# Patient Record
Sex: Female | Born: 1935 | Race: White | Hispanic: No | Smoking: Former smoker
Health system: Southern US, Community
[De-identification: ages and names within clinical notes are randomized; demographics above are authoritative.]

## PROBLEM LIST (undated history)

## (undated) DIAGNOSIS — I129 Hypertensive chronic kidney disease with stage 1 through stage 4 chronic kidney disease, or unspecified chronic kidney disease: Secondary | ICD-10-CM

## (undated) DIAGNOSIS — I10 Essential (primary) hypertension: Secondary | ICD-10-CM

## (undated) DIAGNOSIS — D649 Anemia, unspecified: Secondary | ICD-10-CM

## (undated) DIAGNOSIS — E041 Nontoxic single thyroid nodule: Secondary | ICD-10-CM

## (undated) DIAGNOSIS — J449 Chronic obstructive pulmonary disease, unspecified: Secondary | ICD-10-CM

## (undated) DIAGNOSIS — I679 Cerebrovascular disease, unspecified: Secondary | ICD-10-CM

## (undated) DIAGNOSIS — J9610 Chronic respiratory failure, unspecified whether with hypoxia or hypercapnia: Secondary | ICD-10-CM

## (undated) DIAGNOSIS — I2510 Atherosclerotic heart disease of native coronary artery without angina pectoris: Secondary | ICD-10-CM

## (undated) DIAGNOSIS — I779 Disorder of arteries and arterioles, unspecified: Secondary | ICD-10-CM

## (undated) DIAGNOSIS — Z86010 Personal history of colonic polyps: Secondary | ICD-10-CM

## (undated) DIAGNOSIS — E785 Hyperlipidemia, unspecified: Secondary | ICD-10-CM

## (undated) DIAGNOSIS — K449 Diaphragmatic hernia without obstruction or gangrene: Secondary | ICD-10-CM

## (undated) DIAGNOSIS — I255 Ischemic cardiomyopathy: Secondary | ICD-10-CM

## (undated) DIAGNOSIS — I480 Paroxysmal atrial fibrillation: Secondary | ICD-10-CM

## (undated) DIAGNOSIS — I340 Nonrheumatic mitral (valve) insufficiency: Secondary | ICD-10-CM

## (undated) DIAGNOSIS — I5022 Chronic systolic (congestive) heart failure: Secondary | ICD-10-CM

## (undated) DIAGNOSIS — I719 Aortic aneurysm of unspecified site, without rupture: Secondary | ICD-10-CM

## (undated) DIAGNOSIS — H269 Unspecified cataract: Secondary | ICD-10-CM

## (undated) DIAGNOSIS — I110 Hypertensive heart disease with heart failure: Secondary | ICD-10-CM

## (undated) DIAGNOSIS — Z720 Tobacco use: Secondary | ICD-10-CM

## (undated) DIAGNOSIS — J479 Bronchiectasis, uncomplicated: Secondary | ICD-10-CM

## (undated) DIAGNOSIS — I517 Cardiomegaly: Secondary | ICD-10-CM

## (undated) DIAGNOSIS — N184 Chronic kidney disease, stage 4 (severe): Secondary | ICD-10-CM

## (undated) DIAGNOSIS — I700 Atherosclerosis of aorta: Secondary | ICD-10-CM

## (undated) HISTORY — DX: Hypertensive heart disease with heart failure: I11.0

## (undated) HISTORY — DX: Aortic aneurysm of unspecified site, without rupture: I71.9

## (undated) HISTORY — DX: Nonrheumatic mitral (valve) insufficiency: I34.0

## (undated) HISTORY — PX: OOPHORECTOMY: SHX86

## (undated) HISTORY — DX: Anemia, unspecified: D64.9

## (undated) HISTORY — DX: Cerebrovascular disease, unspecified: I67.9

## (undated) HISTORY — DX: Bronchiectasis, uncomplicated: J47.9

## (undated) HISTORY — DX: Atherosclerosis of aorta: I70.0

## (undated) HISTORY — DX: Atherosclerotic heart disease of native coronary artery without angina pectoris: I25.10

## (undated) HISTORY — DX: Essential (primary) hypertension: I10

## (undated) HISTORY — DX: Cardiomegaly: I51.7

## (undated) HISTORY — DX: Chronic obstructive pulmonary disease, unspecified: J44.9

## (undated) HISTORY — DX: Chronic kidney disease, stage 4 (severe): N18.4

## (undated) HISTORY — DX: Tobacco use: Z72.0

## (undated) HISTORY — PX: APPENDECTOMY: SHX54

## (undated) HISTORY — DX: Disorder of arteries and arterioles, unspecified: I77.9

## (undated) HISTORY — DX: Paroxysmal atrial fibrillation: I48.0

## (undated) HISTORY — PX: HEMORRHOID SURGERY: SHX153

## (undated) HISTORY — DX: Chronic systolic (congestive) heart failure: I50.22

## (undated) HISTORY — DX: Ischemic cardiomyopathy: I25.5

## (undated) HISTORY — DX: Chronic respiratory failure, unspecified whether with hypoxia or hypercapnia: J96.10

## (undated) HISTORY — PX: OTHER SURGICAL HISTORY: SHX169

## (undated) HISTORY — DX: Hyperlipidemia, unspecified: E78.5

## (undated) HISTORY — DX: Personal history of colonic polyps: Z86.010

## (undated) HISTORY — DX: Diaphragmatic hernia without obstruction or gangrene: K44.9

## (undated) HISTORY — DX: Hypertensive chronic kidney disease with stage 1 through stage 4 chronic kidney disease, or unspecified chronic kidney disease: I12.9

---

## 2004-10-17 ENCOUNTER — Emergency Department (HOSPITAL_COMMUNITY): Admission: EM | Admit: 2004-10-17 | Discharge: 2004-10-17 | Payer: Self-pay | Admitting: Family Medicine

## 2008-02-16 ENCOUNTER — Emergency Department (HOSPITAL_COMMUNITY): Admission: EM | Admit: 2008-02-16 | Discharge: 2008-02-16 | Payer: Self-pay | Admitting: Family Medicine

## 2008-07-12 HISTORY — PX: CORONARY STENT PLACEMENT: SHX1402

## 2008-07-30 ENCOUNTER — Ambulatory Visit: Payer: Self-pay | Admitting: Internal Medicine

## 2008-07-30 ENCOUNTER — Inpatient Hospital Stay (HOSPITAL_COMMUNITY): Admission: AD | Admit: 2008-07-30 | Discharge: 2008-08-03 | Payer: Self-pay | Admitting: Cardiology

## 2008-07-31 ENCOUNTER — Encounter: Payer: Self-pay | Admitting: Cardiology

## 2008-08-01 ENCOUNTER — Encounter: Payer: Self-pay | Admitting: Cardiology

## 2008-08-02 ENCOUNTER — Ambulatory Visit: Payer: Self-pay | Admitting: Thoracic Surgery (Cardiothoracic Vascular Surgery)

## 2008-08-02 ENCOUNTER — Encounter: Payer: Self-pay | Admitting: Cardiology

## 2008-08-11 DIAGNOSIS — I10 Essential (primary) hypertension: Secondary | ICD-10-CM | POA: Insufficient documentation

## 2008-08-11 DIAGNOSIS — F17200 Nicotine dependence, unspecified, uncomplicated: Secondary | ICD-10-CM

## 2008-08-22 ENCOUNTER — Encounter: Payer: Self-pay | Admitting: Physician Assistant

## 2008-08-22 ENCOUNTER — Ambulatory Visit: Payer: Self-pay | Admitting: Cardiology

## 2008-08-22 DIAGNOSIS — E041 Nontoxic single thyroid nodule: Secondary | ICD-10-CM

## 2008-08-22 DIAGNOSIS — I080 Rheumatic disorders of both mitral and aortic valves: Secondary | ICD-10-CM | POA: Insufficient documentation

## 2008-09-07 ENCOUNTER — Ambulatory Visit: Payer: Self-pay | Admitting: Thoracic Surgery (Cardiothoracic Vascular Surgery)

## 2008-09-12 HISTORY — PX: CORONARY ARTERY BYPASS GRAFT: SHX141

## 2008-09-14 ENCOUNTER — Telehealth: Payer: Self-pay | Admitting: Cardiology

## 2008-09-24 ENCOUNTER — Encounter: Payer: Self-pay | Admitting: Thoracic Surgery (Cardiothoracic Vascular Surgery)

## 2008-09-24 ENCOUNTER — Ambulatory Visit (HOSPITAL_COMMUNITY)
Admission: RE | Admit: 2008-09-24 | Discharge: 2008-09-24 | Payer: Self-pay | Admitting: Thoracic Surgery (Cardiothoracic Vascular Surgery)

## 2008-09-26 ENCOUNTER — Ambulatory Visit: Payer: Self-pay | Admitting: Gastroenterology

## 2008-09-26 ENCOUNTER — Inpatient Hospital Stay (HOSPITAL_COMMUNITY)
Admission: RE | Admit: 2008-09-26 | Discharge: 2008-10-12 | Payer: Self-pay | Admitting: Thoracic Surgery (Cardiothoracic Vascular Surgery)

## 2008-09-26 ENCOUNTER — Encounter: Payer: Self-pay | Admitting: Thoracic Surgery (Cardiothoracic Vascular Surgery)

## 2008-09-26 ENCOUNTER — Ambulatory Visit: Payer: Self-pay | Admitting: Cardiology

## 2008-09-26 ENCOUNTER — Ambulatory Visit: Payer: Self-pay | Admitting: Thoracic Surgery (Cardiothoracic Vascular Surgery)

## 2008-10-02 ENCOUNTER — Encounter: Payer: Self-pay | Admitting: Thoracic Surgery (Cardiothoracic Vascular Surgery)

## 2008-10-04 ENCOUNTER — Encounter: Payer: Self-pay | Admitting: Gastroenterology

## 2008-10-31 ENCOUNTER — Inpatient Hospital Stay (HOSPITAL_COMMUNITY): Admission: EM | Admit: 2008-10-31 | Discharge: 2008-11-03 | Payer: Self-pay | Admitting: Emergency Medicine

## 2008-11-01 ENCOUNTER — Ambulatory Visit: Payer: Self-pay | Admitting: Internal Medicine

## 2008-11-01 ENCOUNTER — Encounter (INDEPENDENT_AMBULATORY_CARE_PROVIDER_SITE_OTHER): Payer: Self-pay | Admitting: Diagnostic Radiology

## 2008-11-01 ENCOUNTER — Telehealth: Payer: Self-pay | Admitting: Cardiology

## 2008-11-01 ENCOUNTER — Encounter (HOSPITAL_COMMUNITY): Payer: Self-pay | Admitting: Emergency Medicine

## 2008-11-01 ENCOUNTER — Encounter: Payer: Self-pay | Admitting: Cardiology

## 2008-11-02 ENCOUNTER — Encounter (INDEPENDENT_AMBULATORY_CARE_PROVIDER_SITE_OTHER): Payer: Self-pay | Admitting: *Deleted

## 2008-11-02 ENCOUNTER — Encounter: Payer: Self-pay | Admitting: Cardiology

## 2008-11-03 ENCOUNTER — Encounter: Payer: Self-pay | Admitting: Cardiology

## 2008-11-06 ENCOUNTER — Telehealth: Payer: Self-pay | Admitting: Cardiology

## 2008-11-09 ENCOUNTER — Emergency Department (HOSPITAL_COMMUNITY): Admission: EM | Admit: 2008-11-09 | Discharge: 2008-11-09 | Payer: Self-pay | Admitting: Emergency Medicine

## 2008-11-12 ENCOUNTER — Telehealth: Payer: Self-pay | Admitting: Gastroenterology

## 2008-11-12 ENCOUNTER — Emergency Department (HOSPITAL_COMMUNITY): Admission: EM | Admit: 2008-11-12 | Discharge: 2008-11-12 | Payer: Self-pay | Admitting: Emergency Medicine

## 2008-11-15 ENCOUNTER — Encounter: Payer: Self-pay | Admitting: Cardiology

## 2008-11-23 ENCOUNTER — Encounter (INDEPENDENT_AMBULATORY_CARE_PROVIDER_SITE_OTHER): Payer: Self-pay | Admitting: *Deleted

## 2008-11-23 ENCOUNTER — Encounter: Payer: Self-pay | Admitting: Cardiology

## 2008-11-28 ENCOUNTER — Ambulatory Visit: Payer: Self-pay | Admitting: Cardiovascular Disease

## 2008-11-28 DIAGNOSIS — J90 Pleural effusion, not elsewhere classified: Secondary | ICD-10-CM | POA: Insufficient documentation

## 2008-12-05 ENCOUNTER — Encounter: Payer: Self-pay | Admitting: Cardiology

## 2008-12-05 ENCOUNTER — Other Ambulatory Visit: Admission: RE | Admit: 2008-12-05 | Discharge: 2008-12-05 | Payer: Self-pay | Admitting: Interventional Radiology

## 2008-12-05 ENCOUNTER — Encounter: Admission: RE | Admit: 2008-12-05 | Discharge: 2008-12-05 | Payer: Self-pay | Admitting: Cardiology

## 2008-12-10 ENCOUNTER — Encounter: Payer: Self-pay | Admitting: Cardiology

## 2008-12-20 ENCOUNTER — Telehealth: Payer: Self-pay | Admitting: Cardiology

## 2008-12-28 ENCOUNTER — Telehealth: Payer: Self-pay | Admitting: Cardiology

## 2009-01-28 ENCOUNTER — Ambulatory Visit: Payer: Self-pay | Admitting: Cardiology

## 2009-01-29 ENCOUNTER — Encounter: Payer: Self-pay | Admitting: Cardiology

## 2009-01-30 LAB — CONVERTED CEMR LAB
ALT: 12 units/L (ref 0–35)
AST: 20 units/L (ref 0–37)
Albumin: 3.5 g/dL (ref 3.5–5.2)
Alkaline Phosphatase: 69 units/L (ref 39–117)
BUN: 32 mg/dL — ABNORMAL HIGH (ref 6–23)
Basophils Absolute: 0 10*3/uL (ref 0.0–0.1)
CO2: 25 meq/L (ref 19–32)
Chloride: 106 meq/L (ref 96–112)
Eosinophils Relative: 2 % (ref 0–5)
GFR calc non Af Amer: 38.48 mL/min (ref >60)
Glucose, Bld: 86 mg/dL (ref 70–99)
Lymphocytes Relative: 30 % (ref 12–46)
Lymphs Abs: 2.1 10*3/uL (ref 0.7–4.0)
Neutro Abs: 4.2 10*3/uL (ref 1.7–7.7)
Neutrophils Relative %: 60 % (ref 43–77)
Platelets: 524 10*3/uL — ABNORMAL HIGH (ref 150–400)
Potassium: 5.1 meq/L (ref 3.5–5.1)
RDW: 14.9 % (ref 11.5–15.5)
Sodium: 143 meq/L (ref 135–145)
Total Protein: 7.3 g/dL (ref 6.0–8.3)
WBC: 7 10*3/uL (ref 4.0–10.5)

## 2009-02-01 ENCOUNTER — Ambulatory Visit: Payer: Self-pay | Admitting: Cardiology

## 2009-02-01 ENCOUNTER — Ambulatory Visit: Payer: Self-pay

## 2009-02-04 ENCOUNTER — Ambulatory Visit: Payer: Self-pay | Admitting: Cardiology

## 2009-02-05 ENCOUNTER — Encounter: Payer: Self-pay | Admitting: Cardiovascular Disease

## 2009-02-06 ENCOUNTER — Telehealth (INDEPENDENT_AMBULATORY_CARE_PROVIDER_SITE_OTHER): Payer: Self-pay | Admitting: *Deleted

## 2009-02-07 LAB — CONVERTED CEMR LAB
Basophils Absolute: 0 10*3/uL (ref 0.0–0.1)
Basophils Relative: 0 % (ref 0–1)
CO2: 24 meq/L (ref 19–32)
Calcium: 9.3 mg/dL (ref 8.4–10.5)
Hemoglobin: 11.6 g/dL — ABNORMAL LOW (ref 12.0–15.0)
Lymphocytes Relative: 31 % (ref 12–46)
MCHC: 32 g/dL (ref 30.0–36.0)
Monocytes Absolute: 0.5 10*3/uL (ref 0.1–1.0)
Monocytes Relative: 6 % (ref 3–12)
Neutro Abs: 5.3 10*3/uL (ref 1.7–7.7)
Neutrophils Relative %: 62 % (ref 43–77)
OCCULT 1: NEGATIVE
OCCULT 3: NEGATIVE
OCCULT 4: NEGATIVE
OCCULT 5: NEGATIVE
Potassium: 5.2 meq/L (ref 3.5–5.3)
RBC: 4.09 M/uL (ref 3.87–5.11)
Sodium: 139 meq/L (ref 135–145)

## 2009-02-08 ENCOUNTER — Encounter: Payer: Self-pay | Admitting: Cardiovascular Disease

## 2009-02-19 ENCOUNTER — Ambulatory Visit: Payer: Self-pay | Admitting: Cardiology

## 2009-02-19 DIAGNOSIS — E785 Hyperlipidemia, unspecified: Secondary | ICD-10-CM

## 2009-02-19 LAB — CONVERTED CEMR LAB
ALT: 13 units/L (ref 0–35)
Albumin: 3.7 g/dL (ref 3.5–5.2)
BUN: 20 mg/dL (ref 6–23)
Basophils Absolute: 0 10*3/uL (ref 0.0–0.1)
Basophils Relative: 0.2 % (ref 0.0–3.0)
CO2: 30 meq/L (ref 19–32)
Calcium: 9.6 mg/dL (ref 8.4–10.5)
Chloride: 108 meq/L (ref 96–112)
Cholesterol: 170 mg/dL (ref 0–200)
Creatinine, Ser: 1.2 mg/dL (ref 0.4–1.2)
Eosinophils Absolute: 0.2 10*3/uL (ref 0.0–0.7)
Glucose, Bld: 97 mg/dL (ref 70–99)
HDL: 71.3 mg/dL (ref >39.00)
MCHC: 32.9 g/dL (ref 30.0–36.0)
MCV: 90 fL (ref 78.0–100.0)
Monocytes Absolute: 0.7 10*3/uL (ref 0.1–1.0)
Neutrophils Relative %: 59.3 % (ref 43.0–77.0)
Platelets: 307 10*3/uL (ref 150.0–400.0)
RBC: 4.14 M/uL (ref 3.87–5.11)
RDW: 15.6 % — ABNORMAL HIGH (ref 11.5–14.6)
Total Protein: 6.7 g/dL (ref 6.0–8.3)
Triglycerides: 134 mg/dL (ref 0.0–149.0)

## 2009-02-20 ENCOUNTER — Ambulatory Visit: Payer: Self-pay | Admitting: Cardiology

## 2009-02-22 ENCOUNTER — Telehealth: Payer: Self-pay | Admitting: Cardiology

## 2009-02-22 LAB — CONVERTED CEMR LAB
BUN: 20 mg/dL (ref 6–23)
GFR calc non Af Amer: 45.96 mL/min (ref >60)
Potassium: 5.2 meq/L — ABNORMAL HIGH (ref 3.5–5.1)

## 2009-02-26 ENCOUNTER — Ambulatory Visit: Payer: Self-pay | Admitting: Cardiovascular Disease

## 2009-02-26 DIAGNOSIS — I70219 Atherosclerosis of native arteries of extremities with intermittent claudication, unspecified extremity: Secondary | ICD-10-CM

## 2009-02-28 LAB — CONVERTED CEMR LAB
CO2: 30 meq/L (ref 19–32)
Calcium: 9.5 mg/dL (ref 8.4–10.5)
GFR calc non Af Amer: 38.47 mL/min (ref >60)
Potassium: 4.8 meq/L (ref 3.5–5.1)
Sodium: 139 meq/L (ref 135–145)

## 2009-03-15 ENCOUNTER — Ambulatory Visit: Payer: Self-pay | Admitting: Cardiology

## 2009-03-22 LAB — CONVERTED CEMR LAB
BUN: 26 mg/dL — ABNORMAL HIGH (ref 6–23)
Chloride: 105 meq/L (ref 96–112)
Potassium: 4.4 meq/L (ref 3.5–5.1)

## 2009-04-22 ENCOUNTER — Ambulatory Visit: Payer: Self-pay | Admitting: Cardiology

## 2009-04-22 DIAGNOSIS — I2510 Atherosclerotic heart disease of native coronary artery without angina pectoris: Secondary | ICD-10-CM | POA: Insufficient documentation

## 2009-04-23 LAB — CONVERTED CEMR LAB
AST: 32 units/L (ref 0–37)
Albumin: 4.1 g/dL (ref 3.5–5.2)
Alkaline Phosphatase: 58 units/L (ref 39–117)
CO2: 31 meq/L (ref 19–32)
Glucose, Bld: 97 mg/dL (ref 70–99)
Potassium: 5.8 meq/L — ABNORMAL HIGH (ref 3.5–5.1)
Sodium: 142 meq/L (ref 135–145)
TSH: 1.98 microintl units/mL (ref 0.35–5.50)
Total CK: 68 units/L (ref 7–177)
Total Protein: 7.2 g/dL (ref 6.0–8.3)

## 2009-04-24 ENCOUNTER — Ambulatory Visit: Payer: Self-pay | Admitting: Cardiology

## 2009-04-27 ENCOUNTER — Ambulatory Visit: Payer: Self-pay | Admitting: Cardiovascular Disease

## 2009-04-27 ENCOUNTER — Encounter: Payer: Self-pay | Admitting: Cardiovascular Disease

## 2009-04-27 ENCOUNTER — Inpatient Hospital Stay (HOSPITAL_COMMUNITY): Admission: EM | Admit: 2009-04-27 | Discharge: 2009-04-30 | Payer: Self-pay | Admitting: Emergency Medicine

## 2009-04-27 ENCOUNTER — Ambulatory Visit: Payer: Self-pay | Admitting: Cardiology

## 2009-04-28 LAB — CONVERTED CEMR LAB
Chloride: 104 meq/L (ref 96–112)
Creatinine, Ser: 1.3 mg/dL — ABNORMAL HIGH (ref 0.4–1.2)
Potassium: 5.1 meq/L (ref 3.5–5.1)

## 2009-05-08 ENCOUNTER — Ambulatory Visit: Payer: Self-pay | Admitting: Cardiology

## 2009-05-09 LAB — CONVERTED CEMR LAB
CO2: 32 meq/L (ref 19–32)
GFR calc non Af Amer: 41.88 mL/min (ref >60)
Glucose, Bld: 96 mg/dL (ref 70–99)
Potassium: 5.4 meq/L — ABNORMAL HIGH (ref 3.5–5.1)
Sodium: 142 meq/L (ref 135–145)

## 2009-05-13 ENCOUNTER — Ambulatory Visit: Payer: Self-pay | Admitting: Cardiology

## 2009-05-16 LAB — CONVERTED CEMR LAB
BUN: 26 mg/dL — ABNORMAL HIGH (ref 6–23)
Chloride: 106 meq/L (ref 96–112)
Potassium: 4.9 meq/L (ref 3.5–5.1)

## 2009-05-27 ENCOUNTER — Ambulatory Visit: Payer: Self-pay | Admitting: Cardiology

## 2009-05-29 LAB — CONVERTED CEMR LAB
CO2: 31 meq/L (ref 19–32)
Calcium: 9.2 mg/dL (ref 8.4–10.5)
Chloride: 105 meq/L (ref 96–112)
Glucose, Bld: 84 mg/dL (ref 70–99)
Sodium: 142 meq/L (ref 135–145)

## 2009-06-12 ENCOUNTER — Encounter: Payer: Self-pay | Admitting: Cardiology

## 2009-06-12 DIAGNOSIS — I739 Peripheral vascular disease, unspecified: Secondary | ICD-10-CM

## 2009-07-26 ENCOUNTER — Ambulatory Visit: Payer: Self-pay | Admitting: Cardiology

## 2009-07-26 ENCOUNTER — Ambulatory Visit: Payer: Self-pay

## 2009-07-26 DIAGNOSIS — I359 Nonrheumatic aortic valve disorder, unspecified: Secondary | ICD-10-CM

## 2009-08-02 LAB — CONVERTED CEMR LAB
BUN: 28 mg/dL — ABNORMAL HIGH (ref 6–23)
CO2: 28 meq/L (ref 19–32)
Chloride: 103 meq/L (ref 96–112)
Creatinine, Ser: 1.34 mg/dL — ABNORMAL HIGH (ref 0.40–1.20)
Glucose, Bld: 94 mg/dL (ref 70–99)

## 2009-08-06 ENCOUNTER — Encounter: Payer: Self-pay | Admitting: Cardiology

## 2009-08-09 ENCOUNTER — Ambulatory Visit: Payer: Self-pay | Admitting: Cardiology

## 2009-08-16 ENCOUNTER — Telehealth: Payer: Self-pay | Admitting: Cardiology

## 2009-08-16 LAB — CONVERTED CEMR LAB
BUN: 34 mg/dL — ABNORMAL HIGH (ref 6–23)
Calcium: 9.1 mg/dL (ref 8.4–10.5)
Creatinine, Ser: 1.4 mg/dL — ABNORMAL HIGH (ref 0.4–1.2)
GFR calc non Af Amer: 39.73 mL/min (ref >60)
Potassium: 4.5 meq/L (ref 3.5–5.1)

## 2009-08-22 ENCOUNTER — Ambulatory Visit: Payer: Self-pay | Admitting: Cardiology

## 2009-08-22 ENCOUNTER — Ambulatory Visit: Payer: Self-pay | Admitting: Vascular Surgery

## 2009-08-26 LAB — CONVERTED CEMR LAB
Calcium: 9.7 mg/dL (ref 8.4–10.5)
Creatinine, Ser: 1.4 mg/dL — ABNORMAL HIGH (ref 0.4–1.2)
GFR calc non Af Amer: 38.74 mL/min (ref >60)
Sodium: 144 meq/L (ref 135–145)

## 2009-10-22 ENCOUNTER — Ambulatory Visit: Payer: Self-pay | Admitting: Cardiology

## 2009-10-23 ENCOUNTER — Encounter: Payer: Self-pay | Admitting: Cardiology

## 2009-10-24 LAB — CONVERTED CEMR LAB
Chloride: 103 meq/L (ref 96–112)
GFR calc non Af Amer: 18.15 mL/min (ref >60)
Potassium: 4.9 meq/L (ref 3.5–5.1)
Sodium: 137 meq/L (ref 135–145)

## 2009-10-25 ENCOUNTER — Ambulatory Visit: Payer: Self-pay | Admitting: Cardiology

## 2009-10-25 LAB — CONVERTED CEMR LAB
CO2: 23 meq/L (ref 19–32)
Glucose, Bld: 91 mg/dL (ref 70–99)
Ketones, ur: NEGATIVE mg/dL
Potassium: 4.5 meq/L (ref 3.5–5.1)
Sodium: 139 meq/L (ref 135–145)
Specific Gravity, Urine: 1.015 (ref 1.000–1.030)
Urine Glucose: NEGATIVE mg/dL
Urobilinogen, UA: 0.2 (ref 0.0–1.0)
pH: 5.5 (ref 5.0–8.0)

## 2009-10-30 ENCOUNTER — Ambulatory Visit: Payer: Self-pay | Admitting: Cardiology

## 2009-10-30 ENCOUNTER — Ambulatory Visit: Payer: Self-pay

## 2009-11-04 ENCOUNTER — Encounter: Payer: Self-pay | Admitting: Cardiology

## 2009-11-06 LAB — CONVERTED CEMR LAB
BUN: 29 mg/dL — ABNORMAL HIGH (ref 6–23)
Calcium: 9.5 mg/dL (ref 8.4–10.5)
GFR calc non Af Amer: 20.23 mL/min (ref >60)
Glucose, Bld: 89 mg/dL (ref 70–99)
Potassium: 4.7 meq/L (ref 3.5–5.1)

## 2009-11-15 ENCOUNTER — Encounter (HOSPITAL_COMMUNITY)
Admission: RE | Admit: 2009-11-15 | Discharge: 2010-02-11 | Payer: Self-pay | Source: Home / Self Care | Attending: Nephrology | Admitting: Nephrology

## 2009-11-15 ENCOUNTER — Ambulatory Visit: Payer: Self-pay | Admitting: Internal Medicine

## 2009-11-16 ENCOUNTER — Inpatient Hospital Stay (HOSPITAL_COMMUNITY): Admission: EM | Admit: 2009-11-16 | Discharge: 2009-11-19 | Payer: Self-pay | Admitting: Emergency Medicine

## 2009-11-29 ENCOUNTER — Ambulatory Visit: Payer: Self-pay | Admitting: Cardiology

## 2009-11-29 ENCOUNTER — Encounter: Payer: Self-pay | Admitting: Physician Assistant

## 2009-11-29 ENCOUNTER — Ambulatory Visit: Payer: Self-pay | Admitting: Physician Assistant

## 2009-11-29 DIAGNOSIS — I5042 Chronic combined systolic (congestive) and diastolic (congestive) heart failure: Secondary | ICD-10-CM | POA: Insufficient documentation

## 2009-12-02 LAB — CONVERTED CEMR LAB
CO2: 28 meq/L (ref 19–32)
Calcium: 9.7 mg/dL (ref 8.4–10.5)
Creatinine, Ser: 2.3 mg/dL — ABNORMAL HIGH (ref 0.4–1.2)
GFR calc non Af Amer: 21.22 mL/min (ref >60)
Sodium: 142 meq/L (ref 135–145)

## 2009-12-17 ENCOUNTER — Telehealth (INDEPENDENT_AMBULATORY_CARE_PROVIDER_SITE_OTHER): Payer: Self-pay | Admitting: *Deleted

## 2009-12-19 ENCOUNTER — Encounter: Payer: Self-pay | Admitting: Cardiology

## 2010-01-27 LAB — POCT HEMOGLOBIN-HEMACUE: Hemoglobin: 11.8 g/dL — ABNORMAL LOW (ref 12.0–15.0)

## 2010-01-28 ENCOUNTER — Ambulatory Visit
Admission: RE | Admit: 2010-01-28 | Discharge: 2010-01-28 | Payer: Self-pay | Source: Home / Self Care | Attending: Cardiology | Admitting: Cardiology

## 2010-02-02 ENCOUNTER — Encounter: Payer: Self-pay | Admitting: Thoracic Surgery (Cardiothoracic Vascular Surgery)

## 2010-02-11 NOTE — Miscellaneous (Signed)
Summary: Orders Update  Clinical Lists Changes  Orders: Added new Test order of T-2 View CXR (71020TC) - Signed 

## 2010-02-11 NOTE — Progress Notes (Signed)
Summary: Results  Phone Note Outgoing Call   Call placed by: Julieta Gutting, RN, BSN,  August 16, 2009 5:44 PM Call placed to: Patient Summary of Call: I spoke with the pt and made her aware of Carotid and BMP results.   The pt cannot cut her furosemide tablets because they are to small.  I instructed the pt to take her Furosemide 20mg  every third day and recheck BMP on 08/23/09.  The pt will also need referral to [facility] for carotid stenosis. Initial call taken by: Julieta Gutting, RN, BSN,  August 16, 2009 5:44 PM    New/Updated Medications: FUROSEMIDE 20 MG TABS (FUROSEMIDE) Take one tablet by mouth every third day

## 2010-02-11 NOTE — Letter (Signed)
Summary: [facility] Physician Order  [facility] Physician Order   Imported By: Roderic Ovens 11/23/2008 11:46:31  _____________________________________________________________________  External Attachment:    Type:   Image     Comment:   External Document

## 2010-02-11 NOTE — Progress Notes (Signed)
Summary: refill  Phone Note Refill Request Message from:  Patient on December 17, 2009 1:46 PM  Refills Requested: Medication #1:  LASIX 20 MG TABS Take 1 tablet by mouth once a day [facility]  Initial call taken by: Judie Grieve,  December 17, 2009 1:46 PM  Follow-up for Phone Call        Rx faxed to pharmacy. Vikki Ports  December 18, 2009 8:25 AM     Prescriptions: LASIX 20 MG TABS (FUROSEMIDE) Take 1 tablet by mouth once a day  #30 x 6   Entered by:   Vikki Ports   Authorized by:   Ronaldo Miyamoto, MD, [facility]   Signed by:   Vikki Ports on 12/18/2009   Method used:   Faxed to ...       [facility]       [address]       [place], [place]  [address]       Ph: [phone]       Fax: [phone]   RxID:   [id number]

## 2010-02-11 NOTE — Miscellaneous (Signed)
Summary: Orders Update  Clinical Lists Changes  Orders: Added new Test order of T-Basic Metabolic Panel ([id number]) - Signed Added new Test order of T-CBC w/Diff ([id number]) - Signed 

## 2010-02-11 NOTE — Letter (Signed)
Summary: [facility]  [facility]   Imported By: Marylou Mccoy 03/27/2009 09:28:46  _____________________________________________________________________  External Attachment:    Type:   Image     Comment:   External Document

## 2010-02-11 NOTE — Miscellaneous (Signed)
Summary: Orders Update  Clinical Lists Changes  Problems: Added new problem of RENAL INSUFFICIENCY (ICD-588.9) Orders: Added new Test order of Renal Artery Duplex (Renal Artery Duplex) - Signed 

## 2010-02-11 NOTE — Assessment & Plan Note (Signed)
Summary: abnormal LEA   Visit Type:  Initial Consult Primary Provider:  Ronaldo Miyamoto, MD, [facility]  CC:  New PV evaluation. Abnormal LEA.  History of Present Illness: 77year-old woman with lower extremity PAD presenting for initial evaluation. She complains of longstanding leg pain with ambulation.   She had 'veins stripped' in the 1970's, and has had leg problems for several decades. However, she only reports leg pain with ambulation since her MI and CABG last year. She has to rest when she goes to the grocery store to shop. She can walk inside but thinks she would have to stop and rest with one city block of walking. Pain is located in her distal calf and ankle area and is a 'tired' feeling. She had leg pain prior to CABG, but much worse since that time.  Leg pain is improved with the use of support hose. No ulcerations or rest pain.  Lower extremity ABI/Duplex in January 2011 showed total right SFA occlusion and severe segmental stenoses in the left SFA. Both ABI's were moderately to severely reduced 0.41 on the right and 0.47 on the left.  Current Medications (verified): 1)  Metoprolol Tartrate 50 Mg Tabs (Metoprolol Tartrate) .... Take One Tablet By Mouth Twice A Day 2)  Crestor 40 Mg Tabs (Rosuvastatin Calcium) .... Take One Daily 3)  Nitroglycerin 0.4 Mg Subl (Nitroglycerin) .... Take As Needed 4)  Aspirin 81 Mg Tbec (Aspirin) .... Take One Tablet By Mouth Daily 5)  Amlodipine Besylate 5 Mg Tabs (Amlodipine Besylate) .... Take One Tablet By Mouth Daily 6)  Miralax  Powd (Polyethylene Glycol 3350) .... As Needed  Allergies (verified): No Known Drug Allergies  Past History:  Past medical, surgical, family and social histories (including risk factors) reviewed, and no changes noted (except as noted below).  Past Medical History: Current Problems:  CAD (ICD-414.00)-/code STEMI with an acute inferior posterior  ST elevation myocardial infarction HYPERTENSION (ICD-401.9) HIATAL  HERNIA (ICD-553.3) Large TOBACCO ABUSE (ICD-305.1) LEG PAIN (ICD-729.5)  Abnormal chest CT showing ectasia of the ascending aorta without focal aneurysm.  Abnormal thyroid ultrasound dominant right inferior thyroid cold nodule.  Presumed COPD. Peripheral arterial disease with claudication - bilateral SFA disease with ABI's 0.4-0.5 bilaterally  Past Surgical History: Reviewed history from 08/11/2008 and no changes required.  percutaneous coronary intervention  laprotomy for bowel obstruction   Family History: Reviewed history from 08/11/2008 and no changes required.  There is an extensive family history of coronary artery   disease.  Her mother had significant coronary artery disease as well as   her brother.  There are several other family members with coronary   disease.   Social History: Reviewed history from 08/11/2008 and no changes required.   She is lives in [place].  She is retired.  She   is widowed.  She has a history of tobacco use but does not quantify.   She said she stopped smoking in 2010 at the time of her MI.    Review of Systems       Negative except as per HPI   Vital Signs:  Patient profile:   77 year old female Height:      69 inches Weight:      123.25 pounds BMI:     18.27 Pulse rate:   64 / minute Pulse rhythm:   regular Resp:     18 per minute BP sitting:   141 / 67  (left arm) Cuff size:   regular  Vitals  Entered By: Vikki Ports (February 26, 2009 2:35 PM)  Serial Vital Signs/Assessments:  Time      Position  BP       Pulse  Resp  Temp     By           R Arm     150/70                         Vikki Ports   Physical Exam  General:  Pt is alert and oriented, thin, elderly woman, in no acute distress. HEENT: normal Neck: normal carotid upstrokes with bilateral bruits left > right, JVP normal Lungs: CTA CV: RRR without murmur or gallop Abd: soft, NT, positive BS, no bruit, no organomegaly Ext: no clubbing, cyanosis, or edema.  fem pulses 2+=, DP and PT pulses not palpable Skin: warm and dry without rash    Impression & Recommendations:  Problem # 1:  ATHEROSCLEROSIS W /INT CLAUDICATION (ICD-440.21) Pt has diffuse bilateral SFA disease with stable but lifestyle limiting intermittent claudication. She has no signs of resting limb ischemia. Based on diffuse disease in both SFA's, she has only 2 revascularization options: bilateral fem-pop or bilateral SFA stenting (likely overlapping stents). Neither of these options are attractive considering her advanced age, recent recovery from CABG, and absence of critical ischemia. I discussed the use of pletal, but she is not interested after hearing the side-effect profile. Therefore, would continue with risk reduction measures and I reviewed instructions for a walking program. Will see her back in one year with ABI's at the time of her f/u visit. We discussed symptoms of worsening limb ischemia and she knows to contact our office if this occurs.  Other Orders: TLB-BMP (Basic Metabolic Panel-BMET) (80048-METABOL)  Patient Instructions: 1)  Your physician wants you to follow-up in:  1 YEAR. You will receive a reminder letter in the mail two months in advance. If you don't receive a letter, please call our office to schedule the follow-up appointment. 2)  Your physician has requested that you have an ankle brachial index (ABI) in 1 YEAR. During this test an ultrasound and blood pressure cuff are used to evaluate the arteries that supply the arms and legs with blood. Allow thirty minutes for this exam. There are no restrictions or special instructions. 3)  Your physician recommends that you continue on your current medications as directed. Please refer to the Current Medication list given to you today.  Appended Document: abnormal LEA Document reviewed. Appreciate the consultation.  TS

## 2010-02-11 NOTE — Miscellaneous (Signed)
Summary: med change  Clinical Lists Changes  Medications: Changed medication from FUROSEMIDE 20 MG TABS (FUROSEMIDE) Take one tablet by mouth daily. to FUROSEMIDE 20 MG TABS (FUROSEMIDE) Take one tablet by every other day

## 2010-02-11 NOTE — Assessment & Plan Note (Signed)
Summary: f65m   Visit Type:  3 months follow up Primary Provider:  Ronaldo Miyamoto, MD, [facility]  CC:  No cardiac complaints.  History of Present Illness: Overall she is doing well.  She has gained a few pounds.  Not smoking at all.  She is not doing too much, cleaning and doing a little bit of walking.  When she walks her legs get tired.  They get fatigued.  Sometimes her feet feel numb.  Her muscles in her arms bother her as well. Her hair is no longer falling out.   She was seen by Dr. Edilia Bo, and he recommended revisit when carotid was greater than 80%.    Current Medications (verified): 1)  Metoprolol Tartrate 50 Mg Tabs (Metoprolol Tartrate) .... Take One Tablet By Mouth Twice A Day 2)  Crestor 40 Mg Tabs (Rosuvastatin Calcium) .... Take One Daily 3)  Nitroglycerin 0.4 Mg Subl (Nitroglycerin) .... Take As Needed 4)  Aspirin 81 Mg Tbec (Aspirin) .... Take One Tablet By Mouth Daily 5)  Miralax  Powd (Polyethylene Glycol 3350) .... As Needed 6)  Furosemide 20 Mg Tabs (Furosemide) .... Take One Tablet By Mouth Every Third Day 7)  Amlodipine Besylate 5 Mg Tabs (Amlodipine Besylate) .... Take One Tablet By Mouth Daily  Allergies (verified): No Known Drug Allergies  Past History:  Past Surgical History:  percutaneous coronary intervention  laprotomy for bowel obstruction  CABG, aortic thoracic anuerysm resection 2010  Vital Signs:  Patient profile:   77 year old female Height:      69 inches Weight:      140.75 pounds BMI:     20.86 Pulse rate:   66 / minute Pulse rhythm:   regular Resp:     18 per minute BP sitting:   136 / 70  (left arm) Cuff size:   large  Vitals Entered By: Vikki Ports (October 22, 2009 1:53 PM)  Physical Exam  General:  Well developed, well nourished, in no acute distress. Head:  normocephalic and atraumatic Eyes:  PERRLA/EOM intact; conjunctiva and lids normal. Lungs:  Prolonged expiration. Heart:  PMI non displaced.  Normal S1 and S2.  Soft  SEM.  No Diastolic murmur.  Pulses:  pulses normal in all 4 extremities Extremities:  No clubbing or cyanosis. Neurologic:  Alert and oriented x 3.   EKG  Procedure date:  10/22/2009  Findings:      NSR.  LVH with repole changes.   Impression & Recommendations:  Problem # 1:  CAD, ARTERY BYPASS GRAFT (ICD-414.04) stable no chest pain.  Continue medical therapy Her updated medication list for this problem includes:    Metoprolol Tartrate 50 Mg Tabs (Metoprolol tartrate) .Marland Kitchen... Take one tablet by mouth twice a day    Nitroglycerin 0.4 Mg Subl (Nitroglycerin) .Marland Kitchen... Take as needed    Aspirin 81 Mg Tbec (Aspirin) .Marland Kitchen... Take one tablet by mouth daily    Amlodipine Besylate 5 Mg Tabs (Amlodipine besylate) .Marland Kitchen... Take one tablet by mouth daily  Problem # 2:  HYPERCHOLESTEROLEMIA  IIA (ICD-272.0) Has some symptoms with arm weakness and perhaps leg fatigue.   Not at target, even on high dose Crestor.  CPK has been normal.  Will try some fish oil capsules,and also co Q 10 100mg  once daily.   Will recheck in three months. Her updated medication list for this problem includes:    Crestor 40 Mg Tabs (Rosuvastatin calcium) .Marland Kitchen... Take one daily  Problem # 3:  CAROTID ARTERY STENOSIS (ICD-433.10)  Her stenosis is moderate, and will need followup with serial doppler exams.  Has been seen by [facility], and will need follow up after 80%.   Her updated medication list for this problem includes:    Aspirin 81 Mg Tbec (Aspirin) .Marland Kitchen... Take one tablet by mouth daily  Orders: EKG w/ Interpretation (93000) TLB-BMP (Basic Metabolic Panel-BMET) (80048-METABOL)  Patient Instructions: 1)  Your physician recommends that you schedule a follow-up appointment in: 3 MONTHS 2)  Your physician recommends that you have lab work today: BMP 3)  Your physician has recommended you make the following change in your medication: START Fish oil 2000mg  daily, START CoEnzyme Q10 100mg  once a day

## 2010-02-11 NOTE — Progress Notes (Signed)
  Phone Note From Other Clinic   Caller: Christy/Dr.Altimer Details for Reason: Pt.Information Initial call taken by: Denny Peon    Faxed Cardiac over to [phone] [facility]  February 06, 2009 8:36 AM

## 2010-02-11 NOTE — Assessment & Plan Note (Signed)
Summary: [facility]   Visit Type:  2 months follow up Primary Provider:  Ronaldo Miyamoto, MD, [facility]  CC:  No complains.  History of Present Illness: No chest pain, and feels pretty good overall.  Not smoking at this point.  Legs have improved.  No worsening shortness of breath.  Overall, doing great.        Current Medications (verified): 1)  Metoprolol Tartrate 50 Mg Tabs (Metoprolol Tartrate) .... Take One Tablet By Mouth Twice A Day 2)  Crestor 40 Mg Tabs (Rosuvastatin Calcium) .... Take One Daily 3)  Nitroglycerin 0.4 Mg Subl (Nitroglycerin) .... Take As Needed 4)  Aspirin 81 Mg Tbec (Aspirin) .... Take One Tablet By Mouth Daily 5)  Miralax  Powd (Polyethylene Glycol 3350) .... As Needed 6)  Furosemide 20 Mg Tabs (Furosemide) .... Take One Tablet By Mouth Daily. 7)  Amlodipine Besylate 5 Mg Tabs (Amlodipine Besylate) .... Take One Tablet By Mouth Daily  Allergies (verified): No Known Drug Allergies  Vital Signs:  Patient profile:   77 year old female Height:      69 inches Weight:      132.50 pounds BMI:     19.64 Pulse rate:   68 / minute Pulse rhythm:   regular Resp:     18 per minute BP sitting:   140 / 77  (right arm) Cuff size:   small  Vitals Entered By: Vikki Ports (June 12, 2009 2:53 PM)  Physical Exam  General:  older femaile in no distress Head:  normocephalic and atraumatic Eyes:  PERRLA/EOM intact; conjunctiva and lids normal. Ears:  TM's intact and clear with normal canals and hearing Neck:  Left bruit greater than right Lungs:  clear to a and p Heart:  PMI nondisplaced.  Normal S1 and S2.  SEM 2/6.  No DM. Abdomen:  Bowel sounds positive; abdomen soft and non-tender without masses, organomegaly, or hernias noted. No hepatosplenomegaly. Extremities:  No clubbing or cyanosis. Neurologic:  Alert and oriented x 3.   Impression & Recommendations:  Problem # 1:  CAD, ARTERY BYPASS GRAFT (ICD-414.04)  continues to do well.  Her updated  medication list for this problem includes:    Metoprolol Tartrate 50 Mg Tabs (Metoprolol tartrate) .Marland Kitchen... Take one tablet by mouth twice a day    Nitroglycerin 0.4 Mg Subl (Nitroglycerin) .Marland Kitchen... Take as needed    Aspirin 81 Mg Tbec (Aspirin) .Marland Kitchen... Take one tablet by mouth daily    Amlodipine Besylate 5 Mg Tabs (Amlodipine besylate) .Marland Kitchen... Take one tablet by mouth daily  Orders: Carotid Duplex (Carotid Duplex)  Problem # 2:  HYPERCHOLESTEROLEMIA  IIA (ICD-272.0) near targtet on treatment Her updated medication list for this problem includes:    Crestor 40 Mg Tabs (Rosuvastatin calcium) .Marland Kitchen... Take one daily  Problem # 3:  ATHEROSCLEROSIS W /INT CLAUDICATION (ICD-440.21) clauidcation is improved.  Problem # 4:  HYPERTENSION (ICD-401.9) controlled.  Her updated medication list for this problem includes:    Metoprolol Tartrate 50 Mg Tabs (Metoprolol tartrate) .Marland Kitchen... Take one tablet by mouth twice a day    Aspirin 81 Mg Tbec (Aspirin) .Marland Kitchen... Take one tablet by mouth daily    Furosemide 20 Mg Tabs (Furosemide) .Marland Kitchen... Take one tablet by mouth daily.    Amlodipine Besylate 5 Mg Tabs (Amlodipine besylate) .Marland Kitchen... Take one tablet by mouth daily  Problem # 5:  CAROTID ARTERY STENOSIS (ICD-433.10) 60-79% on right, and less on left.  Will repeat dopplers at this point in time,  and discuss in follow up.  Her updated medication list for this problem includes:    Aspirin 81 Mg Tbec (Aspirin) .Marland Kitchen... Take one tablet by mouth daily  Patient Instructions: 1)  Your physician recommends that you schedule a follow-up appointment in: 6 WEEKS 2)  Your physician recommends that you return for lab work in: 6 WEEKS (BMP 414.04, 272.0, 440.21, 401.9) 3)  Your physician has requested that you have a carotid duplex in 6 WEEKS. This test is an ultrasound of the carotid arteries in your neck. It looks at blood flow through these arteries that supply the brain with blood. Allow one hour for this exam. There are no restrictions  or special instructions. 4)  Your physician recommends that you continue on your current medications as directed. Please refer to the Current Medication list given to you today.

## 2010-02-11 NOTE — Letter (Signed)
Summary: [facility] New Patient Consult   [facility] New Patient Consult   Imported By: Roderic Ovens 09/27/2009 11:13:28  _____________________________________________________________________  External Attachment:    Type:   Image     Comment:   External Document

## 2010-02-11 NOTE — Assessment & Plan Note (Signed)
Summary: eph/jml   Visit Type:  Follow-up Primary Frank Pilger:  Ronaldo Miyamoto, MD, [facility]  CC:  Post-hospital.  History of Present Illness: Primary Cardiologist:  Dr. Shawnie Pons  [name] is an 77 yo female with a history of coronary artery disease, status post bypass surgery in July 2010 and an ischemic cardiomyopathy with an EF of 45-50%.  She has chronic kidney disease.  She recently presented to the hospital with acute systolic heart failure.  She was diuresed.  Her discharge weight was 131 pounds.  Her discharge creatinine was 2.77.  She returns today for followup.  Since discharge, she is doing well.  She denies any shortness of breath.  She denies dyspnea with exertion.  She denies chest pain.  She denies orthopnea, PND or pedal edema.  She denies syncope or palpitations.  Her weights at home and been stable.  She has maintained a weight of 130-131 pounds.  She does note some muscle pain from her Crestor but this seems to be tolerable.  Of note, her CPK was normal in the hospital.  She has been tracking her blood pressures at home and they have ranged 110 to 130s systolically.  She has not taken her blood pressure medicines yet today.  Current Medications (verified): 1)  Metoprolol Tartrate 25 Mg Tabs (Metoprolol Tartrate) .... Take One Tablet By Mouth Twice A Day 2)  Crestor 40 Mg Tabs (Rosuvastatin Calcium) .... Take One Daily 3)  Nitroglycerin 0.4 Mg Subl (Nitroglycerin) .... Take As Needed 4)  Aspirin 81 Mg Tbec (Aspirin) .... Take One Tablet By Mouth Daily 5)  Miralax  Powd (Polyethylene Glycol 3350) .... As Needed 6)  Lasix 20 Mg Tabs (Furosemide) .... Take 1 Tablet By Mouth Once A Day 7)  Amlodipine Besylate 5 Mg Tabs (Amlodipine Besylate) .... Take One Tablet By Mouth Daily 8)  Fish Oil 1000 Mg Caps (Omega-3 Fatty Acids) .... Take 2 Capsules Daily 9)  Coenzyme Q10 100 Mg Caps (Coenzyme Q10) .... Take Once A Day  Allergies (verified): No Known Drug  Allergies  Past History:  Past Medical History: Current Problems:  CAD (ICD-414.00)-/code STEMI with an acute inferior posterior  ST elevation myocardial infarction   a.  subsequent CABG Echo 4/20011:  EF 45-50%; inf HK; severe LVH; mild AS/AI, mild LAE, PASP 31 mmHg Ischemic Cardiomyopathy Chronic Systolic CHF Chronic Kidney Disease Stage 3 HYPERTENSION (ICD-401.9) Hyperlipidemia HIATAL HERNIA (ICD-553.3) Large TOBACCO ABUSE (ICD-305.1) LEG PAIN (ICD-729.5) Abnormal chest CT showing ectasia of the ascending aorta without focal aneurysm. Abnormal thyroid ultrasound dominant right inferior thyroid cold nodule. Presumed COPD. Peripheral arterial disease with claudication - bilateral SFA disease with ABI's 0.4-0.5 bilaterally Cerebrovascular Disease  Past Surgical History: Reviewed history from 10/22/2009 and no changes required.  percutaneous coronary intervention  laprotomy for bowel obstruction  CABG, aortic thoracic anuerysm resection 2010  Vital Signs:  Patient profile:   77 year old female Height:      69 inches Weight:      137.50 pounds BMI:     20.38 Pulse rate:   63 / minute Pulse rhythm:   regular Resp:     18 per minute BP sitting:   147 / 76  (left arm) Cuff size:   large  Vitals Entered By: Vikki Ports (November 29, 2009 10:57 AM)  Physical Exam  General:  Well nourished, well developed, in no acute distress HEENT: normal Neck: no JVD Cardiac:  normal S1, S2; RRR; 2/6 systolic murmur at RUSB and apex Lungs:  clear to auscultation bilaterally, no wheezing, rhonchi or rales Abd: soft, nontender, no hepatomegaly Ext: no edema Skin: warm and dry Neuro:  CNs 2-12 intact, no focal abnormalities noted    EKG  Procedure date:  11/29/2009  Findings:      Normal sinus rhythm Heart rate 63 Left axis deviation T-wave inversions in leads one and aVL and V5 and V6 Poor R-wave progression No change since October 2011 tracing.  Impression &  Recommendations:  Problem # 1:  CHRONIC SYSTOLIC HEART FAILURE (ICD-428.22)  Suspect she actually has combined systolic and diastolic CHF. She is now back on diuretics. Check BMET today. Continue to track weights. Follow up with Dr. Riley Kill in Jan as scheduled.  Orders: EKG w/ Interpretation (93000)  Problem # 2:  CAD, ARTERY BYPASS GRAFT (ICD-414.04)  Continue ASA. No anginal symptoms.  Problem # 3:  HYPERTENSION (ICD-401.9)  BP elevated today. Readings at home sound good.  No meds today. Continue current meds.  Orders: EKG w/ Interpretation (93000)  Problem # 4:  HYPERCHOLESTEROLEMIA  IIA (ICD-272.0) LDL 60 in the hospital and LFTs ok. She has some myalgias but her CPK was normal in the hospital. Her symptoms are not intolerable.  Continue for now.  We could consider dosing MWF if she continues to have symptoms.  Her updated medication list for this problem includes:    Crestor 40 Mg Tabs (Rosuvastatin calcium) .Marland Kitchen... Take one daily  Problem # 5:  CHRONIC KIDNEY DISEASE STAGE III (MODERATE) (ICD-585.3) Follow up with Dr. Abel Presto. Check BMET today.  Other Orders: TLB-BMP (Basic Metabolic Panel-BMET) (80048-METABOL)  Patient Instructions: 1)  Your physician recommends that you schedule a follow-up appointment in: Pt. has an appointmet with Dr. Riley Kill on Jan. 17th 2012. 2)  Your physician recommends that you return for lab work in: BMET today. 3)  Your physician recommends that you continue on your current medications as directed. Please refer to the Current Medication list given to you today.

## 2010-02-11 NOTE — Assessment & Plan Note (Signed)
Summary: 2 MO F/U   Visit Type:  Follow-up Primary Provider:  Ronaldo Miyamoto, MD, [facility] ([place])   History of Present Illness: Patient seems to be doing better.  Her SOB has improved.  Her cough is better.  Her SOB has improved.  She has not smoked since July.    Current Medications (verified): 1)  Metoprolol Tartrate 50 Mg Tabs (Metoprolol Tartrate) .... Take One Tablet By Mouth Twice A Day 2)  Lisinopril 10 Mg Tabs (Lisinopril) .... Take 1/2 Tablet By Mouth Once Daily 3)  Crestor 40 Mg Tabs (Rosuvastatin Calcium) .... Take One Daily 4)  Nitroglycerin 0.4 Mg Subl (Nitroglycerin) .... Take As Needed 5)  Aspirin 81 Mg Tbec (Aspirin) .... Take One Tablet By Mouth Daily 6)  Furosemide 40 Mg Tabs (Furosemide) .... Take One-Half Tablet By Mouth Daily. 7)  Diltiazem Hcl Er Beads 180 Mg Xr24h-Cap (Diltiazem Hcl Er Beads) .... Take One Capsule By Mouth Daily 8)  Miralax  Powd (Polyethylene Glycol 3350) .... As Needed  Allergies (verified): No Known Drug Allergies  Past History:  Past Medical History: Last updated: 08/11/2008 Current Problems:  CAD (ICD-414.00)-/code STEMI with an acute inferior posterior  ST elevation myocardial infarction HYPERTENSION (ICD-401.9) HIATAL HERNIA (ICD-553.3) Large TOBACCO ABUSE (ICD-305.1) LEG PAIN (ICD-729.5)  Abnormal chest CT showing ectasia of the ascending aorta without focal aneurysm.  Abnormal thyroid ultrasound dominant right inferior thyroid cold nodule.  Presumed COPD. Presumed peripheral arterial disease with claudication.  Vital Signs:  Patient profile:   77 year old female Height:      69 inches Weight:      119 pounds Pulse rate:   60 / minute BP sitting:   140 / 90  (left arm)  Vitals Entered By: Laurance Flatten CMA (January 28, 2009 2:46 PM)  Physical Exam  General:  Well developed, well nourished, in no acute distress. Head:  normocephalic and atraumatic Eyes:  PERRLA/EOM intact; conjunctiva and lids normal. Nose:  no deformity,  discharge, inflammation, or lesions Mouth:  Teeth, gums and palate normal. Oral mucosa normal. Neck:  small nodule R thyroid gland. Lungs:  Decrease breath sounds bilaterally without rales. Heart:  Distant heart sounds. Normal S1 and S2. Pos S4. Abdomen:  Soft with hepatosplenomegaly  No masses. Pulses:  Femoral pulses intact.  No bruits.   EKG  Procedure date:  01/28/2009  Findings:      NSR with LVH and repole changes.  Impression & Recommendations:  Problem # 1:  CAD (ICD-414.00) Stable since bypass.  No recurrent chest pain. Her updated medication list for this problem includes:    Metoprolol Tartrate 50 Mg Tabs (Metoprolol tartrate) .Marland Kitchen... Take one tablet by mouth twice a day    Lisinopril 10 Mg Tabs (Lisinopril) .Marland Kitchen... Take 1/2 tablet by mouth once daily    Nitroglycerin 0.4 Mg Subl (Nitroglycerin) .Marland Kitchen... Take as needed    Aspirin 81 Mg Tbec (Aspirin) .Marland Kitchen... Take one tablet by mouth daily    Diltiazem Hcl Er Beads 180 Mg Xr24h-cap (Diltiazem hcl er beads) .Marland Kitchen... Take one capsule by mouth daily  Orders: EKG w/ Interpretation (93000) TLB-CBC Platelet - w/Differential (85025-CBCD) TLB-BMP (Basic Metabolic Panel-BMET) (80048-METABOL) TLB-Hepatic/Liver Function Pnl (80076-HEPATIC) T-2 View CXR (71020TC)  Problem # 2:  PLEURAL EFFUSION (ICD-511.9) Needs repeat CXR.  Problem # 3:  THYROID NODULE (ICD-241.0) Scheduled to see Dr. Leslie Dales.  Path shows adenoma vs hyperplastic changes.  Scheduled for next month.  Problem # 4:  HYPERTENSION (ICD-401.9) still modestly elevated.  Continue medication. Her  updated medication list for this problem includes:    Metoprolol Tartrate 50 Mg Tabs (Metoprolol tartrate) .Marland Kitchen... Take one tablet by mouth twice a day    Lisinopril 10 Mg Tabs (Lisinopril) .Marland Kitchen... Take 1/2 tablet by mouth once daily    Aspirin 81 Mg Tbec (Aspirin) .Marland Kitchen... Take one tablet by mouth daily    Furosemide 40 Mg Tabs (Furosemide) .Marland Kitchen... Take one-half tablet by mouth daily.     Diltiazem Hcl Er Beads 180 Mg Xr24h-cap (Diltiazem hcl er beads) .Marland Kitchen... Take one capsule by mouth daily  Orders: EKG w/ Interpretation (93000) TLB-CBC Platelet - w/Differential (85025-CBCD) TLB-BMP (Basic Metabolic Panel-BMET) (80048-METABOL) TLB-Hepatic/Liver Function Pnl (80076-HEPATIC) T-2 View CXR (71020TC)  Problem # 5:  FECAL OCCULT BLOOD (ICD-792.1)  Needs stool cards, and CBC.  May need GI workup.  Orders: TLB-CBC Platelet - w/Differential (85025-CBCD) TLB-BMP (Basic Metabolic Panel-BMET) (80048-METABOL) TLB-Hepatic/Liver Function Pnl (80076-HEPATIC) T-2 View CXR (71020TC)  Problem # 6:  CLAUDICATION (ICD-443.9) Mainly in calfs.  Needs arterial dopplers of LE.  Other Orders: Arterial Duplex Lower Extremity (Arterial Duplex Low)  Patient Instructions: 1)  Your physician recommends that you schedule a follow-up appointment in: 1 MONTH 2)  Your physician recommends that you have lab work today: CBC, BMP, LIVER, Stool Cards 3)  A chest x-ray takes a picture of the organs and structures inside the chest, including the heart, lungs, and blood vessels. This test can show several things, including, whether the heart is enlarged; whether fluid is building up in the lungs; and whether pacemaker / defibrillator leads are still in place. 4)  Your physician has requested that you have a lower extremity arterial duplex.  This test is an ultrasound of the arteries in the legs. It looks at arterial blood flow in the legs.  Allow one hour for Lower Arterial scans. There are no restrictions or special instructions. 5)  Your physician recommends that you continue on your current medications as directed. Please refer to the Current Medication list given to you today.

## 2010-02-11 NOTE — Assessment & Plan Note (Signed)
Summary: PER CHECK OUT   Visit Type:  Follow-up Primary Provider:  Ronaldo Miyamoto, MD, [facility]  CC:  No complains.  History of Present Illness: She feels better than she has, but has insomnia.  Did not have in the hospital, but not as bad as now.  Denies any chest pain.  When she walks, she is doing pretty well.  She does get tired, but overall has been stable.    Current Medications (verified): 1)  Metoprolol Tartrate 50 Mg Tabs (Metoprolol Tartrate) .... Take One Tablet By Mouth Twice A Day 2)  Crestor 40 Mg Tabs (Rosuvastatin Calcium) .... Take One Daily 3)  Nitroglycerin 0.4 Mg Subl (Nitroglycerin) .... Take As Needed 4)  Aspirin 81 Mg Tbec (Aspirin) .... Take One Tablet By Mouth Daily 5)  Miralax  Powd (Polyethylene Glycol 3350) .... As Needed 6)  Furosemide 20 Mg Tabs (Furosemide) .... Take One Tablet By Mouth Daily. 7)  Amlodipine Besylate 5 Mg Tabs (Amlodipine Besylate) .... Take One Tablet By Mouth Daily  Allergies (verified): No Known Drug Allergies  Vital Signs:  Patient profile:   77 year old female Height:      69 inches Weight:      138 pounds BMI:     20.45 Pulse rate:   59 / minute Pulse rhythm:   regular Resp:     18 per minute BP sitting:   140 / 70  (left arm) Cuff size:   large  Vitals Entered By: Vikki Ports (July 26, 2009 2:02 PM)   Echocardiogram  Procedure date:  04/27/2009  Findings:       Study Conclusions    - Left ventricle: Distal septal and inferobasal hypokinesis Wall     thickness was increased in a pattern of severe LVH. Systolic     function was mildly reduced. The estimated ejection fraction was     in the range of 45% to 50%.   - Aortic valve: There was very mild stenosis. Mild regurgitation.     Valve area: 1.23cm^2(VTI). Valve area: 1.34cm^2 (Vmax).   - Mitral valve: Mild regurgitation.   - Left atrium: The atrium was mildly dilated.   - Atrial septum: No defect or patent foramen ovale was identified.   - Pulmonary  arteries: PA peak pressure: 31mm Hg (S).   TE Echocardiogram  Procedure date:  09/26/2008  Findings:       Left ventricle: The cavity size was normal. Wall thickness was   normal. Systolic function was normal.   Aortic valve: Structurally normal valve. The valve appears to be   grossly normal. Doppler: Mild to moderate regurgitation.   Aorta: The aorta was mildly calcified.   Mitral valve: Structurally normal valve. Doppler: Mild regurgitation   originating from the central commissure and directed centrally.   Left atrium: The atrium was normal in size. No evidence of thrombus   in the atrial cavity or appendage. No evidence of thrombus in the   appendage. The appendage was of normal size. Emptying velocity was   normal.  EKG  Procedure date:  07/26/2009  Findings:      LVH with repolarization abnormality.  Impression & Recommendations:  Problem # 1:  CAD, ARTERY BYPASS GRAFT (ICD-414.04) S/p CABG, and root replacement.   stable at present.. No recurrent symptoms Her updated medication list for this problem includes:    Metoprolol Tartrate 50 Mg Tabs (Metoprolol tartrate) .Marland Kitchen... Take one tablet by mouth twice a day    Nitroglycerin 0.4 Mg  Subl (Nitroglycerin) .Marland Kitchen... Take as needed    Aspirin 81 Mg Tbec (Aspirin) .Marland Kitchen... Take one tablet by mouth daily    Amlodipine Besylate 5 Mg Tabs (Amlodipine besylate) .Marland Kitchen... Take one tablet by mouth daily  Orders: T-Basic Metabolic Panel [phone])  Problem # 2:  AORTIC STENOSIS/ INSUFFICIENCY, NON-RHEUMATIC (ICD-424.1) See series of echoes.  Modest murmur.  AVA called 1.23.  Had TEE at time of surgery, and AVA leaflet sep was adequate. Her updated medication list for this problem includes:    Metoprolol Tartrate 50 Mg Tabs (Metoprolol tartrate) .Marland Kitchen... Take one tablet by mouth twice a day    Nitroglycerin 0.4 Mg Subl (Nitroglycerin) .Marland Kitchen... Take as needed    Furosemide 20 Mg Tabs (Furosemide) .Marland Kitchen... Take one tablet by mouth daily.  Her  updated medication list for this problem includes:    Metoprolol Tartrate 50 Mg Tabs (Metoprolol tartrate) .Marland Kitchen... Take one tablet by mouth twice a day    Nitroglycerin 0.4 Mg Subl (Nitroglycerin) .Marland Kitchen... Take as needed    Furosemide 20 Mg Tabs (Furosemide) .Marland Kitchen... Take one tablet by mouth daily.  Problem # 3:  HYPERCHOLESTEROLEMIA  IIA (ICD-272.0) last LDL 85mg /dl.  Continue current medications.  Her updated medication list for this problem includes:    Crestor 40 Mg Tabs (Rosuvastatin calcium) .Marland Kitchen... Take one daily  Problem # 4:  CAROTID ARTERY STENOSIS (ICD-433.10) 60-79% bilateral.  Recheck in 6 months.  Her updated medication list for this problem includes:    Aspirin 81 Mg Tbec (Aspirin) .Marland Kitchen... Take one tablet by mouth daily  Orders: T-Basic Metabolic Panel [phone])  Problem # 5:  HYPERTENSION (ICD-401.9) controlled.  Has some insomnia   ?metop  we talked about changing, but she is not ready to change meds yet after last episode in hospital.   Her updated medication list for this problem includes:    Metoprolol Tartrate 50 Mg Tabs (Metoprolol tartrate) .Marland Kitchen... Take one tablet by mouth twice a day    Aspirin 81 Mg Tbec (Aspirin) .Marland Kitchen... Take one tablet by mouth daily    Furosemide 20 Mg Tabs (Furosemide) .Marland Kitchen... Take one tablet by mouth daily.    Amlodipine Besylate 5 Mg Tabs (Amlodipine besylate) .Marland Kitchen... Take one tablet by mouth daily  Patient Instructions: 1)  Your physician recommends that you have lab work today: BMP 2)  Your physician recommends that you continue on your current medications as directed. Please refer to the Current Medication list given to you today. 3)  Your physician wants you to follow-up in:   3 MONTHS. You will receive a reminder letter in the mail two months in advance. If you don't receive a letter, please call our office to schedule the follow-up appointment.

## 2010-02-11 NOTE — Assessment & Plan Note (Signed)
Summary: 1 month rov/sl   Visit Type:  1 month follow up Primary Provider:  Ronaldo Miyamoto, MD, [facility]  CC:  No complains.  History of Present Illness: Overall doing well except for her legs.  They feel like weights on her feet after she has walked very far.  He saw Dr. Leslie Dales, and he recommended that she come back in a year.  Was told thyroid was ok at this point.  No chest pain.  Current Medications (verified): 1)  Metoprolol Tartrate 50 Mg Tabs (Metoprolol Tartrate) .... Take One Tablet By Mouth Twice A Day 2)  Lisinopril 10 Mg Tabs (Lisinopril) .... Take 1/2 Tablet By Mouth Once Daily 3)  Crestor 40 Mg Tabs (Rosuvastatin Calcium) .... Take One Daily 4)  Nitroglycerin 0.4 Mg Subl (Nitroglycerin) .... Take As Needed 5)  Aspirin 81 Mg Tbec (Aspirin) .... Take One Tablet By Mouth Daily 6)  Furosemide 40 Mg Tabs (Furosemide) .... (On Hold) Take One-Half Tablet By Mouth Daily. 7)  Diltiazem Hcl Er Beads 180 Mg Xr24h-Cap (Diltiazem Hcl Er Beads) .... Take One Capsule By Mouth Daily 8)  Miralax  Powd (Polyethylene Glycol 3350) .... As Needed  Allergies (verified): No Known Drug Allergies  Vital Signs:  Patient profile:   77 year old female Height:      69 inches Weight:      125.50 pounds BMI:     18.60 Pulse rate:   62 / minute Pulse rhythm:   regular Resp:     18 per minute BP sitting:   160 / 74  (left arm) Cuff size:   regular  Vitals Entered By: Vikki Ports (February 19, 2009 10:26 AM)  Physical Exam  General:  Well developed, well nourished, in no acute distress. Lungs:  Minimal ronchii.  No definite rales. Heart:  Normal S1 and S2.  Minimal SEM.  No DM. Extremities:  No clubbing or cyanosis.  Decrease pulses.   Impression & Recommendations:  Problem # 1:  CAD (ICD-414.00) Post CABG and doing well.  On combo therapy at present.  Her updated medication list for this problem includes:    Metoprolol Tartrate 50 Mg Tabs (Metoprolol tartrate) .Marland Kitchen... Take one  tablet by mouth twice a day    Lisinopril 10 Mg Tabs (Lisinopril) .Marland Kitchen... Take 1/2 tablet by mouth once daily    Nitroglycerin 0.4 Mg Subl (Nitroglycerin) .Marland Kitchen... Take as needed    Aspirin 81 Mg Tbec (Aspirin) .Marland Kitchen... Take one tablet by mouth daily    Diltiazem Hcl Er Beads 180 Mg Xr24h-cap (Diltiazem hcl er beads) .Marland Kitchen... Take one capsule by mouth daily  Problem # 2:  PLEURAL EFFUSION (ICD-511.9) Not short of breath and no definite symptoms at this point.   Off diuretic.  No more shortness of breath.  Still goes to [facility].    Problem # 3:  CLAUDICATION (ICD-443.9) Scheduled to see Dr. Excell Seltzer.    Mainly in calfs.  Needs arterial dopplers of LE.  Problem # 4:  THYROID NODULE (ICD-241.0) saw Dr. Leslie Dales recently.   Problem # 5:  HYPERCHOLESTEROLEMIA  IIA (ICD-272.0)  Tolerating without difficulty. Her updated medication list for this problem includes:    Crestor 40 Mg Tabs (Rosuvastatin calcium) .Marland Kitchen... Take one daily  Orders: TLB-Hepatic/Liver Function Pnl (80076-HEPATIC) TLB-Lipid Panel (80061-LIPID)  Other Orders: T-2 View CXR (71020TC) TLB-BMP (Basic Metabolic Panel-BMET) (80048-METABOL) TLB-CBC Platelet - w/Differential (85025-CBCD)  Patient Instructions: 1)  Your physician recommends that you schedule a follow-up appointment in: 2 months with Dr.  [name] 2)  Your physician recommends that you return for a FASTING lipid profile: today 3)  Your physician recommends that you get lab work today. 4)  Your physician recommends that you continue on your current medications as directed. Please refer to the Current Medication list given to you today. 5)  A chest x-ray takes a picture of the organs and structures inside the chest, including the heart, lungs, and blood vessels. This test can show several things, including, whether the heart is enlarged; whether fluid is building up in the lungs; and whether pacemaker / defibrillator leads are still in place.

## 2010-02-11 NOTE — Letter (Signed)
Summary: [facility] Endocrinology Office Note  [facility] Endocrinology Office Note   Imported By: Roderic Ovens 04/15/2009 15:37:19  _____________________________________________________________________  External Attachment:    Type:   Image     Comment:   External Document

## 2010-02-11 NOTE — Assessment & Plan Note (Signed)
Summary: 2 month rov/sl   Visit Type:  2 months follow up Primary Provider:  Ronaldo Miyamoto, MD, [facility]  CC:  Arms soreness- hair loss.  History of Present Illness: Her arms are sore, thought it might be from the Crestor.  Hair loss has picked up since amlodipine.  No chest pain.  Has not resumed smoking.    Current Medications (verified): 1)  Metoprolol Tartrate 50 Mg Tabs (Metoprolol Tartrate) .... Take One Tablet By Mouth Twice A Day 2)  Crestor 40 Mg Tabs (Rosuvastatin Calcium) .... Take One Daily 3)  Nitroglycerin 0.4 Mg Subl (Nitroglycerin) .... Take As Needed 4)  Aspirin 81 Mg Tbec (Aspirin) .... Take One Tablet By Mouth Daily 5)  Amlodipine Besylate 5 Mg Tabs (Amlodipine Besylate) .... Take One Tablet By Mouth Daily 6)  Miralax  Powd (Polyethylene Glycol 3350) .... As Needed  Allergies (verified): No Known Drug Allergies  Vital Signs:  Patient profile:   77 year old female Height:      69 inches Weight:      130.75 pounds BMI:     19.38 Pulse rate:   72 / minute Pulse rhythm:   regular Resp:     18 per minute BP sitting:   134 / 73  (left arm) Cuff size:   regular  Vitals Entered By: Vikki Ports (April 22, 2009 2:07 PM)  Physical Exam  General:  Well developed, well nourished, in no acute distress. Head:  normocephalic and atraumatic Eyes:  PERRLA/EOM intact; conjunctiva and lids normal. Lungs:  Decrease breath sounds.  No rales or ronchi. Heart:  PMI non displaced.  Normal S1 and S2, without murmur or rub.   Abdomen:  Bowel sounds positive; abdomen soft and non-tender without masses, organomegaly, or hernias noted. No hepatosplenomegaly. Extremities:  No clubbing or cyanosis.  No edema. Neurologic:  Alert and oriented x 3.   EKG  Procedure date:  04/22/2009  Findings:      NSR.  LVH.  repolarization abnormality.   Impression & Recommendations:  Problem # 1:  CAD, ARTERY BYPASS GRAFT (ICD-414.04)  stable post bypass.  No symptoms.  No longer  smoking.  Her updated medication list for this problem includes:    Metoprolol Tartrate 50 Mg Tabs (Metoprolol tartrate) .Marland Kitchen... Take one tablet by mouth twice a day    Nitroglycerin 0.4 Mg Subl (Nitroglycerin) .Marland Kitchen... Take as needed    Aspirin 81 Mg Tbec (Aspirin) .Marland Kitchen... Take one tablet by mouth daily    Diltiazem Hcl Er Beads 180 Mg Xr24h-cap (Diltiazem hcl er beads) .Marland Kitchen... Take 1 capsule daily  Her updated medication list for this problem includes:    Metoprolol Tartrate 50 Mg Tabs (Metoprolol tartrate) .Marland Kitchen... Take one tablet by mouth twice a day    Nitroglycerin 0.4 Mg Subl (Nitroglycerin) .Marland Kitchen... Take as needed    Aspirin 81 Mg Tbec (Aspirin) .Marland Kitchen... Take one tablet by mouth daily    Amlodipine Besylate 5 Mg Tabs (Amlodipine besylate) .Marland Kitchen... Take one tablet by mouth daily  Problem # 2:  HYPERCHOLESTEROLEMIA  IIA (ICD-272.0) Has arm discomfort on medication.  Will recheck levels, and get CPK.  Depending on results, consider reducing dose of medication. Her updated medication list for this problem includes:    Crestor 40 Mg Tabs (Rosuvastatin calcium) .Marland Kitchen... Take one daily  Orders: EKG w/ Interpretation (93000) T-2 View CXR (71020TC) TLB-BMP (Basic Metabolic Panel-BMET) (80048-METABOL) TLB-Hepatic/Liver Function Pnl (80076-HEPATIC) TLB-Lipid Panel (80061-LIPID) TLB-TSH (Thyroid Stimulating Hormone) (84443-TSH) TLB-CK Total Only(Creatine Kinase/CPK) (82550-CK)  Problem # 3:  HYPERTENSION (ICD-401.9) Thinks hair loss is related to amlodipine.  She does not think metop, although more likely.  She wants to go back on dilt.  Suggest Bp cuff at home, home monitor, and then see how she dows.   Her updated medication list for this problem includes:    Metoprolol Tartrate 50 Mg Tabs (Metoprolol tartrate) .Marland Kitchen... Take one tablet by mouth twice a day    Aspirin 81 Mg Tbec (Aspirin) .Marland Kitchen... Take one tablet by mouth daily    Diltiazem Hcl Er Beads 180 Mg Xr24h-cap (Diltiazem hcl er beads) .Marland Kitchen... Take 1 capsule  daily  Orders: EKG w/ Interpretation (93000) T-2 View CXR (71020TC) TLB-BMP (Basic Metabolic Panel-BMET) (80048-METABOL) TLB-Hepatic/Liver Function Pnl (80076-HEPATIC) TLB-Lipid Panel (80061-LIPID) TLB-TSH (Thyroid Stimulating Hormone) (84443-TSH) TLB-CK Total Only(Creatine Kinase/CPK) (82550-CK)  Patient Instructions: 1)  Your physician recommends that you schedule a follow-up appointment in:  2months 2)  Your physician recommends that you have llab work today: BMP, TSH, Lipid, Liver, CPK.  Pt will return for follow-up chest xray. 3)  Your physician has recommended you make the following change in your medication: Stop Amlodipine.  Take Diltiazem 180mg  daily 4)  Your physician has requested that you regularly monitor and record your blood pressure readings at home.  Please use the same machine at the same time of day to check your readings and record them to bring to your follow-up visit.  Call Lauren in 2-3 weeks with your blood pressure readings. Prescriptions: DILTIAZEM HCL ER BEADS 180 MG XR24H-CAP (DILTIAZEM HCL ER BEADS) Take 1 capsule daily  #30 x 6   Entered by:   Lisabeth Devoid RN   Authorized by:   Ronaldo Miyamoto, MD, [facility]   Signed by:   Lisabeth Devoid RN on 04/22/2009   Method used:   Electronically to        [facility] (retail)       [address]       [place], [place]  [address]       Ph: [phone]       Fax: [phone]   RxID:   [id number]

## 2010-02-11 NOTE — Progress Notes (Signed)
Summary: med question  Phone Note Call from Patient Call back at [facility] Phone [phone]   Caller: Patient Reason for Call: Talk to Nurse Summary of Call: req call back from nurse... questions about meds Initial call taken by: Migdalia Dk,  February 22, 2009 11:16 AM  Follow-up for Phone Call        I spoke with the pt and made her aware that Dr Riley Kill would like her to STOP Lisinopril and Diltiazem.  The pt will start Amlodipine 5mg  daily and have a BMP rechecked on 02/26/09.  The pt agrees with plan. Follow-up by: Julieta Gutting, RN, BSN,  February 22, 2009 11:30 AM    New/Updated Medications: METOPROLOL TARTRATE 50 MG TABS (METOPROLOL TARTRATE) Take one tablet by mouth twice a day AMLODIPINE BESYLATE 5 MG TABS (AMLODIPINE BESYLATE) Take one tablet by mouth daily Prescriptions: CRESTOR 40 MG TABS (ROSUVASTATIN CALCIUM) take one daily  #30 x 8   Entered by:   Julieta Gutting, RN, BSN   Authorized by:   Ronaldo Miyamoto, MD, [facility]   Signed by:   Julieta Gutting, RN, BSN on 02/22/2009   Method used:   Electronically to        [facility] (retail)       [address]       [place], [place]  [address]       Ph: [phone]       Fax: [phone]   RxID:   [id number] METOPROLOL TARTRATE 50 MG TABS (METOPROLOL TARTRATE) Take one tablet by mouth twice a day  #60 x 8   Entered by:   Julieta Gutting, RN, BSN   Authorized by:   Ronaldo Miyamoto, MD, [facility]   Signed by:   Julieta Gutting, RN, BSN on 02/22/2009   Method used:   Electronically to        [facility] (retail)       [address]       [place], [place]  [address]       Ph: [phone]       Fax: [phone]   RxID:   [id number] AMLODIPINE BESYLATE 5 MG TABS (AMLODIPINE BESYLATE) Take one tablet by mouth daily  #30 x 8   Entered by:   Julieta Gutting, RN, BSN   Authorized by:   Ronaldo Miyamoto, MD, [facility]   Signed by:   Julieta Gutting, RN, BSN on  02/22/2009   Method used:   Electronically to        [facility] (retail)       [address]       [place], [place]  [address]       Ph: [phone]       Fax: [phone]   RxID:   [id number]

## 2010-02-11 NOTE — Assessment & Plan Note (Signed)
Summary: eph   Visit Type:  Follow-up Primary Provider:  Ronaldo Miyamoto, MD, [facility]  CC:  no complaints.  History of Present Illness: She has not lost any hair, and her breathing is ok.   Denies chest pain.  Patient's dilt was stopped, and amlodpine restarted.  No shortness of breath since discharge from the hospitlal.   Now feels pretty good.     Current Medications (verified): 1)  Metoprolol Tartrate 50 Mg Tabs (Metoprolol Tartrate) .... Take One Tablet By Mouth Twice A Day 2)  Crestor 40 Mg Tabs (Rosuvastatin Calcium) .... Take One Daily 3)  Nitroglycerin 0.4 Mg Subl (Nitroglycerin) .... Take As Needed 4)  Aspirin 81 Mg Tbec (Aspirin) .... Take One Tablet By Mouth Daily 5)  Miralax  Powd (Polyethylene Glycol 3350) .... As Needed 6)  Furosemide 20 Mg Tabs (Furosemide) .... Take One Tablet By Mouth Daily. 7)  Amlodipine Besylate 5 Mg Tabs (Amlodipine Besylate) .... Take One Tablet By Mouth Daily  Allergies (verified): No Known Drug Allergies  Past History:  Past Medical History: Last updated: 02/26/2009 Current Problems:  CAD (ICD-414.00)-/code STEMI with an acute inferior posterior  ST elevation myocardial infarction HYPERTENSION (ICD-401.9) HIATAL HERNIA (ICD-553.3) Large TOBACCO ABUSE (ICD-305.1) LEG PAIN (ICD-729.5)  Abnormal chest CT showing ectasia of the ascending aorta without focal aneurysm.  Abnormal thyroid ultrasound dominant right inferior thyroid cold nodule.  Presumed COPD. Peripheral arterial disease with claudication - bilateral SFA disease with ABI's 0.4-0.5 bilaterally  Vital Signs:  Patient profile:   77 year old female Height:      69 inches Weight:      130 pounds BMI:     19.27 Pulse rate:   60 / minute BP sitting:   139 / 79  (left arm) Cuff size:   regular  Vitals Entered By: Burnett Kanaris, CNA (May 08, 2009 12:30 PM)  Physical Exam  General:  elderly white female, no acute distress.   Head:  normocephalic and atraumatic Eyes:   PERRLA/EOM intact; conjunctiva and lids normal. Lungs:  Clear bilaterally to auscultation and percussion.  Some decrease in breath sounds and prolonged expiration.  No rales in bases.   Heart:  Normal S1 and S2.  SEM.  Minimal aortic diastolic murmur.   Abdomen:  Bowel sounds positive; abdomen soft and non-tender without masses, organomegaly, or hernias noted. No hepatosplenomegaly. Extremities:  No clubbing or cyanosis. Neurologic:  Alert and oriented x 3.   EKG  Procedure date:  05/08/2009  Findings:      NSR.  LVH with repolarization abnormality.    Echocardiogram  Procedure date:  04/27/2009  Findings:       Study Conclusions    - Left ventricle: Distal septal and inferobasal hypokinesis Wall     thickness was increased in a pattern of severe LVH. Systolic     function was mildly reduced. The estimated ejection fraction was     in the range of 45% to 50%.   - Aortic valve: There was very mild stenosis. Mild regurgitation.     Valve area: 1.23cm^2(VTI). Valve area: 1.34cm^2 (Vmax).   - Mitral valve: Mild regurgitation.   - Left atrium: The atrium was mildly dilated.   - Atrial septum: No defect or patent foramen ovale was identified.   - Pulmonary arteries: PA peak pressure: 31mm Hg (S).  Impression & Recommendations:  Problem # 1:  CAD, ARTERY BYPASS GRAFT (ICD-414.04) Patient had surgery, with aortic root replacement.  Now has mild stenosis and AI  post op.  Develop CHF symptoms after dilt replaced by amlodipine, but also had small effusion before.  Will continue to monitor closely.   The following medications were removed from the medication list:    Diltiazem Hcl Er Beads 180 Mg Xr24h-cap (Diltiazem hcl er beads) .Marland Kitchen... Take 1 capsule daily Her updated medication list for this problem includes:    Metoprolol Tartrate 50 Mg Tabs (Metoprolol tartrate) .Marland Kitchen... Take one tablet by mouth twice a day    Nitroglycerin 0.4 Mg Subl (Nitroglycerin) .Marland Kitchen... Take as needed    Aspirin 81  Mg Tbec (Aspirin) .Marland Kitchen... Take one tablet by mouth daily    Amlodipine Besylate 5 Mg Tabs (Amlodipine besylate) .Marland Kitchen... Take one tablet by mouth daily  Orders: EKG w/ Interpretation (93000)  Problem # 2:  PLEURAL EFFUSION (ICD-511.9) Diuresed, and meds changed back.  Will recheck CXR when she follows up.  Exam benign at present.    Problem # 3:  HYPERTENSION (ICD-401.9) ECG reflects changes.  Continue medical treatment.  The following medications were removed from the medication list:    Diltiazem Hcl Er Beads 180 Mg Xr24h-cap (Diltiazem hcl er beads) .Marland Kitchen... Take 1 capsule daily Her updated medication list for this problem includes:    Metoprolol Tartrate 50 Mg Tabs (Metoprolol tartrate) .Marland Kitchen... Take one tablet by mouth twice a day    Aspirin 81 Mg Tbec (Aspirin) .Marland Kitchen... Take one tablet by mouth daily    Furosemide 20 Mg Tabs (Furosemide) .Marland Kitchen... Take one tablet by mouth daily.    Amlodipine Besylate 5 Mg Tabs (Amlodipine besylate) .Marland Kitchen... Take one tablet by mouth daily  Orders: EKG w/ Interpretation (93000) TLB-BMP (Basic Metabolic Panel-BMET) (80048-METABOL) TLB-Magnesium (Mg) (83735-MG)  Problem # 4:  HYPERCHOLESTEROLEMIA  IIA (ICD-272.0) on agressive LL therapy.   Her updated medication list for this problem includes:    Crestor 40 Mg Tabs (Rosuvastatin calcium) .Marland Kitchen... Take one daily  Problem # 5:  TOBACCO ABUSE (ICD-305.1) no longer using.  Problem # 6:  LEG PAIN (ICD-729.5) PVD with reduced ABIs.  Will readress at next OV.    Patient Instructions: 1)  Your physician recommends that you schedule a follow-up appointment in: 4 weeks with Dr. Riley Kill 2)  Your physician recommends that you haver lab work today: BMET, Magnesium 3)  Your physician recommends that you continue on your current medications as directed. Please refer to the Current Medication list given to you today.

## 2010-02-13 NOTE — Letter (Signed)
Summary: [facility]   Imported By: Marylou Mccoy 02/06/2010 16:22:25  _____________________________________________________________________  External Attachment:    Type:   Image     Comment:   External Document

## 2010-02-13 NOTE — Assessment & Plan Note (Signed)
Summary: f84m   Visit Type:  Follow-up Primary Provider:  Ronaldo Miyamoto, MD, [facility]  CC:  no complaints.  History of Present Illness: Is doing well from a cardiac standpoint.  Denies chest pain.  Has been followed closely by [facility]  (Dr. Salena Saner).  Stable at this point.    Problems Prior to Update: 1)  Chronic Kidney Disease Stage Iii (MODERATE)  (ICD-585.3) 2)  Chronic Systolic Heart Failure  (ICD-428.22) 3)  Renal Insufficiency  (ICD-588.9) 4)  Aortic Stenosis/ Insufficiency, Non-rheumatic  (ICD-424.1) 5)  Carotid Artery Stenosis  (ICD-433.10) 6)  Bruit  (ICD-785.9) 7)  Cad, Artery Bypass Graft  (ICD-414.04) 8)  Cad, Autologous Bypass Graft  (ICD-414.02) 9)  Acquired Hemolytic Anemia Unspecified  (ICD-283.9) 10)  Hypercholesterolemia Iia  (ICD-272.0) 11)  Encounter For Long-term Use of Other Medications  (ICD-V58.69) 12)  Atherosclerosis W /int Claudication  (ICD-440.21) 13)  Fecal Occult Blood  (ICD-792.1) 14)  Pleural Effusion  (ICD-511.9) 15)  Thyroid Nodule  (ICD-241.0) 16)  Mitral Regurg w/ Aortic Insuff, Rheum/non-rheum  (ICD-396.3) 17)  Cad  (ICD-414.00) 18)  Hypertension  (ICD-401.9) 19)  Hiatal Hernia  (ICD-553.3) 20)  Tobacco Abuse  (ICD-305.1) 21)  Leg Pain  (ICD-729.5)  Current Medications (verified): 1)  Metoprolol Tartrate 25 Mg Tabs (Metoprolol Tartrate) .... Take One Tablet By Mouth Twice A Day 2)  Crestor 40 Mg Tabs (Rosuvastatin Calcium) .... Take One Daily 3)  Nitroglycerin 0.4 Mg Subl (Nitroglycerin) .... Take As Needed 4)  Aspirin 81 Mg Tbec (Aspirin) .... Take One Tablet By Mouth Daily 5)  Miralax  Powd (Polyethylene Glycol 3350) .... As Needed 6)  Lasix 20 Mg Tabs (Furosemide) .... Take 1 Tablet By Mouth Once A Day 7)  Amlodipine Besylate 10 Mg Tabs (Amlodipine Besylate) .... Take One Tablet By Mouth Daily 8)  Fish Oil 1000 Mg Caps (Omega-3 Fatty Acids) .... Take 2 Capsules Daily 9)  Coenzyme Q10 100 Mg Caps (Coenzyme Q10) ....  Take Once A Day 10)  Iron Injection .... Injection Every 2 Weeks  Allergies (verified): No Known Drug Allergies  Vital Signs:  Patient profile:   77 year old female Height:      69 inches Weight:      138 pounds BMI:     20.45 Pulse rate:   72 / minute Resp:     18 per minute BP sitting:   124 / 68  (left arm) Cuff size:   regular  Vitals Entered By: Vikki Ports (January 28, 2010 2:26 PM)  Physical Exam  General:  Well developed, well nourished, in no acute distress. Head:  normocephalic and atraumatic Neck:  Prominent r carotid bruit.  Lungs:  Clear bilaterally to auscultation and percussion.  SLightly prolonged expiratory phase.   Heart:  Non-displaced PMI, chest non-tender; regular rate and rhythm, S1, S2.  SEM  2/6.  No DM.  Extremities:  no edema Neurologic:  Alert and oriented x 3.   Echocardiogram  Procedure date:  04/27/2009  Findings:      - Left ventricle: Distal septal and inferobasal hypokinesis Wall     thickness was increased in a pattern of severe LVH. Systolic     function was mildly reduced. The estimated ejection fraction was     in the range of 45% to 50%.   - Aortic valve: There was very mild stenosis. Mild regurgitation.     Valve area: 1.23cm^2(VTI). Valve area: 1.34cm^2 (Vmax).   - Mitral valve: Mild regurgitation.   -  Left atrium: The atrium was mildly dilated.   - Atrial septum: No defect or patent foramen ovale was identified.   - Pulmonary arteries: PA peak pressure: 31mm Hg (S).   Impression & Recommendations:  Problem # 1:  CAD, AUTOLOGOUS BYPASS GRAFT (ICD-414.02) appears stable from this point.  Doing well overall. Her updated medication list for this problem includes:    Metoprolol Tartrate 25 Mg Tabs (Metoprolol tartrate) .Marland Kitchen... Take one tablet by mouth twice a day    Nitroglycerin 0.4 Mg Subl (Nitroglycerin) .Marland Kitchen... Take as needed    Aspirin 81 Mg Tbec (Aspirin) .Marland Kitchen... Take one tablet by mouth daily    Amlodipine Besylate 10 Mg  Tabs (Amlodipine besylate) .Marland Kitchen... Take one tablet by mouth daily  Problem # 2:  AORTIC STENOSIS/ INSUFFICIENCY, NON-RHEUMATIC (ICD-424.1) mild post op--see April echo. Her updated medication list for this problem includes:    Metoprolol Tartrate 25 Mg Tabs (Metoprolol tartrate) .Marland Kitchen... Take one tablet by mouth twice a day    Nitroglycerin 0.4 Mg Subl (Nitroglycerin) .Marland Kitchen... Take as needed    Lasix 20 Mg Tabs (Furosemide) .Marland Kitchen... Take 1 tablet by mouth once a day  Problem # 3:  RENAL INSUFFICIENCY (ICD-588.9) followed by Dr. Salena Saner.  Of note, has been getting procrit and iron.  Labs being done by them.  Problem # 4:  CAROTID ARTERY STENOSIS (ICD-433.10) being followed by [facility].  Also for av graft for dialysis eventually.  See note of Dr. Durwin Nora.  She will need followup carotid now.  Her updated medication list for this problem includes:    Aspirin 81 Mg Tbec (Aspirin) .Marland Kitchen... Take one tablet by mouth daily  Patient Instructions: 1)  Your physician recommends that you continue on your current medications as directed. Please refer to the Current Medication list given to you today. 2)  Your physician wants you to follow-up in: 3 MONTHS.  You will receive a reminder letter in the mail two months in advance. If you don't receive a letter, please call our office to schedule the follow-up appointment.

## 2010-02-19 ENCOUNTER — Other Ambulatory Visit: Payer: Self-pay | Admitting: Nephrology

## 2010-02-19 ENCOUNTER — Other Ambulatory Visit: Payer: Self-pay

## 2010-02-19 ENCOUNTER — Encounter (HOSPITAL_COMMUNITY): Payer: MEDICARE | Attending: Nephrology

## 2010-02-19 DIAGNOSIS — D599 Acquired hemolytic anemia, unspecified: Secondary | ICD-10-CM | POA: Insufficient documentation

## 2010-02-19 DIAGNOSIS — N183 Chronic kidney disease, stage 3 unspecified: Secondary | ICD-10-CM | POA: Insufficient documentation

## 2010-02-19 LAB — IRON AND TIBC
Saturation Ratios: 26 % (ref 20–55)
UIBC: 246 ug/dL

## 2010-02-19 LAB — FERRITIN: Ferritin: 667 ng/mL — ABNORMAL HIGH (ref 10–291)

## 2010-03-05 ENCOUNTER — Encounter (HOSPITAL_COMMUNITY): Payer: MEDICARE

## 2010-03-24 LAB — IRON AND TIBC
Saturation Ratios: 18 % — ABNORMAL LOW (ref 20–55)
TIBC: 309 ug/dL (ref 250–470)

## 2010-03-24 LAB — FERRITIN: Ferritin: 104 ng/mL (ref 10–291)

## 2010-03-24 LAB — POCT HEMOGLOBIN-HEMACUE: Hemoglobin: 11.4 g/dL — ABNORMAL LOW (ref 12.0–15.0)

## 2010-03-25 LAB — COMPREHENSIVE METABOLIC PANEL
ALT: 11 U/L (ref 0–35)
AST: 23 U/L (ref 0–37)
Albumin: 3.8 g/dL (ref 3.5–5.2)
Chloride: 104 mEq/L (ref 96–112)
Creatinine, Ser: 2.15 mg/dL — ABNORMAL HIGH (ref 0.4–1.2)
GFR calc Af Amer: 27 mL/min — ABNORMAL LOW (ref >60)
Sodium: 137 mEq/L (ref 135–145)
Total Bilirubin: 0.9 mg/dL (ref 0.3–1.2)

## 2010-03-25 LAB — BASIC METABOLIC PANEL
CO2: 28 mEq/L (ref 19–32)
Calcium: 9.3 mg/dL (ref 8.4–10.5)
Calcium: 9.4 mg/dL (ref 8.4–10.5)
Calcium: 9.7 mg/dL (ref 8.4–10.5)
Chloride: 93 mEq/L — ABNORMAL LOW (ref 96–112)
Creatinine, Ser: 2.42 mg/dL — ABNORMAL HIGH (ref 0.4–1.2)
GFR calc Af Amer: 20 mL/min — ABNORMAL LOW (ref >60)
GFR calc Af Amer: 20 mL/min — ABNORMAL LOW (ref >60)
GFR calc Af Amer: 23 mL/min — ABNORMAL LOW (ref >60)
GFR calc non Af Amer: 17 mL/min — ABNORMAL LOW (ref >60)
Sodium: 132 mEq/L — ABNORMAL LOW (ref 135–145)
Sodium: 137 mEq/L (ref 135–145)

## 2010-03-25 LAB — LIPID PANEL
Cholesterol: 153 mg/dL (ref 0–200)
HDL: 66 mg/dL (ref >39)

## 2010-03-25 LAB — CBC
HCT: 31.5 % — ABNORMAL LOW (ref 36.0–46.0)
Hemoglobin: 9.8 g/dL — ABNORMAL LOW (ref 12.0–15.0)
MCH: 27.5 pg (ref 26.0–34.0)
MCHC: 32.6 g/dL (ref 30.0–36.0)
MCV: 87.5 fL (ref 78.0–100.0)
RBC: 3.53 MIL/uL — ABNORMAL LOW (ref 3.87–5.11)
RBC: 3.6 MIL/uL — ABNORMAL LOW (ref 3.87–5.11)
WBC: 10 10*3/uL (ref 4.0–10.5)

## 2010-03-25 LAB — POCT I-STAT 3, ART BLOOD GAS (G3+)
Acid-base deficit: 4 mmol/L — ABNORMAL HIGH (ref 0.0–2.0)
Bicarbonate: 22.9 mEq/L (ref 20.0–24.0)
O2 Saturation: 100 %
O2 Saturation: 94 %
TCO2: 24 mmol/L (ref 0–100)
TCO2: 24 mmol/L (ref 0–100)
pCO2 arterial: 41.4 mmHg (ref 35.0–45.0)
pCO2 arterial: 49.7 mmHg — ABNORMAL HIGH (ref 35.0–45.0)
pO2, Arterial: 230 mmHg — ABNORMAL HIGH (ref 80.0–100.0)
pO2, Arterial: 74 mmHg — ABNORMAL LOW (ref 80.0–100.0)

## 2010-03-25 LAB — URINALYSIS, ROUTINE W REFLEX MICROSCOPIC
Hgb urine dipstick: NEGATIVE
Nitrite: NEGATIVE
Protein, ur: NEGATIVE mg/dL
Specific Gravity, Urine: 1.008 (ref 1.005–1.030)
Urobilinogen, UA: 0.2 mg/dL (ref 0.0–1.0)

## 2010-03-25 LAB — POCT I-STAT, CHEM 8
BUN: 30 mg/dL — ABNORMAL HIGH (ref 6–23)
Chloride: 106 mEq/L (ref 96–112)
Creatinine, Ser: 2 mg/dL — ABNORMAL HIGH (ref 0.4–1.2)
Glucose, Bld: 193 mg/dL — ABNORMAL HIGH (ref 70–99)
Hemoglobin: 11.2 g/dL — ABNORMAL LOW (ref 12.0–15.0)
Potassium: 4.5 mEq/L (ref 3.5–5.1)
Sodium: 137 mEq/L (ref 135–145)

## 2010-03-25 LAB — DIFFERENTIAL
Eosinophils Absolute: 0.1 10*3/uL (ref 0.0–0.7)
Eosinophils Relative: 1 % (ref 0–5)
Lymphocytes Relative: 14 % (ref 12–46)
Lymphs Abs: 1.4 10*3/uL (ref 0.7–4.0)
Monocytes Absolute: 0.5 10*3/uL (ref 0.1–1.0)

## 2010-03-25 LAB — CARDIAC PANEL(CRET KIN+CKTOT+MB+TROPI)
CK, MB: 2.6 ng/mL (ref 0.3–4.0)
Relative Index: INVALID (ref 0.0–2.5)
Relative Index: INVALID (ref 0.0–2.5)
Total CK: 86 U/L (ref 7–177)
Troponin I: 0.04 ng/mL (ref 0.00–0.06)

## 2010-03-25 LAB — URINE MICROSCOPIC-ADD ON

## 2010-03-25 LAB — BRAIN NATRIURETIC PEPTIDE: Pro B Natriuretic peptide (BNP): 614 pg/mL — ABNORMAL HIGH (ref 0.0–100.0)

## 2010-03-25 LAB — POCT HEMOGLOBIN-HEMACUE: Hemoglobin: 10.5 g/dL — ABNORMAL LOW (ref 12.0–15.0)

## 2010-03-25 LAB — CK TOTAL AND CKMB (NOT AT ARMC): CK, MB: 2.5 ng/mL (ref 0.3–4.0)

## 2010-04-01 LAB — POCT I-STAT 3, ART BLOOD GAS (G3+)
Patient temperature: 97.5
TCO2: 24 mmol/L (ref 0–100)
pH, Arterial: 7.216 — ABNORMAL LOW (ref 7.350–7.400)

## 2010-04-01 LAB — POCT CARDIAC MARKERS
CKMB, poc: 1.3 ng/mL (ref 1.0–8.0)
CKMB, poc: <1 ng/mL — ABNORMAL LOW (ref 1.0–8.0)
Troponin i, poc: <0.05 ng/mL (ref 0.00–0.09)

## 2010-04-01 LAB — BASIC METABOLIC PANEL
BUN: 46 mg/dL — ABNORMAL HIGH (ref 6–23)
BUN: 48 mg/dL — ABNORMAL HIGH (ref 6–23)
BUN: 51 mg/dL — ABNORMAL HIGH (ref 6–23)
CO2: 31 mEq/L (ref 19–32)
Calcium: 9.1 mg/dL (ref 8.4–10.5)
Calcium: 9.3 mg/dL (ref 8.4–10.5)
Chloride: 103 mEq/L (ref 96–112)
Creatinine, Ser: 1.45 mg/dL — ABNORMAL HIGH (ref 0.4–1.2)
GFR calc Af Amer: 42 mL/min — ABNORMAL LOW (ref >60)
GFR calc non Af Amer: 29 mL/min — ABNORMAL LOW (ref >60)
GFR calc non Af Amer: 35 mL/min — ABNORMAL LOW (ref >60)
GFR calc non Af Amer: 36 mL/min — ABNORMAL LOW (ref >60)
Glucose, Bld: 129 mg/dL — ABNORMAL HIGH (ref 70–99)
Glucose, Bld: 87 mg/dL (ref 70–99)
Glucose, Bld: 88 mg/dL (ref 70–99)
Potassium: 4.1 mEq/L (ref 3.5–5.1)
Potassium: 4.1 mEq/L (ref 3.5–5.1)

## 2010-04-01 LAB — CARDIAC PANEL(CRET KIN+CKTOT+MB+TROPI)
CK, MB: 1.9 ng/mL (ref 0.3–4.0)
Total CK: 54 U/L (ref 7–177)
Troponin I: 0.02 ng/mL (ref 0.00–0.06)

## 2010-04-01 LAB — URINALYSIS, ROUTINE W REFLEX MICROSCOPIC
Bilirubin Urine: NEGATIVE
Hgb urine dipstick: NEGATIVE
Specific Gravity, Urine: 1.007 (ref 1.005–1.030)
Urobilinogen, UA: 0.2 mg/dL (ref 0.0–1.0)

## 2010-04-01 LAB — DIFFERENTIAL
Basophils Absolute: 0.1 10*3/uL (ref 0.0–0.1)
Basophils Relative: 1 % (ref 0–1)
Eosinophils Absolute: 0.2 10*3/uL (ref 0.0–0.7)
Monocytes Relative: 0 % — ABNORMAL LOW (ref 3–12)
Neutrophils Relative %: 73 % (ref 43–77)

## 2010-04-01 LAB — CK TOTAL AND CKMB (NOT AT ARMC)
CK, MB: 2.2 ng/mL (ref 0.3–4.0)
Relative Index: INVALID (ref 0.0–2.5)
Total CK: 63 U/L (ref 7–177)

## 2010-04-01 LAB — GLUCOSE, CAPILLARY: Glucose-Capillary: 164 mg/dL — ABNORMAL HIGH (ref 70–99)

## 2010-04-01 LAB — CBC
MCHC: 33.3 g/dL (ref 30.0–36.0)
MCV: 91.9 fL (ref 78.0–100.0)
Platelets: 370 10*3/uL (ref 150–400)
RDW: 16.4 % — ABNORMAL HIGH (ref 11.5–15.5)

## 2010-04-01 LAB — URINE MICROSCOPIC-ADD ON

## 2010-04-01 LAB — BRAIN NATRIURETIC PEPTIDE: Pro B Natriuretic peptide (BNP): 1480 pg/mL — ABNORMAL HIGH (ref 0.0–100.0)

## 2010-04-01 LAB — URINE CULTURE

## 2010-04-04 ENCOUNTER — Other Ambulatory Visit: Payer: Self-pay | Admitting: *Deleted

## 2010-04-04 DIAGNOSIS — I359 Nonrheumatic aortic valve disorder, unspecified: Secondary | ICD-10-CM

## 2010-04-04 MED ORDER — FUROSEMIDE 20 MG PO TABS
20.0000 mg | ORAL_TABLET | Freq: Every day | ORAL | Status: DC
Start: 2010-04-04 — End: 2011-05-07

## 2010-04-14 ENCOUNTER — Ambulatory Visit: Payer: MEDICARE | Admitting: Surgery

## 2010-04-17 LAB — CBC
HCT: 33.9 % — ABNORMAL LOW (ref 36.0–46.0)
Hemoglobin: 10.7 g/dL — ABNORMAL LOW (ref 12.0–15.0)
Hemoglobin: 11.4 g/dL — ABNORMAL LOW (ref 12.0–15.0)
MCHC: 33.4 g/dL (ref 30.0–36.0)
MCV: 91.7 fL (ref 78.0–100.0)
RBC: 3.46 MIL/uL — ABNORMAL LOW (ref 3.87–5.11)
RDW: 18.3 % — ABNORMAL HIGH (ref 11.5–15.5)
RDW: 18.6 % — ABNORMAL HIGH (ref 11.5–15.5)

## 2010-04-17 LAB — POCT I-STAT 3, ART BLOOD GAS (G3+)
O2 Saturation: 95 %
pCO2 arterial: 44.9 mmHg (ref 35.0–45.0)
pH, Arterial: 7.441 — ABNORMAL HIGH (ref 7.350–7.400)
pO2, Arterial: 73 mmHg — ABNORMAL LOW (ref 80.0–100.0)

## 2010-04-17 LAB — BODY FLUID CELL COUNT WITH DIFFERENTIAL
Eos, Fluid: 0 %
Lymphs, Fluid: 61 %
Monocyte-Macrophage-Serous Fluid: 32 % — ABNORMAL LOW (ref 50–90)

## 2010-04-17 LAB — BASIC METABOLIC PANEL
CO2: 29 mEq/L (ref 19–32)
Calcium: 8.7 mg/dL (ref 8.4–10.5)
Calcium: 8.8 mg/dL (ref 8.4–10.5)
Chloride: 97 mEq/L (ref 96–112)
Creatinine, Ser: 1.34 mg/dL — ABNORMAL HIGH (ref 0.4–1.2)
GFR calc Af Amer: 46 mL/min — ABNORMAL LOW (ref >60)
GFR calc non Af Amer: 38 mL/min — ABNORMAL LOW (ref >60)
Glucose, Bld: 138 mg/dL — ABNORMAL HIGH (ref 70–99)
Sodium: 135 mEq/L (ref 135–145)
Sodium: 138 mEq/L (ref 135–145)

## 2010-04-17 LAB — DIFFERENTIAL
Basophils Relative: 1 % (ref 0–1)
Monocytes Absolute: 0.7 10*3/uL (ref 0.1–1.0)
Monocytes Relative: 7 % (ref 3–12)
Neutro Abs: 6.1 10*3/uL (ref 1.7–7.7)

## 2010-04-17 LAB — POCT CARDIAC MARKERS
CKMB, poc: 2 ng/mL (ref 1.0–8.0)
Myoglobin, poc: 58.5 ng/mL (ref 12–200)

## 2010-04-17 LAB — BODY FLUID CULTURE: Culture: NO GROWTH

## 2010-04-17 LAB — APTT: aPTT: 28 seconds (ref 24–37)

## 2010-04-17 LAB — LACTATE DEHYDROGENASE, PLEURAL OR PERITONEAL FLUID: LD, Fluid: 173 U/L — ABNORMAL HIGH (ref 3–23)

## 2010-04-17 LAB — CK TOTAL AND CKMB (NOT AT ARMC): Relative Index: INVALID (ref 0.0–2.5)

## 2010-04-18 LAB — HEMOCCULT GUIAC POC 1CARD (OFFICE): Fecal Occult Bld: POSITIVE

## 2010-04-18 LAB — CBC
HCT: 21.4 % — ABNORMAL LOW (ref 36.0–46.0)
HCT: 25.2 % — ABNORMAL LOW (ref 36.0–46.0)
HCT: 26.4 % — ABNORMAL LOW (ref 36.0–46.0)
HCT: 26.5 % — ABNORMAL LOW (ref 36.0–46.0)
HCT: 26.7 % — ABNORMAL LOW (ref 36.0–46.0)
HCT: 26.7 % — ABNORMAL LOW (ref 36.0–46.0)
HCT: 29.1 % — ABNORMAL LOW (ref 36.0–46.0)
HCT: 25.4 % — ABNORMAL LOW (ref 36.0–46.0)
HCT: 26.5 % — ABNORMAL LOW (ref 36.0–46.0)
Hemoglobin: 7.4 g/dL — CL (ref 12.0–15.0)
Hemoglobin: 8.6 g/dL — ABNORMAL LOW (ref 12.0–15.0)
Hemoglobin: 8.7 g/dL — ABNORMAL LOW (ref 12.0–15.0)
Hemoglobin: 8.9 g/dL — ABNORMAL LOW (ref 12.0–15.0)
Hemoglobin: 9 g/dL — ABNORMAL LOW (ref 12.0–15.0)
Hemoglobin: 9 g/dL — ABNORMAL LOW (ref 12.0–15.0)
Hemoglobin: 9.1 g/dL — ABNORMAL LOW (ref 12.0–15.0)
Hemoglobin: 9.2 g/dL — ABNORMAL LOW (ref 12.0–15.0)
Hemoglobin: 9.7 g/dL — ABNORMAL LOW (ref 12.0–15.0)
Hemoglobin: 10.9 g/dL — ABNORMAL LOW (ref 12.0–15.0)
Hemoglobin: 7.2 g/dL — CL (ref 12.0–15.0)
Hemoglobin: 8.6 g/dL — ABNORMAL LOW (ref 12.0–15.0)
Hemoglobin: 9.2 g/dL — ABNORMAL LOW (ref 12.0–15.0)
MCHC: 33.8 g/dL (ref 30.0–36.0)
MCHC: 34.1 g/dL (ref 30.0–36.0)
MCHC: 34.1 g/dL (ref 30.0–36.0)
MCHC: 34.1 g/dL (ref 30.0–36.0)
MCHC: 34.2 g/dL (ref 30.0–36.0)
MCHC: 34.2 g/dL (ref 30.0–36.0)
MCHC: 34.2 g/dL (ref 30.0–36.0)
MCHC: 34.3 g/dL (ref 30.0–36.0)
MCHC: 34.5 g/dL (ref 30.0–36.0)
MCHC: 34.6 g/dL (ref 30.0–36.0)
MCHC: 34.8 g/dL (ref 30.0–36.0)
MCHC: 34.9 g/dL (ref 30.0–36.0)
MCHC: 35.2 g/dL (ref 30.0–36.0)
MCHC: 33.6 g/dL (ref 30.0–36.0)
MCHC: 34 g/dL (ref 30.0–36.0)
MCHC: 34 g/dL (ref 30.0–36.0)
MCHC: 34.1 g/dL (ref 30.0–36.0)
MCHC: 34.7 g/dL (ref 30.0–36.0)
MCV: 88.7 fL (ref 78.0–100.0)
MCV: 89.4 fL (ref 78.0–100.0)
MCV: 90 fL (ref 78.0–100.0)
MCV: 90.4 fL (ref 78.0–100.0)
MCV: 91.4 fL (ref 78.0–100.0)
MCV: 91.4 fL (ref 78.0–100.0)
MCV: 91.8 fL (ref 78.0–100.0)
MCV: 92.1 fL (ref 78.0–100.0)
MCV: 92.2 fL (ref 78.0–100.0)
MCV: 92.4 fL (ref 78.0–100.0)
MCV: 92.7 fL (ref 78.0–100.0)
MCV: 92.9 fL (ref 78.0–100.0)
MCV: 93.6 fL (ref 78.0–100.0)
MCV: 89.7 fL (ref 78.0–100.0)
MCV: 90.3 fL (ref 78.0–100.0)
Platelets: 111 10*3/uL — ABNORMAL LOW (ref 150–400)
Platelets: 203 10*3/uL (ref 150–400)
Platelets: 265 10*3/uL (ref 150–400)
Platelets: 300 10*3/uL (ref 150–400)
Platelets: 407 10*3/uL — ABNORMAL HIGH (ref 150–400)
Platelets: 88 10*3/uL — ABNORMAL LOW (ref 150–400)
Platelets: 93 10*3/uL — ABNORMAL LOW (ref 150–400)
Platelets: 403 10*3/uL — ABNORMAL HIGH (ref 150–400)
RBC: 2.28 MIL/uL — ABNORMAL LOW (ref 3.87–5.11)
RBC: 2.42 MIL/uL — ABNORMAL LOW (ref 3.87–5.11)
RBC: 2.85 MIL/uL — ABNORMAL LOW (ref 3.87–5.11)
RBC: 2.88 MIL/uL — ABNORMAL LOW (ref 3.87–5.11)
RBC: 2.88 MIL/uL — ABNORMAL LOW (ref 3.87–5.11)
RBC: 2.89 MIL/uL — ABNORMAL LOW (ref 3.87–5.11)
RBC: 2.9 MIL/uL — ABNORMAL LOW (ref 3.87–5.11)
RBC: 3.09 MIL/uL — ABNORMAL LOW (ref 3.87–5.11)
RBC: 3.27 MIL/uL — ABNORMAL LOW (ref 3.87–5.11)
RBC: 2.93 MIL/uL — ABNORMAL LOW (ref 3.87–5.11)
RBC: 3.53 MIL/uL — ABNORMAL LOW (ref 3.87–5.11)
RDW: 14.6 % (ref 11.5–15.5)
RDW: 14.8 % (ref 11.5–15.5)
RDW: 14.8 % (ref 11.5–15.5)
RDW: 14.9 % (ref 11.5–15.5)
RDW: 15.1 % (ref 11.5–15.5)
RDW: 15.3 % (ref 11.5–15.5)
RDW: 15.3 % (ref 11.5–15.5)
RDW: 15.6 % — ABNORMAL HIGH (ref 11.5–15.5)
RDW: 16.7 % — ABNORMAL HIGH (ref 11.5–15.5)
RDW: 16.9 % — ABNORMAL HIGH (ref 11.5–15.5)
RDW: 14.6 % (ref 11.5–15.5)
RDW: 14.9 % (ref 11.5–15.5)
RDW: 15.4 % (ref 11.5–15.5)
WBC: 12.9 10*3/uL — ABNORMAL HIGH (ref 4.0–10.5)
WBC: 14 10*3/uL — ABNORMAL HIGH (ref 4.0–10.5)
WBC: 15.2 10*3/uL — ABNORMAL HIGH (ref 4.0–10.5)
WBC: 16 10*3/uL — ABNORMAL HIGH (ref 4.0–10.5)
WBC: 17.1 10*3/uL — ABNORMAL HIGH (ref 4.0–10.5)
WBC: 9.5 10*3/uL (ref 4.0–10.5)

## 2010-04-18 LAB — POCT I-STAT 3, ART BLOOD GAS (G3+)
Acid-base deficit: 2 mmol/L (ref 0.0–2.0)
Acid-base deficit: 2 mmol/L (ref 0.0–2.0)
Bicarbonate: 22.4 mEq/L (ref 20.0–24.0)
Bicarbonate: 19.6 mEq/L — ABNORMAL LOW (ref 20.0–24.0)
Bicarbonate: 20.4 mEq/L (ref 20.0–24.0)
Bicarbonate: 23.7 mEq/L (ref 20.0–24.0)
O2 Saturation: 100 %
O2 Saturation: 92 %
O2 Saturation: 98 %
Patient temperature: 36.3
Patient temperature: 37
Patient temperature: 97.2
TCO2: 22 mmol/L (ref 0–100)
TCO2: 23 mmol/L (ref 0–100)
TCO2: 22 mmol/L (ref 0–100)
TCO2: 24 mmol/L (ref 0–100)
pCO2 arterial: 30.8 mmHg — ABNORMAL LOW (ref 35.0–45.0)
pCO2 arterial: 37.7 mmHg (ref 35.0–45.0)
pCO2 arterial: 29.6 mmHg — ABNORMAL LOW (ref 35.0–45.0)
pCO2 arterial: 35.6 mmHg (ref 35.0–45.0)
pCO2 arterial: 36.2 mmHg (ref 35.0–45.0)
pCO2 arterial: 38.3 mmHg (ref 35.0–45.0)
pCO2 arterial: 47.6 mmHg — ABNORMAL HIGH (ref 35.0–45.0)
pH, Arterial: 7.402 — ABNORMAL HIGH (ref 7.350–7.400)
pH, Arterial: 7.45 — ABNORMAL HIGH (ref 7.350–7.400)
pH, Arterial: 7.332 — ABNORMAL LOW (ref 7.350–7.400)
pH, Arterial: 7.417 — ABNORMAL HIGH (ref 7.350–7.400)
pH, Arterial: 7.462 — ABNORMAL HIGH (ref 7.350–7.400)
pO2, Arterial: 86 mmHg (ref 80.0–100.0)
pO2, Arterial: 117 mmHg — ABNORMAL HIGH (ref 80.0–100.0)
pO2, Arterial: 221 mmHg — ABNORMAL HIGH (ref 80.0–100.0)
pO2, Arterial: 260 mmHg — ABNORMAL HIGH (ref 80.0–100.0)
pO2, Arterial: 62 mmHg — ABNORMAL LOW (ref 80.0–100.0)

## 2010-04-18 LAB — URINE MICROSCOPIC-ADD ON

## 2010-04-18 LAB — PREPARE FRESH FROZEN PLASMA

## 2010-04-18 LAB — URINALYSIS, ROUTINE W REFLEX MICROSCOPIC
Bilirubin Urine: NEGATIVE
Glucose, UA: NEGATIVE mg/dL
Ketones, ur: NEGATIVE mg/dL
Nitrite: NEGATIVE
Nitrite: NEGATIVE
Protein, ur: 100 mg/dL — AB
Specific Gravity, Urine: <1.005 — ABNORMAL LOW (ref 1.005–1.030)
Urobilinogen, UA: 0.2 mg/dL (ref 0.0–1.0)
pH: 6 (ref 5.0–8.0)

## 2010-04-18 LAB — BLOOD GAS, ARTERIAL
Drawn by: 206361
FIO2: 0.21 %
O2 Saturation: 97.3 %
Patient temperature: 98.6
pO2, Arterial: 88.7 mmHg (ref 80.0–100.0)

## 2010-04-18 LAB — BASIC METABOLIC PANEL
BUN: 26 mg/dL — ABNORMAL HIGH (ref 6–23)
BUN: 43 mg/dL — ABNORMAL HIGH (ref 6–23)
BUN: 44 mg/dL — ABNORMAL HIGH (ref 6–23)
BUN: 48 mg/dL — ABNORMAL HIGH (ref 6–23)
BUN: 50 mg/dL — ABNORMAL HIGH (ref 6–23)
BUN: 51 mg/dL — ABNORMAL HIGH (ref 6–23)
BUN: 52 mg/dL — ABNORMAL HIGH (ref 6–23)
BUN: 54 mg/dL — ABNORMAL HIGH (ref 6–23)
BUN: 75 mg/dL — ABNORMAL HIGH (ref 6–23)
CO2: 24 mEq/L (ref 19–32)
CO2: 27 mEq/L (ref 19–32)
CO2: 28 mEq/L (ref 19–32)
CO2: 29 mEq/L (ref 19–32)
CO2: 30 mEq/L (ref 19–32)
CO2: 30 mEq/L (ref 19–32)
CO2: 30 mEq/L (ref 19–32)
CO2: 32 mEq/L (ref 19–32)
CO2: 33 mEq/L — ABNORMAL HIGH (ref 19–32)
CO2: 33 mEq/L — ABNORMAL HIGH (ref 19–32)
CO2: 34 mEq/L — ABNORMAL HIGH (ref 19–32)
CO2: 34 mEq/L — ABNORMAL HIGH (ref 19–32)
Calcium: 7.2 mg/dL — ABNORMAL LOW (ref 8.4–10.5)
Calcium: 7.3 mg/dL — ABNORMAL LOW (ref 8.4–10.5)
Calcium: 7.4 mg/dL — ABNORMAL LOW (ref 8.4–10.5)
Calcium: 8.1 mg/dL — ABNORMAL LOW (ref 8.4–10.5)
Calcium: 8.2 mg/dL — ABNORMAL LOW (ref 8.4–10.5)
Calcium: 8.4 mg/dL (ref 8.4–10.5)
Calcium: 8.6 mg/dL (ref 8.4–10.5)
Chloride: 100 mEq/L (ref 96–112)
Chloride: 101 mEq/L (ref 96–112)
Chloride: 101 mEq/L (ref 96–112)
Chloride: 102 mEq/L (ref 96–112)
Chloride: 103 mEq/L (ref 96–112)
Chloride: 95 mEq/L — ABNORMAL LOW (ref 96–112)
Chloride: 97 mEq/L (ref 96–112)
Chloride: 98 mEq/L (ref 96–112)
Chloride: 99 mEq/L (ref 96–112)
Chloride: 99 mEq/L (ref 96–112)
Chloride: 99 mEq/L (ref 96–112)
Creatinine, Ser: 1.28 mg/dL — ABNORMAL HIGH (ref 0.4–1.2)
Creatinine, Ser: 1.35 mg/dL — ABNORMAL HIGH (ref 0.4–1.2)
Creatinine, Ser: 1.37 mg/dL — ABNORMAL HIGH (ref 0.4–1.2)
Creatinine, Ser: 1.39 mg/dL — ABNORMAL HIGH (ref 0.4–1.2)
Creatinine, Ser: 1.58 mg/dL — ABNORMAL HIGH (ref 0.4–1.2)
Creatinine, Ser: 1.6 mg/dL — ABNORMAL HIGH (ref 0.4–1.2)
Creatinine, Ser: 1.6 mg/dL — ABNORMAL HIGH (ref 0.4–1.2)
Creatinine, Ser: 1.72 mg/dL — ABNORMAL HIGH (ref 0.4–1.2)
Creatinine, Ser: 1.78 mg/dL — ABNORMAL HIGH (ref 0.4–1.2)
Creatinine, Ser: 1.97 mg/dL — ABNORMAL HIGH (ref 0.4–1.2)
Creatinine, Ser: 2.1 mg/dL — ABNORMAL HIGH (ref 0.4–1.2)
Creatinine, Ser: 2.13 mg/dL — ABNORMAL HIGH (ref 0.4–1.2)
GFR calc Af Amer: 27 mL/min — ABNORMAL LOW (ref >60)
GFR calc Af Amer: 30 mL/min — ABNORMAL LOW (ref >60)
GFR calc Af Amer: 33 mL/min — ABNORMAL LOW (ref >60)
GFR calc Af Amer: 33 mL/min — ABNORMAL LOW (ref >60)
GFR calc Af Amer: 38 mL/min — ABNORMAL LOW (ref >60)
GFR calc Af Amer: 38 mL/min — ABNORMAL LOW (ref >60)
GFR calc Af Amer: 45 mL/min — ABNORMAL LOW (ref >60)
GFR calc Af Amer: 46 mL/min — ABNORMAL LOW (ref >60)
GFR calc Af Amer: 46 mL/min — ABNORMAL LOW (ref >60)
GFR calc non Af Amer: 22 mL/min — ABNORMAL LOW (ref >60)
GFR calc non Af Amer: 27 mL/min — ABNORMAL LOW (ref >60)
GFR calc non Af Amer: 27 mL/min — ABNORMAL LOW (ref >60)
GFR calc non Af Amer: 27 mL/min — ABNORMAL LOW (ref >60)
GFR calc non Af Amer: 28 mL/min — ABNORMAL LOW (ref >60)
GFR calc non Af Amer: 31 mL/min — ABNORMAL LOW (ref >60)
GFR calc non Af Amer: 31 mL/min — ABNORMAL LOW (ref >60)
GFR calc non Af Amer: 32 mL/min — ABNORMAL LOW (ref >60)
GFR calc non Af Amer: 38 mL/min — ABNORMAL LOW (ref >60)
Glucose, Bld: 101 mg/dL — ABNORMAL HIGH (ref 70–99)
Glucose, Bld: 102 mg/dL — ABNORMAL HIGH (ref 70–99)
Glucose, Bld: 113 mg/dL — ABNORMAL HIGH (ref 70–99)
Glucose, Bld: 119 mg/dL — ABNORMAL HIGH (ref 70–99)
Glucose, Bld: 121 mg/dL — ABNORMAL HIGH (ref 70–99)
Glucose, Bld: 121 mg/dL — ABNORMAL HIGH (ref 70–99)
Glucose, Bld: 126 mg/dL — ABNORMAL HIGH (ref 70–99)
Glucose, Bld: 126 mg/dL — ABNORMAL HIGH (ref 70–99)
Glucose, Bld: 211 mg/dL — ABNORMAL HIGH (ref 70–99)
Potassium: 3.8 mEq/L (ref 3.5–5.1)
Potassium: 4.5 mEq/L (ref 3.5–5.1)
Potassium: 4.9 mEq/L (ref 3.5–5.1)
Potassium: 5 mEq/L (ref 3.5–5.1)
Potassium: 5.1 mEq/L (ref 3.5–5.1)
Potassium: 5.1 mEq/L (ref 3.5–5.1)
Potassium: 5.2 mEq/L — ABNORMAL HIGH (ref 3.5–5.1)
Potassium: 5.2 mEq/L — ABNORMAL HIGH (ref 3.5–5.1)
Sodium: 134 mEq/L — ABNORMAL LOW (ref 135–145)
Sodium: 134 mEq/L — ABNORMAL LOW (ref 135–145)
Sodium: 135 mEq/L (ref 135–145)
Sodium: 136 mEq/L (ref 135–145)
Sodium: 137 mEq/L (ref 135–145)
Sodium: 138 mEq/L (ref 135–145)
Sodium: 139 mEq/L (ref 135–145)
Sodium: 139 mEq/L (ref 135–145)
Sodium: 139 mEq/L (ref 135–145)
Sodium: 141 mEq/L (ref 135–145)

## 2010-04-18 LAB — TYPE AND SCREEN

## 2010-04-18 LAB — GLUCOSE, CAPILLARY
Glucose-Capillary: 104 mg/dL — ABNORMAL HIGH (ref 70–99)
Glucose-Capillary: 105 mg/dL — ABNORMAL HIGH (ref 70–99)
Glucose-Capillary: 107 mg/dL — ABNORMAL HIGH (ref 70–99)
Glucose-Capillary: 111 mg/dL — ABNORMAL HIGH (ref 70–99)
Glucose-Capillary: 114 mg/dL — ABNORMAL HIGH (ref 70–99)
Glucose-Capillary: 118 mg/dL — ABNORMAL HIGH (ref 70–99)
Glucose-Capillary: 121 mg/dL — ABNORMAL HIGH (ref 70–99)
Glucose-Capillary: 124 mg/dL — ABNORMAL HIGH (ref 70–99)
Glucose-Capillary: 126 mg/dL — ABNORMAL HIGH (ref 70–99)
Glucose-Capillary: 126 mg/dL — ABNORMAL HIGH (ref 70–99)
Glucose-Capillary: 129 mg/dL — ABNORMAL HIGH (ref 70–99)
Glucose-Capillary: 130 mg/dL — ABNORMAL HIGH (ref 70–99)
Glucose-Capillary: 150 mg/dL — ABNORMAL HIGH (ref 70–99)
Glucose-Capillary: 170 mg/dL — ABNORMAL HIGH (ref 70–99)
Glucose-Capillary: 191 mg/dL — ABNORMAL HIGH (ref 70–99)
Glucose-Capillary: 229 mg/dL — ABNORMAL HIGH (ref 70–99)
Glucose-Capillary: 95 mg/dL (ref 70–99)
Glucose-Capillary: 119 mg/dL — ABNORMAL HIGH (ref 70–99)

## 2010-04-18 LAB — CROSSMATCH: ABO/RH(D): B POS

## 2010-04-18 LAB — COMPREHENSIVE METABOLIC PANEL
ALT: 15 U/L (ref 0–35)
AST: 25 U/L (ref 0–37)
Calcium: 9.8 mg/dL (ref 8.4–10.5)
GFR calc Af Amer: 49 mL/min — ABNORMAL LOW (ref >60)
Sodium: 135 mEq/L (ref 135–145)
Total Protein: 6.8 g/dL (ref 6.0–8.3)

## 2010-04-18 LAB — PROTIME-INR
INR: 1.3 (ref 0.00–1.49)
INR: 1 (ref 0.00–1.49)
INR: 1.9 — ABNORMAL HIGH (ref 0.00–1.49)
INR: 2 — ABNORMAL HIGH (ref 0.00–1.49)
INR: 2.1 — ABNORMAL HIGH (ref 0.00–1.49)

## 2010-04-18 LAB — POCT I-STAT GLUCOSE
Glucose, Bld: 92 mg/dL (ref 70–99)
Operator id: 3406
Operator id: 3406

## 2010-04-18 LAB — PREPARE PLATELETS

## 2010-04-18 LAB — POCT I-STAT 4, (NA,K, GLUC, HGB,HCT)
Glucose, Bld: 121 mg/dL — ABNORMAL HIGH (ref 70–99)
Glucose, Bld: 100 mg/dL — ABNORMAL HIGH (ref 70–99)
Glucose, Bld: 119 mg/dL — ABNORMAL HIGH (ref 70–99)
Glucose, Bld: 147 mg/dL — ABNORMAL HIGH (ref 70–99)
Glucose, Bld: 157 mg/dL — ABNORMAL HIGH (ref 70–99)
Glucose, Bld: 96 mg/dL (ref 70–99)
HCT: 20 % — ABNORMAL LOW (ref 36.0–46.0)
HCT: 21 % — ABNORMAL LOW (ref 36.0–46.0)
HCT: 21 % — ABNORMAL LOW (ref 36.0–46.0)
HCT: 25 % — ABNORMAL LOW (ref 36.0–46.0)
HCT: 32 % — ABNORMAL LOW (ref 36.0–46.0)
Hemoglobin: 10.9 g/dL — ABNORMAL LOW (ref 12.0–15.0)
Hemoglobin: 6.8 g/dL — CL (ref 12.0–15.0)
Hemoglobin: 7.1 g/dL — CL (ref 12.0–15.0)
Hemoglobin: 7.1 g/dL — CL (ref 12.0–15.0)
Potassium: 3.4 mEq/L — ABNORMAL LOW (ref 3.5–5.1)
Potassium: 4.3 mEq/L (ref 3.5–5.1)
Potassium: 4.3 mEq/L (ref 3.5–5.1)
Potassium: 4.6 mEq/L (ref 3.5–5.1)
Potassium: 5 mEq/L (ref 3.5–5.1)
Sodium: 134 mEq/L — ABNORMAL LOW (ref 135–145)
Sodium: 136 mEq/L (ref 135–145)
Sodium: 137 mEq/L (ref 135–145)

## 2010-04-18 LAB — URINALYSIS, MICROSCOPIC ONLY
Glucose, UA: NEGATIVE mg/dL
Ketones, ur: NEGATIVE mg/dL
Protein, ur: 100 mg/dL — AB

## 2010-04-18 LAB — POCT I-STAT, CHEM 8
Calcium, Ion: 1.15 mmol/L (ref 1.12–1.32)
Creatinine, Ser: 1.6 mg/dL — ABNORMAL HIGH (ref 0.4–1.2)
Glucose, Bld: 148 mg/dL — ABNORMAL HIGH (ref 70–99)
HCT: 22 % — ABNORMAL LOW (ref 36.0–46.0)
Hemoglobin: 7.5 g/dL — CL (ref 12.0–15.0)
Potassium: 5.2 mEq/L — ABNORMAL HIGH (ref 3.5–5.1)
TCO2: 27 mmol/L (ref 0–100)

## 2010-04-18 LAB — CREATININE, SERUM
Creatinine, Ser: 1.59 mg/dL — ABNORMAL HIGH (ref 0.4–1.2)
GFR calc Af Amer: 38 mL/min — ABNORMAL LOW (ref >60)
GFR calc non Af Amer: 31 mL/min — ABNORMAL LOW (ref >60)
GFR calc non Af Amer: 42 mL/min — ABNORMAL LOW (ref >60)

## 2010-04-18 LAB — APTT
aPTT: 27 seconds (ref 24–37)
aPTT: 43 seconds — ABNORMAL HIGH (ref 24–37)
aPTT: 48 seconds — ABNORMAL HIGH (ref 24–37)

## 2010-04-18 LAB — DIC (DISSEMINATED INTRAVASCULAR COAGULATION)PANEL
D-Dimer, Quant: 0.45 ug/mL-FEU (ref 0.00–0.48)
Fibrinogen: 219 mg/dL (ref 204–475)
Platelets: 113 10*3/uL — ABNORMAL LOW (ref 150–400)
Smear Review: NONE SEEN
aPTT: 35 seconds (ref 24–37)

## 2010-04-18 LAB — PLATELET COUNT: Platelets: 110 10*3/uL — ABNORMAL LOW (ref 150–400)

## 2010-04-18 LAB — URINE CULTURE
Colony Count: >=100000
Colony Count: NO GROWTH

## 2010-04-18 LAB — DIFFERENTIAL
Eosinophils Relative: 0 % (ref 0–5)
Lymphocytes Relative: 5 % — ABNORMAL LOW (ref 12–46)
Lymphs Abs: 0.6 10*3/uL — ABNORMAL LOW (ref 0.7–4.0)
Monocytes Absolute: 1 10*3/uL (ref 0.1–1.0)

## 2010-04-18 LAB — PREPARE RBC (CROSSMATCH)

## 2010-04-18 LAB — MAGNESIUM: Magnesium: 3.2 mg/dL — ABNORMAL HIGH (ref 1.5–2.5)

## 2010-04-18 LAB — HEMOGLOBIN AND HEMATOCRIT, BLOOD: HCT: 26.4 % — ABNORMAL LOW (ref 36.0–46.0)

## 2010-04-20 LAB — CBC
HCT: 35.6 % — ABNORMAL LOW (ref 36.0–46.0)
HCT: 38.2 % (ref 36.0–46.0)
Hemoglobin: 13 g/dL (ref 12.0–15.0)
MCHC: 34.1 g/dL (ref 30.0–36.0)
MCV: 90.3 fL (ref 78.0–100.0)
Platelets: 276 10*3/uL (ref 150–400)
RDW: 14.6 % (ref 11.5–15.5)
RDW: 15 % (ref 11.5–15.5)
WBC: 8 10*3/uL (ref 4.0–10.5)

## 2010-04-20 LAB — POCT I-STAT 3, ART BLOOD GAS (G3+)
Acid-base deficit: 1 mmol/L (ref 0.0–2.0)
Bicarbonate: 23.9 mEq/L (ref 20.0–24.0)
O2 Saturation: 100 %
TCO2: 25 mmol/L (ref 0–100)
pO2, Arterial: 334 mmHg — ABNORMAL HIGH (ref 80.0–100.0)

## 2010-04-20 LAB — COMPREHENSIVE METABOLIC PANEL
AST: 489 U/L — ABNORMAL HIGH (ref 0–37)
Alkaline Phosphatase: 50 U/L (ref 39–117)
Alkaline Phosphatase: 57 U/L (ref 39–117)
BUN: 11 mg/dL (ref 6–23)
CO2: 25 mEq/L (ref 19–32)
Chloride: 103 mEq/L (ref 96–112)
Chloride: 99 mEq/L (ref 96–112)
Creatinine, Ser: 0.98 mg/dL (ref 0.4–1.2)
GFR calc Af Amer: >60 mL/min (ref >60)
GFR calc non Af Amer: 55 mL/min — ABNORMAL LOW (ref >60)
GFR calc non Af Amer: >60 mL/min (ref >60)
Glucose, Bld: 116 mg/dL — ABNORMAL HIGH (ref 70–99)
Potassium: 4.2 mEq/L (ref 3.5–5.1)
Total Bilirubin: 0.6 mg/dL (ref 0.3–1.2)
Total Bilirubin: 0.6 mg/dL (ref 0.3–1.2)

## 2010-04-20 LAB — POCT I-STAT, CHEM 8
Calcium, Ion: 1.21 mmol/L (ref 1.12–1.32)
Glucose, Bld: 114 mg/dL — ABNORMAL HIGH (ref 70–99)
HCT: 37 % (ref 36.0–46.0)
Hemoglobin: 12.6 g/dL (ref 12.0–15.0)

## 2010-04-20 LAB — CK TOTAL AND CKMB (NOT AT ARMC): CK, MB: 30.4 ng/mL — ABNORMAL HIGH (ref 0.3–4.0)

## 2010-04-20 LAB — LIPID PANEL
Cholesterol: 227 mg/dL — ABNORMAL HIGH (ref 0–200)
Total CHOL/HDL Ratio: 3.4 RATIO
VLDL: 10 mg/dL (ref 0–40)

## 2010-04-20 LAB — BASIC METABOLIC PANEL
Chloride: 96 mEq/L (ref 96–112)
GFR calc Af Amer: >60 mL/min (ref >60)
GFR calc Af Amer: >60 mL/min (ref >60)
GFR calc non Af Amer: 55 mL/min — ABNORMAL LOW (ref >60)
GFR calc non Af Amer: 57 mL/min — ABNORMAL LOW (ref >60)
Glucose, Bld: 94 mg/dL (ref 70–99)
Potassium: 4.2 mEq/L (ref 3.5–5.1)
Potassium: 4.3 mEq/L (ref 3.5–5.1)
Sodium: 132 mEq/L — ABNORMAL LOW (ref 135–145)
Sodium: 133 mEq/L — ABNORMAL LOW (ref 135–145)

## 2010-04-20 LAB — PROTIME-INR
INR: 1 (ref 0.00–1.49)
Prothrombin Time: 13.8 seconds (ref 11.6–15.2)

## 2010-05-05 ENCOUNTER — Ambulatory Visit (INDEPENDENT_AMBULATORY_CARE_PROVIDER_SITE_OTHER): Payer: MEDICARE | Admitting: Surgery

## 2010-05-05 ENCOUNTER — Encounter (INDEPENDENT_AMBULATORY_CARE_PROVIDER_SITE_OTHER): Payer: MEDICARE

## 2010-05-05 DIAGNOSIS — Z01810 Encounter for preprocedural cardiovascular examination: Secondary | ICD-10-CM

## 2010-05-05 DIAGNOSIS — N186 End stage renal disease: Secondary | ICD-10-CM

## 2010-05-05 DIAGNOSIS — N184 Chronic kidney disease, stage 4 (severe): Secondary | ICD-10-CM

## 2010-05-06 NOTE — Assessment & Plan Note (Signed)
OFFICE VISIT  [name], [name] DOB:  1927-10-31                                       05/05/2010 ZOXWR#:[id number]  REASON FOR ACCESS:  Dialysis access.  HISTORY:  This is a very pleasant 77 year old female I am seeing at the request for Dr. Arrie Aran for evaluation for dialysis access.  The patient suffers from stage IV chronic kidney disease secondary to hypertensive nephrosclerosis.  She is right-handed.  She has a solitary kidney.  She suffers from hypertension, hypercholesterolemia as well as coronary artery disease which is medically managed.  She is status post coronary artery bypass graft in 2012.  REVIEW OF SYSTEMS:  Negative except as above as documented in the encounter form.  PAST MEDICAL HISTORY:  Hypertension, hypercholesterolemia, coronary artery disease, congestive heart failure.  SOCIAL HISTORY:  She is widowed with 3 children.  Quit smoking in 2010.  FAMILY HISTORY:  She had a brother who died of heart disease in his 34s, a sister with heart disease.  ALLERGIES:  None.  PHYSICAL EXAMINATION:  Vital signs:  Heart rate 73, blood pressure 150/74, O2 sats 97%, respiratory rate 16.  General:  She is well- appearing, in no distress.  HEENT:  Within normal limits.  Lungs:  Clear bilaterally.  Cardiovascular:  Regular rhythm.  She has a palpable left brachial and left radial pulse.  Musculoskeletal:  Without major deformity.  Neurological:  She has no focal deficits.  Skin:  Without rash.  DIAGNOSTIC STUDIES:  Vein mapping was performed today and it shows her to have adequate left cephalic vein being 0.27 in the distal forearm and then being greater than 0.3 the rest of the arm.  I discussed proceeding with left radiocephalic fistula placement.  We discussed the risk of nonmaturity and the need for future interventions. She is a little reluctant to proceed with operation as she is struggling as to whether or not she wants to have  dialysis.  She is going to get her blood drawn on May 2 at Dr. Hadley Pen office and hopefully this will help make her decision.  I strongly encouraged her to go ahead and get this done so as to avoid any potential catheters.  All of her questions were answered today and I have scheduled her operation for May 11.    Jorge Ny, MD Electronically Signed  VWB/MEDQ  D:  05/05/2010  T:  05/06/2010  Job:  [id number]  cc:   Terrial Rhodes, M.D.

## 2010-05-13 ENCOUNTER — Encounter: Payer: Self-pay | Admitting: Cardiology

## 2010-05-20 NOTE — Procedures (Unsigned)
CEPHALIC VEIN MAPPING  INDICATION:  Chronic kidney disease stage IV.  HISTORY: Carotid stenosis, previous smoker, hypertension, hyperlipidemia and coronary artery disease.  EXAM: The right cephalic vein is compressible with diameter measurements ranging from 0.51 cm to 0.22 cm.  The right basilic vein is compressible with diameter measurements ranging from 0.54 cm to 0.39 cm.  The left cephalic vein is compressible with diameter measurements ranging from 0.66 cm to 0.27 cm.  The left basilic vein is compressible with diameter measurements ranging from 0.44 cm to 0.36 cm.  See attached worksheet for all measurements.  IMPRESSION: 1. Patent bilateral cephalic and basilic veins with diameter     measurements as described above. 2. Branch present in the mid distal forearm and proximal mid forearm     arising off the right cephalic vein. 3. Branch present in the left mid upper arm segment arising off the     cephalic vein.  ___________________________________________ V. Charlena Cross, MD  SH/MEDQ  D:  05/05/2010  T:  05/05/2010  Job:  [id number]

## 2010-05-27 NOTE — Assessment & Plan Note (Signed)
OFFICE VISIT   [name], [name]  DOB:  25-Nov-1927                                        September 07, 2008  CHART #:  [id number]   The patient is a 77 year old woman who presented back in July with a  chief complaint of chest pain.  She had been well up until that point in  time.  She had an inferior ST elevation MI.  She was taken urgently to  the catheterization laboratory by Dr. Shawnie Pons and had PTCA and  stent placement for a totally occluded right coronary with a good  result.  She had some residual disease including a diagonal branch to  the LAD.  The LAD itself did not have any critical stenoses.  She also  had a complex bifurcation lesion in the circumflex.  Other notable  findings included 1 to 2+ mitral regurgitation and ascending aortic  aneurysm, and 1 to 2+ aortic insufficiency.  The aneurysm by CT measured  4.9 cm in diameter.  I saw her in consultation.  At that time, we  discussed interval surgery for coronary bypass grafting of the obtuse  marginals and diagonal and replacement of her ascending aorta.  This  aneurysm does extend to the level of the innominate and would require a  distal anastomosis between the innominate and left common carotid  artery.  She now returns for further discussion.   She states that in the interim since her angioplasty, she has not had  any additional chest pain, shortness of breath that she really has felt  well.   CURRENT MEDICATIONS:  1. Lisinopril 10 mg daily.  2. Plavix 75 mg daily.  3. Aspirin 325 mg daily.  4. Prilosec 40 mg daily.  5. Crestor 40 mg daily.  6. Metoprolol 25 mg daily.  7. She has a prescription for p.r.n. nitroglycerin, but is not taking      any of it.   ALLERGIES:  She has no known drug allergies.   PAST MEDICAL HISTORY:  Significant for hypertension, COPD, claudication,  and ongoing tobacco use.   FAMILY HISTORY:  Positive for coronary artery disease in multiple  relatives.   SOCIAL HISTORY:  She is retired.  She is widowed.  She is smoking, was  smoking up to the time of admission.  She states she has not smoked  since her heart attack.   REVIEW OF SYSTEMS:  All other systems are negative.   PHYSICAL EXAMINATION:  Vital Signs:  Her blood pressure is 191/98, pulse  79, respirations 18, and her oxygen saturation is 94% on room air.  Neurological:  She is alert and oriented x3 with no focal deficits.  General:  She is very thin, appears her stated age.  Cardiac:  Has  regular rate and rhythm.  Normal S1 and S2.  There is a faint 2/6  murmur.  Lungs:  Clear with equal breath sounds bilaterally.  She has no  peripheral edema.   The patient's catheterization and CT were reviewed from her last  admission.   IMPRESSION:  The patient is a 77 year old woman with a 4.9 cm ascending  aortic aneurysm and residual two-vessel coronary disease, who recently  had an inferior myocardial infarction secondary to right coronary  occlusion.  She was treated appropriately with angioplasty and stent  placement with a good result  and has done well since that time.  I  discussed with her our options regarding the associated findings  including the aortic aneurysm and the residual coronary artery disease.  She understands that if either is treated surgically, then both should  be treated simultaneously.  We discussed the options including medical  management or proceeding with surgery.  She does wish to proceed with  surgery at this time.  She does understand that this is a major  operation, that it will require deep hypothermic circulatory arrest.  She understands the risks include, but are not limited to death, stroke,  myocardial infarction, deep venous thrombosis, pulmonary embolism,  bleeding, need for transfusions, infections, respiratory, or renal  failure, or GI complications as well as possible unforeseeable  complications.  She does understand there are  sometimes neurologic side  effects other than stroke that can occur with circulatory arrest.  She  understands these risks and does wish to proceed.  We would like to give  her a little additional time.  We will tentatively plan for surgery on  Wednesday, September 15.  We will discuss with Dr. Riley Kill when it will  be safe to hold her Plavix prior to surgery to make sure that we do not  have issues from that standpoint.   Salvatore Decent Dorris Fetch, M.D.    SCH/MEDQ  D:  09/07/2008  T:  09/08/2008  Job:  [id number]   cc:   Arturo Morton. Riley Kill, MD, [facility]  Veverly Fells. Al[place], M.D.

## 2010-05-27 NOTE — Discharge Summary (Signed)
NAMEKESHONA, KARTES           ACCOUNT NO.:  [id number]   MEDICAL RECORD NO.:  [id number]          PATIENT TYPE:  INP   LOCATION:  2009                         FACILITY:  [facility]   PHYSICIAN:  Arturo Morton. Riley Kill, MD, FACCDATE OF BIRTH:  Oct 02, 1927   DATE OF ADMISSION:  07/30/2008  DATE OF DISCHARGE:  08/03/2008                               DISCHARGE SUMMARY   PRIMARY CARDIOLOGIST:  Maisie Fus D. Riley Kill, MD, [facility]   DISCHARGE DIAGNOSES:  1. Coronary artery disease/code STEMI with an acute inferior posterior      ST elevation myocardial infarction.  The patient underwent cardiac      catheterization with percutaneous coronary intervention on date of      admission.  This was done urgently.  The patient was found to have      total occlusion of the right coronary artery with left-to-right      collaterals and successful reperfusion with the first device time      of 33 minutes.  The patient was noted to have scattered moderately      severe disease involving the left coronary system.  Moderate      reduction in left ventricular function with mild mitral      regurgitation compatible with acute inferior wall infarction.      Aortic root enlargement compatible with untreated hypertension.  2. Abnormal chest CT showing ectasia of the ascending aorta without      focal aneurysm.  Large hiatal hernia.  Mild pulmonary atelectasis      and scarring.  Otherwise, no suspicious pulmonary findings.      Bilateral thyroid nodularity.  3. Abnormal thyroid ultrasound dominant right inferior thyroid cold      nodule.  By consensus criteria ultrasound-guided fine needle      aspiration recommended as an outpatient.  At the time of discharge,      the patient to follow up with Dr. Leslie Dales for further evaluation.   PAST MEDICAL HISTORY:  1. Hypertension.  2. Presumed COPD.  3. Presumed peripheral arterial disease with claudication.  Note the      patient has not seen a doctor in many years prior to  this      admission.  4. Ongoing tobacco abuse.   CONSULTATIONS:  Dr. Charlett Lango on August 02, 2008.   HOSPITAL COURSE:  Ms. Orama is a 77 year old female who presented with  a chief complaint of chest discomfort that had been occurring several  days leading up to her admission.  The patient without previous medical  care for several years in her own choosing with ongoing tobacco abuse,  hypertension and presumed COPD, presumed peripheral arterial disease.  On day of admission, she developed severe crushing substernal chest  discomfort.  She called EMS.  EKG showed inferior ST elevation.  The  patient was treated with aspirin and morphine and brought to the cardiac  catheterization lab.  She had persistent ST elevation.  She underwent  cardiac catheterization was found to have totally occluded right  coronary artery.  Dr. Riley Kill performed PTCA and stenting with non drug-  eluting stent and there was  an excellent result with good flow into the  right coronary artery which had minimal residual disease.  The patient  was also noted to have dilated ascending aorta 1 to 2+ aortic  insufficiency and 1 to 2+ mitral regurgitation.  The patient was also  markedly hypertensive on admission.  In addition to her right coronary  artery disease, she was noted to have 70% lesion leading into the  bifurcation in her circumflex a 30% LAD stenosis after the takeoff of a  large branching diagonal, which itself had significant stenosis.  The  patient tolerated the procedure without complications and remained  stable.  Medications were adjusted to optimize blood pressure control.  Continue her on Lopressor, Crestor, lisinopril, aspirin and Plavix along  with Pepcid.  Dr. Riley Kill spoke with Dr. Dorris Fetch in consultation for  further evaluation of the ascending aneurysm, coronary artery bypass  grafting and the possibility also that the aortic valve may need to be  replaced.  Dr. Dorris Fetch saw  the patient in consultation on August 02, 2008.  At that time, the patient had been educated on the risks and  benefits of procedure.  She is uncertain at this time as to whether she  wants to proceed with the surgery.  In any event, she will be managed  medically in the interim.  Dr. Dorris Fetch stated that we consider  surgery probably a 6-week interval after the patient has had a chance to  endothelialize her stent and the Plavix potentially be discontinued.  The patient also noted to have an abnormal thyroid scan picked up by CT  scan initially.  The patient will be followed up with Dr. Leslie Dales for  further evaluation.  On day of discharge, the patient doing quite well,  has had cardiac rehabilitation education and has been seen by Dr.  Riley Kill.  Blood pressure 122/69, heart rate 72.   MOST RECENT LABORATORY DATA:  Potassium 4.3, creatinine 0.97.   At time of discharge the patient's medications include:  1. Toprol-XL 25 mg daily.  2. Nitroglycerin sublingual p.r.n.  3. Crestor 40 mg daily.  4. Lisinopril 5 mg daily.  5. Plavix 75 mg daily.  6. Pepcid 40 mg daily over-the-counter.  7. Aspirin 325 mg daily over-the-counter.   The patient has been given prescriptions for the Toprol, nitroglycerin,  Plavix, lisinopril and Crestor.  She has also been given post cardiac  catheterization discharge instructions.  She is not clear to drive for 2  weeks and no lifting for 1 week.  She will follow up with Wende Bushy  for Dr. Riley Kill on August 22, 2008 at 8:15.  The patient will call and  schedule her appointment with Dr. Leslie Dales at her convenience as this  for preference.   DURATION OF DISCHARGE ENCOUNTER:  Over 30 minutes.      Dorian Pod, ACNP      Arturo Morton. Riley Kill, MD, [facility]  Electronically Signed    MB/MEDQ  D:  08/03/2008  T:  08/04/2008  Job:  [id number]   cc:   Veverly Fells. Altheimer, M.D.  Salvatore Decent Dorris Fetch, M.D.

## 2010-05-27 NOTE — Consult Note (Signed)
NEW PATIENT CONSULTATION   [name], [name]  DOB:  Nov 17, 1927                                       08/22/2009  ZOXWR#:[id number]   I saw the patient in the office today in consultation concerning her  bilateral carotid disease.  She was referred by Dr. Riley Kill.  This is a  pleasant 77 year old right-handed woman who has had some known carotid  disease which is followed in the [place] office.  On her most recent  followup carotid duplex scan she was noted to have bilateral 60% to 79%  carotid stenoses.  She was sent for vascular consultation.  Of note, she  denies any history of stroke, TIAs, expressive or receptive aphasia or  amaurosis fugax.  She has had no problems with dizziness.   She does also complain of bilateral calf claudication which is brought  on by ambulation and relieved with rest.  This occurs when she is  walking around [place].  Her symptoms have been relatively stable over  the last 6 months.  She has had no history of rest pain and no history  of nonhealing wounds.  She does note some occasional paresthesias in her  feet.   Her past medical history is significant for hypertension,  hypercholesterolemia and coronary artery disease.  She underwent  coronary revascularization by Dr. Dorris Fetch in July of 2010.  She has  no history of congestive heart failure or history of COPD.   SOCIAL HISTORY:  She is widowed.  She has three children.  She quit  tobacco when she had her heart surgery in July of 2010.  She had been  smoking one pack per day.   FAMILY HISTORY:  She had a brother who died with coronary artery disease  in his 20s.  She has a sister who has had coronary disease.  She is not  aware of any other history of premature cardiovascular disease.   REVIEW OF SYSTEMS:  GENERAL:  She has had no recent weight loss, weight  gain or problems with her appetite.  CARDIOVASCULAR:  She has had no chest pain, chest pressure, palpitations  or arrhythmias.  She has had no history of stroke, TIAs or amaurosis  fugax.  She has had no history of DVT or phlebitis.  GI, neurologic, pulmonary, hematologic, GU, ENT, psychiatric and  integumentary review of systems is unremarkable.  MUSCULOSKELETAL:  She does have a history of some arthritis in her hip  but this has not been especially symptomatic.   PHYSICAL EXAMINATION:  General:  This is a pleasant 77 year old woman  who appears her stated age.  Vital signs:  Her blood pressure is 157/82,  heart rate is 79, saturation 99%.  HEENT:  Unremarkable.  Lungs:  Are  clear bilaterally to auscultation without rales, rhonchi or wheezing.  Cardiovascular:  She has a left carotid bruit and a very soft right  carotid bruit.  She has palpable femoral pulses.  I cannot palpate  popliteal or pedal pulses.  She has no significant lower extremity  edema.  Extremity:  Exam reveals significant varicose veins bilaterally.  Abdomen:  Soft and nontender with normal pitched bowel sounds.  No  masses appreciated.  Musculoskeletal:  There are no major deformities.  Neurological:  Shows no focal weakness or paresthesias.  Skin:  There  are no ulcers or rashes.  I have reviewed her carotid duplex scan at the [facility] office which  showed bilateral 60% to 79% carotid stenoses by velocity criteria.  End  diastolic velocity bilaterally was only approximately 60 and so these  are in the mid range.  Of note, both vertebral arteries were patent with  normally directed flow.   I have explained that for the asymptomatic patient we would not consider  carotid endarterectomy unless the stenosis progressed to greater than  80%.  We have reviewed the potential symptoms of cerebrovascular  disease.  She knows if she develops any such symptoms we would consider  carotid endarterectomy sooner.  She is on aspirin.  She has routine  duplex scans at the [facility] office at 6 month intervals and I would  agree with  continuing this.  We certainly would be happy to see her back  at any time if the stenosis progressed to greater than 80% or she  developed new neurologic symptoms.   With respect to her claudication we have encouraged her to stay as  active as possible and she has also fortunately quit smoking.  Again, I  would not recommend any further vascular workup for this at this time.     Di Kindle. Edilia Bo, M.D.  Electronically Signed   CSD/MEDQ  D:  08/22/2009  T:  08/23/2009  Job:  [id number]   cc:   Arturo Morton. Riley Kill, MD, [facility]

## 2010-05-27 NOTE — H&P (Signed)
[name], [name]           ACCOUNT NO.:  [id number]   MEDICAL RECORD NO.:  [id number]          PATIENT TYPE:  INP   LOCATION:  2807                         FACILITY:  [facility]   PHYSICIAN:  Bevelyn Buckles. Bensimhon, MDDATE OF BIRTH:  January 04, 1928   DATE OF ADMISSION:  07/30/2008  DATE OF DISCHARGE:                              HISTORY & PHYSICAL   PRIMARY CARE PHYSICIAN:  None.   CARDIOLOGY:  She is new to [facility].   REASON FOR ADMISSION:  Inferior-posterior acute ST elevation myocardial  infarction.   HISTORY OF PRESENT ILLNESS:  [name] is a 77 year old woman with  history of presumed COPD and ongoing tobacco use, hypertension.  She  denies any known history of coronary artery disease or other problems.  She says she has only been to the doctor once in February and other that  she has not had medical followup.   For the past several days, she has had several episodes of chest pain.  This has resolved spontaneously.  Unfortunately this morning between 8  and 8:30 in the morning, she developed severe chest pain which  persisted.  EMS was activated.  EKG on the scene showed inferior ST  elevation with posterior involvement.  She was treated in the field with  aspirin and morphine and brought to the cardiac catheterization lab  emergently.  On arrival, she was pain-free but had persistent ST  elevation.   REVIEW OF SYSTEMS:  She notes significant claudication which has limited  her mobility.  Otherwise, she says she gets around fairly well.  She  denies any history of known diabetes.  No neurologic symptoms.  She has  not had any fevers, chills, nausea, vomiting.  No recent bleeding.  No  melena.  No bright red blood per rectum.  The remainder of the review of  systems are negative except as per HPI and problem list.   PAST MEDICAL HISTORY:  1. Hypertension.  2. Presumed COPD with ongoing tobacco use.  3. Exertional leg pain concerning for claudication and  peripheral      arterial disease.   CURRENT MEDICATIONS:  None.   ALLERGIES:  NO KNOWN DRUG ALLERGIES.   SOCIAL HISTORY:  She is lives in [place].  She is retired.  She  is widowed.  She has a history of tobacco use but does not quantify.  She said she stopped smoking this morning.  She has been trying to quit  for several weeks now.   FAMILY HISTORY:  There is an extensive family history of coronary artery  disease.  Her mother had significant coronary artery disease as well as  her brother.  There are several other family members with coronary  disease.   PHYSICAL EXAM:  She is lying on the catheterization table.  She is in no  acute distress.  Respirations are unlabored.  Blood pressure is 208/123,  heart rate is 103.  She is satting 100% on 2 liters nasal cannula.  HEENT:  Unremarkable.  NECK:  Supple.  No obvious JVD.  Carotids are 1+ bilaterally.  I am  unable to hear any bruits over  the ambient noise.  There is no  lymphadenopathy or thyromegaly appreciated.  CARDIAC:  PMI is nonpalpable.  She is regular and tachycardiac with no  obvious murmurs, rubs, or gallops.  LUNGS:  Clear anteriorly with mildly decreased air movement.  There is  no wheezing.  ABDOMEN:  Soft, nontender, nondistended.  No obvious splenomegaly.  EXTREMITIES:  Warm.  There is no edema.  No cyanosis.  Her DP pulse is  2+ on the left and nonpalpable on the right.  NEURO:  Alert and oriented x3.  Cranial nerves II-XII are intact.  Moves  all 4 extremities without difficulty.  Affect is pleasant.   EKG shows sinus rhythm with LVH.  There is 3-5 mm ST elevation in the  inferior leads with ST depression in V2 and V3 concerning for posterior  involvement.  Labs are currently unavailable.   ASSESSMENT:  1. Acute inferior-posterior ST elevation myocardial infarction.  2. Hypertension.  3. Exertional leg pain concerning for claudication.  4. Tobacco use ongoing.   PLAN/DISCUSSION:  She is currently  undergoing emergent cardiac  catheterization with Dr. Riley Kill.  Further plan as determined by the  results of the catheterization.  She will need treatment of her  hypertension and further workup regarding possible peripheral arterial  disease.  We will get a smoking cessation consult.      Bevelyn Buckles. Bensimhon, MD  Electronically Signed     DRB/MEDQ  D:  07/30/2008  T:  07/30/2008  Job:  [id number]

## 2010-05-27 NOTE — Cardiovascular Report (Signed)
[name], [name]           ACCOUNT NO.:  [id number]   MEDICAL RECORD NO.:  [id number]          PATIENT TYPE:  INP   LOCATION:  2913                         FACILITY:  [facility]   PHYSICIAN:  Arturo Morton. Riley Kill, MD, FACCDATE OF BIRTH:  1927-10-02   DATE OF PROCEDURE:  07/30/2008  DATE OF DISCHARGE:                            CARDIAC CATHETERIZATION   INDICATIONS:  [name] is a 77 year old who presents with an acute  inferior wall infarction.  She had earlier onset of chest pain, was  brought in by [facility] to the cath lab directly where  she was met by Dr. Gala Romney.  The patient has a history of  hypertension, but has not been taking her medications for the past  several years and has continued to smoke.  She says she recently  stopped.  She was given aspirin in the field and morphine.  She was not  given any other medications.   PROCEDURE:  1. Left heart catheterization.  2. Selective coronary arteriography.  3. Selective left ventriculography.  4. Aortic root aortography.  5. Aspiration thrombectomy.  6. Percutaneous stenting of the right coronary artery using a non-drug-      eluting platform.   DESCRIPTION OF THE PROCEDURE:  The patient was brought to the  catheterization laboratory in the setting of an acute inferior wall  myocardial infarction.  She had a diagnostic electrocardiogram.  She was  seen and evaluated by Dr. Gala Romney.  Both groins were prepped.  I was  doing another case and came into the room promptly.  We utilized the  left groin because the femoral pulses were somewhat better in the left  groin.  A 6-French sheath was then placed without difficulty.  The  aortic root appeared to be large, and a left Judkins 4 catheter curled  up in the aorta on several occasions.  We then used an L5, and were able  to cannulate the left coronary.  Four views of the left coronary artery  were obtained to lay out the areas of lesions.  Following this,  we used  a JR-4 guiding catheter with side holes to enter the right coronary  which demonstrated a total occlusion.  Aspirin had been given in the  field.  Bivalirudin was then given according to protocol.  We initially  did 2 i-STATs, but the results did not seem appropriate, and finally a  third i-STAT which yielded a normal hemoglobin and a creatinine that was  satisfactory.  We then obtained an ACT which was appropriate for  percutaneous intervention.  A wire was placed down the artery with  prompt reperfusion and then dilatations done with both 2.0- and 2.5-mm  balloons.  We then performed aspiration thrombectomy as there appeared  be a moderately large thrombus burden.  We did get out a small amount of  whitish material compatible with fresh thrombus, but it was not large.  There was not appreciable change in the appearance of the plaque  rupture.  After careful measuring, we felt that a nondrug-eluting stent  would be most appropriate.  Specifically, the vessel was quite large,  and  the patient is not diabetic.  A 24 x 3.5 VeriFLEX nondrug-eluting  platform was then placed across the lesion and dilated to 14  atmospheres.  There was a slight step-up and step-down.  Prior to  placement of the stent, we had given intracoronary nitroglycerin to  maximize vessel size.  Post dilatation was done with a 4-mm balloon with  a 20-mm length x 4.0 Quantum apex which was taken to about 8  atmospheres.  Nitroglycerin was then given as well as some verapamil,  and final angiographic views of the right coronary artery obtained.  Following this, we placed a pigtail catheter in the central aorta and  left ventricle.  Ventriculography was done in the RAO projection.  We  also did an aortic root shot because there was obvious enlargement of  the aortic root.  There were no major complications.  The blood pressure  did improve during the course of the procedure, and came down gradually  with  reperfusion.  ST segments began to resolve but were not completely  resolved by the completion of the procedure.  Oral clopidogrel was  administered at 600 mg, and we kept bivalirudin continued after the  procedure for some time to look complete a 2-hour load.  There were no  major complications in the procedure, and I spoke with the family in  detail afterwards.   HEMODYNAMIC DATA:  1. The initial central aortic pressure was 210/105 with a mean of 149.  2. Left ventricular pressure was 182/17.  3. There did not appear to be a significant gradient on pullback      across the aortic valve.   ANGIOGRAPHIC DATA:  1. The aortic root appeared to be moderately enlarged.  The patient      has poorly controlled and untreated hypertension.  There was at      least 1+ aortic regurgitation noted.  2. Ventriculography in the RAO projection revealed severe hypokinesis      of the inferior myocardial segment.  There did appear to be some      mitral regurgitation, although it was difficult to assess.  It      would be assessed at probably 1-2 plus.  The ejection fraction      would be estimated in the range of about 45-50%.  3. The right coronary artery is the infarct-related artery.  It is      totally occluded near the junction of its proximal and mid segment.      Following wire crossing, there was evidence of a ruptured plaque.      Following stenting, the occlusion was reduced from 100-0% with a      step-up and step-down.  In addition, there was restoration of TIMI      III flow from TIMI 0 flow.  There were collaterals noted left to      right prior to reperfusion.  There was a posterior descending and      posterolateral system and a large posterolateral branch.  The large      posterolateral branch has some diffuse 30-50% irregularities in its      distal most aspect.  There is about 30% in the fourth      posterolateral branch.  The vessel is a large-caliber artery.  4. The left main is  free of critical disease.  5. The LAD has some diffuse narrowing just proximal to the takeoff of      the diagonal.  This could be anywhere  from 40-60% followed by      diffuse 40-50% after the diagonal.  The diagonal itself has      proximal 70% narrowing and then 90% in a subbranch.  The distal LAD      is a moderately large-caliber vessel.  6. The circumflex provides a bifurcating marginal.  There is about a      75% area of focal stenosis prior to the bifurcation.  Some of this      extends into the marginal branch, although it is difficult to      determine how tight that is.   CONCLUSION:  1. Moderate reduction in left ventricular function with mild mitral      regurgitation compatible with acute inferior wall infarction.  2. Aortic root enlargement compatible with untreated hypertension.  3. Total occlusion of the right coronary artery with left-to-right      collaterals and successful reperfusion with first device time of 33      minutes.  4. Scattered moderately severe disease involving the left coronary      system.   DISPOSITION:  1. We will need to get a CT scan to assess the aortic root.  2. A ventriculography will be performed to assess LV function, MR as      well as AI.  3. Aspirin and Plavix will be administered at the current time.  4. I will get opinions from my colleagues regarding treatment of her      other disease.  5. If the CT demonstrates a 6-cm enlargement of the aortic root, then      the patient may need surgery.  We will assess that as we go.      Arturo Morton. Riley Kill, MD, [facility]  Electronically Signed     TDS/MEDQ  D:  07/30/2008  T:  07/31/2008  Job:  [id number]   cc:   Bevelyn Buckles. Bensimhon, MD

## 2010-05-27 NOTE — Consult Note (Signed)
[name], [name]           ACCOUNT NO.:  [id number]   MEDICAL RECORD NO.:  [id number]          PATIENT TYPE:  INP   LOCATION:  2009                         FACILITY:  [facility]   PHYSICIAN:  Salvatore Decent. Dorris Fetch, M.D.DATE OF BIRTH:  January 05, 1928   DATE OF CONSULTATION:  08/02/2008  DATE OF DISCHARGE:                                 CONSULTATION   [name] is a 77 year old woman who presented with a chief complaint  of chest pain.  For several days leading up to admission, she had  morning episodes of chest pain at rest, but they resolves spontaneously.  She has not seen the doctor in many years and no previous history of  cardiac disease.  On the morning of admission, which was July 30, 2008,  between 8-8:30, she developed severe crushing substernal chest pain,  which persisted.  She called EMS.  An EKG showed an inferior ST  elevation.  She was given aspirin and morphine and brought to the  emergency room, then directly to the cardiac catheterization laboratory.  She was pain free there.  She had persistent ST elevation.  She  underwent cardiac catheterization where she was found to have a totally  occluded right coronary.  Dr. Riley Kill performed PTCA and stenting with a  non-drug eluding stent and there was an excellent result with good flow  into the right coronary, which had minimal residual disease.  She was  also noted to have a dilated ascending aorta 1-2+ aortic insufficiency  and 1-2+ mitral insufficiency.  She was markedly hypertensive on  admission.  In addition to her right coronary disease, she was also  noted to have a 70% lesion leading into the bifurcation in her  circumflex.  She had 30% LAD stenosis after the takeoff of a large  branching diagonal, which itself had significant stenosis.  The patient  has been pain free since angioplasty.   Her past medical history significant  for hypertension and presumed  chronic obstructive pulmonary disease and peripheral  arterial disease  with claudication.  She has not seen a doctor in many years prior to  this admission.   She was on no medications prior to admission.   Her current medications are Crestor 40 mg daily, lisinopril 5 mg daily,  aspirin 325 mg daily, Plavix 75 mg daily, Lopressor 12.5 mg b.i.d.,  Pepcid 40 mg p.o. daily.   She has no known drug allergies.   Family history is extensive for coronary artery disease.   SOCIAL HISTORY:  She is retired, widowed.  She smokes.  She really will  not quantify the amount.  She says she had 1 cigarette the day of  admission and threw the rest of the pack away.  She had been trying to  quit for several weeks.  She has been smoking for essentially all of her  adult life.   REVIEW OF SYSTEMS:  The patient was feeling well other than the  prodromal chest pain leading up to her admission.   All other systems negative.   PHYSICAL EXAMINATION:  GENERAL:  [name] is an elderly thin female,  in no  acute distress.  VITAL SIGNS:  Blood pressure is 164/80, pulse 75, respirations are 18,  her oxygen saturation 97% on room air.  NEUROLOGICAL:  She is alert and  oriented x3 with no focal deficits.  HEENT:  Unremarkable except for poor dentition.  She has upper dentures  and she is edentulous.  NECK:  Supple without thyromegaly, adenopathy, bruits.  CARDIAC:  Regular rate and rhythm.  Normal S1 and S2.  There is a faint  systolic murmur.  I did not hear diastolic murmur at this time.  LUNGS:  Distant but equal and clear breath sounds bilaterally.  She no  wheezing.  ABDOMEN:  Soft, nontender.  EXTREMITIES:  Without clubbing, cyanosis, or edema.  She had diminished  dorsalis pedis and posterior tibial pulses bilaterally.   LABORATORY DATA:  Cardiac catheterization is noted.  Echocardiogram  showed some aortic sclerosis.  There was mild aortic insufficiency.  There was a valve area estimated at 1.6 cm2.  The aortic root had  relatively normal  morphology with a dilated aorta beyond the sinotubular  junction.  She also had mild mitral regurgitation.  CT of the chest  shows a 4.9-cm ascending aorta, which tapers to normal size just beyond  the takeoff of the innominate artery.  She also was noted to have  bilateral low-density thyroid lesions.  Her cardiac enzymes had a peak  CK of 3463 with a peak MB of 303, peak troponin was 8.8.  Hemoglobin A1c  was 5.8.  Cholesterol is 227 with HDL of 66 and LDL of 151.  Her sodium  is 130, potassium 4.4, BUN, creatinine 9 and 0.98, glucose 108.  SGOT  was elevated 49, SGPT was slightly elevated at 67, total bilirubin is  normal.  Albumin is 2.8.  White count of 8.0, hematocrit 36, platelets  276.  PT was 13.8 with INR of 1.0, PTT was 28, on admission.   IMPRESSION:  [name] is a 77 year old woman with multiple cardiac  risk factors including hypertension, family history tobacco abuse, as  well as age.  She presented with an acute ST elevation myocardial  infraction and was treated appropriately by Dr. Riley Kill with PTCA and  stenting of the right coronary with an excellent result.  She does have  residual disease in the left coronary system; in addition, she had been  found to have a significant enlargement of her ascending aorta with the  aorta measuring approximately 4.9 cm.  This tapers back to normal size  and the arch just beyond the takeoff of the innominate artery.   My opinion, I agree with Dr. Riley Kill that interval coronary bypass  grafting and resection of the ascending aneurysm will be the appropriate  therapy.  The patient is unsure, if she would wish to proceed with that.  There is a possibility also that the aortic valve may need to be  replaced, but hopefully can be preserved.  I discussed these issues with  the patient, she is uncertain as to whether she want to proceed with  surgery, in any event, she will be managed medically in the interim, to  give her time to recover  from this significant inferior myocardial  infraction and additional time to think over options.  We would consider  surgery, probably a 6-week interval after she has had a chance to  endothelialize her stent and the Plavix could be discontinued.      Salvatore Decent Dorris Fetch, M.D.  Electronically Signed     SCH/MEDQ  D:  08/02/2008  T:  08/03/2008  Job:  [id number]   cc:   Arturo Morton. Riley Kill, MD, [facility]

## 2010-06-06 ENCOUNTER — Encounter: Payer: Self-pay | Admitting: Cardiology

## 2010-06-17 ENCOUNTER — Encounter: Payer: Self-pay | Admitting: Cardiology

## 2010-06-17 ENCOUNTER — Other Ambulatory Visit: Payer: Self-pay | Admitting: Cardiology

## 2010-06-17 DIAGNOSIS — I739 Peripheral vascular disease, unspecified: Secondary | ICD-10-CM

## 2010-06-17 DIAGNOSIS — I6529 Occlusion and stenosis of unspecified carotid artery: Secondary | ICD-10-CM

## 2010-06-18 ENCOUNTER — Encounter: Payer: Self-pay | Admitting: Cardiology

## 2010-06-18 ENCOUNTER — Encounter (INDEPENDENT_AMBULATORY_CARE_PROVIDER_SITE_OTHER): Payer: Medicare Other | Admitting: Cardiology

## 2010-06-18 ENCOUNTER — Ambulatory Visit (INDEPENDENT_AMBULATORY_CARE_PROVIDER_SITE_OTHER): Payer: Medicare Other | Admitting: Cardiology

## 2010-06-18 DIAGNOSIS — I080 Rheumatic disorders of both mitral and aortic valves: Secondary | ICD-10-CM

## 2010-06-18 DIAGNOSIS — E7800 Pure hypercholesterolemia, unspecified: Secondary | ICD-10-CM

## 2010-06-18 DIAGNOSIS — E041 Nontoxic single thyroid nodule: Secondary | ICD-10-CM

## 2010-06-18 DIAGNOSIS — I6529 Occlusion and stenosis of unspecified carotid artery: Secondary | ICD-10-CM

## 2010-06-18 DIAGNOSIS — I739 Peripheral vascular disease, unspecified: Secondary | ICD-10-CM

## 2010-06-18 DIAGNOSIS — I2510 Atherosclerotic heart disease of native coronary artery without angina pectoris: Secondary | ICD-10-CM

## 2010-06-19 ENCOUNTER — Encounter: Payer: Self-pay | Admitting: Cardiology

## 2010-06-21 NOTE — Progress Notes (Signed)
HPI:  She is doing pretty well overall.  She is accompanied by her daughter today.   [facility] but did not want it right away.  As such, she said no.  She saw Dr. Myra Gianotti.  She had more studies today of the carotid, with some progression of disease on the left.  The thyroid was abnormal and she has not followed up with the endocrinologist that we sent her to.  She has abnormal ABI's as well, with reduced digital pressures in the toes.  I encouraged her to follow up with Dr. Myra Gianotti, and follow his lead.   Current Outpatient Prescriptions  Medication Sig Dispense Refill  . amLODipine (NORVASC) 10 MG tablet Take 10 mg by mouth daily.        Marland Kitchen aspirin 81 MG tablet Take 81 mg by mouth daily.        . Coenzyme Q-10 100 MG capsule Take 100 mg by mouth daily.        . furosemide (LASIX) 20 MG tablet Take 1 tablet (20 mg total) by mouth daily.  30 tablet  6  . metoprolol tartrate (LOPRESSOR) 25 MG tablet Take 25 mg by mouth 2 (two) times daily.        . nitroGLYCERIN (NITROSTAT) 0.4 MG SL tablet Place 0.4 mg under the tongue every 5 (five) minutes as needed.        . Omega-3 Fatty Acids (FISH OIL) 1000 MG CAPS Take 2 capsules by mouth daily.        . polyethylene glycol powder (MIRALAX) powder Take 17 g by mouth daily. Take as needed.       . rosuvastatin (CRESTOR) 40 MG tablet Take 40 mg by mouth daily.        . NON FORMULARY Iron injection. Injection every 2 weeks         No Known Allergies  Past Medical History  Diagnosis Date  . CAD (coronary artery disease)     code STEMI with acute inferior posterior ST elevation myocardial infarction. subsequent CABG. Echo 04/2009: EF 45-50%; inf HK; severe LVH; mild AS/AI, mild LAE, PASP 31 mmHg  . Ischemic cardiomyopathy   . Systolic CHF, chronic   . Chronic kidney disease     stage 3  . Hypertension   . Hyperlipidemia   . Hiatal hernia     large  . Tobacco abuse   . Leg pain   . Cerebrovascular disease   .  COPD (chronic obstructive pulmonary disease)     presumed   . Arterial disease     peripheral arterial disease with claudication - bilateral SFA disease with ABI's 0.4-0.5 bilaterally     Past Surgical History  Procedure Date  . Percutaneous coronary intervention   . Laprotomy for bowel obstruction   . Coronary artery bypass graft     aortic thoraic anuerysm resecton 2010.     Family History  Problem Relation Age of Onset  . Coronary artery disease Mother     extensive family history; several other family members.  . Coronary artery disease Brother     History   Social History  . Marital Status: Widowed    Spouse Name: N/A    Number of Children: N/A  . Years of Education: N/A   Occupational History  . retired     Social History Main Topics  . Smoking status: Former Smoker    Quit date: 06/05/2008  . Smokeless tobacco: Not on  file   Comment: She has a history of tobacco use but dos not quantify. She quit in 2010 at the time of her MI   . Alcohol Use: Not on file  . Drug Use: Not on file  . Sexually Active: Not on file   Other Topics Concern  . Not on file   Social History Narrative   She lives in [place]. She is retired and widowed.     ROS: Please see the HPI.  All other systems reviewed and negative.  PHYSICAL EXAM:  BP 144/72  Pulse 94  Ht 5' 8.5" (1.74 m)  Wt 148 lb 12.8 oz (67.495 kg)  BMI 22.30 kg/m2  General: Well developed, well nourished, in no acute distress. Head:  Normocephalic and atraumatic. Neck: no JVD.  She has carotid bruits.   Lungs: Clear to auscultation and percussion. Heart: Normal S1 and S2.  No murmur, rubs or gallops.  Abdomen:  Normal bowel sounds; soft; non tender; no organomegaly Pulses: Pulses normal in all 4 extremities. Extremities: No clubbing or cyanosis. No edema. Neurologic: Alert and oriented x 3.  EKG:  NSR.  Left axis deviation.  LVH with repole changes.  Mild IVCD prob secondary to LVH.   ASSESSMENT  AND PLAN:

## 2010-06-22 NOTE — Assessment & Plan Note (Signed)
Last echo was one year ago.  See above results of study.  Continue to monitor.

## 2010-06-22 NOTE — Assessment & Plan Note (Signed)
Had fine needle aspiration in 2010 with follow up with Dr. Leslie Dales.  She is likely overdue for followup, and cannot remember the name of the MD she saw.  I reminded her of this.

## 2010-06-22 NOTE — Assessment & Plan Note (Signed)
Last LDL was 60.  Given disease, would make sense to continue.  Continue medical therapy.

## 2010-06-22 NOTE — Assessment & Plan Note (Signed)
No recurrent symptoms at present.  Continue medical therapy

## 2010-06-22 NOTE — Assessment & Plan Note (Addendum)
Some progression of disease in the area of LICA.  She should see [facility] in follow up as she may well require both carotid as well as functioning fistula for CKD.

## 2010-06-23 ENCOUNTER — Encounter: Payer: Self-pay | Admitting: Cardiology

## 2010-07-03 ENCOUNTER — Telehealth: Payer: Self-pay | Admitting: Cardiology

## 2010-07-03 NOTE — Telephone Encounter (Signed)
PT RTN CALL TO [facility]

## 2010-07-03 NOTE — Telephone Encounter (Signed)
Patient aware of carotid ultrasound results.  She is aware that we should be calling her with an appointment with Dr. Myra Gianotti soon.

## 2010-07-03 NOTE — Telephone Encounter (Signed)
Message copied by Ellender Hose on Thu Jul 03, 2010  8:17 AM ------      Message from: [place], [place]      Created: Wed Jul 02, 2010  3:37 PM                   ----- Message -----         From: Shawnie Pons, MD         Sent: 06/24/2010   8:08 AM           To: Sharyn Blitz, RN            Please make an appointment for her to see Dr. Myra Gianotti again, and send this result to them.  She was seen recently for an av fistula and declined.

## 2010-07-21 ENCOUNTER — Ambulatory Visit (INDEPENDENT_AMBULATORY_CARE_PROVIDER_SITE_OTHER): Payer: Medicare Other | Admitting: Surgery

## 2010-07-21 DIAGNOSIS — I6529 Occlusion and stenosis of unspecified carotid artery: Secondary | ICD-10-CM

## 2010-07-22 NOTE — Assessment & Plan Note (Signed)
OFFICE VISIT  RUHANI, UMLAND DOB:  [birth year]                                       07/21/2010 [id number]#:[id number]  The patient comes today at the request of Dr. Riley Kill for evaluation of bilateral carotid disease.  She had previously been seen in our office by Dr. Edilia Bo in August of 2011 for similar complaints.  At that time she had bilateral 60%-79% carotid stenosis by velocity criteria.  She was asymptomatic and Dr. Edilia Bo recommended appropriately continuing of medical management.  The patient by velocity profile today has had slight progression but still remains in the 60%-80% category.  She remains asymptomatic.  She denies numbness or weakness.  She denies slurred speech.  She denies amaurosis fugax.  I had recently seen the patient and recommended fistula placement because of her chronic renal failure.  This was scheduled however she cancelled the operation.  She continues to see Dr. Arrie Aran for her renal failure.  She is medically managed for her hypertension, hypercholesterolemia and coronary artery disease.  She had a heart attack in July of 2010 and has symptoms of congestive failure.  REVIEW OF SYSTEMS:  GENERAL:  Positive for loss of appetite. VASCULAR:  Positive for pain in legs with walking. All other review of systems are negative as documented in the encounter form.  PAST MEDICAL HISTORY:  Hypertension, hypercholesterolemia, coronary artery disease, congestive heart failure, chronic renal insufficiency stage IV.  SOCIAL HISTORY:  She is widowed with 3 children.  Does not smoke, quit in July of 2010.  Does not drink.  FAMILY HISTORY:  Negative for premature cardiovascular disease.  PHYSICAL EXAMINATION:  Vital signs:  Heart rate 84, blood pressure 147/74, respiratory rate 12.  General:  She is well-appearing, in no distress.  HEENT:  Within normal limits.  Lungs:  Clear bilaterally.  No wheezes or rhonchi.  Cardiovascular:   Regular rate and rhythm.  I cannot palpate pedal pulses.  No carotid bruits.  Abdomen:  Soft, nontender. Musculoskeletal:  No major deformities.  Neurological:  No focal deficits.  Skin:  Without rash.  DIAGNOSTIC STUDIES:  I reviewed her ultrasound which came from [place]. She has bilateral 60%-79% stenosis and diastolic velocity on the left peaks at 85 and on the right peaks at 44.  ASSESSMENT AND PLAN: 1. Carotid artery occlusive disease:  The patient remains     asymptomatic.  I would recommend continued medical management until     she develops greater than 80% stenosis or has symptoms. 2. Renal disease:  I have again reiterated to the patient that I     believe we need to proceed with fistula placement.  She is going to     discuss this again with Dr. Arrie Aran. 3. Peripheral vascular disease:  I cannot palpate pedal pulses.     However, the patient states that she can tolerate her symptoms now     so I would not proceed with further workup until the patient states     that her symptoms have become more severe.    Jorge Ny, MD Electronically Signed  VWB/MEDQ  D:  07/21/2010  T:  07/22/2010  Job:  [id number]  cc:   Arturo Morton. Riley Kill, MD, [facility] Dr Jeannetta Nap

## 2010-07-29 ENCOUNTER — Emergency Department (HOSPITAL_COMMUNITY): Payer: Medicare Other

## 2010-07-29 ENCOUNTER — Encounter: Payer: Self-pay | Admitting: Internal Medicine

## 2010-07-29 ENCOUNTER — Observation Stay (HOSPITAL_COMMUNITY): Payer: Medicare Other

## 2010-07-29 ENCOUNTER — Encounter (HOSPITAL_COMMUNITY): Payer: Self-pay | Admitting: Radiology

## 2010-07-29 ENCOUNTER — Observation Stay (HOSPITAL_COMMUNITY)
Admission: EM | Admit: 2010-07-29 | Discharge: 2010-07-30 | Disposition: A | Discharge status: Home / Self Care | Payer: Medicare Other | Attending: Internal Medicine | Admitting: Internal Medicine

## 2010-07-29 DIAGNOSIS — I509 Heart failure, unspecified: Secondary | ICD-10-CM | POA: Insufficient documentation

## 2010-07-29 DIAGNOSIS — I5020 Unspecified systolic (congestive) heart failure: Secondary | ICD-10-CM | POA: Insufficient documentation

## 2010-07-29 DIAGNOSIS — Z79899 Other long term (current) drug therapy: Secondary | ICD-10-CM | POA: Insufficient documentation

## 2010-07-29 DIAGNOSIS — D638 Anemia in other chronic diseases classified elsewhere: Secondary | ICD-10-CM | POA: Insufficient documentation

## 2010-07-29 DIAGNOSIS — R0602 Shortness of breath: Secondary | ICD-10-CM | POA: Insufficient documentation

## 2010-07-29 DIAGNOSIS — Q605 Renal hypoplasia, unspecified: Secondary | ICD-10-CM | POA: Insufficient documentation

## 2010-07-29 DIAGNOSIS — Q602 Renal agenesis, unspecified: Secondary | ICD-10-CM | POA: Insufficient documentation

## 2010-07-29 DIAGNOSIS — I517 Cardiomegaly: Secondary | ICD-10-CM | POA: Insufficient documentation

## 2010-07-29 DIAGNOSIS — N183 Chronic kidney disease, stage 3 unspecified: Secondary | ICD-10-CM | POA: Insufficient documentation

## 2010-07-29 DIAGNOSIS — J40 Bronchitis, not specified as acute or chronic: Principal | ICD-10-CM | POA: Insufficient documentation

## 2010-07-29 DIAGNOSIS — I252 Old myocardial infarction: Secondary | ICD-10-CM | POA: Insufficient documentation

## 2010-07-29 DIAGNOSIS — I70219 Atherosclerosis of native arteries of extremities with intermittent claudication, unspecified extremity: Secondary | ICD-10-CM | POA: Insufficient documentation

## 2010-07-29 DIAGNOSIS — I2510 Atherosclerotic heart disease of native coronary artery without angina pectoris: Secondary | ICD-10-CM | POA: Insufficient documentation

## 2010-07-29 DIAGNOSIS — I129 Hypertensive chronic kidney disease with stage 1 through stage 4 chronic kidney disease, or unspecified chronic kidney disease: Secondary | ICD-10-CM | POA: Insufficient documentation

## 2010-07-29 DIAGNOSIS — R042 Hemoptysis: Secondary | ICD-10-CM | POA: Insufficient documentation

## 2010-07-29 DIAGNOSIS — E785 Hyperlipidemia, unspecified: Secondary | ICD-10-CM | POA: Insufficient documentation

## 2010-07-29 LAB — COMPREHENSIVE METABOLIC PANEL
AST: 23 U/L (ref 0–37)
Albumin: 3.4 g/dL — ABNORMAL LOW (ref 3.5–5.2)
Chloride: 104 mEq/L (ref 96–112)
Creatinine, Ser: 1.7 mg/dL — ABNORMAL HIGH (ref 0.50–1.10)
Total Bilirubin: 0.3 mg/dL (ref 0.3–1.2)

## 2010-07-29 LAB — CBC
MCV: 86.9 fL (ref 78.0–100.0)
Platelets: 371 10*3/uL (ref 150–400)
RDW: 14.7 % (ref 11.5–15.5)
WBC: 9.9 10*3/uL (ref 4.0–10.5)

## 2010-07-29 LAB — APTT: aPTT: 32 seconds (ref 24–37)

## 2010-07-29 LAB — POCT I-STAT, CHEM 8
BUN: 45 mg/dL — ABNORMAL HIGH (ref 6–23)
Chloride: 107 mEq/L (ref 96–112)
Sodium: 139 mEq/L (ref 135–145)

## 2010-07-29 LAB — DIFFERENTIAL
Lymphs Abs: 2.7 10*3/uL (ref 0.7–4.0)
Monocytes Relative: 7 % (ref 3–12)
Neutro Abs: 6.4 10*3/uL (ref 1.7–7.7)
Neutrophils Relative %: 65 % (ref 43–77)

## 2010-07-29 LAB — EXPECTORATED SPUTUM ASSESSMENT W GRAM STAIN, RFLX TO RESP C

## 2010-07-29 LAB — PROTIME-INR: INR: 0.95 (ref 0.00–1.49)

## 2010-07-29 MED ORDER — XENON XE 133 GAS
10.0000 | GAS_FOR_INHALATION | Freq: Once | RESPIRATORY_TRACT | Status: AC | PRN
  Administered 2010-07-29: 10 via RESPIRATORY_TRACT

## 2010-07-29 MED ORDER — TECHNETIUM TO 99M ALBUMIN AGGREGATED
6.0000 | Freq: Once | INTRAVENOUS | Status: AC | PRN
  Administered 2010-07-29: 6 via INTRAVENOUS

## 2010-07-29 NOTE — H&P (Signed)
Hospital Admission Note Date: 07/29/2010  Patient name: [name] Medical record number: [id number] Date of birth: [birth year] Age: 77 y.o. Gender: female PCP: Kaleen Mask, MD Cardiology: Dr. Riley Kill, [facility] Attending physician: Dr. Aundria Rud  Place in front of progress note section of chart  First contact: Resident (R1): Dr. Dorise Hiss  Pager: [phone] Second contact: Resident (R3): Dr. Denton Meek  Pager: [phone]  Weekends, holidays, after 5 PM weekdays: First contact: [phone] Second contact: [phone]  Chief Complaint: Coughing up blood  History of Present Illness: The patient is an 77 year old female with past medical history significant for coronary artery disease and STEMI in July of 2010 status post two-vessel CABG with EF of 45-50% who presents to the emergency room with complaints of coughing up blood. The patient states that she awoke at approximately 2 AM on the morning of admission and promptly cough up a small amount of bright red blood and clots. She estimates that she coughed up a total of 2-3 teaspoons of blood; her last episode occurred in the emergency department where it is noted that she coughed up sputum with flecks of blood and clots. She admits to a sensation of wheezing for the past 2 days as well as a sensation of "rattling" in her chest. She admits to increased heartburn and indigestion over the past 2 days but denies abdominal pain, nausea, vomiting, as well as diarrhea.  She is very clear that she is coughing up blood and not experiencing hematemesis. She denies fevers, chills, nonproductive cough, cough productive of nonbloody sputum, shortness of breath, orthopnea, dyspnea on exertion, chest pain, syncope, or other complaints.  She denies any other abnormal bleeding including by red blood per rectum and dark tarry stools.  She has recently come into contact with sick grandchildren; these children are experiencing symptoms of upper respiratory illness.  She  denies dysphagia, odynophagia, and reports good appetite and good by mouth intake over the past few days.  She denies any exposure to tuberculosis and states she was routinely tested well she worked at [employer] and never had a positive PPD.   Home medications: Norvasc 10 mg one tab by mouth daily Aspirin 81 mg one tab by mouth daily Coenzyme Q 10 100 mg one tab by mouth daily Lasix 20 mg tabs 1 tab by mouth daily Lopressor 25 mg tabs 1 tab by mouth twice a day NTG 0.4 mg sublingual 1 tab every 5 minutes when necessary chest pain Iron injection one injection every 2 weeks Fish oil 1000 mg tabs 1 tab by mouth twice a day MiraLax 17 g by mouth daily Crestor 40 mg tabs 1 tab by mouth daily Melatonin: OTC 3 tablets by mouth each bedtime  Allergies: Review of patient's allergies indicates no known allergies.  Past Medical History  Diagnosis Date  . CAD (coronary artery disease)     code STEMI with acute inferior posterior ST elevation myocardial infarction. subsequent CABG. Echo 04/2009: EF 45-50%; inf HK; severe LVH; mild AS/AI, mild LAE, PASP 31 mmHg  . Ischemic cardiomyopathy   . Systolic CHF, chronic   . Chronic kidney disease     stage 3  . Hypertension   . Hyperlipidemia   . Hiatal hernia     large  . Tobacco abuse   . Leg pain   . Cerebrovascular disease   . COPD (chronic obstructive pulmonary disease)     presumed   . Arterial disease     peripheral arterial disease with claudication - bilateral SFA  disease with ABI's 0.4-0.5 bilaterally    anemia of chronic disease,    - baseline hemoglobin of 10   - Patient receives iron injections every 2 months, this is prescribed by nephrology Congenital unilateral kidney    - Discovered incidentally during her hospital stay for her MI History of small bowel obstruction in 2010  Past Surgical History  Procedure Date  . Percutaneous coronary intervention   . Laprotomy for bowel obstruction   . Coronary artery bypass graft     aortic  thoraic anuerysm resecton 2010.     Family History  Problem Relation Age of Onset  . Coronary artery disease Mother     extensive family history; several other family members.  . Coronary artery disease Brother    Social history: The patient lives alone in [place]. Her daughter and son all living nearby and are involved in her care.  She is widowed. She is retired.  She previously worked at [employer] in [employer] and has held for other jobs since retiring from [employer].  She has [place] health care and [employer] for insurance.  She formerly smoked one pack per day for over 30 years; she quit in July 2010.  She does not drink and denies illicit drug use.     Review of Systems: Pertinent items are noted in HPI.  Physical Exam: Vital signs: T.: 98.3, HR: 90, BP 136/68, RR: 20, O2 sat: 100% on RA VItal signs reviewed and stable. GEN: No apparent distress.  Alert and oriented x 3.  Pleasant, conversant, and cooperative to exam. HEENT: head is autraumatic and normocephalic.  Neck is supple without palpable masses or lymphadenopathy.  No JVD; bilateral carotid bruits noted.  Vision intact.  EOMI.  PERRLA.  Sclerae anicteric.  Conjunctivae without pallor or injection. Mucous membranes are moist.  Oropharynx is without erythema, exudates, or other abnormal lesions. Upper dentures are in place. There is no evidence of blood in the oropharynx. RESP:  Lungs are clear to ascultation bilaterally with good air movement.  No wheezes, ronchi, or rubs. CARDIOVASCULAR: regular rate, normal rhythm.  Clear S1, S2, 3/6 systolic murmur radiating to bilateral carotids. No gallop or rub. ABDOMEN: soft, non-tender, non-distended.  Bowels sounds present in all quadrants and normoactive.  No palpable masses. EXT: warm and dry. Extensive varicose veins present on bilateral lower extremities. Peripheral pulses equal, intact, and +1 globally.  No clubbing or cyanosis.  No edema in bilateral lower  extremities. SKIN: warm and dry with normal turgor.  No rashes or abnormal lesions observed. NEURO: CN II-XII grossly intact.  Muscle strength +5/5 in bilateral upper and lower extremities.  Sensation is grossly intact.  No focal deficit.   Lab results:  TCO2                                     25                0-100            mmol/L  Ionized Calcium                          1.13              1.12-1.32        mmol/L  Hemoglobin (HGB)  11.6       l      12.0-15.0        g/dL  Hematocrit (HCT)                         34.0       l      36.0-46.0        %  Sodium (NA)                              139               135-145          mEq/L  Potassium (K)                            3.5               3.5-5.1          mEq/L  Chloride                                 107               96-112           mEq/L  Glucose                                  149        h      70-99            mg/dL  BUN                                      45         h      6-23             mg/dL  Creatinine                               2.10       h      0.50-1.10        Mg/dL   WBC                                      9.9               4.0-10.5         K/uL  RBC                                      3.88              3.87-5.11        MIL/uL  Hemoglobin (HGB)                         11.2       l      12.0-15.0  g/dL  Hematocrit (HCT)                         33.7       l      36.0-46.0        %  MCV                                      86.9              78.0-100.0       fL  MCH -                                    28.9              26.0-34.0        pg  MCHC                                     33.2              30.0-36.0        g/dL  RDW                                      14.7              11.5-15.5        %  Platelet Count (PLT)                     371               150-400          K/uL  Neutrophils, %                           65                43-77            %  Lymphocytes, %                            27                12-46            %  Monocytes, %                             7                 3-12             %  Eosinophils, %                           1                 0-5              %  Basophils, %  0                 0-1              %  Neutrophils, Absolute                    6.4               1.7-7.7          K/uL  Lymphocytes, Absolute                    2.7               0.7-4.0          K/uL  Monocytes, Absolute                      0.7               0.1-1.0          K/uL  Eosinophils, Absolute                    0.1               0.0-0.7          K/uL  Basophils, Absolute                      0.0               0.0-0.1          K/uL  Imaging results:   CXR, PA and Lateral: IMPRESSION: No acute cardiopulmonary abnormality.  Stable large hiatal hernia and chronic lung findings.  V/Q scan:  IMPRESSION: 1.  Low probability for pulmonary embolus.  2.  Air trapping in the right lower lobe and decreased perfusion to the right lower lobe is like related to patient's large hiatal hernia seen on plain film.   Assessment & Plan by Problem:  Hemoptysis The patient is currently hemodynamically stable with a hemoglobin at baseline. She has not experienced any further episodes of hemoptysis after the initial event in the emergency department.  Her revised Geneva score places her in intermediate probability for PE; V/Q scan obtained in the emergency room indicates low probability.  I do not feel initiating anticoagulation for treatment of PE is indicated at this time.  Acute bronchitis is the most likely etiology for hemoptysis however patient denies productive cough or other symptoms of bronchitis; however she admits to recent exposure to grandchildren with upper respiratory illnesses and it is certainly possible she may be experiencing early symptoms of bronchitis.  Given her smoking history I am more concerned that malignancy may be the cause of her  symptoms.  She may be a candidate for bronchoscopy; we'll discuss this after first obtaining a noncontrasted CT study for further evaluation of her lung parenchyma.  It also seems very unlikely that her hemoptysis is the result of TB given lack of exposure and no consistent findings on chest x-ray; our normal chest x-ray also makes fungal pulmonary infections as well as malignancy much less likely.   AVM is also a possibility however further workup of this is limited secondary to patient's chronic kidney insufficiency. - Admit to telemetry - Check coags; coagulopathy can cause hemoptysis - Urine drug screen to evaluate for the possibility of cocaine induced pulmonary hemorrhage, although this seems much less likely - Monitor Hgb  with CBC - Send sputum for Gram stain and culture - CT scan without contrast of chest to evaluate for possible malignancy - Consider bronchoscopy if CT results are unrevealing - Will initiate empiric antibiotic therapy with doxycycline or Avelox if patient develops fever, productive cough, or other symptoms suggestive of acute bronchitis  Hypertension - Continue home medications  Coronary artery disease There is no evidence of current ischemia on 12-lead EKG and the patient is asymptomatic. - Continue her home beta blocker, statin, and aspirin  Chronic kidney insufficiency, stage III - Continue to monitor closely  Anemia of chronic disease - Patient's hemoglobin is currently at her baseline - PH monitor as outlined in problem #1   DVT prophy: SCDs     ___________________ Nelda Bucks, DO (PGY-3)    ___________________ Genella Mech, MD (PGY-1)

## 2010-07-30 DIAGNOSIS — R042 Hemoptysis: Secondary | ICD-10-CM

## 2010-07-30 DIAGNOSIS — R911 Solitary pulmonary nodule: Secondary | ICD-10-CM

## 2010-07-30 DIAGNOSIS — J479 Bronchiectasis, uncomplicated: Secondary | ICD-10-CM

## 2010-07-30 LAB — URINE DRUGS OF ABUSE SCREEN W ALC, ROUTINE (REF LAB)
Amphetamine Screen, Ur: NEGATIVE
Benzodiazepines.: NEGATIVE
Cocaine Metabolites: NEGATIVE
Opiate Screen, Urine: NEGATIVE
Phencyclidine (PCP): NEGATIVE
Propoxyphene: NEGATIVE

## 2010-07-30 LAB — CBC
MCH: 29.3 pg (ref 26.0–34.0)
MCV: 87.5 fL (ref 78.0–100.0)
Platelets: 350 10*3/uL (ref 150–400)
RDW: 15 % (ref 11.5–15.5)

## 2010-07-30 LAB — BASIC METABOLIC PANEL
Calcium: 9.4 mg/dL (ref 8.4–10.5)
Creatinine, Ser: 1.65 mg/dL — ABNORMAL HIGH (ref 0.50–1.10)
GFR calc Af Amer: 36 mL/min — ABNORMAL LOW (ref >60)

## 2010-07-30 NOTE — Discharge Summary (Signed)
Ms. Burtt was admitted with hemoptysis of 1 day duration (1-3 teaspoons at a time). CT revealed inflammation of right upper lobe and lingula, as well as very large hiatal hernia. She was started on doxycycline 100 mg BID x 8 days for bronchitis. She was seen by Dr. Vassie Loll during hospitalization and will f/u with him in clinic on 8/10 at 10:30 am for repeat CXR. There was a very small lesion on CT chest that Dr. Vassie Loll will continue to watch, with possible repeat CT chest in 3 months. Pt was also started on PPI.

## 2010-08-01 ENCOUNTER — Telehealth: Payer: Self-pay | Admitting: Cardiology

## 2010-08-01 LAB — CULTURE, RESPIRATORY W GRAM STAIN

## 2010-08-01 NOTE — Telephone Encounter (Signed)
rtn your call from this am/lg

## 2010-08-01 NOTE — Telephone Encounter (Signed)
Patient aware of what her ABI results showed. She is aware that Dr. Riley Kill needs to review this test.

## 2010-08-06 NOTE — Consult Note (Signed)
[name], [name]           ACCOUNT NO.:  [id number]  MEDICAL RECORD NO.:  [id number]  LOCATION:  4742                         FACILITY:  [facility]  PHYSICIAN:  Oretha Milch, MD      DATE OF BIRTH:  September 06, 1927  DATE OF CONSULTATION:  07/30/2010 DATE OF DISCHARGE:  07/30/2010                                CONSULTATION   INDICATION FOR CONSULTATION:  Hemoptysis.  REFERRING PHYSICIAN:  C. Ulyess Mort, MD  PRIMARY CARE PHYSICIAN:  Windle Guard, MD  CARDIOLOGIST:  Arturo Morton. Riley Kill, MD, [facility]  HISTORY OF PRESENT ILLNESS:  [name] is an 77 year old Caucasian woman who presented to the emergency room on July 29, 2010, early a.m. with coughing up blood.  She woke up at 2 a.m. on the morning of admission with coughing up 2-3 teaspoons of blood.  She denies vomiting or nausea prior to this.  There is no history of preceding URI or chest cold, fevers or chills prior to this or sick contacts.  She does admit to increased heartburn and indigestion over the past 2 days.  Chest x- ray showed some hyperinflation and focal scarring on the lateral view. CT of chest showed scattered opacities in the anterior aspect of the right upper lobe, right middle lobe, and lingula.  A small nodule was also noted in the right lower lobe.  A large hiatal hernia was identified at the entire stomach and the chest.  A V/Q scan was noted to be low probability.  We are consulted to guide further management.  She reports a 30-pack-year history of smoking before quitting in 2010 when she had her CABG.  Her last episode of hemoptysis was yesterday evening when she brought up 1 teaspoon of blood.  It seems to have subsided today.  Again, she denies dyspnea, chest pain, or palpitation.  PAST MEDICAL HISTORY: 1. Coronary artery disease, status post CABG in July 2010 by Dr.     Dorris Fetch, EF of 45-50%. 2. Ischemic cardiomyopathy with history of pulmonary edema and pleural     effusions requiring  thoracentesis. 3. Chronic kidney disease stage III (Dr. Arrie Aran) with congenital     unilateral kidney. 4. Hypertension. 5. Hyperlipidemia. 6. Large hiatal hernia. 7. Cerebrovascular disease. 8. Presumed COPD. 9. Peripheral arterial disease. 10.Anemia of chronic disease.  PAST SURGICAL HISTORY:  Small bowel obstruction in February 2010, status post laparotomy, CABG, and repair of ascending aortic aneurysm in 2010.  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  She is widowed.  Her husband died of throat cancer.  She lives alone in [place], but daughter lives close by.  She smoked about a pack per day for over 30 years.  She quit about 6 years in between and then restarted.  She finally quit in July 2010.  No history of alcohol or illicit drug use.  HOME MEDICATIONS: 1. Norvasc 10 mg p.o. once a day. 2. Aspirin 81 mg daily. 3. Lasix 20 mg p.o. daily. 4. Lopressor 25 mg p.o. b.i.d. 5. Nitroglycerin sublingual as a needed. 6. Fish oil 1000 mg b.i.d. 7. MiraLax 17 g daily. 8. Crestor 40  mg daily. 9. Melatonin 3 tablets at bedtime. 10.Coenzyme Q.  REVIEW OF SYSTEMS:  As described above in the HPI.  No history of epistaxis, hematemesis, or melena.  No history of nausea, vomiting, chest pain, palpitation, dizziness, or pedal edema.  No history of similar complaints in the past.  Other detailed review of systems was negative.  PHYSICAL EXAMINATION:  GENERAL:  Elder woman sitting up in bed, in no apparent respiratory distress, well nourished, afebrile last 24 hours. VITAL SIGNS:  Heart rate 65, respirations 16, blood pressure 115/73, and oxygen saturation 93% on room air. HEENT:  No oropharyngeal source of bleeding or epistaxis. NECK:  Supple.  No JVD.  No lymphadenopathy. CVS:  S1 and S2 normal. CHEST:  Clear to auscultation. ABDOMEN:  Soft and nontender. NEUROLOGICAL:  Nonfocal. EXTREMITIES:  No  edema.  LABORATORY DATA:  Respiratory culture, gram-negative rods. BUN/creatinine 32/1.6, potassium 4.2, and calcium 9.4.  WBC count 6.9, hemoglobin 11, and platelets 350.  Urine toxicology negative.  Stool occult blood positive.  IMAGING STUDIES:  As described above.  IMPRESSION: 1. Hemoptysis, presumably from bronchiectasis.  No focal lesions     identified on CT scan except for a subcentimeter right lower lobe     nodule. 2. Ischemic cardiomyopathy. 3. Large hiatal hernia.  RECOMMENDATIONS:  Since bleeding seems to have stopped and no focal lesions are noted on the CT scan, I would defer bronchoscopy for now.  I agree with treating her with doxycycline for 7 days for empiric bronchitis and treating empirically with antitussive such as Delsym for cough.  The absence of prodrome from for bronchitis and her history of smoking is bothersome and I would have a low threshold to proceed with a bronchoscopy should she rebleed.  A followup CT scan can be obtained in 3-6 months' time for the right lower lobe nodule given the history of smoking.  We will make arrangements for this as an outpatient.     Oretha Milch, MD     RVA/MEDQ  D:  07/30/2010  T:  07/30/2010  Job:  [id number]  cc:   Windle Guard, M.D.  Electronically Signed by Cyril Mourning MD on 08/06/2010 09:49:04 PM

## 2010-08-07 ENCOUNTER — Other Ambulatory Visit: Payer: Self-pay | Admitting: Cardiology

## 2010-08-14 NOTE — Discharge Summary (Signed)
NAMEJACQUALYN, [name] NO.:  [id number]  MEDICAL RECORD NO.:  [id number]  LOCATION:  4742                         FACILITY:  [facility]  PHYSICIAN:  Blanch Media, M.D.DATE OF BIRTH:  Jan 17, 1927  DATE OF ADMISSION:  07/29/2010 DATE OF DISCHARGE:  07/30/2010                              DISCHARGE SUMMARY   DISCHARGE DIAGNOSIS:  Bronchitis, resulting in hemoptysis of 1-day duration.  SECONDARY DIAGNOSES: 1. Systolic congestive heart failure, last echo in April 2011,     ejection fraction 45-50%. 2. Chronic kidney disease, stage III, baseline creatinine 1.6,     hemodialysis recommended by Dr. Arrie Aran. 3. Coronary artery disease, status post ST-elevation myocardial     infarction and subsequent 2-vessel coronary artery bypass grafting     in July 2010, ascending aortic aneurysm also resected in July 2010. 4. Hypertension. 5. Left ventricular hypertrophy, severe. 6. Hyperlipidemia. 7. Cerebrovascular disease, 60-79% stenosis by cardiac Doppler,     asymptomatic, medically managed by Dr. Riley Kill. 8. Hiatal hernia, entire stomach above diaphragm. 9. Tobacco abuse, quit in July 2010. 10.Peripheral arterial disease with claudication, ABIs 0.4-0.5     bilaterally. 11.Anemia of chronic disease, baseline hemoglobin of 10, receives iron     injection every 2 months, prescribed by Nephrology. 12.Congenital unilateral kidney, discovered incidentally in July 2010.  ALLERGIES:  No known drug allergies.  DISCHARGE MEDICATIONS: 1. Doxycycline 100 mg by mouth twice daily. 2. Pantoprazole 40 mg by mouth daily. 3. Amlodipine 10 mg by mouth daily. 4. Aspirin 81 mg by mouth daily. 5. Co-Q 10 one tablet by mouth daily. 6. Crestor 40 mg 1 tablet by mouth daily at bedtime. 7. Fish oil 1000 mg 2 caplets by mouth daily. 8. Furosemide 1 tablet by mouth daily. 9. Melatonin 5 mg 3 tablets by mouth daily at bedtime as needed. 10.Metoprolol 25 mg 1 tablet by mouth twice  daily. 11.MiraLax 17 grams by mouth daily as needed for constipation. 12.Nitroglycerin sublingual 0.4 mg 1 tablet under tongue every 5     minutes as needed up to 3 doses as needed.  DISPOSITION AND FOLLOWUP:  The patient was discharged home in improved condition.  The patient should follow up with: 1. Dr. Jeannetta Nap, her PCP, for hospital followup.  The patient will     schedule this appointment. 2. Dr. Vassie Loll of [facility], on August 22, 2010, at 10:30 a.m.     for followup of bronchitis and bronchiectasis.  Dr. Vassie Loll will     likely due repeat chest x-ray at that time. 3. The patient is on follow up with Dr. Riley Kill of cardiology and Dr.     Arrie Aran of Nephrology for medical management for other chronic     diseases.  PROCEDURES:  None.  RADIOLOGY: 1. Chest x-ray, no acute cardiopulmonary abnormality.  Stable large     hiatal hernia and chronic lung findings. 2. V/Q scan, low probability. 3. CT chest, new area of peribronchial thickening, focal     bronchiectasis, and peribronchial inflammation in the right upper     lobe adjacent to the minor fissure.  Small focal area of new     inflammation in the lateral aspect of the lingula.  The entire  stomach is now herniated into the chest.  CONSULTATIONS:  Dr. Vassie Loll of Pulmonology for evaluation of hemoptysis.  CHIEF COMPLAINT:  Coughing up blood.  HISTORY OF PRESENT ILLNESS:  The patient is an 77 year old female past medical history significant for coronary artery disease and STEMI in July 2010, status post 2-vessel CABG with ejection fraction of 45-50%, who presents to the emergency room complains of coughing up blood.  The patient states that she awoke at approximately 2:00 a.m. on the morning of admission and promptly coughed up a small amount of bright red blood and clots.  She estimates that she coughed up a total of 2-3 teaspoons of blood.  Her last episode occurred in the emergency where it was noted that she  coughed up sputum with flecks of blood and clot.  She admits a sensation of wheezing for the past few days as well as a sensation of rattling in her chest.  She admits to increased heartburn and indigestion over the past 2 days, but denies abdominal pain, nausea, vomiting as well as diarrhea.  She is very clear that she is coughing up blood and not experiencing hematemesis.  She denies fevers, chills, nonproductive cough, cough productive of bloody sputum, shortness of breath, orthopnea, dyspnea on exertion, chest pain, syncope, or other complaints.  She denies any other abnormal bleeding including bright red blood per rectum or dark tarry stools.  She has recently come into contact with her grandchildren who are experiencing symptoms of upper respiratory illness.  She denies dysphagia, odynophagia, and reports good appetite and good by-mouth intake over the past few days.  She denies any exposure to tuberculosis and states that she was routinely tested while she worked at [employer] and never had a positive PPD.  HOME MEDICATIONS: 1. Norvasc 10 mg 1 tablet by mouth daily. 2. Aspirin 81 mg 1 tablet by mouth daily. 3. Coenzyme-Q 10, 100 mg by mouth daily. 4. Lasix 20 mg by mouth daily. 5. Lopressor 25 mg 1 tablet by mouth daily. 6. Nitroglycerin 0.4 mg sublingual 1 tablet every 5 minutes as     necessary for chest pain. 7. Iron injection, 1 injection every 2 months. 8. Fish oil 1000 mg tablets, 1 tablet by mouth twice a day. 9. MiraLax 17 grams by mouth daily. 10.Crestor 40 mg tablet by mouth daily. 11.Melatonin 3 tablets by mouth at bedtime.  ALLERGIES:  The patient has no known drug allergies.  PAST MEDICAL HISTORY: 1. Coronary artery disease with STEMI in July 2010 and subsequent     CABG.  Echo in April 2011 showed ejection fraction of 45-50%,     severe left ventricular hypertrophy, mild aortic stenosis/aortic     insufficiency, mild left atrial enlargement. 2. Ischemic  cardiomyopathy. 3. Systolic congestive heart failure, chronic. 4. Chronic kidney disease stage III, followed by Dr. Arrie Aran. 5. Hypertension. 6. Hyperlipidemia. 7. Hiatal hernia. 8. Tobacco abuse. 9. Leg pain. 10.Cerebrovascular disease. 11.COPD. 12.Arterial disease with claudication with ABI 0.4-0.5 bilaterally. 13.Anemia of chronic disease with baseline hemoglobin of 10. 14.Congenital unilateral kidney, discovered incidentally during her     hospital stay for her MI in July 2010. 15.History of small bowel obstruction in 2010. 16.History of follicular thyroid lesion. 17.History of abdominal aortic aneurysm, repaired in July 2010.  PAST SURGICAL HISTORY: 1. Percutaneous coronary intervention. 2. Laparotomy for bowel obstruction. 3. Coronary artery bypass graft with aortic aneurysm resection in July     2010.  PHYSICAL EXAMINATION:  VITAL SIGNS:  Temperature 98.3, heart rate  90, blood pressure 136/68, respiratory rate 20, oxygen saturation 100% on room air. GENERAL:  The patient is in no apparent distress.  Alert and oriented x3.  Pleasant, conversant, and cooperative to exam. HEENT:  Head is atraumatic and normocephalic.  Neck is supple with no palpable masses or lymphadenopathy.  No JVD.  Bilateral carotid bruits are noted.  Vision intact, extraocular motions intact.  Pupils equal, round, reactive to light.  Sclerae anicteric.  Conjunctivae without pallor or injection.  Mucous membranes are moist.  Oropharynx is without erythema, exudates or other abnormal lesions.  Upper dentures are in place.  There is no evidence of blood in the oropharynx. RESPIRATORY:  Lungs were clear to auscultation bilaterally with good air movement.  No wheezes, rhonchi, or rubs. CARDIOVASCULAR:  Regular rate, normal rhythm.  Normal S1, S2, 3/6 systolic murmur radiating to bilateral carotids.  No gallops or rubs. ABDOMEN:  Soft, nontender, nondistended.  Bowel sounds present and normoactive.  No  palpable masses. EXTREMITIES:  Warm and dry.  Extensive varicose veins present on bilateral lower extremities.  Peripheral pulses equal intact and 1+ globally.  No clubbing or cyanosis.  No edema in bilateral lower extremities. SKIN:  Warm and dry with normal turgor.  No rashes or abnormal lesions observed. NEURO:  Cranial nerves II-XII grossly intact.  Muscle strength 5/5 in bilateral upper and lower extremities.  Sensation is grossly intact.  No focal deficits.  ADMITTING LAB RESULTS:  WBC 9.9, hemoglobin 11.2, hematocrit 33.7, platelet count 371.  Sodium 139, potassium 3.7, chloride 104, bicarb 24, BUN 38, creatinine 0.7, glucose 130, total bilirubin 0.3, alkaline phosphatase 65, AST 23, ALT 12, total protein 6.7, albumin 3.4, calcium 9.6.  HOSPITAL COURSE:  [name] is an 77 year old woman with past medical history significant for coronary artery disease and STEMI in July 2010, status post 2-vessel CABG with EF of 45-50%, chronic kidney disease, and former tobacco abuse, who presented with hemoptysis.  She was admitted to telemetry for evaluation of hemoptysis.  The following is a summary of her hospital course by problem. 1. Hemoptysis.  The patient had hemoptysis of 1-day duration.  Most     likely cause of hemoptysis is bronchitis, but given the patient's     smoking history, malignancy had to be ruled out.  The patient     underwent CT of chest which showed inflammation and bronchiectasis.     Coagulation studies were normal.  Hemoglobin remained stable.     Hemoptysis resolved within 24 hours.  Pulmonology was consulted and     did not feel that the patient needed bronchoscopy.  Although the     patient did not exhibit other signs of bronchitis, she was started     empirically on doxycycline for possible bronchitis and sent home     with an 8-day course.  The patient should follow up with Dr. Vassie Loll     on August 22, 2010, for repeat chest x-ray or sooner if hemoptysis      returns. 2. Hypertension.  The patient was given her home regimen of Norvasc     and Lopressor, and was normotensive during her hospital stay. 3. Coronary artery disease.  The patient was given her home regimen of     aspirin 81 mg daily and Crestor. 4. Cerebrovascular disease.  The patient indicated that she has not     had recent carotid Dopplers and was advised to follow up with Dr.     Riley Kill as an outpatient.  Dr. Rosalyn Charters most recent note indicates     that the patient was seen on July 21, 2010, and remains in the 60-     80% category of carotid stenosis and remained asymptomatic.  The     patient should follow up with Dr. Riley Kill if she has continued     concerns.  DISCHARGE VITALS:  Temperature 97.9, pulse 65, respirations 16, blood pressure 115/73, oxygen saturation 93% on room air.  DISCHARGE LABS:  Sputum Gram smear revealed a few white blood cells present, predominantly PMN, rare squamous epithelial cells present, moderate gram-positive cocci in pairs and chains, few gram-positive rods.  CBC:  WBC 6.9, hemoglobin 11.0, hematocrit 32.9, platelets 350, MCV 87.5.  Sodium 142, potassium 4.2, chloride 105, bicarb 30, BUN 32, creatinine 0.65, glucose 92, calcium 9.4.  Sputum culture pending.     Deatra Robinson, MD   ______________________________ Blanch Media, M.D.    NK/MEDQ  D:  07/30/2010  T:  07/31/2010  Job:  [id number]  cc:   Oretha Milch, MD Windle Guard, M.D. Terrial Rhodes, M.D. Arturo Morton. Riley Kill, MD, [facility]  Electronically Signed by Deatra Robinson  on 08/04/2010 08:36:25 AM Electronically Signed by Blanch Media M.D. on 08/14/2010 09:25:37 AM

## 2010-08-22 ENCOUNTER — Inpatient Hospital Stay: Payer: Medicare Other | Admitting: Pulmonary Disease

## 2010-09-02 ENCOUNTER — Encounter: Payer: Self-pay | Admitting: Cardiology

## 2010-10-10 IMAGING — CR DG CHEST 1V PORT
1 series · 1 of 1 positions shown · non-contrast
Comparison: 09/24/2008

CLINICAL DATA: Postop day 0 status post CABG

PORTABLE CHEST - 1 VIEW

[view not recorded]
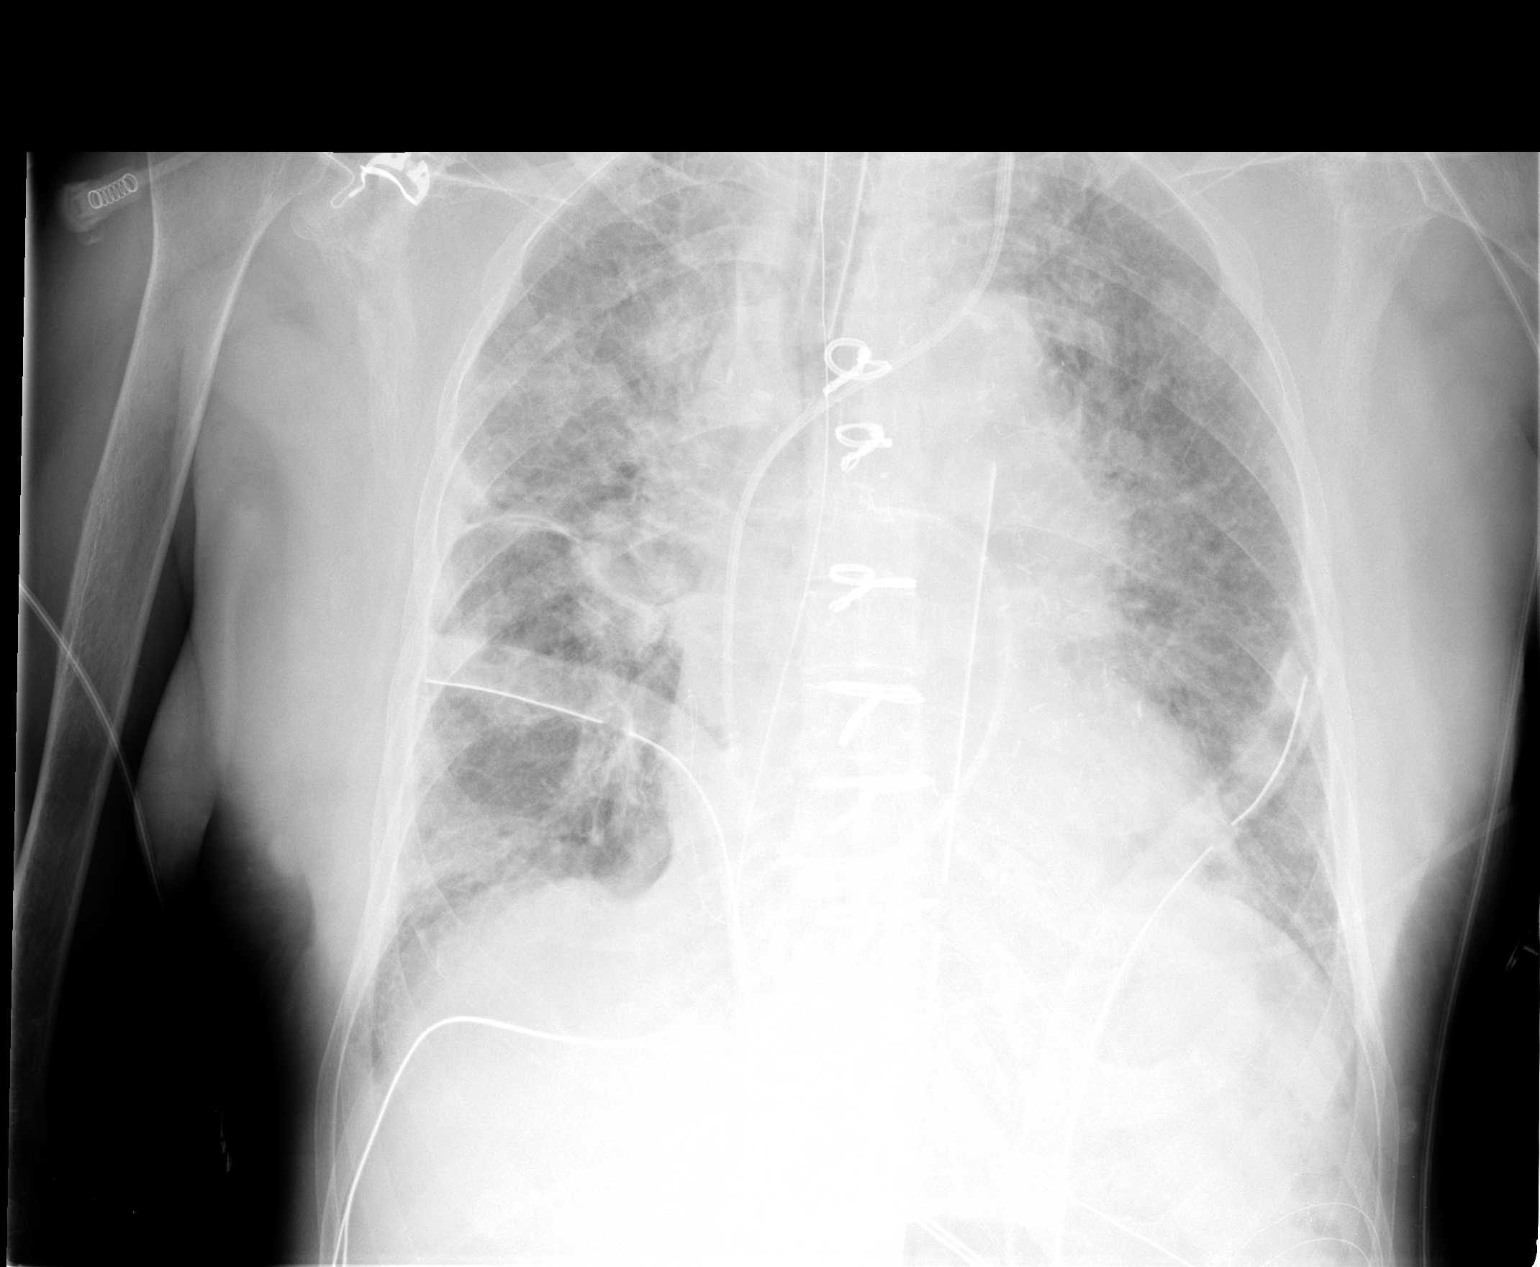

[1 of 1 positions shown; findings below may reference images not displayed]

FINDINGS: Interval CABG noted.  Endotracheal tube tip is 3.2 cm
above the carina.  Nasogastric tube is in place.

Left internal jugular Gita Sakhe catheters present with tip in the
right pulmonary artery.

Bilateral chest tubes and mediastinal drain noted.

No discrete pneumothorax is identified.

Bilateral interstitial and patchy airspace opacities are compatible
with pulmonary edema.  There is likely some retrocardiac
atelectasis in the left lower lobe.
IMPRESSION: 1.  Status post CABG, with tubes and lines satisfactorily
positioned, with no pneumothorax apparent.
2.  Interstitial and patchy airspace opacities are present
compatible with pulmonary edema.
3.  Suspected left lower lobe atelectasis.

## 2010-11-03 ENCOUNTER — Encounter: Payer: Self-pay | Admitting: Cardiology

## 2010-12-18 ENCOUNTER — Encounter: Payer: Self-pay | Admitting: Cardiology

## 2010-12-18 ENCOUNTER — Ambulatory Visit (INDEPENDENT_AMBULATORY_CARE_PROVIDER_SITE_OTHER): Payer: Medicare Other | Admitting: Cardiology

## 2010-12-18 DIAGNOSIS — I359 Nonrheumatic aortic valve disorder, unspecified: Secondary | ICD-10-CM

## 2010-12-18 DIAGNOSIS — I10 Essential (primary) hypertension: Secondary | ICD-10-CM

## 2010-12-18 DIAGNOSIS — J40 Bronchitis, not specified as acute or chronic: Secondary | ICD-10-CM

## 2010-12-18 DIAGNOSIS — I2510 Atherosclerotic heart disease of native coronary artery without angina pectoris: Secondary | ICD-10-CM

## 2010-12-18 DIAGNOSIS — E7800 Pure hypercholesterolemia, unspecified: Secondary | ICD-10-CM

## 2010-12-18 LAB — HEPATIC FUNCTION PANEL
ALT: 17 U/L (ref 0–35)
Albumin: 4.2 g/dL (ref 3.5–5.2)
Total Bilirubin: 0.8 mg/dL (ref 0.3–1.2)

## 2010-12-18 LAB — LIPID PANEL
HDL: 62.5 mg/dL (ref >39.00)
Total CHOL/HDL Ratio: 3
Triglycerides: 130 mg/dL (ref 0.0–149.0)
VLDL: 26 mg/dL (ref 0.0–40.0)

## 2010-12-18 NOTE — Progress Notes (Signed)
HPI:  She is stable from a cardiac standpoint.  She is doing well.  She is concerned about developing a rash around both ears which she attributes to fish oil capsules.  She developed that after the fact.  She wants to come off.  No chest pain.  She cancelled her appointments with with vascular surgery, thyroid specialist, and pulm.  She had hemoptysis and was supposed to return, but it went away and she decided to cancel.    Current Outpatient Prescriptions  Medication Sig Dispense Refill  . amLODipine (NORVASC) 10 MG tablet Take 10 mg by mouth daily.        Marland Kitchen aspirin 81 MG tablet Take 81 mg by mouth daily.        . calcitRIOL (ROCALTROL) 0.25 MCG capsule Take 0.25 mcg by mouth every other day.        . Coenzyme Q-10 100 MG capsule Take 100 mg by mouth daily.        . CRESTOR 40 MG tablet TAKE ONE TABLET BY MOUTH EVERY DAY  30 each  6  . furosemide (LASIX) 20 MG tablet Take 1 tablet (20 mg total) by mouth daily.  30 tablet  6  . metoprolol tartrate (LOPRESSOR) 25 MG tablet Take 25 mg by mouth 2 (two) times daily.        . nitroGLYCERIN (NITROSTAT) 0.4 MG SL tablet Place 0.4 mg under the tongue every 5 (five) minutes as needed.        . Omega-3 Fatty Acids (FISH OIL) 1000 MG CAPS Take 2 capsules by mouth daily.        . polyethylene glycol powder (MIRALAX) powder Take 17 g by mouth daily. Take as needed.         No Known Allergies  Past Medical History  Diagnosis Date  . CAD (coronary artery disease)     code STEMI with acute inferior posterior ST elevation myocardial infarction. subsequent CABG. Echo 04/2009: EF 45-50%; inf HK; severe LVH; mild AS/AI, mild LAE, PASP 31 mmHg  . Ischemic cardiomyopathy   . Systolic CHF, chronic   . Chronic kidney disease     stage 3  . Hypertension   . Hyperlipidemia   . Hiatal hernia     large  . Tobacco abuse   . Leg pain   . Cerebrovascular disease   . COPD (chronic obstructive pulmonary disease)     presumed   . Arterial disease    peripheral arterial disease with claudication - bilateral SFA disease with ABI's 0.4-0.5 bilaterally   . Renal insufficiency     Past Surgical History  Procedure Date  . Percutaneous coronary intervention   . Laprotomy for bowel obstruction   . Coronary artery bypass graft     aortic thoraic anuerysm resecton 2010.     Family History  Problem Relation Age of Onset  . Coronary artery disease Mother     extensive family history; several other family members.  . Coronary artery disease Brother     History   Social History  . Marital Status: Widowed    Spouse Name: N/A    Number of Children: N/A  . Years of Education: N/A   Occupational History  . retired     Social History Main Topics  . Smoking status: Former Smoker    Quit date: 06/05/2008  . Smokeless tobacco: Not on file   Comment: She has a history of tobacco use but dos not quantify. She quit in 2010  at the time of her MI   . Alcohol Use: Not on file  . Drug Use: Not on file  . Sexually Active: Not on file   Other Topics Concern  . Not on file   Social History Narrative   She lives in [place]. She is retired and widowed.     ROS: Please see the HPI.  All other systems reviewed and negative.  PHYSICAL EXAM:  BP 130/78  Pulse 72  Ht 5\' 8"  (1.727 m)  Wt 67.64 kg (149 lb 1.9 oz)  BMI 22.67 kg/m2  General: Well developed, well nourished, in no acute distress. Head:  Normocephalic and atraumatic. Neck: no JVD Lungs:  Decreased BS with some prolonged expiration. Heart: Normal S1 and S2.  No murmur, rubs or gallops.  Abdomen:  Normal bowel sounds; soft; non tender; no organomegaly Neurologic: Alert and oriented x 3.  EKG:  NSR.  LVH with repole changes.  CT:  (07/2010)  Since the prior study the patient has developed an area of  increased atelectasis and peribronchial inflammation in the  anterior aspect of the right upper lobe immediately adjacent to the  minor fissure with patchy  parenchymal density.  There is also new slight inflammatory disease in the lateral aspect  of the lingula of the left lung.  No acute osseous abnormality. Chronic cardiomegaly. No effusions.  Discrete hilar or mediastinal adenopathy.  IMPRESSION:  1. New area of peribronchial thickening, focal bronchiectasis, and  peribronchial inflammation in the right upper lobe adjacent to the  minor fissure.  2. Small focal area of new inflammation in the lateral aspect of  the lingula.  3. The entire stomach is now herniated into the chest.  Original Report Authenticated By: Gwynn Burly, M.D.   ASSESSMENT AND PLAN:

## 2010-12-18 NOTE — Assessment & Plan Note (Signed)
Time for lipid and liver profile.

## 2010-12-18 NOTE — Patient Instructions (Addendum)
Your physician wants you to follow-up in: 6 MONTHS. You will receive a reminder letter in the mail two months in advance. If you don't receive a letter, please call our office to schedule the follow-up appointment.  Your physician has recommended you make the following change in your medication:  STOP Fish Oil  Your physician recommends that you have a lipid and liver profile today.     Please have a chest x-ray done at the [place] office on [facility].   Your physician has requested that you have an echocardiogram. Echocardiography is a painless test that uses sound waves to create images of your heart. It provides your doctor with information about the size and shape of your heart and how well your heart's chambers and valves are working. This procedure takes approximately one hour. There are no restrictions for this procedure.

## 2010-12-18 NOTE — Assessment & Plan Note (Signed)
No recurrent chest pain.  Doing well cardiac wise.

## 2010-12-18 NOTE — Assessment & Plan Note (Signed)
Controlled.  

## 2010-12-18 NOTE — Assessment & Plan Note (Signed)
Resolved.  Associated with hemoptysis.  She cancelled her appointment.  Will recheck CXR to make sure no developing CA.

## 2010-12-18 NOTE — Assessment & Plan Note (Signed)
Cannot hear, and not clearly symptomatic, but she should have a repeat echocardiogram.

## 2010-12-19 ENCOUNTER — Other Ambulatory Visit: Payer: Self-pay | Admitting: Nephrology

## 2010-12-19 ENCOUNTER — Ambulatory Visit (INDEPENDENT_AMBULATORY_CARE_PROVIDER_SITE_OTHER)
Admission: RE | Admit: 2010-12-19 | Discharge: 2010-12-19 | Disposition: A | Discharge status: Home / Self Care | Payer: Medicare Other | Source: Ambulatory Visit | Attending: Cardiology | Admitting: Cardiology

## 2010-12-19 DIAGNOSIS — I2510 Atherosclerotic heart disease of native coronary artery without angina pectoris: Secondary | ICD-10-CM

## 2010-12-24 ENCOUNTER — Ambulatory Visit (HOSPITAL_COMMUNITY): Payer: Medicare Other | Attending: Internal Medicine | Admitting: Radiology

## 2010-12-24 DIAGNOSIS — I079 Rheumatic tricuspid valve disease, unspecified: Secondary | ICD-10-CM | POA: Insufficient documentation

## 2010-12-24 DIAGNOSIS — I10 Essential (primary) hypertension: Secondary | ICD-10-CM | POA: Insufficient documentation

## 2010-12-24 DIAGNOSIS — I2589 Other forms of chronic ischemic heart disease: Secondary | ICD-10-CM | POA: Insufficient documentation

## 2010-12-24 DIAGNOSIS — I252 Old myocardial infarction: Secondary | ICD-10-CM | POA: Insufficient documentation

## 2010-12-24 DIAGNOSIS — I379 Nonrheumatic pulmonary valve disorder, unspecified: Secondary | ICD-10-CM | POA: Insufficient documentation

## 2010-12-24 DIAGNOSIS — I359 Nonrheumatic aortic valve disorder, unspecified: Secondary | ICD-10-CM | POA: Insufficient documentation

## 2010-12-24 DIAGNOSIS — I059 Rheumatic mitral valve disease, unspecified: Secondary | ICD-10-CM | POA: Insufficient documentation

## 2010-12-29 ENCOUNTER — Other Ambulatory Visit (HOSPITAL_COMMUNITY): Payer: Self-pay | Admitting: *Deleted

## 2010-12-30 ENCOUNTER — Encounter (HOSPITAL_COMMUNITY)
Admission: RE | Admit: 2010-12-30 | Discharge: 2010-12-30 | Disposition: A | Discharge status: Home / Self Care | Payer: Medicare Other | Source: Ambulatory Visit | Attending: Nephrology | Admitting: Nephrology

## 2010-12-30 DIAGNOSIS — D509 Iron deficiency anemia, unspecified: Secondary | ICD-10-CM | POA: Insufficient documentation

## 2010-12-30 MED ORDER — FERUMOXYTOL INJECTION 510 MG/17 ML
INTRAVENOUS | Status: AC
  Administered 2010-12-30: 510 mg via INTRAVENOUS
  Filled 2010-12-30: qty 17

## 2010-12-30 MED ORDER — FERUMOXYTOL INJECTION 510 MG/17 ML
510.0000 mg | INTRAVENOUS | Status: DC
  Administered 2010-12-30: 510 mg via INTRAVENOUS

## 2011-01-07 ENCOUNTER — Encounter (HOSPITAL_COMMUNITY): Payer: Medicare Other

## 2011-01-08 ENCOUNTER — Other Ambulatory Visit: Payer: Self-pay | Admitting: Cardiology

## 2011-01-12 ENCOUNTER — Telehealth: Payer: Self-pay

## 2011-01-12 NOTE — Telephone Encounter (Signed)
Per notes from Dr Riley Kill he would like the pt to have a repeat chest x-ray in January.  The pt will go to the [place] office the week of 02/02/11 to have a chest x-ray repeated.  Pt agreed with plan.

## 2011-01-16 ENCOUNTER — Encounter (HOSPITAL_COMMUNITY)
Admission: RE | Admit: 2011-01-16 | Discharge: 2011-01-16 | Disposition: A | Discharge status: Home / Self Care | Payer: Medicare Other | Source: Ambulatory Visit | Attending: Nephrology | Admitting: Nephrology

## 2011-01-16 DIAGNOSIS — D509 Iron deficiency anemia, unspecified: Secondary | ICD-10-CM | POA: Insufficient documentation

## 2011-01-16 MED ORDER — FERUMOXYTOL INJECTION 510 MG/17 ML
510.0000 mg | INTRAVENOUS | Status: AC
  Administered 2011-01-16: 510 mg via INTRAVENOUS
  Filled 2011-01-16: qty 17

## 2011-02-02 ENCOUNTER — Other Ambulatory Visit: Payer: Self-pay | Admitting: Cardiology

## 2011-02-02 ENCOUNTER — Ambulatory Visit (INDEPENDENT_AMBULATORY_CARE_PROVIDER_SITE_OTHER)
Admission: RE | Admit: 2011-02-02 | Discharge: 2011-02-02 | Disposition: A | Discharge status: Home / Self Care | Payer: Medicare Other | Source: Ambulatory Visit | Attending: Cardiology | Admitting: Cardiology

## 2011-02-02 DIAGNOSIS — R042 Hemoptysis: Secondary | ICD-10-CM

## 2011-03-11 ENCOUNTER — Encounter: Payer: Self-pay | Admitting: *Deleted

## 2011-03-12 ENCOUNTER — Telehealth: Payer: Self-pay

## 2011-03-12 DIAGNOSIS — R9389 Abnormal findings on diagnostic imaging of other specified body structures: Secondary | ICD-10-CM

## 2011-03-12 NOTE — Telephone Encounter (Signed)
Per notes from Dr Riley Kill on pt's chest x-ray. CT scheduled for Thursday 03/19/11 at 11:00.  No prep.  Appointment scheduled with Dr Riley Kill on 04/20/11.

## 2011-03-19 ENCOUNTER — Ambulatory Visit (INDEPENDENT_AMBULATORY_CARE_PROVIDER_SITE_OTHER)
Admission: RE | Admit: 2011-03-19 | Discharge: 2011-03-19 | Disposition: A | Discharge status: Home / Self Care | Payer: Medicare Other | Source: Ambulatory Visit | Attending: Cardiology | Admitting: Cardiology

## 2011-03-19 DIAGNOSIS — R9389 Abnormal findings on diagnostic imaging of other specified body structures: Secondary | ICD-10-CM

## 2011-03-19 DIAGNOSIS — R918 Other nonspecific abnormal finding of lung field: Secondary | ICD-10-CM

## 2011-04-03 ENCOUNTER — Ambulatory Visit (INDEPENDENT_AMBULATORY_CARE_PROVIDER_SITE_OTHER): Payer: Medicare Other | Admitting: Cardiology

## 2011-04-03 ENCOUNTER — Encounter: Payer: Self-pay | Admitting: Cardiology

## 2011-04-03 VITALS — BP 142/78 | HR 78 | Ht 69.0 in | Wt 148.0 lb

## 2011-04-03 DIAGNOSIS — I25810 Atherosclerosis of coronary artery bypass graft(s) without angina pectoris: Secondary | ICD-10-CM

## 2011-04-03 DIAGNOSIS — I6529 Occlusion and stenosis of unspecified carotid artery: Secondary | ICD-10-CM

## 2011-04-03 DIAGNOSIS — E7800 Pure hypercholesterolemia, unspecified: Secondary | ICD-10-CM

## 2011-04-03 DIAGNOSIS — I080 Rheumatic disorders of both mitral and aortic valves: Secondary | ICD-10-CM

## 2011-04-03 DIAGNOSIS — E041 Nontoxic single thyroid nodule: Secondary | ICD-10-CM

## 2011-04-03 DIAGNOSIS — I359 Nonrheumatic aortic valve disorder, unspecified: Secondary | ICD-10-CM

## 2011-04-03 DIAGNOSIS — N259 Disorder resulting from impaired renal tubular function, unspecified: Secondary | ICD-10-CM

## 2011-04-03 DIAGNOSIS — I2510 Atherosclerotic heart disease of native coronary artery without angina pectoris: Secondary | ICD-10-CM

## 2011-04-03 NOTE — Progress Notes (Signed)
HPI:  Doing well.  Not sure what all the fuss is about and a bit tired of the all of the doctors.    Agreeable to seeing pulmonary medicine for a consult.  No real change in status, and overall seems to be getting along well.  No current chest pain.  She was scheduled for follow up with pulmonary with Dr. Vassie Loll, but did not keep it.  She saw him in consult at the time of her gen med admission for hemoptysis.  No recent hemoptysis.  CT is abnormal, but likely unclear with an an infiltrative process, but no symptoms.    Current Outpatient Prescriptions  Medication Sig Dispense Refill  . amLODipine (NORVASC) 10 MG tablet Take 10 mg by mouth daily.        Marland Kitchen aspirin 81 MG tablet Take 81 mg by mouth daily.        . calcitRIOL (ROCALTROL) 0.25 MCG capsule Take 0.25 mcg by mouth every other day.        . Coenzyme Q-10 100 MG capsule Take 100 mg by mouth daily.        . CRESTOR 40 MG tablet TAKE ONE TABLET BY MOUTH EVERY DAY  30 each  6  . furosemide (LASIX) 20 MG tablet Take 1 tablet (20 mg total) by mouth daily.  30 tablet  6  . metoprolol tartrate (LOPRESSOR) 25 MG tablet TAKE ONE TABLET BY MOUTH TWICE DAILY  60 tablet  6  . nitroGLYCERIN (NITROSTAT) 0.4 MG SL tablet Place 0.4 mg under the tongue every 5 (five) minutes as needed.        . polyethylene glycol powder (MIRALAX) powder Take 17 g by mouth daily. Take as needed.         Allergies  Allergen Reactions  . Fish Oil     Break out in scales    Past Medical History  Diagnosis Date  . CAD (coronary artery disease)     code STEMI with acute inferior posterior ST elevation myocardial infarction. subsequent CABG. Echo 04/2009: EF 45-50%; inf HK; severe LVH; mild AS/AI, mild LAE, PASP 31 mmHg  . Ischemic cardiomyopathy   . Systolic CHF, chronic   . Chronic kidney disease     stage 3  . Hypertension   . Hyperlipidemia   . Hiatal hernia     large  . Tobacco abuse   . Leg pain   . Cerebrovascular disease   . COPD (chronic obstructive  pulmonary disease)     presumed   . Arterial disease     peripheral arterial disease with claudication - bilateral SFA disease with ABI's 0.4-0.5 bilaterally   . Renal insufficiency     Past Surgical History  Procedure Date  . Percutaneous coronary intervention   . Laprotomy for bowel obstruction   . Coronary artery bypass graft     aortic thoraic anuerysm resecton 2010.     Family History  Problem Relation Age of Onset  . Coronary artery disease Mother     extensive family history; several other family members.  . Coronary artery disease Brother     History   Social History  . Marital Status: Widowed    Spouse Name: N/A    Number of Children: N/A  . Years of Education: N/A   Occupational History  . retired     Social History Main Topics  . Smoking status: Former Smoker    Quit date: 06/05/2008  . Smokeless tobacco: Not on file  Comment: She has a history of tobacco use but dos not quantify. She quit in 2010 at the time of her MI   . Alcohol Use: Not on file  . Drug Use: Not on file  . Sexually Active: Not on file   Other Topics Concern  . Not on file   Social History Narrative   She lives in [place]. She is retired and widowed.     ROS: Please see the HPI.  All other systems reviewed and negative.  PHYSICAL EXAM:  BP 142/78  Pulse 78  Ht 5\' 9"  (1.753 m)  Wt 148 lb (67.132 kg)  BMI 21.86 kg/m2  General: Thin, older female in no acute distress.   Head:  Normocephalic and atraumatic. Neck: no JVD.  Bilateral carotid bruits.   Lungs: Thin, prolonged expiration, but no definite rales.   Heart: Normal S1 and S2.  SEM, and 2/6 murmur at the apex.    Abdomen:  Normal bowel sounds; soft; non tender; no organomegaly Extremities: No clubbing or cyanosis. No edema. Neurologic: Alert and oriented x 3.  EKG:  NSR.  Incomplete LBBB vs LVH with repole changes.  No acute changes.  ECHO  12/2010   Study Conclusions  - Left ventricle: The cavity size  was mildly dilated. There was mild focal basal and mild concentric hypertrophy of the septum. Systolic function was mildly reduced. The estimated ejection fraction was in the range of 45% to 50%. There is hypokinesis of the basal-mid posterolateral myocardium. There is akinesis of the basalinferior myocardium. - Aortic valve: There was mild stenosis. Mild regurgitation. Mean gradient: 13mm Hg (S). Peak gradient: 20mm Hg (S). - Mitral valve: Mildly calcified annulus. Normal thickness leaflets . Moderate to severe regurgitation. - Left atrium: The atrium was moderately dilated. - Pulmonary arteries: Systolic pressure was mildly increased. PA peak pressure: 32mm Hg (S).   CT of CHEST CT CHEST WITHOUT CONTRAST  Technique: Multidetector CT imaging of the chest was performed following the standard protocol without IV contrast.  Comparison: 07/29/2010.  Findings: There are low attenuation lesions in the thyroid bilaterally, measuring up to 3.1 x 2.2 cm on the left, as before. Mediastinal lymph nodes measure up to 9 mm in short axis in the low right paratracheal station. Heart is enlarged. No pericardial effusion. Large hiatal hernia. Scattered mild peribronchovascular nodularity and bronchiectasis. Findings are worst in the right middle lobe. A 4 mm subpleural right lower lobe nodule (image 32) is unchanged. It has a tag to the adjacent pleura, suggesting a subpleural lymph node. New peribronchovascular irregular airspace consolidation and nodularity in the left lower lobe (image 43). No pleural fluid. Airway is otherwise unremarkable.  Incidental imaging of the upper abdomen shows an atrophic left kidney. No worrisome lytic or sclerotic lesions.  IMPRESSION:  1. New peribronchovascular airspace consolidation and nodularity in the left lower lobe, likely infectious in etiology. Follow-up to clearing is recommended. 2. Scattered peribronchovascular nodularity, bronchiectasis  and mild scarring, worst in the right middle lobe. 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 3. Large hiatal hernia. 4. Sizable bilateral thyroid nodules, as before. Previous sonographic evaluation on 08/02/2008. Please correlate clinically.  Original Report Authenticated By: Reyes Ivan, M.D.       Last Resulted: 03/19/11 12:01 PM           ASSESSMENT AND PLAN:

## 2011-04-03 NOTE — Patient Instructions (Signed)
Your physician wants you to follow-up in: 4 MONTHS.  You will receive a reminder letter in the mail two months in advance. If you don't receive a letter, please call our office to schedule the follow-up appointment.  Your physician recommends that you continue on your current medications as directed. Please refer to the Current Medication list given to you today.  

## 2011-04-19 NOTE — Assessment & Plan Note (Signed)
She did not have her follow up for this.  Will leave it alone.

## 2011-04-19 NOTE — Assessment & Plan Note (Signed)
No current angina 

## 2011-04-19 NOTE — Assessment & Plan Note (Signed)
See the above.  Has two valve disease.  Discrepancies in reads. Continue furosemide.  Not a good surgical candidate.

## 2011-04-19 NOTE — Assessment & Plan Note (Signed)
She has not followed up with renal either.  Now recent BMETs.

## 2011-04-19 NOTE — Assessment & Plan Note (Signed)
Had fairly high grade disease.  Sent her to see Dr. Myra Gianotti.  No definite follow up in place.  Will defer to the patient as to wether or not she wants to pursue this at this time.

## 2011-04-19 NOTE — Assessment & Plan Note (Signed)
See my note.  Has some AS and AI, as well as MR.  She appears hemodynamically stable, and given her age, we are treating her conservatively with volume management.  She has had at least one admission for volume overload previously.  However, her functional status at this time is very good.  We will continue her on her current regimen of low dose furosemide  (she does not like meds).  Watch electrolytes.

## 2011-04-19 NOTE — Assessment & Plan Note (Signed)
LDL is near target on maximal statin therapy.

## 2011-04-20 ENCOUNTER — Ambulatory Visit: Payer: Medicare Other | Admitting: Cardiology

## 2011-04-20 ENCOUNTER — Encounter: Payer: Self-pay | Admitting: Pulmonary Disease

## 2011-04-20 ENCOUNTER — Ambulatory Visit (INDEPENDENT_AMBULATORY_CARE_PROVIDER_SITE_OTHER): Payer: Medicare Other | Admitting: Pulmonary Disease

## 2011-04-20 VITALS — BP 140/78 | HR 80 | Temp 98.3°F | Ht 69.0 in | Wt 149.6 lb

## 2011-04-20 DIAGNOSIS — R918 Other nonspecific abnormal finding of lung field: Secondary | ICD-10-CM

## 2011-04-20 DIAGNOSIS — J479 Bronchiectasis, uncomplicated: Secondary | ICD-10-CM

## 2011-04-20 NOTE — Assessment & Plan Note (Signed)
The patient has definite bronchiectasis in various lobes on her recent chest CT, but currently does not give any history to suggest an active infection or exacerbation.  It is unclear whether this is idiopathic, or possibly related to occult aspiration from her large hiatal hernia.  I've had a long discussion with her about bronchiectasis, and explained that it can chronically cause cough and mucus production.  It can often be associated with COPD, and therefore will schedule her for full pulmonary function studies.  If she does indeed have airflow obstruction, she may benefit from a trial of bronchodilators that will also help her bronchiectasis symptoms.  Finally, I have told her it is crucial that we obtain a culture of her sputum if she begins to bring up discolored mucus.  This will allow [place] to isolate her colonizing flora, and better be able to tailor antibiotic therapy when she comes sick.

## 2011-04-20 NOTE — Patient Instructions (Signed)
Will schedule for breathing studies in 1-3 weeks, and see you back same day for review Will schedule for followup scan of your chest in 6mos to followup your larger "spot". Please call me if you begin to get more congested and cough up nasty colored mucus.  We need to get a culture of this to help guide treatment in the future.

## 2011-04-20 NOTE — Progress Notes (Signed)
  Subjective:    Patient ID: [name], female    DOB: [birth year], 78 y.o.   MRN: [id number]  HPI The patient is an 78 year old female who been asked to see for an abnormal CT chest.  Patient states in January of this year she developed a flulike illness, followed by significant chest congestion.  She was treated with antibiotics and had improvement, however continues to have some persistent pulmonary symptoms.  She does have an ongoing cough that is intermittent and is more of a tickle in the back of her throat.  She has minimal mucus production at this time that is white.  She denies any significant chest congestion.  She denies any significant dyspnea on exertion and feels that her "poor circulation" is more of an issue with walking distances than her breathing.  The patient did have a chest x-ray which showed some abnormal densities.  This was followed by a CT chest which showed bronchiectasis involving the right middle lobe and lingula, as well as both lower lobes.  There were nodular densities noted in a bronchovascular distribution, and also a larger nodule in the left lower lobe associated with bronchiectasis as well.  The patient denies frequent pulmonary infections during her life, and does not produce purulent mucus on a chronic basis.  She does have a very large hiatal hernia, but only admits to having occasional reflux symptoms.  She denies any significant regurgitation of food.   Review of Systems  Constitutional: Positive for appetite change. Negative for fever and unexpected weight change.  HENT: Negative for ear pain, nosebleeds, congestion, sore throat, rhinorrhea, sneezing, trouble swallowing, dental problem, postnasal drip and sinus pressure.   Eyes: Negative for redness and itching.  Respiratory: Positive for cough. Negative for chest tightness, shortness of breath and wheezing.   Cardiovascular: Negative for palpitations and leg swelling.  Gastrointestinal: Negative for  nausea and vomiting.  Genitourinary: Negative for dysuria.  Musculoskeletal: Negative for joint swelling.  Skin: Negative for rash.  Neurological: Negative for headaches.  Hematological: Does not bruise/bleed easily.  Psychiatric/Behavioral: Negative for dysphoric mood. The patient is not nervous/anxious.        Objective:   Physical Exam Constitutional:  Well developed, no acute distress  HENT:  Nares patent without discharge  Oropharynx without exudate, palate and uvula are normal  Eyes:  Perrla, eomi, no scleral icterus  Neck:  No JVD, no TMG  Cardiovascular:  Normal rate, regular rhythm, no rubs or gallops.  2/6 sem        Intact distal pulses but greatly diminished.  Pulmonary :  Normal breath sounds, no stridor or respiratory distress   No rales, rhonchi, but +faint bibasilar crackles.  Abdominal:  Soft, nondistended, bowel sounds present.  No tenderness noted.   Musculoskeletal:  No lower extremity edema noted, but very prominent varicosities..  Lymph Nodes:  No cervical lymphadenopathy noted  Skin:  No cyanosis noted  Neurologic:  Alert, appropriate, moves all 4 extremities without obvious deficit.         Assessment & Plan:

## 2011-04-20 NOTE — Assessment & Plan Note (Signed)
The patient has scattered pulmonary nodules, with the largest being in the left lower lobe.  I suspect this is all secondary to her bronchiectasis with probable MAC colonization.  The one in the left lower lobe needs to be watched however, given its size and her prior history of tobacco use.  We'll schedule for a followup CT in 6 months.

## 2011-04-21 ENCOUNTER — Institutional Professional Consult (permissible substitution): Payer: Medicare Other | Admitting: Pulmonary Disease

## 2011-04-23 ENCOUNTER — Telehealth: Payer: Self-pay

## 2011-04-23 NOTE — Telephone Encounter (Signed)
[name]  Tell her she should have her carotids done, and a BMET. If she is not going to see Dr. Myra Gianotti, though, it probably is not worth it. She has severe carotid disease. She also is do a Nutritional therapist.   (note from Dr Riley Kill)

## 2011-04-23 NOTE — Telephone Encounter (Signed)
I spoke with the pt and at this time she does not want to have a carotid duplex.  The pt saw Dr Myra Gianotti in July 2012 and he recommended medical management.  The pt said she cannot afford to pay for all these tests.  I also made the pt aware that Dr Riley Kill would like her to have a BMP drawn.  The pt said she is going to see Dr Arrie Aran on 05/11/11 and they will draw bloodwork.  I instructed the pt to have them send a copy of these labs to Dr Riley Kill for review. She agreed with plan.

## 2011-05-07 ENCOUNTER — Other Ambulatory Visit: Payer: Self-pay | Admitting: Cardiology

## 2011-05-13 ENCOUNTER — Encounter: Payer: Self-pay | Admitting: Pulmonary Disease

## 2011-05-13 ENCOUNTER — Ambulatory Visit (INDEPENDENT_AMBULATORY_CARE_PROVIDER_SITE_OTHER): Payer: Medicare Other | Admitting: Pulmonary Disease

## 2011-05-13 VITALS — BP 122/74 | HR 76 | Temp 97.9°F | Ht 69.0 in | Wt 149.0 lb

## 2011-05-13 DIAGNOSIS — R918 Other nonspecific abnormal finding of lung field: Secondary | ICD-10-CM

## 2011-05-13 DIAGNOSIS — J479 Bronchiectasis, uncomplicated: Secondary | ICD-10-CM

## 2011-05-13 DIAGNOSIS — J449 Chronic obstructive pulmonary disease, unspecified: Secondary | ICD-10-CM | POA: Insufficient documentation

## 2011-05-13 LAB — PULMONARY FUNCTION TEST

## 2011-05-13 NOTE — Patient Instructions (Signed)
Will try on dulera 100/5  2 inhalations am and pm everyday for next 4 weeks.  Rinse mouth well.  IF this does not help any of your symptoms, can discontinue.  If does help, call for prescription Albuterol inhaler 2 inhalations every 6hrs for emergencies only.  If doing well, followup with me in 6mos.  Call if you develop another infection.

## 2011-05-13 NOTE — Progress Notes (Signed)
  Subjective:    Patient ID: [name], female    DOB: 1927-03-27, 78 y.o.   MRN: [id number]  HPI The patient comes in today for review of her recent pulmonary function studies.  She has documented bronchiectasis on a recent CT chest, and has had persistent cough and congestion after what sounds like an acute exacerbation beginning of the year.  She was found to have mild airflow obstruction, mild restriction, and a mild reduction in DLCO that corrects with alveolar volume adjustment.  I have reviewed the study with her in detail, and answered all her questions.   Review of Systems  Constitutional: Negative for fever and unexpected weight change.  HENT: Negative for ear pain, nosebleeds, congestion, sore throat, rhinorrhea, sneezing, trouble swallowing, dental problem, postnasal drip and sinus pressure.   Eyes: Negative for redness and itching.  Respiratory: Negative for cough, chest tightness, shortness of breath and wheezing.   Cardiovascular: Positive for leg swelling. Negative for palpitations.  Gastrointestinal: Negative for nausea and vomiting.  Genitourinary: Negative for dysuria.  Musculoskeletal: Negative for joint swelling.  Skin: Negative for rash.  Neurological: Positive for headaches.  Hematological: Bruises/bleeds easily.  Psychiatric/Behavioral: Negative for dysphoric mood. The patient is not nervous/anxious.        Objective:   Physical Exam Well-developed female in no acute distress Nose without purulence or discharge noted Chest with mild decreased breath sounds a few scattered crackles, no wheezing Cardiac exam with regular rate and rhythm Lower extremities without edema, no cyanosis Alert and oriented, moves all 4 extremities.       Assessment & Plan:

## 2011-05-13 NOTE — Progress Notes (Signed)
PFT done today. 

## 2011-05-13 NOTE — Assessment & Plan Note (Signed)
The patient has been stable since the last visit, but her PFTs today do show mild COPD with air trapping.  It is unclear whether this is secondary to her known bronchiectasis, or whether there may be some emphysema from her years of smoking.  Would like to try her on a bronchodilator regimen to see how she responds.  This may help with her cough and perceived chest congestion, and may also limit her flares of bronchiectasis.  The patient is willing to give this a try.

## 2011-05-18 ENCOUNTER — Encounter: Payer: Self-pay | Admitting: Pulmonary Disease

## 2011-07-10 ENCOUNTER — Encounter: Payer: Self-pay | Admitting: Nurse Practitioner

## 2011-07-13 ENCOUNTER — Telehealth: Payer: Self-pay | Admitting: Cardiology

## 2011-07-13 NOTE — Telephone Encounter (Signed)
I spoke with the pt and she went to [place] on a tour bus last Thursday to see a play.  The pt ate dinner and then developed CP and vomiting during the play.  The pt was transported to a local hospital by EMS and was hospitalized for 2 days.  The pt said they told her she had a heart attack in the past.  The pt said her medications were changed and they stopped amlodipine and started lisinopril and plavix.  The pt has a history of renal disease and anemia.  I made the pt aware that she needs to be evaluated in our office so that we can review her records and make changes in the medications. The pt will be seen on 07/14/11. The pt will bring records and all medications to appointment.

## 2011-07-13 NOTE — Telephone Encounter (Signed)
Please return call to patient at [phone] regarding weekend visit to [facility] and their request for BMET.

## 2011-07-14 ENCOUNTER — Encounter: Payer: Self-pay | Admitting: Nurse Practitioner

## 2011-07-14 ENCOUNTER — Ambulatory Visit (INDEPENDENT_AMBULATORY_CARE_PROVIDER_SITE_OTHER): Payer: Medicare Other | Admitting: Nurse Practitioner

## 2011-07-14 VITALS — BP 158/88 | HR 91 | Ht 68.0 in | Wt 147.0 lb

## 2011-07-14 DIAGNOSIS — N184 Chronic kidney disease, stage 4 (severe): Secondary | ICD-10-CM

## 2011-07-14 DIAGNOSIS — I10 Essential (primary) hypertension: Secondary | ICD-10-CM

## 2011-07-14 DIAGNOSIS — I059 Rheumatic mitral valve disease, unspecified: Secondary | ICD-10-CM

## 2011-07-14 DIAGNOSIS — R079 Chest pain, unspecified: Secondary | ICD-10-CM | POA: Insufficient documentation

## 2011-07-14 DIAGNOSIS — I2510 Atherosclerotic heart disease of native coronary artery without angina pectoris: Secondary | ICD-10-CM

## 2011-07-14 DIAGNOSIS — I340 Nonrheumatic mitral (valve) insufficiency: Secondary | ICD-10-CM

## 2011-07-14 MED ORDER — NITROGLYCERIN 0.4 MG SL SUBL: 0.4000 mg | SUBLINGUAL_TABLET | SUBLINGUAL | Status: DC | PRN

## 2011-07-14 NOTE — Patient Instructions (Addendum)
Your physician recommends that you schedule a follow-up appointment in: 1 week with Tereso Newcomer, PA-C Your physician has recommended you make the following change in your medication: STOP Plavix and Lisinopril

## 2011-07-14 NOTE — Progress Notes (Signed)
Patient Name: [name] Date of Encounter: 07/14/2011  Primary Care Provider:  Kaleen Mask, MD Primary Cardiologist:  T. Riley Kill, MD  Patient Profile  78 year old female with history of CAD and ischemic myopathy who presents for followup after recent hospitalization for chest pain.  Problem List   Past Medical History  Diagnosis Date  . CAD (coronary artery disease)     a. 07/2008 Inf STEMI->RCA BMS;  b. 09/2008 CABGx2: LIMA->LAD, RIMA->OM as Y from LIMA;  b. 06/2011 MV EF 31%, bsaal to dist ant infarct, inf/inflat infarct with mild to mod peri-infarct ischemia [facility]).  . Ischemic cardiomyopathy     a. 06/2011 Echo: EF 40%, inf/inflat AK and lat HK, aortic sclerosis w/ mild AI, Mild to mod MR (prev mod-sev), Gr 1 DD, mild TR, RVSP  . Systolic CHF, chronic     a. 06/2011 EF 40%  . Hypertension   . Hyperlipidemia   . Hiatal hernia     large  . Tobacco abuse   . Leg pain   . Cerebrovascular disease   . COPD (chronic obstructive pulmonary disease)     presumed   . Arterial disease     peripheral arterial disease with claudication - bilateral SFA disease with ABI's 0.4-0.5 bilaterally   . CKD (chronic kidney disease), stage IV   . Mitral regurgitation     a. prev Mod-Sev, read as mild to mod by echo 06/2011 [facility]).   Past Surgical History  Procedure Date  . Percutaneous coronary intervention   . Laprotomy for bowel obstruction   . Coronary artery bypass graft     aortic thoraic anuerysm resecton 2010.   Marland Kitchen Coronary stent placement   . Cardiac valve surgery     Allergies  Allergies  Allergen Reactions  . Fish Oil     Break out in scales    HPI  78 y/o female with the above problem list.  She was in her US[place] until last Thursday when she was on a bus tour that included dinner and a play in [place].  After dinner and on the way to the play, while in the bus, she began to experience nausea and mild left chest discomfort.  This  persisted and worsened and by the time she arrived @ the play, she developed vomiting.  As a result, EMS was called and she was taken to [facility].  En route, she was treated with SL ntg w/o much effect and subsequently an anti-emetic, which helped to relieve Ss.  After approx 4 hrs, she was symptom free.  She ruled out for MI and was seen by cardiology.  An echo and myoview were performed in the hospital and results are outlined above.  Most notably, myoview showed basal to distal ant and inferolat infarct with mild to mod peri-infarct ischemia.  EF was reduced on echo, which is not a new finding.  Pt says that cardiology rec cath however, given her Stage IV CKD, she wished to defer and wanted to review things with Dr. Riley Kill.  She was d/c'd and placed on plavix and lisinopril and told to f/u with [place] and have a repeat bmet.  Since the initial episode, she has had no further n, v, chest discomfort.  Home Medications  Prior to Admission medications   Medication Sig Start Date End Date Taking? Authorizing Provider  amLODipine (NORVASC) 10 MG tablet Take 10 mg by mouth daily.     Yes Historical Provider,  MD  aspirin 81 MG tablet Take 81 mg by mouth daily.     Yes Historical Provider, MD  Coenzyme Q-10 100 MG capsule Take 100 mg by mouth daily.     Yes Historical Provider, MD  CRESTOR 40 MG tablet TAKE ONE TABLET BY MOUTH EVERY DAY 02/02/11  Yes Herby Abraham, MD  furosemide (LASIX) 20 MG tablet TAKE ONE TABLET BY MOUTH EVERY DAY 05/07/11  Yes Herby Abraham, MD  metoprolol tartrate (LOPRESSOR) 25 MG tablet TAKE ONE TABLET BY MOUTH TWICE DAILY 01/08/11  Yes Herby Abraham, MD  nitroGLYCERIN (NITROSTAT) 0.4 MG SL tablet Place 1 tablet (0.4 mg total) under the tongue every 5 (five) minutes as needed. 07/14/11  Yes Ok Anis, NP  polyethylene glycol powder (MIRALAX) powder Take 17 g by mouth daily. Take as needed.    Yes Historical Provider, MD    Review of Systems  Nausea,  vomiting and left chest discomfort as outlined above.  She denies pnd, orthopnea, n, v, dizziness, syncope, edema, or early satiety. All other systems reviewed and are otherwise negative except as noted above.  Physical Exam  Blood pressure 158/88, pulse 91, height 5\' 8"  (1.727 m), weight 147 lb (66.679 kg).  General: Pleasant, NAD Psych: Normal affect. Neuro: Alert and oriented X 3. Moves all extremities spontaneously. HEENT: Normal  Neck: Supple without bruits or JVD. Lungs:  Resp regular and unlabored, CTA. Heart: RRR no s3, s4.  2/6 syst murmur @ usb and apex. Abdomen: Soft, non-tender, non-distended, BS + x 4.  Extremities: No clubbing, cyanosis or edema. DP/PT/Radials 2+ and equal bilaterally.  Accessory Clinical Findings  ECG - rsr, inc lbbb, lvh, lat twi. - no acute changes.  Assessment & Plan  1.  Left-sided chest pain/CAD: Patient had an episode of chest discomfort in a setting of nausea with vomiting lasting for hours and resolving with anti-emetic therapy.  She was evaluated at an outside hospital and had no objective evidence of ischemia by enzymes or EKG.  Myoview showed mild to moderate peri-infarct ischemia.  LV function is stable.  She has had no further symptoms.  I have reviewed her case with Dr. Riley Kill and we have agreed that continued medical therapy is warranted here.  Patient is in agreement with this plan also.  Of note, patient was placed on Plavix therapy however in the absence of recent MI or stenting, we have discontinued this and have recommended ongoing Plavix therapy.  She remains on beta blocker and statin.  We will arrange for followup in one week to reevaluate as to whether or not she is having more symptoms.  Because of her renal disease, she is not an ideal catheter candidate.  She has stated many times that she will has no plans to ever go on dialysis.  2.  Ischemic cardio myopathy/chronic systolic congestive heart failure: Stable volume.  Note symptoms  of dyspnea, orthopnea, or volume excess.  She remained on beta blocker therapy.  She was placed on lisinopril at the outside hospital which we have taken the liberty to discontinue given her long-term renal disease which is followed closely by nephrology.  She says she only took one dose following her discharge and hasn't taken any in a few days.  3.  Hypertension: Blood pressure elevated today in clinic.  She has not taken her medications yet today.  Notably, when her lisinopril was added, amlodipine was discontinued.  As we have discontinued lisinopril, we are adding back amlodipine.  4.  Hyperlipidemia: Continue statin therapy.  LDL was 79 in December 2012.  5.  Stage IV chronic kidney disease: Followed closely by nephrology.  We have discontinued lisinopril as above.  6.  Mitral Regurg/AI:  Stable by recent echo.  Pt is not felt to be a surgical candidate.  7. Dispo:  F/u 1 wk.  She says she may cancel this appt if she is feeling well as she already has f/u with Dr. Riley Kill on 7/22 and does not want to pay 3 copays this month.    Nicolasa Ducking, NP 07/14/2011, 12:50 PM

## 2011-07-21 ENCOUNTER — Ambulatory Visit: Payer: Medicare Other | Admitting: Physician Assistant

## 2011-08-03 ENCOUNTER — Encounter: Payer: Self-pay | Admitting: Cardiology

## 2011-08-03 ENCOUNTER — Ambulatory Visit (INDEPENDENT_AMBULATORY_CARE_PROVIDER_SITE_OTHER): Payer: Medicare Other | Admitting: Cardiology

## 2011-08-03 VITALS — BP 150/86 | HR 64 | Ht 68.0 in | Wt 148.0 lb

## 2011-08-03 DIAGNOSIS — I6529 Occlusion and stenosis of unspecified carotid artery: Secondary | ICD-10-CM

## 2011-08-03 DIAGNOSIS — I2510 Atherosclerotic heart disease of native coronary artery without angina pectoris: Secondary | ICD-10-CM

## 2011-08-03 DIAGNOSIS — I080 Rheumatic disorders of both mitral and aortic valves: Secondary | ICD-10-CM

## 2011-08-03 DIAGNOSIS — I359 Nonrheumatic aortic valve disorder, unspecified: Secondary | ICD-10-CM

## 2011-08-03 DIAGNOSIS — J449 Chronic obstructive pulmonary disease, unspecified: Secondary | ICD-10-CM

## 2011-08-03 DIAGNOSIS — N184 Chronic kidney disease, stage 4 (severe): Secondary | ICD-10-CM

## 2011-08-03 NOTE — Assessment & Plan Note (Signed)
Scheduled for follow up with Dr. Aretta Nip at [facility].  Labs were done last week.

## 2011-08-03 NOTE — Patient Instructions (Addendum)
Your physician recommends that you schedule a follow-up appointment in: October 2013 with Dr Riley Kill  Your physician has requested that you have a carotid duplex. This test is an ultrasound of the carotid arteries in your neck. It looks at blood flow through these arteries that supply the brain with blood. Allow one hour for this exam. There are no restrictions or special instructions.  Your physician recommends that you continue on your current medications as directed. Please refer to the Current Medication list given to you today.

## 2011-08-03 NOTE — Progress Notes (Signed)
HPI:  The patient was admitted to the hospital in [place], and a number of medication changes made. She's recently followed up with a nephrologist and had her labs done but she doesn't see Dr. Salena Saner until October.  She now says she is scheduled to be seen both pulmonary and nephrology clinics in October of this year. Since she was last seen in clinic here she's not had any recurrent chest pain. She's actually feeling pretty good. She decided she was not to take any Plavix and really didn't allow the medication filled. She currently is stable.  Current Outpatient Prescriptions  Medication Sig Dispense Refill  . amLODipine (NORVASC) 10 MG tablet Take 10 mg by mouth daily.        Marland Kitchen aspirin 81 MG tablet Take 81 mg by mouth daily.        . Coenzyme Q-10 100 MG capsule Take 100 mg by mouth daily.        . CRESTOR 40 MG tablet TAKE ONE TABLET BY MOUTH EVERY DAY  30 each  6  . furosemide (LASIX) 20 MG tablet TAKE ONE TABLET BY MOUTH EVERY DAY  30 tablet  6  . metoprolol tartrate (LOPRESSOR) 25 MG tablet TAKE ONE TABLET BY MOUTH TWICE DAILY  60 tablet  6  . nitroGLYCERIN (NITROSTAT) 0.4 MG SL tablet Place 1 tablet (0.4 mg total) under the tongue every 5 (five) minutes as needed.  25 tablet  3  . polyethylene glycol powder (MIRALAX) powder Take 17 g by mouth daily. Take as needed.         Allergies  Allergen Reactions  . Fish Oil     Break out in scales    Past Medical History  Diagnosis Date  . CAD (coronary artery disease)     a. 07/2008 Inf STEMI->RCA BMS;  b. 09/2008 CABGx2: LIMA->LAD, RIMA->OM as Y from [facility]A;  b. 06/2011 MV EF 31%, bsaal to dist ant infarct, inf/inflat infarct with mild to mod peri-infarct ischemia [facility]).  . Ischemic cardiomyopathy     a. 06/2011 Echo: EF 40%, inf/inflat AK and lat HK, aortic sclerosis w/ mild AI, Mild to mod MR (prev mod-sev), Gr 1 DD, mild TR, RVSP  . Systolic CHF, chronic     a. 06/2011 EF 40%  . Hypertension   . Hyperlipidemia   . Hiatal  hernia     large  . Tobacco abuse   . Leg pain   . Cerebrovascular disease   . COPD (chronic obstructive pulmonary disease)     presumed   . Arterial disease     peripheral arterial disease with claudication - bilateral SFA disease with ABI's 0.4-0.5 bilaterally   . CKD (chronic kidney disease), stage IV   . Mitral regurgitation     a. prev Mod-Sev, read as mild to mod by echo 06/2011 [facility]).    Past Surgical History  Procedure Date  . Percutaneous coronary intervention   . Laprotomy for bowel obstruction   . Coronary artery bypass graft     aortic thoraic anuerysm resecton 2010.   Marland Kitchen Coronary stent placement   . Cardiac valve surgery     Family History  Problem Relation Age of Onset  . Coronary artery disease Mother     extensive family history; several other family members.  . Coronary artery disease Brother   . Heart disease Mother     CHF  . Leukemia Sister   . Leukemia Sister   .  Leukemia Father     History   Social History  . Marital Status: Widowed    Spouse Name: N/A    Number of Children: Y  . Years of Education: N/A   Occupational History  . 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    Social History Main Topics  . Smoking status: Former Smoker -- 1.0 packs/day for 60 years    Types: Cigarettes    Quit date: 07/12/2008  . Smokeless tobacco: Not on file  . Alcohol Use: Not on file  . Drug Use: Not on file  . Sexually Active: Not on file   Other Topics Concern  . Not on file   Social History Narrative   She lives in [place]. She is retired and widowed.     ROS: Please see the HPI.  All other systems reviewed and negative.  PHYSICAL EXAM:  BP 150/86  Pulse 64  Ht 5\' 8"  (1.727 m)  Wt 148 lb (67.132 kg)  BMI 22.50 kg/m2  General: Well developed, well nourished, in no acute distress. Head:  Normocephalic and atraumatic. Neck: no JVD Lungs: Clear to auscultation and percussion. Heart: Normal S1 and S2.  No murmur,  rubs or gallops.  Abdomen:  Normal bowel sounds; soft; non tender; no organomegaly Pulses: Pulses normal in all 4 extremities. Extremities: No clubbing or cyanosis. No edema. Neurologic: Alert and oriented x 3.  EKG:  NSR.  T wave inversion laterally, with no change since last tracing.   ASSESSMENT AND PLAN:

## 2011-08-03 NOTE — Assessment & Plan Note (Signed)
She is stable.  Lungs clear.

## 2011-08-03 NOTE — Assessment & Plan Note (Signed)
Has now agreed to have a repeat study. Will arrange

## 2011-08-03 NOTE — Assessment & Plan Note (Signed)
Symptoms of chest pain are resolved.

## 2011-08-03 NOTE — Assessment & Plan Note (Signed)
Has agreed to see Dr. Shelle Iron again in the fall.

## 2011-08-09 NOTE — Assessment & Plan Note (Signed)
See most recent echocardiogram done.

## 2011-08-15 ENCOUNTER — Other Ambulatory Visit: Payer: Self-pay | Admitting: Cardiology

## 2011-08-18 ENCOUNTER — Encounter (INDEPENDENT_AMBULATORY_CARE_PROVIDER_SITE_OTHER): Payer: Medicare Other

## 2011-08-18 ENCOUNTER — Telehealth: Payer: Self-pay | Admitting: Cardiology

## 2011-08-18 DIAGNOSIS — I6529 Occlusion and stenosis of unspecified carotid artery: Secondary | ICD-10-CM

## 2011-08-18 NOTE — Telephone Encounter (Signed)
I spoke with the pt and she has a hiatal hernia that is making her have acid reflux.  The pt said her brother takes omeprazole and she wanted to know if this is something she can take for reflux.  The pt is no longer on plavix and I made her aware that this medication is okay to take.  I made her aware that this is available over the counter or could be prescribed by PCP.

## 2011-08-18 NOTE — Telephone Encounter (Signed)
Please return call to patient [phone] hm# to discuss possible medication change.

## 2011-09-29 ENCOUNTER — Other Ambulatory Visit: Payer: Medicare Other

## 2011-10-06 ENCOUNTER — Other Ambulatory Visit: Payer: Self-pay | Admitting: Cardiology

## 2011-10-06 NOTE — Telephone Encounter (Signed)
..   Requested Prescriptions   Pending Prescriptions Disp Refills  . CRESTOR 40 MG tablet [Pharmacy Med Name: CRESTOR 40MG        TAB] 30 tablet 6    Sig: TAKE ONE TABLET BY MOUTH EVERY DAY   

## 2011-10-07 ENCOUNTER — Telehealth: Payer: Self-pay

## 2011-10-07 NOTE — Telephone Encounter (Signed)
I spoke with the pt and made her aware of 08/18/11 carotid results. Dr Riley Kill did review these results and recommended that the pt have carotids rechecked again in 6 months due to moderate stenosis.  The pt has also had biopsies performed on her thyroid in the past.

## 2011-10-12 NOTE — Telephone Encounter (Signed)
As noted above.  Findings are as discussed.  TS

## 2011-10-26 ENCOUNTER — Telehealth: Payer: Self-pay | Admitting: Cardiology

## 2011-10-26 MED ORDER — AMLODIPINE BESYLATE 10 MG PO TABS: 10.0000 mg | ORAL_TABLET | Freq: Every day | ORAL | Status: DC

## 2011-10-26 NOTE — Telephone Encounter (Signed)
New Problem:    Patient called in needing a refill of her amLODipine (NORVASC) 10 MG tablet.

## 2011-11-03 ENCOUNTER — Encounter: Payer: Self-pay | Admitting: Cardiology

## 2011-11-03 ENCOUNTER — Ambulatory Visit (INDEPENDENT_AMBULATORY_CARE_PROVIDER_SITE_OTHER): Payer: Medicare Other | Admitting: Cardiology

## 2011-11-03 VITALS — BP 140/76 | HR 60 | Ht 66.5 in | Wt 147.1 lb

## 2011-11-03 DIAGNOSIS — I6529 Occlusion and stenosis of unspecified carotid artery: Secondary | ICD-10-CM

## 2011-11-03 DIAGNOSIS — J479 Bronchiectasis, uncomplicated: Secondary | ICD-10-CM

## 2011-11-03 DIAGNOSIS — I2510 Atherosclerotic heart disease of native coronary artery without angina pectoris: Secondary | ICD-10-CM

## 2011-11-03 DIAGNOSIS — N184 Chronic kidney disease, stage 4 (severe): Secondary | ICD-10-CM

## 2011-11-03 DIAGNOSIS — I70219 Atherosclerosis of native arteries of extremities with intermittent claudication, unspecified extremity: Secondary | ICD-10-CM

## 2011-11-03 DIAGNOSIS — I25810 Atherosclerosis of coronary artery bypass graft(s) without angina pectoris: Secondary | ICD-10-CM

## 2011-11-03 DIAGNOSIS — I359 Nonrheumatic aortic valve disorder, unspecified: Secondary | ICD-10-CM

## 2011-11-03 NOTE — Progress Notes (Signed)
HPI:  The patient is seen in followup. She continues to see multiple doctors. She is followed in renal clinic, and just had laboratory studies done there a few days ago. She also had a urinary tract infection. She also had previously seen the [facility], but she did not followup with them. She has bilateral carotid disease, and this is been recently checked. In addition she has symptoms compatible with peripheral vascular disease. She does walk some, but is fairly limited what she can do. She's her last study about a year ago. She has no desire to have any surgery related to this. She also has followup scheduled with Dr. Shelle Iron, and this is for her abnormal CT scan which suggests bronchiectasis.  She's not had progressive shortness of breath, but is otherwise been stable. She is on low-dose diuretic.  Current Outpatient Prescriptions  Medication Sig Dispense Refill  . amLODipine (NORVASC) 10 MG tablet Take 1 tablet (10 mg total) by mouth daily.  30 tablet  6  . aspirin 81 MG tablet Take 81 mg by mouth daily.        . Coenzyme Q-10 100 MG capsule Take 100 mg by mouth daily.        . CRESTOR 40 MG tablet TAKE ONE TABLET BY MOUTH EVERY DAY  30 tablet  6  . furosemide (LASIX) 20 MG tablet TAKE ONE TABLET BY MOUTH EVERY DAY  30 tablet  6  . metoprolol tartrate (LOPRESSOR) 25 MG tablet TAKE ONE TABLET BY MOUTH TWICE DAILY  60 tablet  11  . nitroGLYCERIN (NITROSTAT) 0.4 MG SL tablet Place 1 tablet (0.4 mg total) under the tongue every 5 (five) minutes as needed.  25 tablet  3  . polyethylene glycol powder (MIRALAX) powder Take 17 g by mouth daily. Take as needed.       . zolpidem (AMBIEN) 5 MG tablet Take 5 mg by mouth at bedtime as needed.         Allergies  Allergen Reactions  . Fish Oil     Break out in scales    Past Medical History  Diagnosis Date  . CAD (coronary artery disease)     a. 07/2008 Inf STEMI->RCA BMS;  b. 09/2008 CABGx2: LIMA->LAD, RIMA->OM as Y from [facility];  b. 06/2011 MV EF 31%, bsaal to  dist ant infarct, inf/inflat infarct with mild to mod peri-infarct ischemia [facility]).  . Ischemic cardiomyopathy     a. 06/2011 Echo: EF 40%, inf/inflat AK and lat HK, aortic sclerosis w/ mild AI, Mild to mod MR (prev mod-sev), Gr 1 DD, mild TR, RVSP  . Systolic CHF, chronic     a. 06/2011 EF 40%  . Hypertension   . Hyperlipidemia   . Hiatal hernia     large  . Tobacco abuse   . Leg pain   . Cerebrovascular disease   . COPD (chronic obstructive pulmonary disease)     presumed   . Arterial disease     peripheral arterial disease with claudication - bilateral SFA disease with ABI's 0.4-0.5 bilaterally   . CKD (chronic kidney disease), stage IV   . Mitral regurgitation     a. prev Mod-Sev, read as mild to mod by echo 06/2011 [facility]).    Past Surgical History  Procedure Date  . Percutaneous coronary intervention   . Laprotomy for bowel obstruction   . Coronary artery bypass graft     aortic thoraic anuerysm resecton 2010.   Marland Kitchen Coronary  stent placement   . Cardiac valve surgery     Family History  Problem Relation Age of Onset  . Coronary artery disease Mother     extensive family history; several other family members.  . Coronary artery disease Brother   . Heart disease Mother     CHF  . Leukemia Sister   . Leukemia Sister   . Leukemia Father     History   Social History  . Marital Status: Widowed    Spouse Name: N/A    Number of Children: Y  . Years of Education: N/A   Occupational History  . 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    Social History Main Topics  . Smoking status: Former Smoker -- 1.0 packs/day for 60 years    Types: Cigarettes    Quit date: 07/12/2008  . Smokeless tobacco: Not on file  . Alcohol Use: Not on file  . Drug Use: Not on file  . Sexually Active: Not on file   Other Topics Concern  . Not on file   Social History Narrative   She lives in [place]. She is retired and widowed.      ROS: Please see the HPI.  All other systems reviewed and negative.  PHYSICAL EXAM:  BP 140/76  Pulse 60  Ht 5' 6.5" (1.689 m)  Wt 147 lb 1.9 oz (66.733 kg)  BMI 23.39 kg/m2  General: Well developed, well nourished, in no acute distress. Head:  Normocephalic and atraumatic. Neck: no JVD.  Bilateral carotid bruits, left greater than right.   Lungs: Clear to auscultation and percussion, but prolonged expiration. Heart: Normal S1 and S2.  SEM, minimal diastolic blow.    Pulses: Pulses normal in all 4 extremities. Extremities: No clubbing or cyanosis. No edema. Neurologic: Alert and oriented x 3.  EKG:  NSR. iLBBB vs LVH.  Leftward axis.    ASSESSMENT AND PLAN:

## 2011-11-03 NOTE — Assessment & Plan Note (Signed)
The patient has mild AI by echo, as well as mild to moderate MR. His ejection fraction in the range of 40%. She has had volume overload the past, but currently is hemodynamically stable. She is on diuretics. She has renal dysfunction and is being followed in the renal clinic. We'll continue to monitor her closely, and continue to encourage close followup over time.

## 2011-11-03 NOTE — Assessment & Plan Note (Signed)
No current symptoms at the present time. She continues to remain stable from a cardiac standpoint

## 2011-11-03 NOTE — Assessment & Plan Note (Signed)
We had trouble getting her back to the VS, but she is agreed to have repeat carotid Dopplers done for followup of her carotid disease.

## 2011-11-03 NOTE — Assessment & Plan Note (Signed)
The patient has peripheral vascular disease, and this is evidenced by her previous ABIs done in June of last year. However the present time she continues on medical therapy

## 2011-11-03 NOTE — Assessment & Plan Note (Signed)
See notes regarding this.

## 2011-11-03 NOTE — Assessment & Plan Note (Signed)
She is currently being followed in renal clinic.

## 2011-11-03 NOTE — Patient Instructions (Addendum)
Your physician recommends that you schedule a follow-up appointment in: JANUARY 2014 with Dr Riley Kill  Your physician recommends that you continue on your current medications as directed. Please refer to the Current Medication list given to you today.  Your physician has requested that you have a carotid duplex in February 2014. This test is an ultrasound of the carotid arteries in your neck. It looks at blood flow through these arteries that supply the brain with blood. Allow one hour for this exam. There are no restrictions or special instructions.

## 2011-11-13 ENCOUNTER — Ambulatory Visit: Payer: Medicare Other | Admitting: Pulmonary Disease

## 2011-11-17 ENCOUNTER — Ambulatory Visit (INDEPENDENT_AMBULATORY_CARE_PROVIDER_SITE_OTHER): Payer: Medicare Other | Admitting: Pulmonary Disease

## 2011-11-17 ENCOUNTER — Encounter: Payer: Self-pay | Admitting: Pulmonary Disease

## 2011-11-17 VITALS — BP 118/72 | HR 68 | Temp 98.1°F | Ht 67.0 in | Wt 150.0 lb

## 2011-11-17 DIAGNOSIS — J479 Bronchiectasis, uncomplicated: Secondary | ICD-10-CM

## 2011-11-17 DIAGNOSIS — R918 Other nonspecific abnormal finding of lung field: Secondary | ICD-10-CM

## 2011-11-17 DIAGNOSIS — J449 Chronic obstructive pulmonary disease, unspecified: Secondary | ICD-10-CM

## 2011-11-17 NOTE — Progress Notes (Signed)
  Subjective:    Patient ID: [name], female    DOB: 06-Nov-1927, 78 y.o.   MRN: [id number]  HPI    Review of Systems  Constitutional: Negative for fever and unexpected weight change.  HENT: Negative for ear pain, nosebleeds, congestion, sore throat, rhinorrhea, sneezing, trouble swallowing, dental problem, postnasal drip and sinus pressure.   Eyes: Negative for redness and itching.  Respiratory: Negative for cough, chest tightness, shortness of breath and wheezing.   Cardiovascular: Positive for leg swelling. Negative for palpitations.  Gastrointestinal: Negative for nausea and vomiting.  Genitourinary: Negative for dysuria.  Musculoskeletal: Positive for joint swelling.  Skin: Negative for rash.  Neurological: Negative for headaches.  Hematological: Does not bruise/bleed easily.  Psychiatric/Behavioral: Negative for dysphoric mood. The patient is not nervous/anxious.        Objective:   Physical Exam        Assessment & Plan:

## 2011-11-17 NOTE — Assessment & Plan Note (Signed)
The patient is due for a followup scan this month for her nodules, the majority of which I suspect are inflammatory.  She has one dominant nodule that I think we need to follow closely in light of her smoking history.

## 2011-11-17 NOTE — Assessment & Plan Note (Signed)
The patient has not had an acute exacerbation since the last visit, and denies any issues with significant chest congestion or mucus production.

## 2011-11-17 NOTE — Patient Instructions (Addendum)
Will leave off inhaled medications for now since you feel you are doing well.  Let me know if your breathing worsens, and we can discuss starting on something to see if helps Will schedule for followup ct scan for your nodules.  Will call you with results. followup with me in one year if doing well, but call if having breathing issues.

## 2011-11-17 NOTE — Assessment & Plan Note (Signed)
The patient never started on a bronchodilator regimen for her documented airflow limitation, and states that she is too scared of the side effects to even try.  She feels that her breathing is at an adequate level and does not interfere with her quality of life.  I have asked her to call me if she changes her mind, or if she has worsening symptoms.

## 2011-11-24 ENCOUNTER — Ambulatory Visit (INDEPENDENT_AMBULATORY_CARE_PROVIDER_SITE_OTHER)
Admission: RE | Admit: 2011-11-24 | Discharge: 2011-11-24 | Disposition: A | Discharge status: Home / Self Care | Payer: Medicare Other | Source: Ambulatory Visit | Attending: Pulmonary Disease | Admitting: Pulmonary Disease

## 2011-11-24 DIAGNOSIS — R918 Other nonspecific abnormal finding of lung field: Secondary | ICD-10-CM

## 2011-12-09 ENCOUNTER — Other Ambulatory Visit: Payer: Self-pay | Admitting: *Deleted

## 2011-12-09 MED ORDER — FUROSEMIDE 20 MG PO TABS: 20.0000 mg | ORAL_TABLET | Freq: Every day | ORAL | Status: DC

## 2012-02-02 ENCOUNTER — Ambulatory Visit (INDEPENDENT_AMBULATORY_CARE_PROVIDER_SITE_OTHER): Payer: Medicare Other | Admitting: Cardiology

## 2012-02-02 ENCOUNTER — Encounter: Payer: Self-pay | Admitting: Cardiology

## 2012-02-02 VITALS — BP 139/80 | HR 70 | Ht 66.5 in | Wt 148.0 lb

## 2012-02-02 DIAGNOSIS — E785 Hyperlipidemia, unspecified: Secondary | ICD-10-CM

## 2012-02-02 DIAGNOSIS — I080 Rheumatic disorders of both mitral and aortic valves: Secondary | ICD-10-CM

## 2012-02-02 DIAGNOSIS — J479 Bronchiectasis, uncomplicated: Secondary | ICD-10-CM

## 2012-02-02 DIAGNOSIS — N184 Chronic kidney disease, stage 4 (severe): Secondary | ICD-10-CM

## 2012-02-02 DIAGNOSIS — I2589 Other forms of chronic ischemic heart disease: Secondary | ICD-10-CM

## 2012-02-02 DIAGNOSIS — I10 Essential (primary) hypertension: Secondary | ICD-10-CM

## 2012-02-02 DIAGNOSIS — I6529 Occlusion and stenosis of unspecified carotid artery: Secondary | ICD-10-CM

## 2012-02-02 DIAGNOSIS — E7800 Pure hypercholesterolemia, unspecified: Secondary | ICD-10-CM

## 2012-02-02 DIAGNOSIS — F17200 Nicotine dependence, unspecified, uncomplicated: Secondary | ICD-10-CM

## 2012-02-02 DIAGNOSIS — I255 Ischemic cardiomyopathy: Secondary | ICD-10-CM

## 2012-02-02 DIAGNOSIS — I2510 Atherosclerotic heart disease of native coronary artery without angina pectoris: Secondary | ICD-10-CM

## 2012-02-02 NOTE — Patient Instructions (Addendum)
Your physician wants you to follow-up in: 6 months with Dr. Clifton James.  You will receive a reminder letter in the mail two months in advance. If you don't receive a letter, please call our office to schedule the follow-up appointment.   LABS TODAY:  CBC, Lipid and Liver

## 2012-02-02 NOTE — Progress Notes (Signed)
HPI:  The patient returns today in a followup visit. From a cardiac standpoint she is stable. She's been reluctant to followup on a number of appointments we have set up for her overall period time, not so much in cardiology, but with a variety of other issues. Nonetheless, she remains upbeat. She's not followed up in the vascular clinic.  She has severe bial  Current Outpatient Prescriptions  Medication Sig Dispense Refill  . amLODipine (NORVASC) 10 MG tablet Take 1 tablet (10 mg total) by mouth daily.  30 tablet  6  . aspirin 81 MG tablet Take 81 mg by mouth daily.        . Coenzyme Q-10 100 MG capsule Take 100 mg by mouth daily.        . CRESTOR 40 MG tablet TAKE ONE TABLET BY MOUTH EVERY DAY  30 tablet  6  . furosemide (LASIX) 20 MG tablet Take 1 tablet (20 mg total) by mouth daily.  30 tablet  6  . metoprolol tartrate (LOPRESSOR) 25 MG tablet TAKE ONE TABLET BY MOUTH TWICE DAILY  60 tablet  11  . nitroGLYCERIN (NITROSTAT) 0.4 MG SL tablet Place 1 tablet (0.4 mg total) under the tongue every 5 (five) minutes as needed.  25 tablet  3  . polyethylene glycol powder (MIRALAX) powder Take 17 g by mouth daily. Take as needed.       . zolpidem (AMBIEN) 5 MG tablet Take 5 mg by mouth at bedtime as needed.         Allergies  Allergen Reactions  . Fish Oil     Break out in scales    Past Medical History  Diagnosis Date  . CAD (coronary artery disease)     a. 07/2008 Inf STEMI->RCA BMS;  b. 09/2008 CABGx2: LIMA->LAD, RIMA->OM as Y from LIMA;  b. 06/2011 MV EF 31%, bsaal to dist ant infarct, inf/inflat infarct with mild to mod peri-infarct ischemia [facility]).  . Ischemic cardiomyopathy     a. 06/2011 Echo: EF 40%, inf/inflat AK and lat HK, aortic sclerosis w/ mild AI, Mild to mod MR (prev mod-sev), Gr 1 DD, mild TR, RVSP  . Systolic CHF, chronic     a. 06/2011 EF 40%  . Hypertension   . Hyperlipidemia   . Hiatal hernia     large  . Tobacco abuse   . Leg pain   .  Cerebrovascular disease   . COPD (chronic obstructive pulmonary disease)     presumed   . Arterial disease     peripheral arterial disease with claudication - bilateral SFA disease with ABI's 0.4-0.5 bilaterally   . CKD (chronic kidney disease), stage IV   . Mitral regurgitation     a. prev Mod-Sev, read as mild to mod by echo 06/2011 [facility]).    Past Surgical History  Procedure Date  . Percutaneous coronary intervention   . Laprotomy for bowel obstruction   . Coronary artery bypass graft     aortic thoraic anuerysm resecton 2010.   Marland Kitchen Coronary stent placement   . Cardiac valve surgery     Family History  Problem Relation Age of Onset  . Coronary artery disease Mother     extensive family history; several other family members.  . Coronary artery disease Brother   . Heart disease Mother     CHF  . Leukemia Sister   . Leukemia Sister   . Leukemia Father     History  Social History  . Marital Status: Widowed    Spouse Name: N/A    Number of Children: Y  . Years of Education: N/A   Occupational History  . 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    Social History Main Topics  . Smoking status: Former Smoker -- 1.0 packs/day for 60 years    Types: Cigarettes    Quit date: 07/12/2008  . Smokeless tobacco: Never Used  . Alcohol Use: Not on file  . Drug Use: Not on file  . Sexually Active: Not on file   Other Topics Concern  . Not on file   Social History Narrative   She lives in [place]. She is retired and widowed.     ROS: Please see the HPI.  All other systems reviewed and negative.  PHYSICAL EXAM:  BP 139/80  Pulse 70  Ht 5' 6.5" (1.689 m)  Wt 148 lb (67.132 kg)  BMI 23.53 kg/m2  SpO2 93%  General: Well developed, well nourished, in no acute distress. Head:  Normocephalic and atraumatic. Neck: no JVD.  Bilateral carotid bruits.  Left greater than right.   Lungs: Clear to auscultation and percussion. Heart: Normal S1 and S2.  Early diastolic blow 1/6. With 1/6 SEM.   Pulses: Pulses normal in all 4 extremities. Extremities: No clubbing or cyanosis. No edema. Neurologic: Alert and oriented x 3.  EKG:  NSR.  LBBB or IVCD.    ASSESSMENT AND PLAN:

## 2012-02-03 LAB — CBC WITH DIFFERENTIAL/PLATELET
Basophils Absolute: 0 10*3/uL (ref 0.0–0.1)
Basophils Relative: 0.4 % (ref 0.0–3.0)
Eosinophils Absolute: 0.1 10*3/uL (ref 0.0–0.7)
Lymphocytes Relative: 31 % (ref 12.0–46.0)
MCHC: 33.1 g/dL (ref 30.0–36.0)
MCV: 88.2 fl (ref 78.0–100.0)
Monocytes Absolute: 0.6 10*3/uL (ref 0.1–1.0)
Neutrophils Relative %: 61 % (ref 43.0–77.0)
RDW: 15.8 % — ABNORMAL HIGH (ref 11.5–14.6)

## 2012-02-03 LAB — LIPID PANEL
HDL: 62.1 mg/dL (ref >39.00)
Total CHOL/HDL Ratio: 3
Triglycerides: 115 mg/dL (ref 0.0–149.0)

## 2012-02-03 LAB — HEPATIC FUNCTION PANEL
ALT: 12 U/L (ref 0–35)
AST: 24 U/L (ref 0–37)
Albumin: 4.1 g/dL (ref 3.5–5.2)
Alkaline Phosphatase: 64 U/L (ref 39–117)
Total Bilirubin: 0.8 mg/dL (ref 0.3–1.2)

## 2012-02-08 DIAGNOSIS — I255 Ischemic cardiomyopathy: Secondary | ICD-10-CM | POA: Insufficient documentation

## 2012-02-08 NOTE — Assessment & Plan Note (Signed)
Overall stable.  Has EF 40%, prior inferior MI, treated with BMS, and inferior WMA.  Stable status.

## 2012-02-08 NOTE — Assessment & Plan Note (Signed)
No current symptoms.  Had CABG, and prior treatment of inferior MI.

## 2012-02-08 NOTE — Assessment & Plan Note (Addendum)
Repeat lipid and liver profile.  She is on aggressive secondary prevention therapy per the new guidelines.

## 2012-02-08 NOTE — Assessment & Plan Note (Signed)
Not smoking.  

## 2012-02-08 NOTE — Assessment & Plan Note (Signed)
Followed by Dr Clance 

## 2012-02-08 NOTE — Assessment & Plan Note (Signed)
The patient has reduced LV, some MR, and residual AI after root replacement.  Has had volume overload, but currently stable on diuretics.

## 2012-02-08 NOTE — Assessment & Plan Note (Signed)
Has bilateral disease.  Has had follow up dopplers, but would not go back to [facility].  Just about due for repeat studies.

## 2012-02-08 NOTE — Assessment & Plan Note (Signed)
Relatively good control.

## 2012-02-08 NOTE — Assessment & Plan Note (Signed)
Being followed by neprhology,  Likely more stage 3 disease.

## 2012-02-23 ENCOUNTER — Encounter (INDEPENDENT_AMBULATORY_CARE_PROVIDER_SITE_OTHER): Payer: Medicare Other

## 2012-02-23 DIAGNOSIS — I359 Nonrheumatic aortic valve disorder, unspecified: Secondary | ICD-10-CM

## 2012-02-23 DIAGNOSIS — I6529 Occlusion and stenosis of unspecified carotid artery: Secondary | ICD-10-CM

## 2012-02-23 DIAGNOSIS — I25810 Atherosclerosis of coronary artery bypass graft(s) without angina pectoris: Secondary | ICD-10-CM

## 2012-05-18 ENCOUNTER — Other Ambulatory Visit: Payer: Self-pay | Admitting: *Deleted

## 2012-05-18 MED ORDER — AMLODIPINE BESYLATE 10 MG PO TABS: 10.0000 mg | ORAL_TABLET | Freq: Every day | ORAL | Status: DC

## 2012-05-18 NOTE — Telephone Encounter (Signed)
S/w pt to re-establish a visit with Dr.  Clifton James, former Dr. Riley Kill pt, made appt  for July, also going to fill pts med amlodipine (10mg ) daily

## 2012-07-04 ENCOUNTER — Other Ambulatory Visit: Payer: Self-pay | Admitting: Cardiology

## 2012-07-05 ENCOUNTER — Encounter: Payer: Self-pay | Admitting: Nephrology

## 2012-07-18 ENCOUNTER — Encounter: Payer: Medicare Other | Admitting: Cardiovascular Disease

## 2012-07-18 ENCOUNTER — Ambulatory Visit (INDEPENDENT_AMBULATORY_CARE_PROVIDER_SITE_OTHER): Payer: Medicare Other | Admitting: Cardiovascular Disease

## 2012-07-18 ENCOUNTER — Encounter: Payer: Self-pay | Admitting: Cardiovascular Disease

## 2012-07-18 VITALS — BP 126/68 | HR 72 | Ht 66.0 in | Wt 147.4 lb

## 2012-07-18 DIAGNOSIS — I340 Nonrheumatic mitral (valve) insufficiency: Secondary | ICD-10-CM

## 2012-07-18 DIAGNOSIS — I10 Essential (primary) hypertension: Secondary | ICD-10-CM

## 2012-07-18 DIAGNOSIS — I779 Disorder of arteries and arterioles, unspecified: Secondary | ICD-10-CM

## 2012-07-18 DIAGNOSIS — I2589 Other forms of chronic ischemic heart disease: Secondary | ICD-10-CM

## 2012-07-18 DIAGNOSIS — I059 Rheumatic mitral valve disease, unspecified: Secondary | ICD-10-CM

## 2012-07-18 DIAGNOSIS — I255 Ischemic cardiomyopathy: Secondary | ICD-10-CM

## 2012-07-18 DIAGNOSIS — I2510 Atherosclerotic heart disease of native coronary artery without angina pectoris: Secondary | ICD-10-CM

## 2012-07-18 MED ORDER — PRAVASTATIN SODIUM 40 MG PO TABS: 40.0000 mg | ORAL_TABLET | Freq: Every evening | ORAL | Status: DC

## 2012-07-18 NOTE — Progress Notes (Signed)
History of Present Illness: 79 yo female with history of CAD, ischemic cardiomyopathy, chronic systolic CHF, HTN, HLD, tobacco abuse, COPD, CKD, PAD, mitral regurgitation who is here today for cardiac follow up. She has been followed in the past by Dr. Riley Kill. She had an inferior MI in July 2010 treated with a bare metal stent in the RCA. CABG September 2010 with LIMA to LAD and free RIMA Y graft to OM off of LIMA. She also had aortic root replacement at that time secondary to dilated aortic root. She is known to have severe PAD. Last carotid artery dopplers 02/29/12 with moderate 60-79% bilateral stenosis. She is followed in Nephrology by Dr. Arrie Aran and her renal function is stable. Last echo June 2013 in [place] with LVEF 40%, moderate MR.   She has been doing well. No chest pains or SOB. Weight is stable. She no longer smokes.   Primary Care Physician:  Windle Guard  Last Lipid Profile:Lipid Panel     Component Value Date/Time   CHOL 165 02/02/2012 1637   TRIG 115.0 02/02/2012 1637   HDL 62.10 02/02/2012 1637   CHOLHDL 3 02/02/2012 1637   VLDL 23.0 02/02/2012 1637   LDLCALC 80 02/02/2012 1637     Past Medical History  Diagnosis Date  . CAD (coronary artery disease)     a. 07/2008 Inf STEMI->RCA BMS;  b. 09/2008 CABGx2: LIMA->LAD, RIMA->OM as Y from LIMA;  b. 06/2011 MV EF 31%, bsaal to dist ant infarct, inf/inflat infarct with mild to mod peri-infarct ischemia [facility]).  . Ischemic cardiomyopathy     a. 06/2011 Echo: EF 40%, inf/inflat AK and lat HK, aortic sclerosis w/ mild AI, Mild to mod MR (prev mod-sev), Gr 1 DD, mild TR, RVSP  . Systolic CHF, chronic     a. 06/2011 EF 40%  . Hypertension   . Hyperlipidemia   . Hiatal hernia     large  . Tobacco abuse   . Leg pain   . Cerebrovascular disease   . COPD (chronic obstructive pulmonary disease)     presumed   . Arterial disease     peripheral arterial disease with claudication - bilateral SFA disease with ABI's  0.4-0.5 bilaterally   . CKD (chronic kidney disease), stage IV   . Mitral regurgitation     a. prev Mod-Sev, read as mild to mod by echo 06/2011 [facility]).    Past Surgical History  Procedure Laterality Date  . Percutaneous coronary intervention    . Laprotomy for bowel obstruction    . Coronary artery bypass graft      aortic thoraic anuerysm resecton 2010.   Marland Kitchen Coronary stent placement    . Cardiac valve surgery      Current Outpatient Prescriptions  Medication Sig Dispense Refill  . amLODipine (NORVASC) 10 MG tablet Take 1 tablet (10 mg total) by mouth daily.  30 tablet  6  . aspirin 81 MG tablet Take 81 mg by mouth daily.        . Coenzyme Q-10 100 MG capsule Take 100 mg by mouth daily.        . furosemide (LASIX) 20 MG tablet TAKE ONE TABLET BY MOUTH EVERY DAY  30 tablet  0  . metoprolol tartrate (LOPRESSOR) 25 MG tablet TAKE ONE TABLET BY MOUTH TWICE DAILY  60 tablet  11  . nitroGLYCERIN (NITROSTAT) 0.4 MG SL tablet Place 1 tablet (0.4 mg total) under the tongue every 5 (five) minutes as  needed.  25 tablet  3  . polyethylene glycol powder (MIRALAX) powder Take 17 g by mouth daily. Take as needed.       . zolpidem (AMBIEN) 5 MG tablet Take 5 mg by mouth at bedtime as needed.       . CRESTOR 40 MG tablet TAKE ONE TABLET BY MOUTH EVERY DAY  30 tablet  6   No current facility-administered medications for this visit.    Allergies  Allergen Reactions  . Fish Oil     Break out in scales    History   Social History  . Marital Status: Widowed    Spouse Name: N/A    Number of Children: Y  . Years of Education: N/A   Occupational History  . 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    Social History Main Topics  . Smoking status: Former Smoker -- 1.00 packs/day for 60 years    Types: Cigarettes    Quit date: 07/12/2008  . Smokeless tobacco: Never Used  . Alcohol Use: Not on file  . Drug Use: Not on file  . Sexually Active: Not on file   Other Topics Concern   . Not on file   Social History Narrative   She lives in [place]. She is retired and widowed.     Family History  Problem Relation Age of Onset  . Coronary artery disease Mother     extensive family history; several other family members.  . Coronary artery disease Brother   . Heart disease Mother     CHF  . Leukemia Sister   . Leukemia Sister   . Leukemia Father     Review of Systems:  As stated in the HPI and otherwise negative.   BP 126/68  Pulse 72  Ht 5\' 6"  (1.676 m)  Wt 147 lb 6.4 oz (66.86 kg)  BMI 23.8 kg/m2  Physical Examination: General: Well developed, well nourished, NAD HEENT: OP clear, mucus membranes moist SKIN: warm, dry. No rashes. Neuro: No focal deficits Musculoskeletal: Muscle strength 5/5 all ext Psychiatric: Mood and affect normal Neck: No JVD, no carotid bruits, no thyromegaly, no lymphadenopathy. Lungs:Clear bilaterally, no wheezes, rhonci, crackles Cardiovascular: Regular rate and rhythm. Systolic murmur. No gallops or rubs. Abdomen:Soft. Bowel sounds present. Non-tender.  Extremities: No lower extremity edema. Pulses are 2 + in the bilateral DP/PT.  Assessment and Plan:   1. CAROTID ARTERY STENOSIS: She has bilateral disease. Stable by dopplers February 2014.  Does not wish to f/u in [facility].    2. CKD:  Being followed by nephrology. Refuses to consider dialysis.   3. CAD: Stable. No chest pains. No changes today.   4. Tobacco abuse: Not smoking.   5. HYPERCHOLESTEROLEMIA: She has been off of statin. Worried that Crestor was leading to scalp issues. She was seen in Dermatology and this was not felt to be the cause. Due to cost, will start Pravastatin 40 mg po QHS. Repeat lipids and LFTs in 12 weeks.      6. HYPERTENSION:  Good control.      7. MITRAL REGURGITATION: Moderate by echo at [facility] June 2013 (scanned into EPIC).     8. Cardiomyopathy, ischemic: Overall stable.  Last LVEF 40% by echo June 2013 at [facility].

## 2012-07-18 NOTE — Patient Instructions (Addendum)
Your physician wants you to follow-up in:  6 months.  You will receive a reminder letter in the mail two months in advance. If you don't receive a letter, please call our office to schedule the follow-up appointment.  Your physician has recommended you make the following change in your medication:  Stop Crestor. Start Pravastatin 40 mg by mouth daily.  Your physician recommends that you return for fasting lab work in:  12 weeks.  Last week of September.  Lipid and Liver profiles. The lab opens at 7:30 AM daily

## 2012-08-02 ENCOUNTER — Other Ambulatory Visit: Payer: Self-pay | Admitting: Cardiovascular Disease

## 2012-08-08 ENCOUNTER — Other Ambulatory Visit: Payer: Self-pay | Admitting: Cardiology

## 2012-08-31 ENCOUNTER — Encounter (INDEPENDENT_AMBULATORY_CARE_PROVIDER_SITE_OTHER): Payer: Medicare Other

## 2012-08-31 DIAGNOSIS — I6529 Occlusion and stenosis of unspecified carotid artery: Secondary | ICD-10-CM

## 2012-09-01 ENCOUNTER — Other Ambulatory Visit: Payer: Self-pay | Admitting: Cardiovascular Disease

## 2012-09-14 ENCOUNTER — Other Ambulatory Visit: Payer: Self-pay | Admitting: Family Medicine

## 2012-09-14 DIAGNOSIS — E041 Nontoxic single thyroid nodule: Secondary | ICD-10-CM

## 2012-09-15 ENCOUNTER — Ambulatory Visit
Admission: RE | Admit: 2012-09-15 | Discharge: 2012-09-15 | Disposition: A | Discharge status: Home / Self Care | Payer: Medicare Other | Source: Ambulatory Visit | Attending: Family Medicine | Admitting: Family Medicine

## 2012-09-15 DIAGNOSIS — E041 Nontoxic single thyroid nodule: Secondary | ICD-10-CM

## 2012-09-19 ENCOUNTER — Other Ambulatory Visit: Payer: Self-pay | Admitting: Family Medicine

## 2012-09-19 DIAGNOSIS — E041 Nontoxic single thyroid nodule: Secondary | ICD-10-CM

## 2012-10-10 ENCOUNTER — Other Ambulatory Visit (INDEPENDENT_AMBULATORY_CARE_PROVIDER_SITE_OTHER): Payer: Medicare Other

## 2012-10-10 DIAGNOSIS — I2510 Atherosclerotic heart disease of native coronary artery without angina pectoris: Secondary | ICD-10-CM

## 2012-10-10 LAB — LDL CHOLESTEROL, DIRECT: Direct LDL: 152.4 mg/dL

## 2012-10-10 LAB — HEPATIC FUNCTION PANEL
ALT: 8 U/L (ref 0–35)
Albumin: 3.9 g/dL (ref 3.5–5.2)
Alkaline Phosphatase: 50 U/L (ref 39–117)
Bilirubin, Direct: 0 mg/dL (ref 0.0–0.3)

## 2012-10-10 LAB — LIPID PANEL
Cholesterol: 235 mg/dL — ABNORMAL HIGH (ref 0–200)
HDL: 55.6 mg/dL (ref >39.00)
Total CHOL/HDL Ratio: 4
VLDL: 30.8 mg/dL (ref 0.0–40.0)

## 2012-10-12 ENCOUNTER — Telehealth: Payer: Self-pay | Admitting: Cardiovascular Disease

## 2012-10-12 DIAGNOSIS — I2510 Atherosclerotic heart disease of native coronary artery without angina pectoris: Secondary | ICD-10-CM

## 2012-10-12 DIAGNOSIS — E041 Nontoxic single thyroid nodule: Secondary | ICD-10-CM

## 2012-10-12 HISTORY — DX: Nontoxic single thyroid nodule: E04.1

## 2012-10-12 MED ORDER — PRAVASTATIN SODIUM 80 MG PO TABS: 80.0000 mg | ORAL_TABLET | Freq: Every evening | ORAL | Status: DC

## 2012-10-12 NOTE — Telephone Encounter (Signed)
Spoke with pt and reviewed lab results with her and instructions from Dr. Clifton James to increase Pravastatin to 80 mg by mouth daily. Will send prescription to [facility] on [address]. She will come in for fasting lab work the week of December 26, 2012. She also states she has been having headaches recently and thinks she may need an MRI. I have asked her to contact primary care for this.

## 2012-10-12 NOTE — Telephone Encounter (Signed)
New problem:  Pt states she would like the results of her last blood work

## 2012-10-13 ENCOUNTER — Ambulatory Visit
Admission: RE | Admit: 2012-10-13 | Discharge: 2012-10-13 | Disposition: A | Discharge status: Home / Self Care | Payer: Medicare Other | Source: Ambulatory Visit | Attending: Family Medicine | Admitting: Family Medicine

## 2012-10-13 ENCOUNTER — Other Ambulatory Visit (HOSPITAL_COMMUNITY)
Admission: RE | Admit: 2012-10-13 | Discharge: 2012-10-13 | Disposition: A | Discharge status: Home / Self Care | Payer: Medicare Other | Source: Ambulatory Visit | Attending: Interventional Radiology | Admitting: Interventional Radiology

## 2012-10-13 DIAGNOSIS — E041 Nontoxic single thyroid nodule: Secondary | ICD-10-CM

## 2012-11-16 ENCOUNTER — Ambulatory Visit: Payer: Medicare Other | Admitting: Pulmonary Disease

## 2012-12-24 ENCOUNTER — Other Ambulatory Visit: Payer: Self-pay | Admitting: Cardiology

## 2012-12-26 ENCOUNTER — Other Ambulatory Visit (INDEPENDENT_AMBULATORY_CARE_PROVIDER_SITE_OTHER): Payer: Medicare Other

## 2012-12-26 DIAGNOSIS — I2510 Atherosclerotic heart disease of native coronary artery without angina pectoris: Secondary | ICD-10-CM

## 2012-12-26 LAB — HEPATIC FUNCTION PANEL
AST: 20 U/L (ref 0–37)
Albumin: 3.9 g/dL (ref 3.5–5.2)
Alkaline Phosphatase: 56 U/L (ref 39–117)
Bilirubin, Direct: 0 mg/dL (ref 0.0–0.3)
Total Bilirubin: 0.6 mg/dL (ref 0.3–1.2)

## 2012-12-26 LAB — LIPID PANEL
Cholesterol: 205 mg/dL — ABNORMAL HIGH (ref 0–200)
Total CHOL/HDL Ratio: 5
VLDL: 31.6 mg/dL (ref 0.0–40.0)

## 2013-01-20 ENCOUNTER — Ambulatory Visit (INDEPENDENT_AMBULATORY_CARE_PROVIDER_SITE_OTHER): Payer: Medicare Other | Admitting: Cardiovascular Disease

## 2013-01-20 ENCOUNTER — Encounter: Payer: Self-pay | Admitting: Cardiovascular Disease

## 2013-01-20 VITALS — BP 140/76 | HR 72 | Ht 66.0 in | Wt 143.0 lb

## 2013-01-20 DIAGNOSIS — N184 Chronic kidney disease, stage 4 (severe): Secondary | ICD-10-CM

## 2013-01-20 DIAGNOSIS — I779 Disorder of arteries and arterioles, unspecified: Secondary | ICD-10-CM

## 2013-01-20 DIAGNOSIS — I255 Ischemic cardiomyopathy: Secondary | ICD-10-CM

## 2013-01-20 DIAGNOSIS — I10 Essential (primary) hypertension: Secondary | ICD-10-CM

## 2013-01-20 DIAGNOSIS — Z87891 Personal history of nicotine dependence: Secondary | ICD-10-CM

## 2013-01-20 DIAGNOSIS — I059 Rheumatic mitral valve disease, unspecified: Secondary | ICD-10-CM

## 2013-01-20 DIAGNOSIS — E785 Hyperlipidemia, unspecified: Secondary | ICD-10-CM

## 2013-01-20 DIAGNOSIS — F17201 Nicotine dependence, unspecified, in remission: Secondary | ICD-10-CM

## 2013-01-20 DIAGNOSIS — I2510 Atherosclerotic heart disease of native coronary artery without angina pectoris: Secondary | ICD-10-CM

## 2013-01-20 DIAGNOSIS — I2589 Other forms of chronic ischemic heart disease: Secondary | ICD-10-CM

## 2013-01-20 DIAGNOSIS — I739 Peripheral vascular disease, unspecified: Secondary | ICD-10-CM

## 2013-01-20 DIAGNOSIS — I340 Nonrheumatic mitral (valve) insufficiency: Secondary | ICD-10-CM

## 2013-01-20 NOTE — Progress Notes (Signed)
History of Present Illness: 80 yo female with history of CAD, ischemic cardiomyopathy, chronic systolic CHF, HTN, HLD, tobacco abuse, COPD, CKD, PAD, mitral regurgitation who is here today for cardiac follow up. She has been followed in the past by Dr. Lia Foyer. She had an inferior MI in July 2010 treated with a bare metal stent in the RCA. CABG September 2010 with LIMA to LAD and free RIMA Y graft to OM off of LIMA. She also had aortic root replacement at that time secondary to dilated aortic root. She is known to have severe PAD. Last carotid artery dopplers 09/04/12 with moderate 60-79% bilateral stenosis. She is followed in Nephrology by Dr. Marval Regal and her renal function is stable. Last echo June 2013 in [place] with LVEF 40%, moderate MR.   She has been doing well. One episode of chest burning last week. Resolved with NTG. No SOB. Weight is stable. She no longer smokes.   Primary Care Physician:  Claris Gower  Last Lipid Profile:Lipid Panel     Component Value Date/Time   CHOL 205* 12/26/2012 0943   TRIG 158.0* 12/26/2012 0943   HDL 45.10 12/26/2012 0943   CHOLHDL 5 12/26/2012 0943   VLDL 31.6 12/26/2012 0943   LDLCALC 80 02/02/2012 1637     Past Medical History  Diagnosis Date  . CAD (coronary artery disease)     a. 07/2008 Inf STEMI->RCA BMS;  b. 09/2008 CABGx2: LIMA->LAD, RIMA->OM as Y from LIMA;  b. 06/2011 MV EF 31%, bsaal to dist ant infarct, inf/inflat infarct with mild to mod peri-infarct ischemia [facility]).  . Ischemic cardiomyopathy     a. 06/2011 Echo: EF 40%, inf/inflat AK and lat HK, aortic sclerosis w/ mild AI, Mild to mod MR (prev mod-sev), Gr 1 DD, mild TR, RVSP 60mmHg  . Systolic CHF, chronic     a. 06/2011 EF 40%  . Hypertension   . Hyperlipidemia   . Hiatal hernia     large  . Tobacco abuse   . Leg pain   . Cerebrovascular disease   . COPD (chronic obstructive pulmonary disease)     presumed   . Arterial disease     peripheral arterial disease  with claudication - bilateral SFA disease with ABI's 0.4-0.5 bilaterally   . CKD (chronic kidney disease), stage IV   . Mitral regurgitation     a. prev Mod-Sev, read as mild to mod by echo 06/2011 [facility]).    Past Surgical History  Procedure Laterality Date  . Percutaneous coronary intervention    . Laprotomy for bowel obstruction    . Coronary artery bypass graft      aortic thoraic anuerysm resecton 2010.   Marland Kitchen Coronary stent placement    . Cardiac valve surgery      Current Outpatient Prescriptions  Medication Sig Dispense Refill  . amLODipine (NORVASC) 10 MG tablet TAKE ONE TABLET BY MOUTH ONCE DAILY  30 tablet  0  . aspirin 81 MG tablet Take 81 mg by mouth daily.        . Coenzyme Q-10 100 MG capsule Take 100 mg by mouth daily.        . furosemide (LASIX) 20 MG tablet TAKE ONE TABLET BY MOUTH ONCE DAILY  30 tablet  6  . metoprolol tartrate (LOPRESSOR) 25 MG tablet TAKE ONE TABLET BY MOUTH TWICE DAILY  60 tablet  6  . nitroGLYCERIN (NITROSTAT) 0.4 MG SL tablet Place 1 tablet (0.4 mg total) under the  tongue every 5 (five) minutes as needed.  25 tablet  3  . polyethylene glycol powder (MIRALAX) powder Take 17 g by mouth daily. Take as needed.       . pravastatin (PRAVACHOL) 80 MG tablet Take 1 tablet (80 mg total) by mouth every evening.  30 tablet  6  . zolpidem (AMBIEN) 5 MG tablet Take 5 mg by mouth at bedtime as needed.        No current facility-administered medications for this visit.    Allergies  Allergen Reactions  . Fish Oil     Break out in scales    History   Social History  . Marital Status: Widowed    Spouse Name: N/A    Number of Children: Y  . Years of Education: N/A   Occupational History  . 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    Social History Main Topics  . Smoking status: Former Smoker -- 1.00 packs/day for 60 years    Types: Cigarettes    Quit date: 07/12/2008  . Smokeless tobacco: Never Used  . Alcohol Use: Not on file  .  Drug Use: Not on file  . Sexual Activity: Not on file   Other Topics Concern  . Not on file   Social History Narrative   She lives in [place]. She is retired and widowed.     Family History  Problem Relation Age of Onset  . Coronary artery disease Mother     extensive family history; several other family members.  . Coronary artery disease Brother   . Heart disease Mother     CHF  . Leukemia Sister   . Leukemia Sister   . Leukemia Father     Review of Systems:  As stated in the HPI and otherwise negative.   BP 140/76  Pulse 72  Ht 5\' 6"  (1.676 m)  Wt 143 lb (64.864 kg)  BMI 23.09 kg/m2  Physical Examination: General: Well developed, well nourished, NAD HEENT: OP clear, mucus membranes moist SKIN: warm, dry. No rashes. Neuro: No focal deficits Musculoskeletal: Muscle strength 5/5 all ext Psychiatric: Mood and affect normal Neck: No JVD, no carotid bruits, no thyromegaly, no lymphadenopathy. Lungs:Clear bilaterally, no wheezes, rhonci, crackles Cardiovascular: Regular rate and rhythm. Systolic murmur. No gallops or rubs. Abdomen:Soft. Bowel sounds present. Non-tender.  Extremities: No lower extremity edema. Pulses are 2 + in the bilateral DP/PT.  EKG: sinus, rate 72 bpm. PVC. LVH  Assessment and Plan:   1. CAROTID ARTERY STENOSIS: She has bilateral disease. Stable by dopplers August 2014. Repeat August 2015. Does not wish to f/u in [facility].     2. CKD:  Being followed by nephrology. Refuses to consider dialysis. Last creatinine stable 12/14/12  3. CAD: Stable angina. No change in frequency of chest pains. No changes today. Continue current therapy.   4. Tobacco abuse: Not smoking.   5. HYPERCHOLESTEROLEMIA: She had been off of statins and was worried that Crestor was leading to her scalp issues. She was seen in Dermatology and this was not felt to be the cause. She is tolerating Pravastatin.     6. HYPERTENSION:  Good control.  No changes today  7. MITRAL  REGURGITATION: Moderate by echo at [facility] June 2013 (scanned into EPIC).     8. Cardiomyopathy, ischemic: Overall stable.  Last LVEF 40% by echo June 2013 at [facility].

## 2013-01-20 NOTE — Patient Instructions (Signed)
Your physician wants you to follow-up in:  6 months. You will receive a reminder letter in the mail two months in advance. If you don't receive a letter, please call our office to schedule the follow-up appointment.   

## 2013-01-23 ENCOUNTER — Other Ambulatory Visit: Payer: Self-pay | Admitting: Cardiovascular Disease

## 2013-02-21 ENCOUNTER — Other Ambulatory Visit: Payer: Self-pay | Admitting: Cardiovascular Disease

## 2013-03-21 ENCOUNTER — Other Ambulatory Visit: Payer: Self-pay | Admitting: Cardiovascular Disease

## 2013-05-07 ENCOUNTER — Inpatient Hospital Stay (HOSPITAL_COMMUNITY): Payer: Medicare Other

## 2013-05-07 ENCOUNTER — Observation Stay (HOSPITAL_COMMUNITY)
Admission: EM | Admit: 2013-05-07 | Discharge: 2013-05-09 | Disposition: A | Discharge status: Home / Self Care | Payer: Medicare Other | Attending: Internal Medicine | Admitting: Internal Medicine

## 2013-05-07 ENCOUNTER — Encounter (HOSPITAL_COMMUNITY): Payer: Self-pay | Admitting: Emergency Medicine

## 2013-05-07 DIAGNOSIS — K626 Ulcer of anus and rectum: Principal | ICD-10-CM | POA: Clinically undetermined

## 2013-05-07 DIAGNOSIS — N184 Chronic kidney disease, stage 4 (severe): Secondary | ICD-10-CM | POA: Diagnosis present

## 2013-05-07 DIAGNOSIS — D599 Acquired hemolytic anemia, unspecified: Secondary | ICD-10-CM

## 2013-05-07 DIAGNOSIS — I129 Hypertensive chronic kidney disease with stage 1 through stage 4 chronic kidney disease, or unspecified chronic kidney disease: Secondary | ICD-10-CM | POA: Insufficient documentation

## 2013-05-07 DIAGNOSIS — E7800 Pure hypercholesterolemia, unspecified: Secondary | ICD-10-CM

## 2013-05-07 DIAGNOSIS — I5022 Chronic systolic (congestive) heart failure: Secondary | ICD-10-CM

## 2013-05-07 DIAGNOSIS — I359 Nonrheumatic aortic valve disorder, unspecified: Secondary | ICD-10-CM

## 2013-05-07 DIAGNOSIS — I2510 Atherosclerotic heart disease of native coronary artery without angina pectoris: Secondary | ICD-10-CM

## 2013-05-07 DIAGNOSIS — K644 Residual hemorrhoidal skin tags: Secondary | ICD-10-CM | POA: Insufficient documentation

## 2013-05-07 DIAGNOSIS — E785 Hyperlipidemia, unspecified: Secondary | ICD-10-CM | POA: Insufficient documentation

## 2013-05-07 DIAGNOSIS — K621 Rectal polyp: Secondary | ICD-10-CM

## 2013-05-07 DIAGNOSIS — K922 Gastrointestinal hemorrhage, unspecified: Secondary | ICD-10-CM

## 2013-05-07 DIAGNOSIS — J4489 Other specified chronic obstructive pulmonary disease: Secondary | ICD-10-CM

## 2013-05-07 DIAGNOSIS — M79609 Pain in unspecified limb: Secondary | ICD-10-CM

## 2013-05-07 DIAGNOSIS — F17200 Nicotine dependence, unspecified, uncomplicated: Secondary | ICD-10-CM

## 2013-05-07 DIAGNOSIS — Z66 Do not resuscitate: Secondary | ICD-10-CM | POA: Insufficient documentation

## 2013-05-07 DIAGNOSIS — I059 Rheumatic mitral valve disease, unspecified: Secondary | ICD-10-CM | POA: Insufficient documentation

## 2013-05-07 DIAGNOSIS — K449 Diaphragmatic hernia without obstruction or gangrene: Secondary | ICD-10-CM

## 2013-05-07 DIAGNOSIS — I509 Heart failure, unspecified: Secondary | ICD-10-CM | POA: Insufficient documentation

## 2013-05-07 DIAGNOSIS — I70219 Atherosclerosis of native arteries of extremities with intermittent claudication, unspecified extremity: Secondary | ICD-10-CM

## 2013-05-07 DIAGNOSIS — Z9861 Coronary angioplasty status: Secondary | ICD-10-CM | POA: Insufficient documentation

## 2013-05-07 DIAGNOSIS — I255 Ischemic cardiomyopathy: Secondary | ICD-10-CM

## 2013-05-07 DIAGNOSIS — N259 Disorder resulting from impaired renal tubular function, unspecified: Secondary | ICD-10-CM

## 2013-05-07 DIAGNOSIS — I252 Old myocardial infarction: Secondary | ICD-10-CM | POA: Insufficient documentation

## 2013-05-07 DIAGNOSIS — R0989 Other specified symptoms and signs involving the circulatory and respiratory systems: Secondary | ICD-10-CM

## 2013-05-07 DIAGNOSIS — R918 Other nonspecific abnormal finding of lung field: Secondary | ICD-10-CM

## 2013-05-07 DIAGNOSIS — J479 Bronchiectasis, uncomplicated: Secondary | ICD-10-CM

## 2013-05-07 DIAGNOSIS — J90 Pleural effusion, not elsewhere classified: Secondary | ICD-10-CM

## 2013-05-07 DIAGNOSIS — I10 Essential (primary) hypertension: Secondary | ICD-10-CM | POA: Diagnosis present

## 2013-05-07 DIAGNOSIS — J449 Chronic obstructive pulmonary disease, unspecified: Secondary | ICD-10-CM | POA: Diagnosis present

## 2013-05-07 DIAGNOSIS — N183 Chronic kidney disease, stage 3 unspecified: Secondary | ICD-10-CM

## 2013-05-07 DIAGNOSIS — I080 Rheumatic disorders of both mitral and aortic valves: Secondary | ICD-10-CM

## 2013-05-07 DIAGNOSIS — Z87891 Personal history of nicotine dependence: Secondary | ICD-10-CM | POA: Insufficient documentation

## 2013-05-07 DIAGNOSIS — Z860100 Personal history of colon polyps, unspecified: Secondary | ICD-10-CM | POA: Diagnosis present

## 2013-05-07 DIAGNOSIS — Z951 Presence of aortocoronary bypass graft: Secondary | ICD-10-CM | POA: Insufficient documentation

## 2013-05-07 DIAGNOSIS — I5042 Chronic combined systolic (congestive) and diastolic (congestive) heart failure: Secondary | ICD-10-CM | POA: Diagnosis present

## 2013-05-07 DIAGNOSIS — K648 Other hemorrhoids: Secondary | ICD-10-CM

## 2013-05-07 DIAGNOSIS — D72829 Elevated white blood cell count, unspecified: Secondary | ICD-10-CM | POA: Insufficient documentation

## 2013-05-07 DIAGNOSIS — D126 Benign neoplasm of colon, unspecified: Secondary | ICD-10-CM

## 2013-05-07 DIAGNOSIS — I2589 Other forms of chronic ischemic heart disease: Secondary | ICD-10-CM

## 2013-05-07 DIAGNOSIS — I6529 Occlusion and stenosis of unspecified carotid artery: Secondary | ICD-10-CM

## 2013-05-07 DIAGNOSIS — I25810 Atherosclerosis of coronary artery bypass graft(s) without angina pectoris: Secondary | ICD-10-CM

## 2013-05-07 DIAGNOSIS — E041 Nontoxic single thyroid nodule: Secondary | ICD-10-CM

## 2013-05-07 DIAGNOSIS — R079 Chest pain, unspecified: Secondary | ICD-10-CM

## 2013-05-07 DIAGNOSIS — Z7982 Long term (current) use of aspirin: Secondary | ICD-10-CM | POA: Insufficient documentation

## 2013-05-07 DIAGNOSIS — K5730 Diverticulosis of large intestine without perforation or abscess without bleeding: Secondary | ICD-10-CM | POA: Insufficient documentation

## 2013-05-07 HISTORY — DX: Nontoxic single thyroid nodule: E04.1

## 2013-05-07 LAB — COMPREHENSIVE METABOLIC PANEL
ALBUMIN: 3.6 g/dL (ref 3.5–5.2)
ALT: 7 U/L (ref 0–35)
AST: 18 U/L (ref 0–37)
Alkaline Phosphatase: 51 U/L (ref 39–117)
BUN: 29 mg/dL — ABNORMAL HIGH (ref 6–23)
CALCIUM: 9.5 mg/dL (ref 8.4–10.5)
CO2: 25 meq/L (ref 19–32)
Chloride: 102 mEq/L (ref 96–112)
Creatinine, Ser: 1.72 mg/dL — ABNORMAL HIGH (ref 0.50–1.10)
GFR calc Af Amer: 30 mL/min — ABNORMAL LOW (ref >90)
GFR, EST NON AFRICAN AMERICAN: 26 mL/min — AB (ref >90)
Glucose, Bld: 88 mg/dL (ref 70–99)
Potassium: 3.9 mEq/L (ref 3.7–5.3)
Sodium: 142 mEq/L (ref 137–147)
Total Bilirubin: 0.5 mg/dL (ref 0.3–1.2)
Total Protein: 6.8 g/dL (ref 6.0–8.3)

## 2013-05-07 LAB — CBC WITH DIFFERENTIAL/PLATELET
BASOS PCT: 0 % (ref 0–1)
Basophils Absolute: 0 10*3/uL (ref 0.0–0.1)
Eosinophils Absolute: 0.1 10*3/uL (ref 0.0–0.7)
Eosinophils Relative: 2 % (ref 0–5)
HCT: 44.3 % (ref 36.0–46.0)
Hemoglobin: 14.9 g/dL (ref 12.0–15.0)
LYMPHS PCT: 18 % (ref 12–46)
Lymphs Abs: 1.2 10*3/uL (ref 0.7–4.0)
MCH: 30 pg (ref 26.0–34.0)
MCHC: 33.6 g/dL (ref 30.0–36.0)
MCV: 89.3 fL (ref 78.0–100.0)
Monocytes Absolute: 0.6 10*3/uL (ref 0.1–1.0)
Monocytes Relative: 9 % (ref 3–12)
NEUTROS ABS: 4.4 10*3/uL (ref 1.7–7.7)
Neutrophils Relative %: 71 % (ref 43–77)
Platelets: 430 10*3/uL — ABNORMAL HIGH (ref 150–400)
RBC: 4.96 MIL/uL (ref 3.87–5.11)
RDW: 14.9 % (ref 11.5–15.5)
WBC: 6.3 10*3/uL (ref 4.0–10.5)

## 2013-05-07 LAB — HEMOGLOBIN AND HEMATOCRIT, BLOOD
HCT: 44.4 % (ref 36.0–46.0)
HCT: 43.6 % (ref 36.0–46.0)
HEMATOCRIT: 42.5 % (ref 36.0–46.0)
HEMOGLOBIN: 14.7 g/dL (ref 12.0–15.0)
Hemoglobin: 14 g/dL (ref 12.0–15.0)
Hemoglobin: 14.6 g/dL (ref 12.0–15.0)

## 2013-05-07 LAB — TYPE AND SCREEN
ABO/RH(D): B POS
Antibody Screen: NEGATIVE

## 2013-05-07 MED ORDER — POLYVINYL ALCOHOL 1.4 % OP SOLN
1.0000 [drp] | OPHTHALMIC | Status: DC | PRN
  Filled 2013-05-07: qty 15

## 2013-05-07 MED ORDER — POLYETHYLENE GLYCOL 3350 17 G PO PACK
17.0000 g | PACK | Freq: Every day | ORAL | Status: DC | PRN
  Filled 2013-05-07: qty 1

## 2013-05-07 MED ORDER — SODIUM CHLORIDE 0.9 % IV SOLN
80.0000 mg | Freq: Once | INTRAVENOUS | Status: AC
  Administered 2013-05-07: 80 mg via INTRAVENOUS
  Filled 2013-05-07: qty 80

## 2013-05-07 MED ORDER — HYDROCODONE-ACETAMINOPHEN 5-325 MG PO TABS: 1.0000 | ORAL_TABLET | ORAL | Status: DC | PRN

## 2013-05-07 MED ORDER — SIMVASTATIN 5 MG PO TABS
5.0000 mg | ORAL_TABLET | Freq: Every day | ORAL | Status: DC
  Administered 2013-05-07 – 2013-05-08 (×2): 5 mg via ORAL
  Filled 2013-05-07 (×3): qty 1

## 2013-05-07 MED ORDER — NITROGLYCERIN 0.4 MG SL SUBL: 0.4000 mg | SUBLINGUAL_TABLET | SUBLINGUAL | Status: DC | PRN

## 2013-05-07 MED ORDER — SODIUM CHLORIDE 0.9 % IV SOLN
8.0000 mg/h | INTRAVENOUS | Status: DC
  Administered 2013-05-07 – 2013-05-08 (×3): 8 mg/h via INTRAVENOUS
  Filled 2013-05-07 (×6): qty 80

## 2013-05-07 MED ORDER — METOPROLOL TARTRATE 25 MG PO TABS
25.0000 mg | ORAL_TABLET | Freq: Two times a day (BID) | ORAL | Status: DC
  Administered 2013-05-07 – 2013-05-09 (×5): 25 mg via ORAL
  Filled 2013-05-07 (×6): qty 1

## 2013-05-07 MED ORDER — SODIUM CHLORIDE 0.9 % IJ SOLN
3.0000 mL | Freq: Two times a day (BID) | INTRAMUSCULAR | Status: DC
  Administered 2013-05-07 – 2013-05-09 (×4): 3 mL via INTRAVENOUS

## 2013-05-07 MED ORDER — PANTOPRAZOLE SODIUM 40 MG IV SOLR: 40.0000 mg | Freq: Two times a day (BID) | INTRAVENOUS | Status: DC

## 2013-05-07 MED ORDER — AMLODIPINE BESYLATE 10 MG PO TABS
10.0000 mg | ORAL_TABLET | Freq: Every day | ORAL | Status: DC
  Administered 2013-05-07 – 2013-05-09 (×3): 10 mg via ORAL
  Filled 2013-05-07 (×3): qty 1

## 2013-05-07 MED ORDER — GUAIFENESIN-DM 100-10 MG/5ML PO SYRP: 5.0000 mL | ORAL_SOLUTION | ORAL | Status: DC | PRN

## 2013-05-07 MED ORDER — FUROSEMIDE 20 MG PO TABS
20.0000 mg | ORAL_TABLET | Freq: Every day | ORAL | Status: DC
  Administered 2013-05-07 – 2013-05-09 (×3): 20 mg via ORAL
  Filled 2013-05-07 (×3): qty 1

## 2013-05-07 MED ORDER — ONDANSETRON HCL 4 MG/2ML IJ SOLN: 4.0000 mg | Freq: Four times a day (QID) | INTRAMUSCULAR | Status: DC | PRN

## 2013-05-07 MED ORDER — ONDANSETRON HCL 4 MG PO TABS
4.0000 mg | ORAL_TABLET | Freq: Four times a day (QID) | ORAL | Status: DC | PRN
Start: 2013-05-07 — End: 2013-05-09

## 2013-05-07 NOTE — ED Provider Notes (Signed)
CSN: [id number]     Arrival date & time 05/07/13  6144 History   First MD Initiated Contact with Patient 05/07/13 [phone]     Chief Complaint  Patient presents with  . Rectal Bleeding  . Constipation     (Consider location/radiation/quality/duration/timing/severity/associated sxs/prior Treatment) HPI Comments: Patient is an 80 year old female with history of coronary artery disease, CHF. She presents with complaints of rectal bleeding that started this morning. She states that she went to have a bowel movement and a significant amount of bright red and maroon colored blood pass into the toilet. She denies she is having any pain, fever, constipation, or other symptoms. She denies lightheadedness, chest pain, or shortness of breath.  Patient is a 80 y.o. female presenting with hematochezia and constipation. The history is provided by the patient.  Rectal Bleeding Quality:  Maroon and bright red Amount:  Moderate Duration:  1 hour Timing:  Constant Progression:  Resolved Chronicity:  New Context: not anal fissures and not constipation   Similar prior episodes: no   Relieved by:  Nothing Worsened by:  Nothing tried Ineffective treatments:  None tried Constipation Associated symptoms: hematochezia     Past Medical History  Diagnosis Date  . CAD (coronary artery disease)     a. 07/2008 Inf STEMI->RCA BMS;  b. 09/2008 CABGx2: LIMA->LAD, RIMA->OM as Y from LIMA;  b. 06/2011 MV EF 31%, bsaal to dist ant infarct, inf/inflat infarct with mild to mod peri-infarct ischemia [facility]).  . Ischemic cardiomyopathy     a. 06/2011 Echo: EF 40%, inf/inflat AK and lat HK, aortic sclerosis w/ mild AI, Mild to mod MR (prev mod-sev), Gr 1 DD, mild TR, RVSP 21mmHg  . Systolic CHF, chronic     a. 06/2011 EF 40%  . Hypertension   . Hyperlipidemia   . Hiatal hernia     large  . Tobacco abuse   . Leg pain   . Cerebrovascular disease   . COPD (chronic obstructive pulmonary disease)     presumed    . Arterial disease     peripheral arterial disease with claudication - bilateral SFA disease with ABI's 0.4-0.5 bilaterally   . CKD (chronic kidney disease), stage IV   . Mitral regurgitation     a. prev Mod-Sev, read as mild to mod by echo 06/2011 [facility]).   Past Surgical History  Procedure Laterality Date  . Percutaneous coronary intervention    . Laprotomy for bowel obstruction    . Coronary artery bypass graft      aortic thoraic anuerysm resecton 2010.   Marland Kitchen Coronary stent placement    . Cardiac valve surgery     Family History  Problem Relation Age of Onset  . Coronary artery disease Mother     extensive family history; several other family members.  . Coronary artery disease Brother   . Heart disease Mother     CHF  . Leukemia Sister   . Leukemia Sister   . Leukemia Father    History  Substance Use Topics  . Smoking status: Former Smoker -- 1.00 packs/day for 60 years    Types: Cigarettes    Quit date: 07/12/2008  . Smokeless tobacco: Never Used  . Alcohol Use: Not on file   OB History   Grav Para Term Preterm Abortions TAB SAB Ect Mult Living                 Review of Systems  Gastrointestinal: Positive for constipation and  hematochezia.  All other systems reviewed and are negative.     Allergies  Fish oil  Home Medications   Prior to Admission medications   Medication Sig Start Date End Date Taking? Authorizing Provider  amLODipine (NORVASC) 10 MG tablet TAKE ONE TABLET BY MOUTH ONCE DAILY 01/23/13   Burnell Blanks, MD  aspirin 81 MG tablet Take 81 mg by mouth daily.      Historical Provider, MD  Coenzyme Q-10 100 MG capsule Take 100 mg by mouth daily.      Historical Provider, MD  furosemide (LASIX) 20 MG tablet TAKE ONE TABLET BY MOUTH ONCE DAILY    Burnell Blanks, MD  metoprolol tartrate (LOPRESSOR) 25 MG tablet TAKE ONE TABLET BY MOUTH TWICE DAILY    Burnell Blanks, MD  nitroGLYCERIN (NITROSTAT) 0.4 MG SL tablet  Place 1 tablet (0.4 mg total) under the tongue every 5 (five) minutes as needed. 07/14/11   Rogelia Mire, NP  polyethylene glycol powder (MIRALAX) powder Take 17 g by mouth daily. Take as needed.     Historical Provider, MD  pravastatin (PRAVACHOL) 80 MG tablet Take 1 tablet (80 mg total) by mouth every evening. 10/12/12   Burnell Blanks, MD  zolpidem (AMBIEN) 5 MG tablet Take 5 mg by mouth at bedtime as needed.  10/06/11   Historical Provider, MD   There were no vitals taken for this visit. Physical Exam  Nursing note and vitals reviewed. Constitutional: She is oriented to person, place, and time. She appears well-developed and well-nourished. No distress.  HENT:  Head: Normocephalic and atraumatic.  Neck: Normal range of motion. Neck supple.  Cardiovascular: Normal rate and regular rhythm.  Exam reveals no gallop and no friction rub.   No murmur heard. Pulmonary/Chest: Effort normal and breath sounds normal. No respiratory distress. She has no wheezes.  Abdominal: Soft. Bowel sounds are normal. She exhibits no distension. There is no tenderness.  Genitourinary:  There is maroon and bright red colored blood around the rectum. There are no obvious lesions or hemorrhoids. There is no fecal impaction or mass palpated with digital rectal exam.  Musculoskeletal: Normal range of motion.  Neurological: She is alert and oriented to person, place, and time.  Skin: Skin is warm and dry. She is not diaphoretic.    ED Course  Procedures (including critical care time) Labs Review Labs Reviewed  CBC WITH DIFFERENTIAL  COMPREHENSIVE METABOLIC PANEL  TYPE AND SCREEN    Imaging Review No results found.   EKG Interpretation None      MDM   Final diagnoses:  None    Patient presents with rectal bleeding that started this morning. She has no fever or white count and hemoglobin is stable. However as she has never had a colonoscopy, I've consulted gastroenterology to discuss. Dr.  Henrene Pastor feels as though admission for observation and possible colonoscopy would be in the patient's best interest. Dr. Candiss Norse agrees to admit to the hospitalist service.    Veryl Speak, MD 05/07/13 [phone]

## 2013-05-07 NOTE — H&P (Signed)
Patient Demographics  [name], is a 80 y.o. female  MRN: [id number]   DOB - 1927/07/03  Admit Date - 05/07/2013  Outpatient Primary MD for the patient is Leonard Downing, MD Cardiologist Dr. Shon Hough, Nephrologist Dr. Azzie Roup  With History of -  Past Medical History  Diagnosis Date  . CAD (coronary artery disease)     a. 07/2008 Inf STEMI->RCA BMS;  b. 09/2008 CABGx2: LIMA->LAD, RIMA->OM as Y from LIMA;  b. 06/2011 MV EF 31%, bsaal to dist ant infarct, inf/inflat infarct with mild to mod peri-infarct ischemia [facility]).  . Ischemic cardiomyopathy     a. 06/2011 Echo: EF 40%, inf/inflat AK and lat HK, aortic sclerosis w/ mild AI, Mild to mod MR (prev mod-sev), Gr 1 DD, mild TR, RVSP 63mmHg  . Systolic CHF, chronic     a. 06/2011 EF 40%  . Hypertension   . Hyperlipidemia   . Hiatal hernia     large  . Tobacco abuse   . Leg pain   . Cerebrovascular disease   . COPD (chronic obstructive pulmonary disease)     presumed   . Arterial disease     peripheral arterial disease with claudication - bilateral SFA disease with ABI's 0.4-0.5 bilaterally   . Mitral regurgitation     a. prev Mod-Sev, read as mild to mod by echo 06/2011 [facility]).  . CKD (chronic kidney disease), stage IV     born with 1 kidney  . MI (myocardial infarction) 2010      Past Surgical History  Procedure Laterality Date  . Percutaneous coronary intervention    . Laprotomy for bowel obstruction    . Coronary artery bypass graft      aortic thoraic anuerysm resecton 2010.   Marland Kitchen Coronary stent placement    . Cardiac valve surgery      in for   Chief Complaint  Patient presents with  . Rectal Bleeding  . Constipation     HPI  [name]  is a 80 y.o. female, with history of CAD status post CABG  and stent placement cardiologist Dr. Shon Hough, chronic systolic heart failure EF 40% currently compensated, ischemic cardio myopathy, hypertension, dyslipidemia, chronic kidney disease stage IV baseline creatinine around 1.7-1.8 follows with Dr. Azzie Roup, who has never had a colonoscopy or GI evaluation in the past came into the ER after she had some blood in her stool early this morning, she also noticed a mass hanging down her rectum this morning, she was able to push it back. No previous episodes of rectal bleed but she has had problems with hemorrhoids in the past and needed hemorrhoid resection several years ago. Denies any unintentional weight loss, denies any personal or family history of colon-rectal -GI malignancy. No down or pain nausea or vomiting.   In the ER  workup which included blood work and chest x-ray (my personal read ) were stable. Case was discussed by [facility]  GI who requested hospitalist admission.    Review of Systems    In addition to the HPI above,   No Fever-chills, No Headache, No changes with Vision or hearing, No problems swallowing food or Liquids, No Chest pain, Cough or Shortness of Breath, No Abdominal pain, No Nausea or Vommitting, Bowel movements are regular, blood in stool morning as above No Blood in stool or Urine, No dysuria, No new skin rashes or bruises, No new joints pains-aches,  No new weakness, tingling, numbness in any extremity, No recent weight gain or loss, No polyuria, polydypsia or polyphagia, No significant Mental Stressors.  A full 10 point Review of Systems was done, except as stated above, all other Review of Systems were negative.   Social History History  Substance Use Topics  . Smoking status: Former Smoker -- 1.00 packs/day for 60 years    Types: Cigarettes    Quit date: 07/12/2008  . Smokeless tobacco: Never Used  . Alcohol Use: No      Family History Family History  Problem Relation Age of Onset  . Coronary  artery disease Mother     extensive family history; several other family members.  . Coronary artery disease Brother   . Heart disease Mother     CHF  . Leukemia Sister   . Leukemia Sister   . Leukemia Father       Prior to Admission medications   Medication Sig Start Date End Date Taking? Authorizing Provider  amLODipine (NORVASC) 10 MG tablet Take 10 mg by mouth daily.   Yes Historical Provider, MD  aspirin 81 MG tablet Take 81 mg by mouth daily.     Yes Historical Provider, MD  Coenzyme Q-10 100 MG capsule Take 100 mg by mouth daily.     Yes Historical Provider, MD  furosemide (LASIX) 20 MG tablet Take 20 mg by mouth daily.   Yes Historical Provider, MD  hydroxypropyl methylcellulose (ISOPTO TEARS) 2.5 % ophthalmic solution Place 1-2 drops into both eyes as needed for dry eyes.   Yes Historical Provider, MD  Melatonin 5 MG TABS Take 15 mg by mouth at bedtime as needed (sleep).   Yes Historical Provider, MD  metoprolol tartrate (LOPRESSOR) 25 MG tablet Take 25 mg by mouth 2 (two) times daily.   Yes Historical Provider, MD  pravastatin (PRAVACHOL) 80 MG tablet Take 1 tablet (80 mg total) by mouth every evening. 10/12/12  Yes Burnell Blanks, MD  nitroGLYCERIN (NITROSTAT) 0.4 MG SL tablet Place 1 tablet (0.4 mg total) under the tongue every 5 (five) minutes as needed. 07/14/11   Rogelia Mire, NP    Allergies  Allergen Reactions  . Fish Oil     Break out in scales    Physical Exam  Vitals  Blood pressure 137/54, pulse 65, temperature 98.6 F (37 C), temperature source Oral, resp. rate 18, height 5' 8.5" (1.74 m), weight 66.679 kg (147 lb), SpO2 95.00%.   1. General eldely white female lying in bed in NAD,     2. Normal affect and insight, Not Suicidal or Homicidal, Awake Alert, Oriented X 3.  3. No F.N deficits, ALL C.Nerves Intact, Strength 5/5 all 4 extremities, Sensation intact all 4 extremities, Plantars down going.  4. Ears and Eyes appear Normal,  Conjunctivae clear, PERRLA. Moist Oral Mucosa.  5. Supple Neck, No JVD, No cervical lymphadenopathy appriciated, No Carotid Bruits.  6. Symmetrical Chest wall movement, Good air movement bilaterally, CTAB.  7. RRR, No Gallops, Rubs  or Murmurs, No Parasternal Heave.  8. Positive Bowel Sounds, Abdomen Soft, Non tender, No organomegaly appriciated,No rebound -guarding or rigidity. No external hemorrhoids. Some fresh blood noted around the rectum area.  9.  No Cyanosis, Normal Skin Turgor, No Skin Rash or Bruise.  10. Good muscle tone,  joints appear normal , no effusions, Normal ROM.  11. No Palpable Lymph Nodes in Neck or Axillae     Data Review  CBC  Recent Labs Lab 05/07/13 1030  WBC 6.3  HGB 14.9  HCT 44.3  PLT 430*  MCV 89.3  MCH 30.0  MCHC 33.6  RDW 14.9  LYMPHSABS 1.2  MONOABS 0.6  EOSABS 0.1  BASOSABS 0.0   ------------------------------------------------------------------------------------------------------------------  Chemistries   Recent Labs Lab 05/07/13 1030  NA 142  K 3.9  CL 102  CO2 25  GLUCOSE 88  BUN 29*  CREATININE 1.72*  CALCIUM 9.5  AST 18  ALT 7  ALKPHOS 51  BILITOT 0.5   ------------------------------------------------------------------------------------------------------------------ estimated creatinine clearance is 25 ml/min (by C-G formula based on Cr of 1.72). ------------------------------------------------------------------------------------------------------------------ No results found for this basename: TSH, T4TOTAL, FREET3, T3FREE, THYROIDAB,  in the last 72 hours   Coagulation profile No results found for this basename: INR, PROTIME,  in the last 168 hours ------------------------------------------------------------------------------------------------------------------- No results found for this basename: DDIMER,  in the last 72  hours -------------------------------------------------------------------------------------------------------------------  Cardiac Enzymes No results found for this basename: CK, CKMB, TROPONINI, MYOGLOBIN,  in the last 168 hours ------------------------------------------------------------------------------------------------------------------ No components found with this basename: POCBNP,    ---------------------------------------------------------------------------------------------------------------  Urinalysis    Component Value Date/Time   COLORURINE YELLOW 11/15/2009 0019   APPEARANCEUR CLEAR 11/15/2009 0019   LABSPEC 1.008 11/15/2009 0019   PHURINE 5.5 11/15/2009 0019   GLUCOSEU NEGATIVE 11/15/2009 0019   GLUCOSEU NEGATIVE 10/25/2009 0955   HGBUR NEGATIVE 11/15/2009 0019   BILIRUBINUR NEGATIVE 11/15/2009 0019   KETONESUR NEGATIVE 11/15/2009 0019   PROTEINUR NEGATIVE 11/15/2009 0019   UROBILINOGEN 0.2 11/15/2009 0019   NITRITE NEGATIVE 11/15/2009 0019   LEUKOCYTESUR TRACE* 11/15/2009 0019    ----------------------------------------------------------------------------------------------------------------  Imaging results:   No results found.  EKG ordered    Assessment & Plan   1. Lower GI bleed suggestive of a reoccurrence of hemorrhoids. However rectal malignancy cannot be ruled out, will admit her to the hospital, doubt this is upper GI bleed so I will switch her from protonic strip to IV Protonix pushes, type screen, monitor H&H, clear liquid diet. [facility] has been consulted who will see the patient shortly.   2. CAD With history of ischemic cardio myopathy and chronic systolic heart failure EF 40%. Currently compensated no acute issues, holding aspirin, continue home dose beta blocker and Lasix. She is not on ACE/ARB due to underlying renal failure. Obtain baseline EKG for records and monitor on telemetry.    3. Chronic kidney disease stage IV. Baseline creatinine  around 1.8. Currently at baseline monitor.    4. Hypertension -  home dose beta blocker and Norvasc will be continued.    5. Dyslipidemia continue on statin.     DVT Prophylaxis SCDs    AM Labs Ordered, also please review Full Orders  Family Communication: Admission, patients condition and plan of care including tests being ordered have been discussed with the patient  who indicates understanding and agree with the plan and Code Status.  Code Status DNR  Likely DC to Home  Condition Fair  Time spent in minutes : [place]  Jennette Kettle M.D on 05/07/2013 at 1:21 PM  Between 7am to 7pm - Pager - [phone]  After 7pm go to [email or web address] - password [email or web address]  And look for the night coverage person covering me after hours  [facility] Office  [phone]

## 2013-05-07 NOTE — ED Notes (Signed)
PT states she was straining to have a bm and felt "something come out and not go back in".  She also noticed a lot of blood in the toilet.   Denies pain.  Hx of fecal impaction.

## 2013-05-08 ENCOUNTER — Encounter (HOSPITAL_COMMUNITY): Payer: Self-pay | Admitting: Physician Assistant

## 2013-05-08 DIAGNOSIS — N184 Chronic kidney disease, stage 4 (severe): Secondary | ICD-10-CM

## 2013-05-08 LAB — URINALYSIS, ROUTINE W REFLEX MICROSCOPIC
Bilirubin Urine: NEGATIVE
Glucose, UA: NEGATIVE mg/dL
Ketones, ur: NEGATIVE mg/dL
Nitrite: NEGATIVE
PH: 6 (ref 5.0–8.0)
Protein, ur: NEGATIVE mg/dL
SPECIFIC GRAVITY, URINE: 1.008 (ref 1.005–1.030)
UROBILINOGEN UA: 0.2 mg/dL (ref 0.0–1.0)

## 2013-05-08 LAB — CBC
HEMATOCRIT: 43.8 % (ref 36.0–46.0)
Hemoglobin: 14.4 g/dL (ref 12.0–15.0)
MCH: 29.5 pg (ref 26.0–34.0)
MCHC: 32.9 g/dL (ref 30.0–36.0)
MCV: 89.8 fL (ref 78.0–100.0)
Platelets: 406 10*3/uL — ABNORMAL HIGH (ref 150–400)
RBC: 4.88 MIL/uL (ref 3.87–5.11)
RDW: 15.1 % (ref 11.5–15.5)
WBC: 11.5 10*3/uL — AB (ref 4.0–10.5)

## 2013-05-08 LAB — BASIC METABOLIC PANEL
BUN: 24 mg/dL — ABNORMAL HIGH (ref 6–23)
CHLORIDE: 102 meq/L (ref 96–112)
CO2: 30 meq/L (ref 19–32)
CREATININE: 1.71 mg/dL — AB (ref 0.50–1.10)
Calcium: 9.5 mg/dL (ref 8.4–10.5)
GFR calc Af Amer: 30 mL/min — ABNORMAL LOW (ref >90)
GFR calc non Af Amer: 26 mL/min — ABNORMAL LOW (ref >90)
Glucose, Bld: 89 mg/dL (ref 70–99)
POTASSIUM: 5 meq/L (ref 3.7–5.3)
Sodium: 141 mEq/L (ref 137–147)

## 2013-05-08 LAB — HEMOGLOBIN AND HEMATOCRIT, BLOOD
HEMATOCRIT: 44.2 % (ref 36.0–46.0)
HEMOGLOBIN: 14.5 g/dL (ref 12.0–15.0)

## 2013-05-08 LAB — URINE MICROSCOPIC-ADD ON

## 2013-05-08 MED ORDER — SODIUM CHLORIDE 0.9 % IV SOLN
INTRAVENOUS | Status: DC
Start: 2013-05-08 — End: 2013-05-09
  Administered 2013-05-08: 18:00:00 via INTRAVENOUS

## 2013-05-08 MED ORDER — ZOLPIDEM TARTRATE 5 MG PO TABS
5.0000 mg | ORAL_TABLET | Freq: Once | ORAL | Status: AC
  Administered 2013-05-08: 5 mg via ORAL
  Filled 2013-05-08: qty 1

## 2013-05-08 MED ORDER — PANTOPRAZOLE SODIUM 40 MG PO TBEC
40.0000 mg | DELAYED_RELEASE_TABLET | Freq: Every day | ORAL | Status: DC
  Administered 2013-05-08 – 2013-05-09 (×2): 40 mg via ORAL
  Filled 2013-05-08 (×2): qty 1

## 2013-05-08 NOTE — Progress Notes (Addendum)
[facility] PROGRESS NOTE  [name] ZOX:[id number] DOB: 09/22/1927 DOA: 05/07/2013 PCP: Leonard Downing, MD  Assessment/Plan   Lower GI bleed suggestive of a reoccurrence of hemorrhoids, bleeding has stopped, hgb stable -  Appreciate GI recommendations -  Plan for inpatient colonoscopy -  Continue CLD -  Defer to GI to order NPO and cleanout   CAD s/p MI 2010, chest pain free -  holding aspirin  -  contiue BB, statin, and prn NTG  Ischemic cardio myopathy and chronic systolic heart failure EF 40% with moderate MR and mild AS, stable, appears euvolemic -  Continue lasix for now -  Continue BB -  Not on ACEI/ARB or spironolactone due to CKD stage IV  Hypertension, blood pressure stable -  Continue beta blocker and Norvasc  PVD, stable -  Restart ASA when able  Chronic kidney disease stage IV. Baseline creatinine around 1.8. Stable. -  Repeat BMP in AM -  Minimize nephrotoxins -  Renally dose medications  Leukocytosis, no respiratory symptoms, may be reactive from stress of GIB -  CXR neg -  UA with large LE, but miniminal WBC or bacteria -  No abx for now -  Add on urine culture  Diet:  CLD Access:  PIV IVF:  off Proph:  SCDs  Code Status: DNR Family Communication: patient alone Disposition Plan: pending colonoscopy, likely home tomorrow   Consultants:  GI,   Procedures:  CXR  Antibiotics:  None   HPI/Subjective:  States she had two stools since admission.  First had small amount of blood, second was without blood, both were soft.    Objective: Filed Vitals:   05/07/13 2233 05/08/13 0154 05/08/13 0547 05/08/13 1447  BP: 126/56 119/65 116/57 117/58  Pulse: 76 69 68 73  Temp: 99.2 F (37.3 C) 99.1 F (37.3 C) 99.3 F (37.4 C) 97.9 F (36.6 C)  TempSrc: Oral Oral Oral Oral  Resp: 18 18 17 18   Height:      Weight:   63.5 kg (139 lb 15.9 oz)   SpO2: 93% 94% 94% 96%    Intake/Output Summary (Last 24 hours) at 05/08/13  1606 Last data filed at 05/08/13 1300  Gross per 24 hour  Intake   1080 ml  Output    500 ml  Net    580 ml   Filed Weights   05/07/13 1012 05/07/13 1341 05/08/13 0547  Weight: 66.679 kg (147 lb) 64.4 kg (141 lb 15.6 oz) 63.5 kg (139 lb 15.9 oz)    Exam:   General:  CF, No acute distress  HEENT:  NCAT, MMM  Cardiovascular:  RRR, nl S1, S2, 3/6 systolic murmur apex and also RUSB, 2+ pulses, warm extremities  Respiratory:  CTAB, no increased WOB  Abdomen:   NABS, soft, NT/ND, soft abdominal wall hernia  MSK:   Normal tone and bulk, no LEE  Neuro:  Grossly intact  Data Reviewed: Basic Metabolic Panel:  Recent Labs Lab 05/07/13 1030 05/08/13 0455  NA 142 141  K 3.9 5.0  CL 102 102  CO2 25 30  GLUCOSE 88 89  BUN 29* 24*  CREATININE 1.72* 1.71*  CALCIUM 9.5 9.5   Liver Function Tests:  Recent Labs Lab 05/07/13 1030  AST 18  ALT 7  ALKPHOS 51  BILITOT 0.5  PROT 6.8  ALBUMIN 3.6   No results found for this basename: LIPASE, AMYLASE,  in the last 168 hours No results found for this basename: AMMONIA,  in the  last 168 hours CBC:  Recent Labs Lab 05/07/13 1030 05/07/13 1342 05/07/13 1750 05/07/13 2135 05/08/13 0455  WBC 6.3  --   --   --  11.5*  NEUTROABS 4.4  --   --   --   --   HGB 14.9 14.7 14.0 14.6 14.5  14.4  HCT 44.3 44.4 42.5 43.6 44.2  43.8  MCV 89.3  --   --   --  89.8  PLT 430*  --   --   --  406*   Cardiac Enzymes: No results found for this basename: CKTOTAL, CKMB, CKMBINDEX, TROPONINI,  in the last 168 hours BNP (last 3 results) No results found for this basename: PROBNP,  in the last 8760 hours CBG: No results found for this basename: GLUCAP,  in the last 168 hours  No results found for this or any previous visit (from the past 240 hour(s)).   Studies: Dg Chest Port 1 View  05/07/2013   CLINICAL DATA:  Cough, hematochezia  EXAM: PORTABLE CHEST - 1 VIEW  COMPARISON:  Prior chest x-ray 02/02/2011; prior CT chest 11/24/2011   FINDINGS: Stable cardiac and mediastinal contours. While there is cardiomegaly, there is also large sliding hiatal hernia with completely intrathoracic stomach. Patient is status post median sternotomy with evidence of prior CABG including LIMA bypass. The lungs are clear. Stable chronic bronchitic changes and mild interstitial prominence. No pleural effusion, pneumothorax or focal consolidation. No pulmonary edema. No acute osseous abnormality.  IMPRESSION: Stable chest x-ray without evidence of active disease.   Electronically Signed   By: Jacqulynn Cadet M.D.   On: 05/07/2013 13:44    Scheduled Meds: . amLODipine  10 mg Oral Daily  . furosemide  20 mg Oral Daily  . metoprolol tartrate  25 mg Oral BID  . pantoprazole  40 mg Oral Q0600  . simvastatin  5 mg Oral q1800  . sodium chloride  3 mL Intravenous Q12H   Continuous Infusions:   Active Problems:   TOBACCO ABUSE   HYPERTENSION   CAD   AORTIC STENOSIS/ INSUFFICIENCY, NON-RHEUMATIC   Chronic systolic heart failure   HIATAL HERNIA   CHRONIC KIDNEY DISEASE STAGE III (MODERATE)   COPD (chronic obstructive pulmonary disease)   Chest pain   CKD (chronic kidney disease), stage IV   GI bleed    Time spent: 30 min    [facility] Pager [phone]. If 7PM-7AM, please contact night-coverage at [email or web address], password [email or web address] 05/08/2013, 4:06 PM  LOS: 1 day

## 2013-05-08 NOTE — Consult Note (Signed)
[facility] Consult: 11:00 AM 05/08/2013  LOS: 1 day    Referring Provider: Dr Mamie Nick. Candiss Norse  Primary Care Physician:  Leonard Downing, MD Primary Gastroenterologist:  Dr. Fuller Plan vs Ardis Hughs.    Reason for Consultation:  Rectal bleeding.    HPI: [name] is a 80 y.o. female.  Hx CAD with BMS in 07/2008, inf MI and subsequent CABG/aortic root replacement 09/2008.  Ischemic CM (EF 40% per 01/2012 note), mitral regurge,  ASPVD, COPD, bil carotid artery stenosis, stage 4 CKD (congenital single kidney).  Left thyroid nodule (cytology 10/2012: non-neoplastic goiter).  S/P hemorrhoidectomy for bleeding in the 1970s.  Hx fecal impaction and obstipation in 2011. S/p ex lap with appendectomy in 1970s, found tubal pregnancy. Marland Kitchen   Hx UGIB with Melena in 09/2008. On Plavix at the time (no longer taking) and EGD showed MWT, gastritis. Not hospitalized at the time. Has never had colonoscopy.   Admitted 05/07/13 with prolapsed hemorrhoid and rectal bleeding.  Went to have BM yest AM and passed pure blood into commmode, felt a tissue like mass in rectum.  No signif dizziness, no abdominal pain.  Hgb 14.9 and  No significant dip on serial  repeats.  MCV, platelets and coags normal. Renal function at her baseline. Rectal exam FOB + but no mass.  Had another episode of bleeding this AM.  Only "thinner" is 81 mg ASA. No NSAIDs.  Appetite tends to be poor, no dysphagia or n/v.  No heartburn and no regular use of antacids, no PPI.  Some spells with constipation, no acceleration of this in recent months..  Uses Miralax prn but feels it works to slowly.   She is not prepared to consent to colonoscopy at present but might be persuaded.   Past Medical History  Diagnosis Date  . CAD (coronary artery disease)     a. 07/2008 Inf STEMI->RCA  BMS;  b. 09/2008 CABGx2: LIMA->LAD, RIMA->OM as Y from LIMA;  b. 06/2011 MV EF 31%, bsaal to dist ant infarct, inf/inflat infarct with mild to mod peri-infarct ischemia [facility]).  . Ischemic cardiomyopathy     a. 06/2011 Echo: EF 40%, inf/inflat AK and lat HK, aortic sclerosis w/ mild AI, Mild to mod MR (prev mod-sev), Gr 1 DD, mild TR, RVSP 52mmHg  . Systolic CHF, chronic     a. 06/2011 EF 40%  . Hypertension   . Hyperlipidemia   . Hiatal hernia     large  . Tobacco abuse   . Cerebrovascular disease   . COPD (chronic obstructive pulmonary disease)     presumed   . Arterial disease     peripheral arterial disease with claudication - bilateral SFA disease with ABI's 0.4-0.5 bilaterally   . Mitral regurgitation     a. prev Mod-Sev, read as mild to mod by echo 06/2011 [facility]).  . CKD (chronic kidney disease), stage IV     born with 1 kidney  . MI (myocardial infarction) 2010  . Thyroid nodule 10/2012    left dominant nodule cytology:non-neoplastic goiter.  Past Surgical History  Procedure Laterality Date  . Laprotomy for bowel obstruction    . Coronary artery bypass graft  09/2008    aortic thoraic anuerysm resecton 2010.   Marland Kitchen Coronary stent placement  07/2008    BMS to RCA    Prior to Admission medications   Medication Sig Start Date End Date Taking? Authorizing Provider  amLODipine (NORVASC) 10 MG tablet Take 10 mg by mouth daily.   Yes Historical Provider, MD  aspirin 81 MG tablet Take 81 mg by mouth daily.     Yes Historical Provider, MD  Coenzyme Q-10 100 MG capsule Take 100 mg by mouth daily.     Yes Historical Provider, MD  furosemide (LASIX) 20 MG tablet Take 20 mg by mouth daily.   Yes Historical Provider, MD  hydroxypropyl methylcellulose (ISOPTO TEARS) 2.5 % ophthalmic solution Place 1-2 drops into both eyes as needed for dry eyes.   Yes Historical Provider, MD  Melatonin 5 MG TABS Take 15 mg by mouth at bedtime as needed (sleep).   Yes Historical  Provider, MD  metoprolol tartrate (LOPRESSOR) 25 MG tablet Take 25 mg by mouth 2 (two) times daily.   Yes Historical Provider, MD  pravastatin (PRAVACHOL) 80 MG tablet Take 1 tablet (80 mg total) by mouth every evening. 10/12/12  Yes Burnell Blanks, MD  nitroGLYCERIN (NITROSTAT) 0.4 MG SL tablet Place 1 tablet (0.4 mg total) under the tongue every 5 (five) minutes as needed. 07/14/11   Rogelia Mire, NP    Scheduled Meds: . amLODipine  10 mg Oral Daily  . furosemide  20 mg Oral Daily  . metoprolol tartrate  25 mg Oral BID  . pantoprazole  40 mg Oral Q0600  . simvastatin  5 mg Oral q1800  . sodium chloride  3 mL Intravenous Q12H   Infusions:   PRN Meds: guaiFENesin-dextromethorphan, HYDROcodone-acetaminophen, nitroGLYCERIN, ondansetron (ZOFRAN) IV, ondansetron, polyethylene glycol, polyvinyl alcohol   Allergies as of 05/07/2013 - Review Complete 05/07/2013  Allergen Reaction Noted  . Fish oil  04/03/2011    Family History  Problem Relation Age of Onset  . Coronary artery disease Mother     extensive family history; several other family members.  . Coronary artery disease Brother   . Heart disease Mother     CHF  . Leukemia Sister   . Leukemia Sister   . Leukemia Father     History   Social History  . Marital Status: Widowed    Spouse Name: N/A    Number of Children: Y  . Years of Education: N/A   Occupational History  . 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    Social History Main Topics  . Smoking status: Former Smoker -- 1.00 packs/day for 60 years    Types: Cigarettes    Quit date: 07/12/2008  . Smokeless tobacco: Never Used  . Alcohol Use: No  . Drug Use: No  . Sexual Activity: Not on file    Social History Narrative   She lives in [place]. She is retired and widowed.     REVIEW OF SYSTEMS: Constitutional:  Dropped 8 # in last 9 months.  ENT:  No nose bleeds Pulm:  No dyspnea at rest, only with stairs or fast walking CV:  No  palpitations, no LE edema.  GU:  No hematuria, no frequency.  No blood in urine, no oliguria GI:  Per HPI.   Heme:  Treated s few years back with parenteral iron, none  recently.  No oral iron at home   Transfusions:  None ever Neuro:  No headaches.  Some HOH.  Some numness in feet/legs.  No balance problems.  Occasional dizziness.  Derm:  No itching, no rash or sores.  Endocrine:  No sweats or chills.  No polyuria or dysuria Immunization:  Regularly declines all vaccines.  Travel:  None beyond local counties in last few months.    PHYSICAL EXAM: Vital signs in last 24 hours: Filed Vitals:   05/08/13 0547  BP: 116/57  Pulse: 68  Temp: 99.3 F (37.4 C)  Resp: 17   Wt Readings from Last 3 Encounters:  05/08/13 63.5 kg (139 lb 15.9 oz)  01/20/13 64.864 kg (143 lb)  07/18/12 66.86 kg (147 lb 6.4 oz)   General: pleasant, generally well-appearing elderly WF.  comfortable Head:  No swelling, no asymmetry  Eyes:  No icterus or pallor Ears:  Somewhat HOH  Nose:  No discharge or congestion Mouth:  Upper denture, edentulous.  No sores, no yeast. Moist MM Neck:  No mass, no TMG, no bruits Lungs:  Clear bil.  No cough or dyspnea Heart: RRR.  No MRG Abdomen:  Soft, long midline incision, + ventral hernia.  Not tender.  BS active.  No bruits.   Rectal: red blood without mass or tenderness   Musc/Skeltl: no joint swelling or contractures.  Extremities:  No CCE.  Prominent veins and vascular pattern on legs and feet.  Neurologic:  No tremor, no limb weakness, no confusion.  Is HOH Skin:  No rash, no telangectasia/ Tattoos: none Nodes:  No cervical or inguinal adenopathy.    Psych:  Pleasant, relaxed.  Engaged.     LAB RESULTS:  Recent Labs  05/07/13 1030  05/07/13 1750 05/07/13 2135 05/08/13 0455  WBC 6.3  --   --   --  11.5*  HGB 14.9  < > 14.0 14.6 14.5  14.4  HCT 44.3  < > 42.5 43.6 44.2  43.8  PLT 430*  --   --   --  406*  < > = values in this interval not  displayed. BMET Lab Results  Component Value Date   NA 141 05/08/2013   NA 142 05/07/2013   NA 142 07/30/2010   K 5.0 05/08/2013   K 3.9 05/07/2013   K 4.2 07/30/2010   CL 102 05/08/2013   CL 102 05/07/2013   CL 105 07/30/2010   CO2 30 05/08/2013   CO2 25 05/07/2013   CO2 30 07/30/2010   GLUCOSE 89 05/08/2013   GLUCOSE 88 05/07/2013   GLUCOSE 92 07/30/2010   BUN 24* 05/08/2013   BUN 29* 05/07/2013   BUN 32* 07/30/2010   CREATININE 1.71* 05/08/2013   CREATININE 1.72* 05/07/2013   CREATININE 1.65* 07/30/2010   CALCIUM 9.5 05/08/2013   CALCIUM 9.5 05/07/2013   CALCIUM 9.4 07/30/2010   LFT  Recent Labs  05/07/13 1030  PROT 6.8  ALBUMIN 3.6  AST 18  ALT 7  ALKPHOS 51  BILITOT 0.5   PT/INR Lab Results  Component Value Date   INR 0.95 07/29/2010   INR 0.95 11/15/2009   INR 0.96 10/31/2008    Drugs of Abuse     Component Value Date/Time   LABOPIA NEGATIVE 07/29/2010 1557   COCAINSCRNUR NEGATIVE 07/29/2010 1557   LABBENZ NEGATIVE 07/29/2010 1557   AMPHETMU NEGATIVE 07/29/2010 1557     RADIOLOGY STUDIES: Dg Chest Port 1 View 05/07/2013   COMPARISON:  Prior chest x-ray 02/02/2011;  prior CT chest 11/24/2011  FINDINGS: Stable cardiac and mediastinal contours. While there is cardiomegaly, there is also large sliding hiatal hernia with completely intrathoracic stomach. Patient is status post median sternotomy with evidence of prior CABG including LIMA bypass. The lungs are clear. Stable chronic bronchitic changes and mild interstitial prominence. No pleural effusion, pneumothorax or focal consolidation. No pulmonary edema. No acute osseous abnormality.  IMPRESSION: Stable chest x-ray without evidence of active disease.   Electronically Signed   By: Jacqulynn Cadet M.D.   On: 05/07/2013 13:44    ENDOSCOPIC STUDIES: 2010  EGD  Dr Fuller Plan INDICATIONS: melena  ENDOSCOPIC IMPRESSION:  1) Ulcer in the distal esophagus, presumed Mallory Weiss tear  2) Moderate gastritis in the body of the stomach   RECOMMENDATIONS:  1) await pathology results  2) anti-reflux regimen  3) PPI bid for possible GERD related ulcerative esophagitis  4) office visit with Dr. Ardis Hughs in 4 weeks  4) can resume ASA and Plavix as clinically indicated Pathology: STOMACH, BODY, BIOPSY: MILD CHRONIC GASTRITIS, NO EVIDENCE OF HELICOBACTER PYLORI, INTESTINAL METAPLASIA, DYSPLASIA, OR MALIGNANCY IDENTIFIED.   IMPRESSION:   *  Painless rectal bleeding without anemia.  Rule out diverticular bleed, rule out neoplasia.   *  CAD  *  ASPVD  *  Stage 4 CKD, does not want dialysis if it comes to that.     PLAN:     *  Needs colonoscopy but not willing to consent at present.  Perhaps after conferring with MD, she may consent.   *  Clears, stopped IV Protonix. PO Protonix is fine given hx of gastritis ( rare sxs of late).  CBC in AM.    Vena Rua  05/08/2013, 11:00 AM Pager: [place] Attending  I have also seen and assessed the patient and agree with the above note. I suspect hemorrhoidal bleeding. Will try for a sigmoidoscopy (no sedation) and possible hemorrhoid banding tomorrow AM if i can get a spot. If not able and not bleeding will arrange for sigmoidoscopy later in week at [facility] as outpatient.  [name], MD, [facility] [phone] (pager) 05/08/2013 5:37 PM

## 2013-05-08 NOTE — Progress Notes (Signed)
Pt does not meet for IPT status at this time.  Changed to OBS per Dr. Sheran Fava.

## 2013-05-09 ENCOUNTER — Telehealth: Payer: Self-pay

## 2013-05-09 ENCOUNTER — Encounter (HOSPITAL_COMMUNITY)
Admission: EM | Disposition: A | Discharge status: Home / Self Care | Payer: Self-pay | Source: Home / Self Care | Attending: Internal Medicine

## 2013-05-09 ENCOUNTER — Encounter (HOSPITAL_COMMUNITY): Payer: Self-pay

## 2013-05-09 ENCOUNTER — Other Ambulatory Visit: Payer: Self-pay

## 2013-05-09 DIAGNOSIS — K626 Ulcer of anus and rectum: Secondary | ICD-10-CM | POA: Clinically undetermined

## 2013-05-09 DIAGNOSIS — Z860100 Personal history of colon polyps, unspecified: Secondary | ICD-10-CM | POA: Diagnosis present

## 2013-05-09 DIAGNOSIS — K621 Rectal polyp: Secondary | ICD-10-CM

## 2013-05-09 DIAGNOSIS — K644 Residual hemorrhoidal skin tags: Secondary | ICD-10-CM

## 2013-05-09 DIAGNOSIS — D126 Benign neoplasm of colon, unspecified: Secondary | ICD-10-CM

## 2013-05-09 DIAGNOSIS — K648 Other hemorrhoids: Secondary | ICD-10-CM

## 2013-05-09 HISTORY — DX: Personal history of colonic polyps: Z86.010

## 2013-05-09 HISTORY — DX: Personal history of colon polyps, unspecified: Z86.0100

## 2013-05-09 HISTORY — PX: FLEXIBLE SIGMOIDOSCOPY: SHX5431

## 2013-05-09 LAB — BASIC METABOLIC PANEL
BUN: 22 mg/dL (ref 6–23)
CO2: 27 mEq/L (ref 19–32)
CREATININE: 1.79 mg/dL — AB (ref 0.50–1.10)
Calcium: 9.1 mg/dL (ref 8.4–10.5)
Chloride: 98 mEq/L (ref 96–112)
GFR calc non Af Amer: 25 mL/min — ABNORMAL LOW (ref >90)
GFR, EST AFRICAN AMERICAN: 29 mL/min — AB (ref >90)
GLUCOSE: 87 mg/dL (ref 70–99)
Potassium: 4.2 mEq/L (ref 3.7–5.3)
Sodium: 137 mEq/L (ref 137–147)

## 2013-05-09 LAB — CBC
HCT: 42.3 % (ref 36.0–46.0)
HEMOGLOBIN: 14.1 g/dL (ref 12.0–15.0)
MCH: 29.8 pg (ref 26.0–34.0)
MCHC: 33.3 g/dL (ref 30.0–36.0)
MCV: 89.4 fL (ref 78.0–100.0)
Platelets: 370 10*3/uL (ref 150–400)
RBC: 4.73 MIL/uL (ref 3.87–5.11)
RDW: 14.9 % (ref 11.5–15.5)
WBC: 7 10*3/uL (ref 4.0–10.5)

## 2013-05-09 SURGERY — SIGMOIDOSCOPY, FLEXIBLE

## 2013-05-09 MED ORDER — POLYETHYLENE GLYCOL 3350 17 G PO PACK: 17.0000 g | PACK | Freq: Every day | ORAL | Status: DC | PRN

## 2013-05-09 MED ORDER — FENTANYL CITRATE 0.05 MG/ML IJ SOLN
INTRAMUSCULAR | Status: AC
  Filled 2013-05-09: qty 2

## 2013-05-09 MED ORDER — MIDAZOLAM HCL 5 MG/ML IJ SOLN
INTRAMUSCULAR | Status: AC
  Filled 2013-05-09: qty 1

## 2013-05-09 NOTE — Progress Notes (Signed)
          Daily Rounding Note  05/09/2013, 8:36 AM  LOS: 2 days   SUBJECTIVE:       No stools in more than 24 hours.  Feels well.  No dizziness.   OBJECTIVE:         Vital signs in last 24 hours:    Temp:  [97.9 F (36.6 C)-98.4 F (36.9 C)] 98.4 F (36.9 C) (04/28 0620) Pulse Rate:  [63-73] 63 (04/28 0620) Resp:  [17-18] 17 (04/28 0620) BP: (117-138)/(53-60) 125/53 mmHg (04/28 0620) SpO2:  [92 %-96 %] 94 % (04/28 0620) Weight:  [63.8 kg (140 lb 10.5 oz)] 63.8 kg (140 lb 10.5 oz) (04/28 0620) Last BM Date: 05/08/13 General: looks well, comfortable   Heart: RRR Chest: clear Abdomen: soft, NT, ND.  No tenderness, active BS  Extremities: no CCE Neuro/Psych:  Oriented x 3.  No tremors, no somnolence.   Intake/Output from previous day: 04/27 0701 - 04/28 0700 In: 1460 [P.O.:1200; I.V.:260] Out: 400 [Urine:400]  Intake/Output this shift:    Lab Results:  Recent Labs  05/07/13 1030  05/07/13 2135 05/08/13 0455 05/09/13 0600  WBC 6.3  --   --  11.5* 7.0  HGB 14.9  < > 14.6 14.5  14.4 14.1  HCT 44.3  < > 43.6 44.2  43.8 42.3  PLT 430*  --   --  406* 370  < > = values in this interval not displayed. BMET  Recent Labs  05/07/13 1030 05/08/13 0455 05/09/13 0600  NA 142 141 137  K 3.9 5.0 4.2  CL 102 102 98  CO2 25 30 27   GLUCOSE 88 89 87  BUN 29* 24* 22  CREATININE 1.72* 1.71* 1.79*  CALCIUM 9.5 9.5 9.1   LFT  Recent Labs  05/07/13 1030  PROT 6.8  ALBUMIN 3.6  AST 18  ALT 7  ALKPHOS 51  BILITOT 0.5    ASSESMENT:   *  Painless rectal bleeding.  Hgb stable.  * 2010 EGD with MWT/gastritis.    PLAN   *  Flex sig at 1630 today. Prep with tap water enema.     Vena Rua  05/09/2013, 8:36 AM Pager: [phone]

## 2013-05-09 NOTE — Op Note (Signed)
[facility] [place], [id number]   FLEXIBLE SIGMOIDOSCOPY PROCEDURE REPORT  PATIENT: [name], [name]  MR#: [id number] BIRTHDATE: [birth year] , 46  yrs. old GENDER: Female ENDOSCOPIST: [name], MD, [facility] PROCEDURE DATE:  05/09/2013 PROCEDURE:   Sigmoidoscopy with biopsy ASA CLASS:   Class IV INDICATIONS:rectal bleeding. MEDICATIONS: None  DESCRIPTION OF PROCEDURE:   After the risks benefits and alternatives of the procedure were thoroughly explained, informed consent was obtained.  revealed no abnormalities of the rectum. The [id number] ([id number])  endoscope was introduced through the anus  and advanced to the sigmoid colon , limited by No adverse events experienced.   The quality of the prep was fair .  The instrument was then slowly withdrawn as the mucosa was fully examined.       1.  FINDINGS: Three ulcers the rectum. Small to medium. Multiple biopsies were performed using cold forceps. 2.  A large pedunculated polyp was found in the rectosigmoid colon.  3.  Diverticulosis was noted in the sigmoid colon. Retroflexed views revealed internal/external hemorrhoids (small). The scope was then withdrawn from the patient and the procedure terminated.  COMPLICATIONS: There were no complications.  ENDOSCOPIC IMPRESSION: 1.   Three rectal ulcers ? traumatic (enema), ? stercoral (she is constipated), ? other 2.   Large pedunculated polyp was found in the rectosigmoid colon 3.   Diverticulosis was noted in the sigmoid colon 4.   Internal hemorrhoids 5.   External hemorrhoids  RECOMMENDATIONS: 1.  colonoscopy 2.   My office will contact her with biopsy results and to arrange outpatient colonoscopy and polypectomy (at [facility]) 3. MiraLax 1 dose every day unless having diarrhea 4. rectal bleeding will probably continue until polypectomy done     eSigned:  [name], MD, [facility] 05/09/2013 11:40 AM

## 2013-05-09 NOTE — Telephone Encounter (Signed)
Patient's daughter notified of the pre-visit appt for tomorrow

## 2013-05-09 NOTE — Progress Notes (Signed)
Not sedated

## 2013-05-09 NOTE — Telephone Encounter (Signed)
I have scheduled an appt for colonoscopy at [facility] for 05/12/13 9:00 arrival.  She is set up for a pre-visit tomorrow at 1:00.  I have left messages for the patient's daughters.

## 2013-05-09 NOTE — Discharge Summary (Addendum)
Physician Discharge Summary  [name] WUJ:[id number] DOB: 1927-06-09 DOA: 05/07/2013  PCP: Leonard Downing, MD  Admit date: 05/07/2013 Discharge date: 05/09/2013  Recommendations for Outpatient Follow-up:  1. Followup with gastroenterology for her colonoscopy and removal of large rectal polyp.  They will review the results of the rectal biopsies at her next appointment. 2. Primary care doctor in one week to followup results of pending urine culture.  Discharge Diagnoses:  Principal Problem:   GI bleed due to bleeding polyp and rectal ulcers Active Problems:   TOBACCO ABUSE   HYPERTENSION   CAD   AORTIC STENOSIS/ INSUFFICIENCY, NON-RHEUMATIC   Chronic systolic heart failure   HIATAL HERNIA   CHRONIC KIDNEY DISEASE STAGE III (MODERATE)   COPD (chronic obstructive pulmonary disease)   Chest pain   CKD (chronic kidney disease), stage IV   Benign neoplasm of colon   Rectal ulcers   Ulcer of rectum   Rectal polyp   Internal and external hemorrhoids without complication   Discharge Condition: Stable, improved  Diet recommendation: Healthy heart  Wt Readings from Last 3 Encounters:  05/09/13 63.8 kg (140 lb 10.5 oz)  05/09/13 63.8 kg (140 lb 10.5 oz)  01/20/13 64.864 kg (143 lb)    History of present illness:  [name] is a 80 y.o. female, with history of CAD status post CABG and stent placement cardiologist Dr. Shon Hough, chronic systolic heart failure EF 40% currently compensated, ischemic cardio myopathy, hypertension, dyslipidemia, chronic kidney disease stage IV baseline creatinine around 1.7-1.8 follows with Dr. Azzie Roup, who has never had a colonoscopy or GI evaluation in the past came into the ER after she had some blood in her stool early this morning, she also noticed a mass hanging down her rectum this morning, she was able to push it back. No previous episodes of rectal bleed but she has had problems with hemorrhoids in the past and needed  hemorrhoid resection several years ago. Denies any unintentional weight loss, denies any personal or family history of colon-rectal -GI malignancy. No down or pain nausea or vomiting.  In the ER workup which included blood work and chest x-ray (my personal read ) were stable. Case was discussed by [facility] who requested hospitalist admission.    Hospital Course:   Lower GI bleed likely secondary to rectal polyp.  Her bleeding stopped quickly. Her hemoglobin remained stable. She was seen by gastroenterology and underwent flexible sigmoidoscopy on 4/28. She was found to have 3 rectal ulcers which were biopsied, a large rectal polyp, internal and external hemorrhoids.  Her polyp was not removed at this time and the gastroenterologist felt that she probably will continue to have some rectal bleeding until this is removed. She will be set up for outpatient colonoscopy in a few weeks. Her ulcers were biopsied to rule out malignancy however these may also be due to constipation. She is advised to use MiraLax daily said that she has soft regular bowel movements.    CAD status post MI in 2010, chest pain-free. She continued beta blocker, statin and as needed nitroglycerin.  She may resume her aspirin but is advised to stop approximately 5 days prior to her procedure. It should be resumed as soon as possible post procedure.  Ischemic cardiomyopathy with chronic systolic heart failure with ejection fraction of 40%, moderate MR and mild AF, stable. She was euvolemic. She continued her Lasix, beta blocker. She is not on an ACE inhibitor or spironolactone secondary to chronic kidney disease stage IV.  Hypertension, blood pressure stable on beta blocker and Norvasc.  Peripheral vascular disease, on aspirin. See above.  Chronic kidney disease stage IV, baseline creatinine around 1.8. Remained stable.  Leukocytosis was likely related related to stress from GI bleed. Her chest x-ray was negative. Her urinalysis  demonstrated large leukocyte esterase but had minimal white blood cells and bacteria. A urine culture was added. She was symptom-free. Primary care doctor to followup on the results of pending urine culture.  Leukocytosis resolved spontaneously.    Consultants:  GI, Dr. Carlean Purl Procedures:  CXR Flex-sig with biopsies of rectal ulcers 4/28 Antibiotics:  None    Discharge Exam: Filed Vitals:   05/09/13 1317  BP: 132/65  Pulse: 71  Temp: 97.8 F (36.6 C)  Resp: 18   Filed Vitals:   05/09/13 1122 05/09/13 1132 05/09/13 1211 05/09/13 1317  BP: 115/48 120/54  132/65  Pulse:  64  71  Temp:    97.8 F (36.6 C)  TempSrc:    Oral  Resp: 20 22 20 18   Height:      Weight:      SpO2: 96% 93% 95% 95%    General: CF, No acute distress  HEENT: NCAT, MMM  Cardiovascular: RRR, nl S1, S2, 3/6 systolic murmur apex and also RUSB, 2+ pulses, warm extremities  Respiratory: CTAB, no increased WOB  Abdomen: NABS, soft, NT/ND, soft abdominal wall hernia  MSK: Normal tone and bulk, no LEE  Neuro: Grossly intact   Discharge Instructions      Discharge Orders   Future Orders Complete By Expires   (HEART FAILURE PATIENTS) Call MD:  Anytime you have any of the following symptoms: 1) 3 pound weight gain in 24 hours or 5 pounds in 1 week 2) shortness of breath, with or without a dry hacking cough 3) swelling in the hands, feet or stomach 4) if you have to sleep on extra pillows at night in order to breathe.  As directed    Call MD for:  difficulty breathing, headache or visual disturbances  As directed    Call MD for:  extreme fatigue  As directed    Call MD for:  hives  As directed    Call MD for:  persistant dizziness or light-headedness  As directed    Call MD for:  persistant nausea and vomiting  As directed    Call MD for:  severe uncontrolled pain  As directed    Call MD for:  temperature >100.4  As directed    Diet - low sodium heart healthy  As directed    Discharge instructions  As  directed    Increase activity slowly  As directed        Medication List         amLODipine 10 MG tablet  Commonly known as:  NORVASC  Take 10 mg by mouth daily.     aspirin 81 MG tablet  Take 81 mg by mouth daily.     Coenzyme Q-10 100 MG capsule  Take 100 mg by mouth daily.     furosemide 20 MG tablet  Commonly known as:  LASIX  Take 20 mg by mouth daily.     hydroxypropyl methylcellulose 2.5 % ophthalmic solution  Commonly known as:  ISOPTO TEARS  Place 1-2 drops into both eyes as needed for dry eyes.     Melatonin 5 MG Tabs  Take 15 mg by mouth at bedtime as needed (sleep).     metoprolol tartrate 25  MG tablet  Commonly known as:  LOPRESSOR  Take 25 mg by mouth 2 (two) times daily.     nitroGLYCERIN 0.4 MG SL tablet  Commonly known as:  NITROSTAT  Place 1 tablet (0.4 mg total) under the tongue every 5 (five) minutes as needed.     polyethylene glycol packet  Commonly known as:  MIRALAX / GLYCOLAX  Take 17 g by mouth daily as needed for mild constipation.     pravastatin 80 MG tablet  Commonly known as:  PRAVACHOL  Take 1 tablet (80 mg total) by mouth every evening.       Follow-up Information   Follow up with Silvano Rusk, MD. (Office will contact you about arranging colonoscopy)    Specialty:  Gastroenterology   Contact information:   [address] [place] [address] [phone]       Follow up with Leonard Downing, MD On 05/15/2013. (@10 :30 am spoke with Amy)    Specialty:  Family Medicine   Contact information:   [place] [address] [phone]        The results of significant diagnostics from this hospitalization (including imaging, microbiology, ancillary and laboratory) are listed below for reference.    Significant Diagnostic Studies: Dg Chest Port 1 View  05/07/2013   CLINICAL DATA:  Cough, hematochezia  EXAM: PORTABLE CHEST - 1 VIEW  COMPARISON:  Prior chest x-ray 02/02/2011; prior CT chest  11/24/2011  FINDINGS: Stable cardiac and mediastinal contours. While there is cardiomegaly, there is also large sliding hiatal hernia with completely intrathoracic stomach. Patient is status post median sternotomy with evidence of prior CABG including LIMA bypass. The lungs are clear. Stable chronic bronchitic changes and mild interstitial prominence. No pleural effusion, pneumothorax or focal consolidation. No pulmonary edema. No acute osseous abnormality.  IMPRESSION: Stable chest x-ray without evidence of active disease.   Electronically Signed   By: Jacqulynn Cadet M.D.   On: 05/07/2013 13:44    Microbiology: No results found for this or any previous visit (from the past 240 hour(s)).   Labs: Basic Metabolic Panel:  Recent Labs Lab 05/07/13 1030 05/08/13 0455 05/09/13 0600  NA 142 141 137  K 3.9 5.0 4.2  CL 102 102 98  CO2 25 30 27   GLUCOSE 88 89 87  BUN 29* 24* 22  CREATININE 1.72* 1.71* 1.79*  CALCIUM 9.5 9.5 9.1   Liver Function Tests:  Recent Labs Lab 05/07/13 1030  AST 18  ALT 7  ALKPHOS 51  BILITOT 0.5  PROT 6.8  ALBUMIN 3.6   No results found for this basename: LIPASE, AMYLASE,  in the last 168 hours No results found for this basename: AMMONIA,  in the last 168 hours CBC:  Recent Labs Lab 05/07/13 1030 05/07/13 1342 05/07/13 1750 05/07/13 2135 05/08/13 0455 05/09/13 0600  WBC 6.3  --   --   --  11.5* 7.0  NEUTROABS 4.4  --   --   --   --   --   HGB 14.9 14.7 14.0 14.6 14.5  14.4 14.1  HCT 44.3 44.4 42.5 43.6 44.2  43.8 42.3  MCV 89.3  --   --   --  89.8 89.4  PLT 430*  --   --   --  406* 370   Cardiac Enzymes: No results found for this basename: CKTOTAL, CKMB, CKMBINDEX, TROPONINI,  in the last 168 hours BNP: BNP (last 3 results) No results found for this basename: PROBNP,  in  the last 8760 hours CBG: No results found for this basename: GLUCAP,  in the last 168 hours  Time coordinating discharge: 45 minutes  Signed:  Janece Canterbury  [facility] 05/09/2013, 2:33 PM

## 2013-05-09 NOTE — Progress Notes (Signed)
Pt is being discharged home. Daughter is at bedside to transport patient home. Pt has been provided with discharge instructions. RN went over discharge instructions with the patient and answered all questions the patient had

## 2013-05-10 ENCOUNTER — Encounter (HOSPITAL_COMMUNITY): Payer: Self-pay | Admitting: Internal Medicine

## 2013-05-10 ENCOUNTER — Encounter (HOSPITAL_COMMUNITY): Payer: Self-pay | Admitting: Pharmacy Technician

## 2013-05-10 ENCOUNTER — Encounter (HOSPITAL_COMMUNITY): Payer: Self-pay | Admitting: *Deleted

## 2013-05-10 ENCOUNTER — Ambulatory Visit (AMBULATORY_SURGERY_CENTER): Payer: Self-pay

## 2013-05-10 VITALS — Ht 66.5 in | Wt 142.0 lb

## 2013-05-10 DIAGNOSIS — Z860100 Personal history of colon polyps, unspecified: Secondary | ICD-10-CM

## 2013-05-10 LAB — URINE CULTURE

## 2013-05-10 MED ORDER — SUPREP BOWEL PREP KIT 17.5-3.13-1.6 GM/177ML PO SOLN: 1.0000 | Freq: Once | ORAL | Status: DC

## 2013-05-10 NOTE — Progress Notes (Signed)
No allergies eggs or soy. No home oxygen. No past problems with anesthesia. No email. No diet/weight loss meds.

## 2013-05-12 ENCOUNTER — Encounter (HOSPITAL_COMMUNITY)
Admission: RE | Disposition: A | Discharge status: Home / Self Care | Payer: Self-pay | Source: Ambulatory Visit | Attending: Internal Medicine

## 2013-05-12 ENCOUNTER — Ambulatory Visit (HOSPITAL_COMMUNITY)
Admission: RE | Admit: 2013-05-12 | Discharge: 2013-05-12 | Disposition: A | Discharge status: Home / Self Care | Payer: Medicare Other | Source: Ambulatory Visit | Attending: Internal Medicine | Admitting: Internal Medicine

## 2013-05-12 ENCOUNTER — Encounter (HOSPITAL_COMMUNITY): Payer: Medicare Other | Admitting: Anesthesiology

## 2013-05-12 ENCOUNTER — Encounter (HOSPITAL_COMMUNITY): Payer: Self-pay | Admitting: *Deleted

## 2013-05-12 ENCOUNTER — Ambulatory Visit (HOSPITAL_COMMUNITY): Payer: Medicare Other | Admitting: Anesthesiology

## 2013-05-12 DIAGNOSIS — K620 Anal polyp: Secondary | ICD-10-CM

## 2013-05-12 DIAGNOSIS — D129 Benign neoplasm of anus and anal canal: Secondary | ICD-10-CM

## 2013-05-12 DIAGNOSIS — J449 Chronic obstructive pulmonary disease, unspecified: Secondary | ICD-10-CM | POA: Insufficient documentation

## 2013-05-12 DIAGNOSIS — J4489 Other specified chronic obstructive pulmonary disease: Secondary | ICD-10-CM | POA: Insufficient documentation

## 2013-05-12 DIAGNOSIS — D128 Benign neoplasm of rectum: Secondary | ICD-10-CM | POA: Insufficient documentation

## 2013-05-12 DIAGNOSIS — I2510 Atherosclerotic heart disease of native coronary artery without angina pectoris: Secondary | ICD-10-CM | POA: Insufficient documentation

## 2013-05-12 DIAGNOSIS — K625 Hemorrhage of anus and rectum: Secondary | ICD-10-CM | POA: Insufficient documentation

## 2013-05-12 DIAGNOSIS — I059 Rheumatic mitral valve disease, unspecified: Secondary | ICD-10-CM | POA: Insufficient documentation

## 2013-05-12 DIAGNOSIS — I129 Hypertensive chronic kidney disease with stage 1 through stage 4 chronic kidney disease, or unspecified chronic kidney disease: Secondary | ICD-10-CM | POA: Insufficient documentation

## 2013-05-12 DIAGNOSIS — Z888 Allergy status to other drugs, medicaments and biological substances status: Secondary | ICD-10-CM | POA: Insufficient documentation

## 2013-05-12 DIAGNOSIS — E785 Hyperlipidemia, unspecified: Secondary | ICD-10-CM | POA: Insufficient documentation

## 2013-05-12 DIAGNOSIS — Z7982 Long term (current) use of aspirin: Secondary | ICD-10-CM | POA: Insufficient documentation

## 2013-05-12 DIAGNOSIS — Z79899 Other long term (current) drug therapy: Secondary | ICD-10-CM | POA: Insufficient documentation

## 2013-05-12 DIAGNOSIS — Z1211 Encounter for screening for malignant neoplasm of colon: Secondary | ICD-10-CM | POA: Insufficient documentation

## 2013-05-12 DIAGNOSIS — Z87891 Personal history of nicotine dependence: Secondary | ICD-10-CM | POA: Insufficient documentation

## 2013-05-12 DIAGNOSIS — K621 Rectal polyp: Secondary | ICD-10-CM

## 2013-05-12 DIAGNOSIS — D126 Benign neoplasm of colon, unspecified: Secondary | ICD-10-CM | POA: Insufficient documentation

## 2013-05-12 DIAGNOSIS — Z8673 Personal history of transient ischemic attack (TIA), and cerebral infarction without residual deficits: Secondary | ICD-10-CM | POA: Insufficient documentation

## 2013-05-12 DIAGNOSIS — Z951 Presence of aortocoronary bypass graft: Secondary | ICD-10-CM | POA: Insufficient documentation

## 2013-05-12 DIAGNOSIS — K644 Residual hemorrhoidal skin tags: Secondary | ICD-10-CM | POA: Insufficient documentation

## 2013-05-12 DIAGNOSIS — I2589 Other forms of chronic ischemic heart disease: Secondary | ICD-10-CM | POA: Insufficient documentation

## 2013-05-12 DIAGNOSIS — K648 Other hemorrhoids: Secondary | ICD-10-CM | POA: Insufficient documentation

## 2013-05-12 DIAGNOSIS — N184 Chronic kidney disease, stage 4 (severe): Secondary | ICD-10-CM | POA: Insufficient documentation

## 2013-05-12 DIAGNOSIS — E041 Nontoxic single thyroid nodule: Secondary | ICD-10-CM | POA: Insufficient documentation

## 2013-05-12 DIAGNOSIS — I252 Old myocardial infarction: Secondary | ICD-10-CM | POA: Insufficient documentation

## 2013-05-12 HISTORY — DX: Unspecified cataract: H26.9

## 2013-05-12 HISTORY — PX: COLONOSCOPY: SHX5424

## 2013-05-12 SURGERY — COLONOSCOPY
Anesthesia: Monitor Anesthesia Care

## 2013-05-12 MED ORDER — LACTATED RINGERS IV SOLN
INTRAVENOUS | Status: DC | PRN
  Administered 2013-05-12: 09:00:00 via INTRAVENOUS

## 2013-05-12 MED ORDER — PROPOFOL 10 MG/ML IV BOLUS
INTRAVENOUS | Status: AC
  Filled 2013-05-12: qty 20

## 2013-05-12 MED ORDER — MIDAZOLAM HCL 2 MG/2ML IJ SOLN
INTRAMUSCULAR | Status: AC
  Filled 2013-05-12: qty 2

## 2013-05-12 MED ORDER — SODIUM CHLORIDE 0.9 % IV SOLN: INTRAVENOUS | Status: DC

## 2013-05-12 MED ORDER — KETAMINE HCL 10 MG/ML IJ SOLN
INTRAMUSCULAR | Status: DC | PRN
  Administered 2013-05-12: 20 mg via INTRAVENOUS

## 2013-05-12 MED ORDER — MIDAZOLAM HCL 5 MG/5ML IJ SOLN
INTRAMUSCULAR | Status: DC | PRN
  Administered 2013-05-12: 2 mg via INTRAVENOUS

## 2013-05-12 MED ORDER — ASPIRIN 81 MG PO TABS: 81.0000 mg | ORAL_TABLET | Freq: Every day | ORAL | Status: DC

## 2013-05-12 MED ORDER — PROPOFOL INFUSION 10 MG/ML OPTIME
INTRAVENOUS | Status: DC | PRN
  Administered 2013-05-12: 70 ug/kg/min via INTRAVENOUS

## 2013-05-12 NOTE — H&P (View-Only) (Signed)
[facility] Consult: 11:00 AM 05/08/2013  LOS: 1 day    Referring Provider: Dr Mamie Nick. Candiss Norse  Primary Care Physician:  Leonard Downing, MD Primary Gastroenterologist:  Dr. Fuller Plan vs Ardis Hughs.    Reason for Consultation:  Rectal bleeding.    HPI: [name] is a 80 y.o. female.  Hx CAD with BMS in 07/2008, inf MI and subsequent CABG/aortic root replacement 09/2008.  Ischemic CM (EF 40% per 01/2012 note), mitral regurge,  ASPVD, COPD, bil carotid artery stenosis, stage 4 CKD (congenital single kidney).  Left thyroid nodule (cytology 10/2012: non-neoplastic goiter).  S/P hemorrhoidectomy for bleeding in the 1970s.  Hx fecal impaction and obstipation in 2011. S/p ex lap with appendectomy in 1970s, found tubal pregnancy. Marland Kitchen   Hx UGIB with Melena in 09/2008. On Plavix at the time (no longer taking) and EGD showed MWT, gastritis. Not hospitalized at the time. Has never had colonoscopy.   Admitted 05/07/13 with prolapsed hemorrhoid and rectal bleeding.  Went to have BM yest AM and passed pure blood into commmode, felt a tissue like mass in rectum.  No signif dizziness, no abdominal pain.  Hgb 14.9 and  No significant dip on serial  repeats.  MCV, platelets and coags normal. Renal function at her baseline. Rectal exam FOB + but no mass.  Had another episode of bleeding this AM.  Only "thinner" is 81 mg ASA. No NSAIDs.  Appetite tends to be poor, no dysphagia or n/v.  No heartburn and no regular use of antacids, no PPI.  Some spells with constipation, no acceleration of this in recent months..  Uses Miralax prn but feels it works to slowly.   She is not prepared to consent to colonoscopy at present but might be persuaded.   Past Medical History  Diagnosis Date  . CAD (coronary artery disease)     a. 07/2008 Inf STEMI->RCA  BMS;  b. 09/2008 CABGx2: LIMA->LAD, RIMA->OM as Y from LIMA;  b. 06/2011 MV EF 31%, bsaal to dist ant infarct, inf/inflat infarct with mild to mod peri-infarct ischemia [facility]).  . Ischemic cardiomyopathy     a. 06/2011 Echo: EF 40%, inf/inflat AK and lat HK, aortic sclerosis w/ mild AI, Mild to mod MR (prev mod-sev), Gr 1 DD, mild TR, RVSP 92mmHg  . Systolic CHF, chronic     a. 06/2011 EF 40%  . Hypertension   . Hyperlipidemia   . Hiatal hernia     large  . Tobacco abuse   . Cerebrovascular disease   . COPD (chronic obstructive pulmonary disease)     presumed   . Arterial disease     peripheral arterial disease with claudication - bilateral SFA disease with ABI's 0.4-0.5 bilaterally   . Mitral regurgitation     a. prev Mod-Sev, read as mild to mod by echo 06/2011 [facility]).  . CKD (chronic kidney disease), stage IV     born with 1 kidney  . MI (myocardial infarction) 2010  . Thyroid nodule 10/2012    left dominant nodule cytology:non-neoplastic goiter.  Past Surgical History  Procedure Laterality Date  . Laprotomy for bowel obstruction    . Coronary artery bypass graft  09/2008    aortic thoraic anuerysm resecton 2010.   Marland Kitchen Coronary stent placement  07/2008    BMS to RCA    Prior to Admission medications   Medication Sig Start Date End Date Taking? Authorizing Provider  amLODipine (NORVASC) 10 MG tablet Take 10 mg by mouth daily.   Yes Historical Provider, MD  aspirin 81 MG tablet Take 81 mg by mouth daily.     Yes Historical Provider, MD  Coenzyme Q-10 100 MG capsule Take 100 mg by mouth daily.     Yes Historical Provider, MD  furosemide (LASIX) 20 MG tablet Take 20 mg by mouth daily.   Yes Historical Provider, MD  hydroxypropyl methylcellulose (ISOPTO TEARS) 2.5 % ophthalmic solution Place 1-2 drops into both eyes as needed for dry eyes.   Yes Historical Provider, MD  Melatonin 5 MG TABS Take 15 mg by mouth at bedtime as needed (sleep).   Yes Historical  Provider, MD  metoprolol tartrate (LOPRESSOR) 25 MG tablet Take 25 mg by mouth 2 (two) times daily.   Yes Historical Provider, MD  pravastatin (PRAVACHOL) 80 MG tablet Take 1 tablet (80 mg total) by mouth every evening. 10/12/12  Yes Burnell Blanks, MD  nitroGLYCERIN (NITROSTAT) 0.4 MG SL tablet Place 1 tablet (0.4 mg total) under the tongue every 5 (five) minutes as needed. 07/14/11   Rogelia Mire, NP    Scheduled Meds: . amLODipine  10 mg Oral Daily  . furosemide  20 mg Oral Daily  . metoprolol tartrate  25 mg Oral BID  . pantoprazole  40 mg Oral Q0600  . simvastatin  5 mg Oral q1800  . sodium chloride  3 mL Intravenous Q12H   Infusions:   PRN Meds: guaiFENesin-dextromethorphan, HYDROcodone-acetaminophen, nitroGLYCERIN, ondansetron (ZOFRAN) IV, ondansetron, polyethylene glycol, polyvinyl alcohol   Allergies as of 05/07/2013 - Review Complete 05/07/2013  Allergen Reaction Noted  . Fish oil  04/03/2011    Family History  Problem Relation Age of Onset  . Coronary artery disease Mother     extensive family history; several other family members.  . Coronary artery disease Brother   . Heart disease Mother     CHF  . Leukemia Sister   . Leukemia Sister   . Leukemia Father     History   Social History  . Marital Status: Widowed    Spouse Name: N/A    Number of Children: Y  . Years of Education: N/A   Occupational History  . 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    Social History Main Topics  . Smoking status: Former Smoker -- 1.00 packs/day for 60 years    Types: Cigarettes    Quit date: 07/12/2008  . Smokeless tobacco: Never Used  . Alcohol Use: No  . Drug Use: No  . Sexual Activity: Not on file    Social History Narrative   She lives in [place]. She is retired and widowed.     REVIEW OF SYSTEMS: Constitutional:  Dropped 8 # in last 9 months.  ENT:  No nose bleeds Pulm:  No dyspnea at rest, only with stairs or fast walking CV:  No  palpitations, no LE edema.  GU:  No hematuria, no frequency.  No blood in urine, no oliguria GI:  Per HPI.   Heme:  Treated s few years back with parenteral iron, none  recently.  No oral iron at home   Transfusions:  None ever Neuro:  No headaches.  Some HOH.  Some numness in feet/legs.  No balance problems.  Occasional dizziness.  Derm:  No itching, no rash or sores.  Endocrine:  No sweats or chills.  No polyuria or dysuria Immunization:  Regularly declines all vaccines.  Travel:  None beyond local counties in last few months.    PHYSICAL EXAM: Vital signs in last 24 hours: Filed Vitals:   05/08/13 0547  BP: 116/57  Pulse: 68  Temp: 99.3 F (37.4 C)  Resp: 17   Wt Readings from Last 3 Encounters:  05/08/13 63.5 kg (139 lb 15.9 oz)  01/20/13 64.864 kg (143 lb)  07/18/12 66.86 kg (147 lb 6.4 oz)   General: pleasant, generally well-appearing elderly WF.  comfortable Head:  No swelling, no asymmetry  Eyes:  No icterus or pallor Ears:  Somewhat HOH  Nose:  No discharge or congestion Mouth:  Upper denture, edentulous.  No sores, no yeast. Moist MM Neck:  No mass, no TMG, no bruits Lungs:  Clear bil.  No cough or dyspnea Heart: RRR.  No MRG Abdomen:  Soft, long midline incision, + ventral hernia.  Not tender.  BS active.  No bruits.   Rectal: red blood without mass or tenderness   Musc/Skeltl: no joint swelling or contractures.  Extremities:  No CCE.  Prominent veins and vascular pattern on legs and feet.  Neurologic:  No tremor, no limb weakness, no confusion.  Is HOH Skin:  No rash, no telangectasia/ Tattoos: none Nodes:  No cervical or inguinal adenopathy.    Psych:  Pleasant, relaxed.  Engaged.     LAB RESULTS:  Recent Labs  05/07/13 1030  05/07/13 1750 05/07/13 2135 05/08/13 0455  WBC 6.3  --   --   --  11.5*  HGB 14.9  < > 14.0 14.6 14.5  14.4  HCT 44.3  < > 42.5 43.6 44.2  43.8  PLT 430*  --   --   --  406*  < > = values in this interval not  displayed. BMET Lab Results  Component Value Date   NA 141 05/08/2013   NA 142 05/07/2013   NA 142 07/30/2010   K 5.0 05/08/2013   K 3.9 05/07/2013   K 4.2 07/30/2010   CL 102 05/08/2013   CL 102 05/07/2013   CL 105 07/30/2010   CO2 30 05/08/2013   CO2 25 05/07/2013   CO2 30 07/30/2010   GLUCOSE 89 05/08/2013   GLUCOSE 88 05/07/2013   GLUCOSE 92 07/30/2010   BUN 24* 05/08/2013   BUN 29* 05/07/2013   BUN 32* 07/30/2010   CREATININE 1.71* 05/08/2013   CREATININE 1.72* 05/07/2013   CREATININE 1.65* 07/30/2010   CALCIUM 9.5 05/08/2013   CALCIUM 9.5 05/07/2013   CALCIUM 9.4 07/30/2010   LFT  Recent Labs  05/07/13 1030  PROT 6.8  ALBUMIN 3.6  AST 18  ALT 7  ALKPHOS 51  BILITOT 0.5   PT/INR Lab Results  Component Value Date   INR 0.95 07/29/2010   INR 0.95 11/15/2009   INR 0.96 10/31/2008    Drugs of Abuse     Component Value Date/Time   LABOPIA NEGATIVE 07/29/2010 1557   COCAINSCRNUR NEGATIVE 07/29/2010 1557   LABBENZ NEGATIVE 07/29/2010 1557   AMPHETMU NEGATIVE 07/29/2010 1557     RADIOLOGY STUDIES: Dg Chest Port 1 View 05/07/2013   COMPARISON:  Prior chest x-ray 02/02/2011;  prior CT chest 11/24/2011  FINDINGS: Stable cardiac and mediastinal contours. While there is cardiomegaly, there is also large sliding hiatal hernia with completely intrathoracic stomach. Patient is status post median sternotomy with evidence of prior CABG including LIMA bypass. The lungs are clear. Stable chronic bronchitic changes and mild interstitial prominence. No pleural effusion, pneumothorax or focal consolidation. No pulmonary edema. No acute osseous abnormality.  IMPRESSION: Stable chest x-ray without evidence of active disease.   Electronically Signed   By: Jacqulynn Cadet M.D.   On: 05/07/2013 13:44    ENDOSCOPIC STUDIES: 2010  EGD  Dr Fuller Plan INDICATIONS: melena  ENDOSCOPIC IMPRESSION:  1) Ulcer in the distal esophagus, presumed Mallory Weiss tear  2) Moderate gastritis in the body of the stomach   RECOMMENDATIONS:  1) await pathology results  2) anti-reflux regimen  3) PPI bid for possible GERD related ulcerative esophagitis  4) office visit with Dr. Ardis Hughs in 4 weeks  4) can resume ASA and Plavix as clinically indicated Pathology: STOMACH, BODY, BIOPSY: MILD CHRONIC GASTRITIS, NO EVIDENCE OF HELICOBACTER PYLORI, INTESTINAL METAPLASIA, DYSPLASIA, OR MALIGNANCY IDENTIFIED.   IMPRESSION:   *  Painless rectal bleeding without anemia.  Rule out diverticular bleed, rule out neoplasia.   *  CAD  *  ASPVD  *  Stage 4 CKD, does not want dialysis if it comes to that.     PLAN:     *  Needs colonoscopy but not willing to consent at present.  Perhaps after conferring with MD, she may consent.   *  Clears, stopped IV Protonix. PO Protonix is fine given hx of gastritis ( rare sxs of late).  CBC in AM.    Vena Rua  05/08/2013, 11:00 AM Pager: [place] Attending  I have also seen and assessed the patient and agree with the above note. I suspect hemorrhoidal bleeding. Will try for a sigmoidoscopy (no sedation) and possible hemorrhoid banding tomorrow AM if i can get a spot. If not able and not bleeding will arrange for sigmoidoscopy later in week at [facility] as outpatient.  [name], MD, [facility] [phone] (pager) 05/08/2013 5:37 PM

## 2013-05-12 NOTE — Transfer of Care (Signed)
Immediate Anesthesia Transfer of Care Note  Patient: [name]  Procedure(s) Performed: Procedure(s): COLONOSCOPY (N/A)  Patient Location: PACU  Anesthesia Type:MAC  Level of Consciousness: sedated  Airway & Oxygen Therapy: Patient Spontanous Breathing and Patient connected to nasal cannula oxygen  Post-op Assessment: Report given to PACU RN and Post -op Vital signs reviewed and stable  Post vital signs: Reviewed and stable  Complications: No apparent anesthesia complications

## 2013-05-12 NOTE — Discharge Instructions (Signed)
I found and removed 2 large polyps today. They both looked benign but will be analyzed. You also have diverticulosis and the rectal ulcers. Please use MiraLax daily if you need to to promote regular bowel movements.  Stay off the aspirin until 5/15.  I will let you know pathology results and if to have another routine colonoscopy by mail. Sometimes you need to have a recheck within 6-12 months with the large polyp removed from the ascending colon, but at your age that may not make sense.  I appreciate the opportunity to care for you. [name], MD, FACG  YOU HAD AN ENDOSCOPIC PROCEDURE TODAY: Refer to the procedure report and other information in the discharge instructions given to you for any specific questions about what was found during the examination. If this information does not answer your questions, please call Dr. Celesta Aver office at [phone] to clarify.   YOU SHOULD EXPECT: Some feelings of bloating in the abdomen. Passage of more gas than usual. Walking can help get rid of the air that was put into your GI tract during the procedure and reduce the bloating. If you had a lower endoscopy (such as a colonoscopy or flexible sigmoidoscopy) you may notice spotting of blood in your stool or on the toilet paper. Some abdominal soreness may be present for a day or two, also.  DIET: Your first meal following the procedure should be a light meal and then it is ok to progress to your normal diet. A half-sandwich or bowl of soup is an example of a good first meal. Heavy or fried foods are harder to digest and may make you feel nauseous or bloated. Drink plenty of fluids but you should avoid alcoholic beverages for 24 hours.   ACTIVITY: Your care partner should take you home directly after the procedure. You should plan to take it easy, moving slowly for the rest of the day. You can resume normal activity the day after the procedure however YOU SHOULD NOT DRIVE, use power tools, machinery or  perform tasks that involve climbing or major physical exertion for 24 hours (because of the sedation medicines used during the test).   SYMPTOMS TO REPORT IMMEDIATELY: A gastroenterologist can be reached at any hour. Please call [phone]  for any of the following symptoms:  Following lower endoscopy (colonoscopy, flexible sigmoidoscopy) Excessive amounts of blood (red or black) in the stool - small streaks or amounts of red can happen ordinarily. Significant tenderness, worsening of abdominal pains  Swelling of the abdomen that is new, acute  Fever of 100 or higher   FOLLOW UP:  If any biopsies were taken you will be contacted by phone or by letter within the next 1-3 weeks. Call [phone]  if you have not heard about the biopsies in 3 weeks.  Please also call with any specific questions about appointments or follow up tests.

## 2013-05-12 NOTE — Interval H&P Note (Signed)
History and Physical Interval Note:  05/12/2013 9:32 AM  [name]  has presented today for surgery, with the diagnosis of screening colonosocpy  The various methods of treatment have been discussed with the patient and family. After consideration of risks, benefits and other options for treatment, the patient has consented to  Procedure(s): COLONOSCOPY (N/A) as a surgical intervention .  The patient's history has been reviewed, patient examined, no change in status, stable for surgery.  I have reviewed the patient's chart and labs.  Questions were answered to the patient's satisfaction.     [name]

## 2013-05-12 NOTE — Op Note (Addendum)
[facility] [place], [id number]   COLONOSCOPY PROCEDURE REPORT  PATIENT: [name], [name]  MR#: [id number] BIRTHDATE: 1927-01-20 , 63  yrs. old GENDER: Female ENDOSCOPIST: [name], MD, [facility] PROCEDURE DATE:  05/12/2013 PROCEDURE:   Colonoscopy with snare polypectomy and Colonoscopy with tissue ablation First Screening Colonoscopy - Avg.  risk and is 50 yrs.  old or older - No.  Prior Negative Screening - Now for repeat screening. N/A  History of Adenoma - Now for follow-up colonoscopy & has been > or = to 3 yrs.  N/A  Polyps Removed Today? Yes. ASA CLASS:   Class III INDICATIONS:Rectal Bleeding and rectal polyps seen on flex sig. MEDICATIONS: See Anesthesia Report.  DESCRIPTION OF PROCEDURE:   After the risks benefits and alternatives of the procedure were thoroughly explained, informed consent was obtained.  A digital rectal exam revealed no abnormalities of the rectum.   The Pentax Ped Colon [id number] endoscope was introduced through the anus and advanced to the cecum, which was identified by both the appendix and ileocecal valve. No adverse events experienced.   The quality of the prep was Suprep good  The instrument was then slowly withdrawn as the colon was fully examined.  COLON FINDINGS: A sessile polyp measuring 2.5 cm in size was found in the ascending colon.  A polypectomy was performed using piecemeal snare cautery.  The resection was complete and the polyp tissue was completely retrieved.  Destruction of tissue via ablation was attempted.  Argon plasma coagulation was used to ablate the edges and polypectomy site.   Care was given to ensure that the lumen was suctioned well.   A pedunculated (ling stalk) polyp measuring 2 cm in size was found in the proximal  rectum. It was not photographed but had been 3 days ago.  A polypectomy was performed using snare cautery.  The resection was complete and the polyp tissue was  completely retrieved.   Severe diverticulosis was noted in the sigmoid colon.   A medium sized ulcer was found in the rectum.   The colon mucosa was otherwise normal.   A right colon retroflexion was performed.  Retroflexed views revealed  small internal external hemorrhoids. The time to cecum=4 minutes 0 seconds.  Withdrawal time=26 minutes 0 seconds.  The scope was withdrawn and the procedure completed. COMPLICATIONS: There were no complications.  ENDOSCOPIC IMPRESSION: 1.   Sessile polyp measuring 2.5 cm in size was found in the ascending colon; polypectomy was performed using snare cautery; Destruction of tissue via ablation was performed also 2.   Pedunculated polyp measuring 2 cm in size was found in the rectum; polypectomy was performed using snare cautery 3.   Severe diverticulosis was noted in the sigmoid colon 4.   Ulcers in the rectum - healoing 5.   The colon mucosa was otherwise normal  - good prep. Small internal/external hemorrhoids in rectum  RECOMMENDATIONS: 1.  Await pathology results - no aspirin/NSAIDS x 2 weeks 2.  At her age might not make sense to repeat exam. eSigned:  [name], MD, [facility] 05/12/2013 10:50 AMRevised: 05/12/2013 10:50 AM cc: The Patient    and Dr. Claris Gower

## 2013-05-12 NOTE — Anesthesia Preprocedure Evaluation (Signed)
Anesthesia Evaluation  Patient identified by MRN, date of birth, ID band Patient awake    Reviewed: Allergy & Precautions, H&P , NPO status , Patient's Chart, lab work & pertinent test results  Airway Mallampati: II TM Distance: >3 FB Neck ROM: Full    Dental  (+) Edentulous Upper, Dental Advisory Given   Pulmonary COPDformer smoker,  breath sounds clear to auscultation  Pulmonary exam normal       Cardiovascular hypertension, Pt. on medications and Pt. on home beta blockers + CAD, + Past MI, + CABG, + Peripheral Vascular Disease and +CHF + Valvular Problems/Murmurs Rhythm:Regular Rate:Normal + Systolic murmurs a. 03/8180 Inf STEMI->RCA BMS;  b. 09/2008 CABGx2: LIMA->LAD, RIMA->OM as Y from LIMA;  b. 06/2011 MV EF 31%, bsaal to dist ant infarct, inf/inflat infarct with mild to mod peri-infarct ischemia (Diablock   Neuro/Psych  Neuromuscular disease negative psych ROS   GI/Hepatic Neg liver ROS, hiatal hernia, PUD,   Endo/Other  negative endocrine ROS  Renal/GU Renal disease  negative genitourinary   Musculoskeletal negative musculoskeletal ROS (+)   Abdominal   Peds negative pediatric ROS (+)  Hematology  (+) anemia ,   Anesthesia Other Findings   Reproductive/Obstetrics                           Anesthesia Physical Anesthesia Plan  ASA: III  Anesthesia Plan: MAC   Post-op Pain Management:    Induction: Intravenous  Airway Management Planned: Simple Face Mask  Additional Equipment:   Intra-op Plan:   Post-operative Plan:   Informed Consent: I have reviewed the patients History and Physical, chart, labs and discussed the procedure including the risks, benefits and alternatives for the proposed anesthesia with the patient or authorized representative who has indicated his/her understanding and acceptance.   Dental advisory given  Plan Discussed with: CRNA  Anesthesia Plan  Comments:         Anesthesia Quick Evaluation

## 2013-05-12 NOTE — Anesthesia Postprocedure Evaluation (Signed)
Anesthesia Post Note  Patient: [name]  Procedure(s) Performed: Procedure(s) (LRB): COLONOSCOPY (N/A)  Anesthesia type: MAC  Patient location: PACU  Post pain: Pain level controlled  Post assessment: Post-op Vital signs reviewed  Last Vitals:  Filed Vitals:   05/12/13 1100  BP:   Pulse: 67  Temp:   Resp: 18    Post vital signs: Reviewed  Level of consciousness: sedated  Complications: No apparent anesthesia complications

## 2013-05-12 NOTE — Progress Notes (Signed)
Quick Note:  Patient aware. ______ 

## 2013-05-15 ENCOUNTER — Encounter (HOSPITAL_COMMUNITY): Payer: Self-pay | Admitting: Internal Medicine

## 2013-05-17 ENCOUNTER — Encounter: Payer: Self-pay | Admitting: Internal Medicine

## 2013-05-17 NOTE — Progress Notes (Signed)
Quick Note:  Tubular adenomas x 2 (large) No recall due to age/co-morbidities ______

## 2013-05-23 ENCOUNTER — Other Ambulatory Visit: Payer: Self-pay | Admitting: Cardiovascular Disease

## 2013-06-23 ENCOUNTER — Other Ambulatory Visit: Payer: Self-pay | Admitting: Cardiovascular Disease

## 2013-07-21 ENCOUNTER — Encounter: Payer: Self-pay | Admitting: Cardiovascular Disease

## 2013-07-21 ENCOUNTER — Ambulatory Visit (INDEPENDENT_AMBULATORY_CARE_PROVIDER_SITE_OTHER): Payer: Medicare Other | Admitting: Cardiovascular Disease

## 2013-07-21 VITALS — BP 140/68 | HR 84 | Ht 66.5 in | Wt 141.0 lb

## 2013-07-21 DIAGNOSIS — I2589 Other forms of chronic ischemic heart disease: Secondary | ICD-10-CM

## 2013-07-21 DIAGNOSIS — Z87891 Personal history of nicotine dependence: Secondary | ICD-10-CM

## 2013-07-21 DIAGNOSIS — I10 Essential (primary) hypertension: Secondary | ICD-10-CM

## 2013-07-21 DIAGNOSIS — E7800 Pure hypercholesterolemia, unspecified: Secondary | ICD-10-CM

## 2013-07-21 DIAGNOSIS — I2510 Atherosclerotic heart disease of native coronary artery without angina pectoris: Secondary | ICD-10-CM

## 2013-07-21 DIAGNOSIS — I779 Disorder of arteries and arterioles, unspecified: Secondary | ICD-10-CM

## 2013-07-21 DIAGNOSIS — I080 Rheumatic disorders of both mitral and aortic valves: Secondary | ICD-10-CM

## 2013-07-21 DIAGNOSIS — I739 Peripheral vascular disease, unspecified: Secondary | ICD-10-CM

## 2013-07-21 DIAGNOSIS — I255 Ischemic cardiomyopathy: Secondary | ICD-10-CM

## 2013-07-21 DIAGNOSIS — F17201 Nicotine dependence, unspecified, in remission: Secondary | ICD-10-CM

## 2013-07-21 MED ORDER — PRAVASTATIN SODIUM 80 MG PO TABS: ORAL_TABLET | ORAL | Status: DC

## 2013-07-21 NOTE — Progress Notes (Signed)
History of Present Illness: 80 yo female with history of CAD, ischemic cardiomyopathy, chronic systolic CHF, HTN, HLD, tobacco abuse, COPD, CKD, PAD, mitral regurgitation who is here today for cardiac follow up. She has been followed in the past by Dr. Lia Foyer. She had an inferior MI in July 2010 treated with a bare metal stent in the RCA. CABG September 2010 with LIMA to LAD and free RIMA Y graft to OM off of LIMA. She also had aortic root replacement at that time secondary to dilated aortic root. She is known to have severe PAD. Last carotid artery dopplers 09/04/12 with moderate 60-79% bilateral stenosis. She is followed in Nephrology by Dr. Marval Regal and her renal function is stable. Last echo June 2013 in [place] with LVEF 40%, moderate MR.   She has been doing well. No chest pain or SOB. Weight is stable. She no longer smokes.   Primary Care Physician:  Claris Gower  Last Lipid Profile:Lipid Panel     Component Value Date/Time   CHOL 205* 12/26/2012 0943   TRIG 158.0* 12/26/2012 0943   HDL 45.10 12/26/2012 0943   CHOLHDL 5 12/26/2012 0943   VLDL 31.6 12/26/2012 0943   LDLCALC 80 02/02/2012 1637     Past Medical History  Diagnosis Date  . CAD (coronary artery disease)     a. 07/2008 Inf STEMI->RCA BMS;  b. 09/2008 CABGx2: LIMA->LAD, RIMA->OM as Y from LIMA;  b. 06/2011 MV EF 31%, bsaal to dist ant infarct, inf/inflat infarct with mild to mod peri-infarct ischemia [facility]).  . Ischemic cardiomyopathy     a. 06/2011 Echo: EF 40%, inf/inflat AK and lat HK, aortic sclerosis w/ mild AI, Mild to mod MR (prev mod-sev), Gr 1 DD, mild TR, RVSP 81mmHg  . Systolic CHF, chronic     a. 06/2011 EF 40%  . Hypertension   . Hyperlipidemia   . Hiatal hernia     large  . Tobacco abuse   . Cerebrovascular disease   . COPD (chronic obstructive pulmonary disease)     presumed   . Arterial disease     peripheral arterial disease with claudication - bilateral SFA disease with ABI's  0.4-0.5 bilaterally   . Mitral regurgitation     a. prev Mod-Sev, read as mild to mod by echo 06/2011 [facility]).  . CKD (chronic kidney disease), stage IV     born with 1 kidney  . MI (myocardial infarction) 2010  . Thyroid nodule 10/2012    left dominant nodule cytology:non-neoplastic goiter.   . Cataracts, both eyes     not surgical  . Personal history of colonic polyps - adenomas 05/09/2013    Past Surgical History  Procedure Laterality Date  . Laprotomy for bowel obstruction    . Coronary artery bypass graft  09/2008    aortic thoraic anuerysm resecton 2010.   Marland Kitchen Coronary stent placement  07/2008    BMS to RCA  . Flexible sigmoidoscopy N/A 05/09/2013    Procedure: FLEXIBLE SIGMOIDOSCOPY;  Surgeon: [name], MD;  Location: [facility];  Service: Endoscopy;  Laterality: N/A;  with possible hemorrhoid banding use gastrosope unsdeated  . Appendectomy    . Oophorectomy      one removed in 70's  . Hemorrhoid surgery    . Colonoscopy N/A 05/12/2013    Procedure: COLONOSCOPY;  Surgeon: [name], MD;  Location: [facility];  Service: Endoscopy;  Laterality: N/A;    Current Outpatient Prescriptions  Medication Sig  Dispense Refill  . amLODipine (NORVASC) 10 MG tablet Take 10 mg by mouth daily after lunch.       Marland Kitchen aspirin 81 MG tablet Take 1 tablet (81 mg total) by mouth daily. STOP AND RESTART May 26, 2013  30 tablet    . Coenzyme Q-10 100 MG capsule Take 100 mg by mouth daily.        . furosemide (LASIX) 20 MG tablet Take 20 mg by mouth daily after lunch.       . Melatonin 5 MG TABS Take 15 mg by mouth at bedtime as needed (sleep).      . metoprolol tartrate (LOPRESSOR) 25 MG tablet Take 25 mg by mouth 2 (two) times daily.      . nitroGLYCERIN (NITROSTAT) 0.4 MG SL tablet Place 1 tablet (0.4 mg total) under the tongue every 5 (five) minutes as needed.  25 tablet  3  . polyethylene glycol (MIRALAX / GLYCOLAX) packet Take 17 g by mouth daily as needed for mild  constipation.  100 each  0  . pravastatin (PRAVACHOL) 80 MG tablet TAKE ONE TABLET BY MOUTH ONCE DAILY IN THE EVENING  30 tablet  0  . PROAIR HFA 108 (90 BASE) MCG/ACT inhaler        No current facility-administered medications for this visit.    Allergies  Allergen Reactions  . Fish Oil     Break out in rash    History   Social History  . Marital Status: Widowed    Spouse Name: N/A    Number of Children: Y  . Years of Education: N/A   Occupational History  . 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    Social History Main Topics  . Smoking status: Former Smoker -- 1.00 packs/day for 60 years    Types: Cigarettes    Quit date: 07/12/2008  . Smokeless tobacco: Never Used  . Alcohol Use: No  . Drug Use: No  . Sexual Activity: Not on file   Other Topics Concern  . Not on file   Social History Narrative   She lives in [place]. She is retired and widowed.     Family History  Problem Relation Age of Onset  . Coronary artery disease Mother     extensive family history; several other family members.  . Heart disease Mother     CHF  . Coronary artery disease Brother   . Leukemia Sister   . Leukemia Sister   . Leukemia Father   . Colon cancer Neg Hx   . Pancreatic cancer Neg Hx   . Stomach cancer Neg Hx   . Rectal cancer Neg Hx     Review of Systems:  As stated in the HPI and otherwise negative.   BP 140/68  Pulse 84  Ht 5' 6.5" (1.689 m)  Wt 141 lb (63.957 kg)  BMI 22.42 kg/m2  Physical Examination: General: Well developed, well nourished, NAD HEENT: OP clear, mucus membranes moist SKIN: warm, dry. No rashes. Neuro: No focal deficits Musculoskeletal: Muscle strength 5/5 all ext Psychiatric: Mood and affect normal Neck: No JVD, no carotid bruits, no thyromegaly, no lymphadenopathy. Lungs:Clear bilaterally, no wheezes, rhonci, crackles Cardiovascular: Regular rate and rhythm. Systolic murmur. No gallops or rubs. Abdomen:Soft. Bowel sounds  present. Non-tender.  Extremities: No lower extremity edema. Pulses are 2 + in the bilateral DP/PT.  Assessment and Plan:   1. CAROTID ARTERY STENOSIS: She has bilateral disease. Stable by dopplers August 2014. Repeat August 2015.  Does not wish to f/u in [facility].     2. CKD:  Being followed by nephrology. Refuses to consider dialysis  3. CAD: Stable. Continue current therapy.   4. Tobacco abuse, in remission: Not smoking.   5. HYPERCHOLESTEROLEMIA: She had been off of statins and was worried that Crestor was leading to her scalp issues. She is tolerating Pravastatin.      6. HYPERTENSION:  Reasonably well controlled.   No changes today  7. MITRAL REGURGITATION: Moderate by echo at [facility] June 2013 (scanned into EPIC).     8. Cardiomyopathy, ischemic: Overall stable.  Last LVEF 40% by echo June 2013 at [facility].

## 2013-07-21 NOTE — Patient Instructions (Addendum)

## 2013-07-22 ENCOUNTER — Other Ambulatory Visit: Payer: Self-pay | Admitting: Cardiovascular Disease

## 2013-08-22 ENCOUNTER — Other Ambulatory Visit: Payer: Self-pay | Admitting: Cardiovascular Disease

## 2013-08-31 ENCOUNTER — Ambulatory Visit (HOSPITAL_COMMUNITY): Payer: Medicare Other | Attending: Cardiovascular Disease | Admitting: Cardiology

## 2013-08-31 DIAGNOSIS — I739 Peripheral vascular disease, unspecified: Secondary | ICD-10-CM

## 2013-08-31 DIAGNOSIS — I6529 Occlusion and stenosis of unspecified carotid artery: Secondary | ICD-10-CM | POA: Diagnosis present

## 2013-08-31 DIAGNOSIS — I779 Disorder of arteries and arterioles, unspecified: Secondary | ICD-10-CM

## 2013-08-31 NOTE — Progress Notes (Signed)
Carotid duplex performed 

## 2013-09-04 ENCOUNTER — Telehealth: Payer: Self-pay | Admitting: Cardiovascular Disease

## 2013-09-04 NOTE — Telephone Encounter (Signed)
Follow up:    Per pt returning this office's call please give her a call back.

## 2013-09-04 NOTE — Telephone Encounter (Signed)
Spoke with pt and reviewed carotid doppler results with her. She reports she has had spells off and on over the last few months where she feels dizzy and has a "funny feeling" like she might pass out. Reports these are not regular. Has not fainted. Occurred one time while pushing a shopping cart. She stopped and felt better. Another time she ate and felt better. I asked pt to follow up with primary care for evaluation of this.

## 2013-09-12 ENCOUNTER — Emergency Department (HOSPITAL_COMMUNITY)
Admission: EM | Admit: 2013-09-12 | Discharge: 2013-09-12 | Disposition: A | Discharge status: Home / Self Care | Payer: Medicare Other | Attending: Emergency Medicine | Admitting: Emergency Medicine

## 2013-09-12 ENCOUNTER — Emergency Department (HOSPITAL_COMMUNITY): Payer: Medicare Other

## 2013-09-12 ENCOUNTER — Encounter (HOSPITAL_COMMUNITY): Payer: Self-pay | Admitting: Emergency Medicine

## 2013-09-12 DIAGNOSIS — Z9861 Coronary angioplasty status: Secondary | ICD-10-CM | POA: Insufficient documentation

## 2013-09-12 DIAGNOSIS — Z8639 Personal history of other endocrine, nutritional and metabolic disease: Secondary | ICD-10-CM | POA: Insufficient documentation

## 2013-09-12 DIAGNOSIS — I252 Old myocardial infarction: Secondary | ICD-10-CM | POA: Insufficient documentation

## 2013-09-12 DIAGNOSIS — I2510 Atherosclerotic heart disease of native coronary artery without angina pectoris: Secondary | ICD-10-CM | POA: Diagnosis not present

## 2013-09-12 DIAGNOSIS — Z87891 Personal history of nicotine dependence: Secondary | ICD-10-CM | POA: Diagnosis not present

## 2013-09-12 DIAGNOSIS — N184 Chronic kidney disease, stage 4 (severe): Secondary | ICD-10-CM | POA: Diagnosis not present

## 2013-09-12 DIAGNOSIS — Z7982 Long term (current) use of aspirin: Secondary | ICD-10-CM | POA: Insufficient documentation

## 2013-09-12 DIAGNOSIS — Z862 Personal history of diseases of the blood and blood-forming organs and certain disorders involving the immune mechanism: Secondary | ICD-10-CM | POA: Insufficient documentation

## 2013-09-12 DIAGNOSIS — I129 Hypertensive chronic kidney disease with stage 1 through stage 4 chronic kidney disease, or unspecified chronic kidney disease: Secondary | ICD-10-CM | POA: Diagnosis not present

## 2013-09-12 DIAGNOSIS — J449 Chronic obstructive pulmonary disease, unspecified: Secondary | ICD-10-CM | POA: Diagnosis not present

## 2013-09-12 DIAGNOSIS — Z8719 Personal history of other diseases of the digestive system: Secondary | ICD-10-CM | POA: Diagnosis not present

## 2013-09-12 DIAGNOSIS — Z79899 Other long term (current) drug therapy: Secondary | ICD-10-CM | POA: Diagnosis not present

## 2013-09-12 DIAGNOSIS — R55 Syncope and collapse: Secondary | ICD-10-CM | POA: Insufficient documentation

## 2013-09-12 DIAGNOSIS — Z8669 Personal history of other diseases of the nervous system and sense organs: Secondary | ICD-10-CM | POA: Diagnosis not present

## 2013-09-12 DIAGNOSIS — Z860100 Personal history of colon polyps, unspecified: Secondary | ICD-10-CM | POA: Insufficient documentation

## 2013-09-12 DIAGNOSIS — R42 Dizziness and giddiness: Secondary | ICD-10-CM

## 2013-09-12 DIAGNOSIS — J4489 Other specified chronic obstructive pulmonary disease: Secondary | ICD-10-CM | POA: Insufficient documentation

## 2013-09-12 DIAGNOSIS — I5022 Chronic systolic (congestive) heart failure: Secondary | ICD-10-CM | POA: Insufficient documentation

## 2013-09-12 DIAGNOSIS — N390 Urinary tract infection, site not specified: Secondary | ICD-10-CM | POA: Insufficient documentation

## 2013-09-12 LAB — URINALYSIS, ROUTINE W REFLEX MICROSCOPIC
Bilirubin Urine: NEGATIVE
GLUCOSE, UA: NEGATIVE mg/dL
Ketones, ur: NEGATIVE mg/dL
Nitrite: NEGATIVE
Protein, ur: 30 mg/dL — AB
Specific Gravity, Urine: 1.01 (ref 1.005–1.030)
UROBILINOGEN UA: 0.2 mg/dL (ref 0.0–1.0)
pH: 6.5 (ref 5.0–8.0)

## 2013-09-12 LAB — DIFFERENTIAL
BASOS PCT: 1 % (ref 0–1)
Basophils Absolute: 0 10*3/uL (ref 0.0–0.1)
Eosinophils Absolute: 0.1 10*3/uL (ref 0.0–0.7)
Eosinophils Relative: 1 % (ref 0–5)
LYMPHS ABS: 1.6 10*3/uL (ref 0.7–4.0)
LYMPHS PCT: 19 % (ref 12–46)
MONOS PCT: 8 % (ref 3–12)
Monocytes Absolute: 0.6 10*3/uL (ref 0.1–1.0)
NEUTROS PCT: 71 % (ref 43–77)
Neutro Abs: 6 10*3/uL (ref 1.7–7.7)

## 2013-09-12 LAB — CBC
HCT: 48.1 % — ABNORMAL HIGH (ref 36.0–46.0)
HEMOGLOBIN: 16.2 g/dL — AB (ref 12.0–15.0)
MCH: 29.8 pg (ref 26.0–34.0)
MCHC: 33.7 g/dL (ref 30.0–36.0)
MCV: 88.4 fL (ref 78.0–100.0)
Platelets: 433 10*3/uL — ABNORMAL HIGH (ref 150–400)
RBC: 5.44 MIL/uL — AB (ref 3.87–5.11)
RDW: 14.3 % (ref 11.5–15.5)
WBC: 8.3 10*3/uL (ref 4.0–10.5)

## 2013-09-12 LAB — COMPREHENSIVE METABOLIC PANEL
ALBUMIN: 3.9 g/dL (ref 3.5–5.2)
ALT: 8 U/L (ref 0–35)
ANION GAP: 13 (ref 5–15)
AST: 25 U/L (ref 0–37)
Alkaline Phosphatase: 62 U/L (ref 39–117)
BUN: 23 mg/dL (ref 6–23)
CHLORIDE: 101 meq/L (ref 96–112)
CO2: 26 mEq/L (ref 19–32)
Calcium: 10.1 mg/dL (ref 8.4–10.5)
Creatinine, Ser: 1.52 mg/dL — ABNORMAL HIGH (ref 0.50–1.10)
GFR calc Af Amer: 35 mL/min — ABNORMAL LOW (ref >90)
GFR calc non Af Amer: 30 mL/min — ABNORMAL LOW (ref >90)
GLUCOSE: 118 mg/dL — AB (ref 70–99)
POTASSIUM: 4.7 meq/L (ref 3.7–5.3)
Sodium: 140 mEq/L (ref 137–147)
Total Bilirubin: 0.5 mg/dL (ref 0.3–1.2)
Total Protein: 7.4 g/dL (ref 6.0–8.3)

## 2013-09-12 LAB — I-STAT CHEM 8, ED
BUN: 24 mg/dL — AB (ref 6–23)
CALCIUM ION: 1.2 mmol/L (ref 1.13–1.30)
Chloride: 105 mEq/L (ref 96–112)
Creatinine, Ser: 1.6 mg/dL — ABNORMAL HIGH (ref 0.50–1.10)
GLUCOSE: 119 mg/dL — AB (ref 70–99)
HCT: 53 % — ABNORMAL HIGH (ref 36.0–46.0)
Hemoglobin: 18 g/dL — ABNORMAL HIGH (ref 12.0–15.0)
Potassium: 4.5 mEq/L (ref 3.7–5.3)
Sodium: 137 mEq/L (ref 137–147)
TCO2: 26 mmol/L (ref 0–100)

## 2013-09-12 LAB — PROTIME-INR
INR: 1 (ref 0.00–1.49)
Prothrombin Time: 13.2 seconds (ref 11.6–15.2)

## 2013-09-12 LAB — RAPID URINE DRUG SCREEN, HOSP PERFORMED
AMPHETAMINES: NOT DETECTED
Barbiturates: NOT DETECTED
Benzodiazepines: NOT DETECTED
Cocaine: NOT DETECTED
Opiates: NOT DETECTED
Tetrahydrocannabinol: NOT DETECTED

## 2013-09-12 LAB — URINE MICROSCOPIC-ADD ON

## 2013-09-12 LAB — ETHANOL: Alcohol, Ethyl (B): <11 mg/dL (ref 0–11)

## 2013-09-12 LAB — I-STAT TROPONIN, ED: Troponin i, poc: 0.01 ng/mL (ref 0.00–0.08)

## 2013-09-12 LAB — APTT: APTT: 32 s (ref 24–37)

## 2013-09-12 MED ORDER — CEPHALEXIN 500 MG PO CAPS: 500.0000 mg | ORAL_CAPSULE | Freq: Four times a day (QID) | ORAL | Status: DC

## 2013-09-12 NOTE — ED Provider Notes (Signed)
CSN: [id number]     Arrival date & time 09/12/13  1329 History   First MD Initiated Contact with Patient 09/12/13 1334     Chief Complaint  Patient presents with  . Near Syncope     (Consider location/radiation/quality/duration/timing/severity/associated sxs/prior Treatment) HPI Comments: Patient is an 80 year old female with a past medical history of CAD, ischemic cardiomyopathy, chronic systolic heart failure, hypertension, hyperlipidemia, COPD, mitral regurgitation, chronic kidney disease, MI and a thyroid nodule who presents to the emergency department via EMS from home after an episode of dizziness. Patient states she was sitting in the living room holding her coffee cup when she went to get up to go to the kitchen, and the next thing she knew she was on the other side of the kitchen. She felt slightly dizzy at the time which immediately subsided, however she cannot recall walking into the kitchen. Denies falling. Prior to this episode she was feeling fine. She reports over the past 6 months she has had a few episodes of dizziness, however has not mentioned this to her primary care physician until she call to schedule appointment this week. She describes the dizziness as a "swimmy" feeling in her head. Currently she is asymptomatic. Denies chest pain, shortness of breath, vision change, confusion, lightheadedness, dizziness, headache, numbness, tingling or weakness. No history of stroke.  Patient is a 80 y.o. female presenting with near-syncope. The history is provided by the patient and a relative.  Near Syncope    Past Medical History  Diagnosis Date  . CAD (coronary artery disease)     a. 07/2008 Inf STEMI->RCA BMS;  b. 09/2008 CABGx2: LIMA->LAD, RIMA->OM as Y from LIMA;  b. 06/2011 MV EF 31%, bsaal to dist ant infarct, inf/inflat infarct with mild to mod peri-infarct ischemia [facility]  . Ischemic cardiomyopathy     a. 06/2011 Echo: EF 40%, inf/inflat AK and lat HK, aortic  sclerosis w/ mild AI, Mild to mod MR (prev mod-sev), Gr 1 DD, mild TR, RVSP 96mmHg  . Systolic CHF, chronic     a. 06/2011 EF 40%  . Hypertension   . Hyperlipidemia   . Hiatal hernia     large  . Tobacco abuse   . Cerebrovascular disease   . COPD (chronic obstructive pulmonary disease)     presumed   . Arterial disease     peripheral arterial disease with claudication - bilateral SFA disease with ABI's 0.4-0.5 bilaterally   . Mitral regurgitation     a. prev Mod-Sev, read as mild to mod by echo 06/2011 [facility]).  . CKD (chronic kidney disease), stage IV     born with 1 kidney  . MI (myocardial infarction) 2010  . Thyroid nodule 10/2012    left dominant nodule cytology:non-neoplastic goiter.   . Cataracts, both eyes     not surgical  . Personal history of colonic polyps - adenomas 05/09/2013   Past Surgical History  Procedure Laterality Date  . Laprotomy for bowel obstruction    . Coronary artery bypass graft  09/2008    aortic thoraic anuerysm resecton 2010.   Marland Kitchen Coronary stent placement  07/2008    BMS to RCA  . Flexible sigmoidoscopy N/A 05/09/2013    Procedure: FLEXIBLE SIGMOIDOSCOPY;  Surgeon: [name], MD;  Location: [place];  Service: Endoscopy;  Laterality: N/A;  with possible hemorrhoid banding use gastrosope unsdeated  . Appendectomy    . Oophorectomy      one removed in 70's  .  Hemorrhoid surgery    . Colonoscopy N/A 05/12/2013    Procedure: COLONOSCOPY;  Surgeon: [name], MD;  Location: [facility];  Service: Endoscopy;  Laterality: N/A;   Family History  Problem Relation Age of Onset  . Coronary artery disease Mother     extensive family history; several other family members.  . Heart disease Mother     CHF  . Coronary artery disease Brother   . Leukemia Sister   . Leukemia Sister   . Leukemia Father   . Colon cancer Neg Hx   . Pancreatic cancer Neg Hx   . Stomach cancer Neg Hx   . Rectal cancer Neg Hx    History  Substance  Use Topics  . Smoking status: Former Smoker -- 1.00 packs/day for 60 years    Types: Cigarettes    Quit date: 07/12/2008  . Smokeless tobacco: Never Used  . Alcohol Use: No   OB History   Grav Para Term Preterm Abortions TAB SAB Ect Mult Living                 Review of Systems  Cardiovascular: Positive for near-syncope.  Neurological: Positive for dizziness.       + near syncope  All other systems reviewed and are negative.     Allergies  Review of patient's allergies indicates no known allergies.  Home Medications   Prior to Admission medications   Medication Sig Start Date End Date Taking? Authorizing Provider  amLODipine (NORVASC) 10 MG tablet Take 10 mg by mouth daily.   Yes Historical Provider, MD  aspirin EC 81 MG tablet Take 81 mg by mouth daily.   Yes Historical Provider, MD  Coenzyme Q-10 100 MG capsule Take 100 mg by mouth daily.     Yes Historical Provider, MD  furosemide (LASIX) 20 MG tablet Take 20 mg by mouth daily.   Yes Historical Provider, MD  Melatonin 5 MG TABS Take 15 mg by mouth at bedtime as needed (sleep).   Yes Historical Provider, MD  metoprolol tartrate (LOPRESSOR) 25 MG tablet Take 25 mg by mouth 2 (two) times daily.   Yes Historical Provider, MD  pravastatin (PRAVACHOL) 80 MG tablet TAKE ONE TABLET BY MOUTH ONCE DAILY IN THE EVENING 07/21/13  Yes Burnell Blanks, MD  nitroGLYCERIN (NITROSTAT) 0.4 MG SL tablet Place 1 tablet (0.4 mg total) under the tongue every 5 (five) minutes as needed. 07/14/11   Rogelia Mire, NP   BP 151/59  Pulse 65  Temp(Src) 98.2 F (36.8 C) (Oral)  Resp 22  SpO2 95% Physical Exam  Nursing note and vitals reviewed. Constitutional: She is oriented to person, place, and time. She appears well-developed and well-nourished. No distress.  HENT:  Head: Normocephalic and atraumatic.  Mouth/Throat: Oropharynx is clear and moist.  Eyes: Conjunctivae and EOM are normal. Pupils are equal, round, and reactive to  light.  Neck: Normal range of motion. Neck supple. No JVD present.  Cardiovascular: Normal rate, regular rhythm, normal heart sounds and intact distal pulses.   No extremity edema.  Pulmonary/Chest: Effort normal and breath sounds normal. No respiratory distress.  Abdominal: Soft. Bowel sounds are normal. There is no tenderness.  Musculoskeletal: Normal range of motion. She exhibits no edema.  Neurological: She is alert and oriented to person, place, and time. She has normal strength. No cranial nerve deficit or sensory deficit. She displays a negative Romberg sign. Coordination normal. GCS eye subscore is 4. GCS verbal subscore is 5.  GCS motor subscore is 6.  Speech fluent, goal oriented. Moves limbs without ataxia. Equal grip strength bilateral. Normal gait.  Skin: Skin is warm and dry. She is not diaphoretic.  Psychiatric: She has a normal mood and affect. Her behavior is normal.    ED Course  Procedures (including critical care time) Labs Review Labs Reviewed  CBC - Abnormal; Notable for the following:    RBC 5.44 (*)    Hemoglobin 16.2 (*)    HCT 48.1 (*)    Platelets 433 (*)    All other components within normal limits  I-STAT CHEM 8, ED - Abnormal; Notable for the following:    BUN 24 (*)    Creatinine, Ser 1.60 (*)    Glucose, Bld 119 (*)    Hemoglobin 18.0 (*)    HCT 53.0 (*)    All other components within normal limits  PROTIME-INR  APTT  DIFFERENTIAL  ETHANOL  COMPREHENSIVE METABOLIC PANEL  URINE RAPID DRUG SCREEN (HOSP PERFORMED)  URINALYSIS, ROUTINE W REFLEX MICROSCOPIC  I-STAT TROPOININ, ED  I-STAT TROPOININ, ED    Imaging Review Ct Head Wo Contrast  09/12/2013   CLINICAL DATA:  Near syncope  EXAM: CT HEAD WITHOUT CONTRAST  TECHNIQUE: Contiguous axial images were obtained from the base of the skull through the vertex without intravenous contrast.  COMPARISON:  None.  FINDINGS: There is mild age appropriate diffuse cerebral and cerebellar atrophy. The  ventricles are normal in size and position. There is fairly extensive decreased density in the deep white matter of both cerebral hemispheres consistent with chronic small vessel ischemic change. The cerebellum and brainstem are normal.  The observed paranasal sinuses are clear with exception of right sphenoid sinus cells where there is mucoperiosteal thickening and a small amount of fluid. The mastoid air cells are clear. There is no acute skull fracture. There is calcification in the soft tissues in the posterior parietal scalp which may reflect a sebaceous cyst.  IMPRESSION: 1. There is no acute ischemic or hemorrhagic event. There is no intracranial mass nor hydrocephalus. 2. There is extensive white matter hypodensity consistent with chronic small vessel ischemic change. 3. There is likely both acute and chronic sphenoid sinus inflammation.   Electronically Signed   By: David  Martinique   On: 09/12/2013 14:11     EKG Interpretation None      MDM   Final diagnoses:  Dizziness  UTI (lower urinary tract infection)   Patient presenting after an episode of what sounds like amnesia. She is nontoxic appearing and in no apparent distress. Afebrile, vital signs stable. Unremarkable neurologic examination. Ambulating without difficulty. She states she has had episodes of near-syncope in the past and this does not feel the same. Workup negative. Laps without any acute finding. CT head without any acute finding. I spoke with Dr. Doy Mince, neurology who will evaluate patient, however based on patient's story and stated physical exam findings, she does not feel admission will be necessary as she is at baseline.  Pt signed out to [employer], PA-C at shift change. Awaiting neuro consult.  Case discussed with attending Dr. Colin Rhein who also evaluated patient and agrees with plan of care.   Illene Labrador, PA-C 09/13/13 [phone]

## 2013-09-12 NOTE — Discharge Instructions (Signed)
Take antibiotic to completion for the urinary tract infection. Follow up with your primary care doctor.  Dizziness Dizziness is a common problem. It is a feeling of unsteadiness or light-headedness. You may feel like you are about to faint. Dizziness can lead to injury if you stumble or fall. A person of any age group can suffer from dizziness, but dizziness is more common in older adults. CAUSES  Dizziness can be caused by many different things, including:  Middle ear problems.  Standing for too long.  Infections.  An allergic reaction.  Aging.  An emotional response to something, such as the sight of blood.  Side effects of medicines.  Tiredness.  Problems with circulation or blood pressure.  Excessive use of alcohol or medicines, or illegal drug use.  Breathing too fast (hyperventilation).  An irregular heart rhythm (arrhythmia).  A low red blood cell count (anemia).  Pregnancy.  Vomiting, diarrhea, fever, or other illnesses that cause body fluid loss (dehydration).  Diseases or conditions such as Parkinson's disease, high blood pressure (hypertension), diabetes, and thyroid problems.  Exposure to extreme heat. DIAGNOSIS  Your health care provider will ask about your symptoms, perform a physical exam, and perform an electrocardiogram (ECG) to record the electrical activity of your heart. Your health care provider may also perform other heart or blood tests to determine the cause of your dizziness. These may include:  Transthoracic echocardiogram (TTE). During echocardiography, sound waves are used to evaluate how blood flows through your heart.  Transesophageal echocardiogram (TEE).  Cardiac monitoring. This allows your health care provider to monitor your heart rate and rhythm in real time.  Holter monitor. This is a portable device that records your heartbeat and can help diagnose heart arrhythmias. It allows your health care provider to track your heart activity  for several days if needed.  Stress tests by exercise or by giving medicine that makes the heart beat faster. TREATMENT  Treatment of dizziness depends on the cause of your symptoms and can vary greatly. HOME CARE INSTRUCTIONS   Drink enough fluids to keep your urine clear or pale yellow. This is especially important in very hot weather. In older adults, it is also important in cold weather.  Take your medicine exactly as directed if your dizziness is caused by medicines. When taking blood pressure medicines, it is especially important to get up slowly.  Rise slowly from chairs and steady yourself until you feel okay.  In the morning, first sit up on the side of the bed. When you feel okay, stand slowly while holding onto something until you know your balance is fine.  Move your legs often if you need to stand in one place for a long time. Tighten and relax your muscles in your legs while standing.  Have someone stay with you for 1-2 days if dizziness continues to be a problem. Do this until you feel you are well enough to stay alone. Have the person call your health care provider if he or she notices changes in you that are concerning.  Do not drive or use heavy machinery if you feel dizzy.  Do not drink alcohol. SEEK IMMEDIATE MEDICAL CARE IF:   Your dizziness or light-headedness gets worse.  You feel nauseous or vomit.  You have problems talking, walking, or using your arms, hands, or legs.  You feel weak.  You are not thinking clearly or you have trouble forming sentences. It may take a friend or family member to notice this.  You  have chest pain, abdominal pain, shortness of breath, or sweating.  Your vision changes.  You notice any bleeding.  You have side effects from medicine that seems to be getting worse rather than better. MAKE SURE YOU:   Understand these instructions.  Will watch your condition.  Will get help right away if you are not doing well or get  worse. Document Released: 06/24/2000 Document Revised: 01/03/2013 Document Reviewed: 07/18/2010 [facility] Patient Information 2015 [place], [place]. This information is not intended to replace advice given to you by your health care provider. Make sure you discuss any questions you have with your health care provider.  Urinary Tract Infection Urinary tract infections (UTIs) can develop anywhere along your urinary tract. Your urinary tract is your body's drainage system for removing wastes and extra water. Your urinary tract includes two kidneys, two ureters, a bladder, and a urethra. Your kidneys are a pair of bean-shaped organs. Each kidney is about the size of your fist. They are located below your ribs, one on each side of your spine. CAUSES Infections are caused by microbes, which are microscopic organisms, including fungi, viruses, and bacteria. These organisms are so small that they can only be seen through a microscope. Bacteria are the microbes that most commonly cause UTIs. SYMPTOMS  Symptoms of UTIs may vary by age and gender of the patient and by the location of the infection. Symptoms in young women typically include a frequent and intense urge to urinate and a painful, burning feeling in the bladder or urethra during urination. Older women and men are more likely to be tired, shaky, and weak and have muscle aches and abdominal pain. A fever may mean the infection is in your kidneys. Other symptoms of a kidney infection include pain in your back or sides below the ribs, nausea, and vomiting. DIAGNOSIS To diagnose a UTI, your caregiver will ask you about your symptoms. Your caregiver also will ask to provide a urine sample. The urine sample will be tested for bacteria and white blood cells. White blood cells are made by your body to help fight infection. TREATMENT  Typically, UTIs can be treated with medication. Because most UTIs are caused by a bacterial infection, they usually can be treated  with the use of antibiotics. The choice of antibiotic and length of treatment depend on your symptoms and the type of bacteria causing your infection. HOME CARE INSTRUCTIONS  If you were prescribed antibiotics, take them exactly as your caregiver instructs you. Finish the medication even if you feel better after you have only taken some of the medication.  Drink enough water and fluids to keep your urine clear or pale yellow.  Avoid caffeine, tea, and carbonated beverages. They tend to irritate your bladder.  Empty your bladder often. Avoid holding urine for long periods of time.  Empty your bladder before and after sexual intercourse.  After a bowel movement, women should cleanse from front to back. Use each tissue only once. SEEK MEDICAL CARE IF:   You have back pain.  You develop a fever.  Your symptoms do not begin to resolve within 3 days. SEEK IMMEDIATE MEDICAL CARE IF:   You have severe back pain or lower abdominal pain.  You develop chills.  You have nausea or vomiting.  You have continued burning or discomfort with urination. MAKE SURE YOU:   Understand these instructions.  Will watch your condition.  Will get help right away if you are not doing well or get worse. Document Released:  10/08/2004 Document Revised: 06/30/2011 Document Reviewed: 02/06/2011 [employer] Patient Information 2015 [place], [place]. This information is not intended to replace advice given to you by your health care provider. Make sure you discuss any questions you have with your health care provider.

## 2013-09-12 NOTE — ED Provider Notes (Signed)
Dr. Doy Mince completed her evaluation of the patient and determined the patient was in good condition and appropriate for discharge. She recommended orthostatic vital documentation before discharge. Filed Vitals:   09/12/13 1730 09/12/13 1805 09/12/13 1806 09/12/13 1807  BP: 152/82 151/75 161/76 139/74  Pulse: 63 68 70 89  Temp:      TempSrc:      Resp: 17 23 16 15   SpO2: 93% 94% 95% 94%     Verl Dicker, PA-C 09/13/13 1224

## 2013-09-12 NOTE — ED Notes (Signed)
Per [facility]- pt has had dizziness for several days. Reports nausea and near syncope today.

## 2013-09-12 NOTE — ED Notes (Addendum)
Neurology at bedside.

## 2013-09-12 NOTE — ED Notes (Signed)
No neuro deficts with EMS. Denies pain, dizziness, or nausea at this time. Pt states that she got up to walk and didn't remember getting to the other side of the room. Denies any fall or trauma.

## 2013-09-12 NOTE — Consult Note (Signed)
NEURO HOSPITALIST CONSULT NOTE    Reason for Consult: dizziness and period of decreased recollection  HPI:                                                                                                                                          [name] is an 80 y.o. female presents to the emergency department via EMS from home after an episode of dizziness. Patient states she was sitting in the living room holding her coffee cup when she went to get up to go to the kitchen, felt as though she stumbled to her TV stand (located 5-7 feet from where she got up) then put her coffee down and stabilized herself.  She does not have great memory of her stumbling.  Once getting to the TV stand she felt back to normal.  She states she has been having episodes of the room closing in and dizziness when standing for the past 7 months.  She has never fallen.  She admits to only drinking about  15 glasses of water in [place] and not eating right. In hospital BUN 23 cr 1.52 (this is her baseline) over last few month. All episodes on ly last for a few seconds and --other than this episode--have never had loss of memory. Currently she feels back to her baseline.  Wile in ED head CT is negative and UA shows UTI.   Carotid doppler 08/31/2013 shows bilateral 60-79% stenosis  Past Medical History  Diagnosis Date  . CAD (coronary artery disease)     a. 07/2008 Inf STEMI->RCA BMS;  b. 09/2008 CABGx2: LIMA->LAD, RIMA->OM as Y from LIMA;  b. 06/2011 MV EF 31%, bsaal to dist ant infarct, inf/inflat infarct with mild to mod peri-infarct ischemia [facility]).  . Ischemic cardiomyopathy     a. 06/2011 Echo: EF 40%, inf/inflat AK and lat HK, aortic sclerosis w/ mild AI, Mild to mod MR (prev mod-sev), Gr 1 DD, mild TR, RVSP 53mmHg  . Systolic CHF, chronic     a. 06/2011 EF 40%  . Hypertension   . Hyperlipidemia   . Hiatal hernia     large  . Tobacco abuse   . Cerebrovascular disease   .  COPD (chronic obstructive pulmonary disease)     presumed   . Arterial disease     peripheral arterial disease with claudication - bilateral SFA disease with ABI's 0.4-0.5 bilaterally   . Mitral regurgitation     a. prev Mod-Sev, read as mild to mod by echo 06/2011 [facility]).  . CKD (chronic kidney disease), stage IV     born with 1 kidney  . MI (myocardial infarction) 2010  . Thyroid nodule 10/2012    left dominant nodule cytology:non-neoplastic goiter.   Marland Kitchen  Cataracts, both eyes     not surgical  . Personal history of colonic polyps - adenomas 05/09/2013    Past Surgical History  Procedure Laterality Date  . Laprotomy for bowel obstruction    . Coronary artery bypass graft  09/2008    aortic thoraic anuerysm resecton 2010.   Marland Kitchen Coronary stent placement  07/2008    BMS to RCA  . Flexible sigmoidoscopy N/A 05/09/2013    Procedure: FLEXIBLE SIGMOIDOSCOPY;  Surgeon: [name], MD;  Location: [facility];  Service: Endoscopy;  Laterality: N/A;  with possible hemorrhoid banding use gastrosope unsdeated  . Appendectomy    . Oophorectomy      one removed in 70's  . Hemorrhoid surgery    . Colonoscopy N/A 05/12/2013    Procedure: COLONOSCOPY;  Surgeon: [name], MD;  Location: [facility];  Service: Endoscopy;  Laterality: N/A;    Family History  Problem Relation Age of Onset  . Coronary artery disease Mother     extensive family history; several other family members.  . Heart disease Mother     CHF  . Coronary artery disease Brother   . Leukemia Sister   . Leukemia Sister   . Leukemia Father   . Colon cancer Neg Hx   . Pancreatic cancer Neg Hx   . Stomach cancer Neg Hx   . Rectal cancer Neg Hx      Social History:  reports that she quit smoking about 5 years ago. Her smoking use included Cigarettes. She has a 60 pack-year smoking history. She has never used smokeless tobacco. She reports that she does not drink alcohol or use illicit drugs.  No Known  Allergies  MEDICATIONS:                                                                                                                     No current facility-administered medications for this encounter.   Current Outpatient Prescriptions  Medication Sig Dispense Refill  . amLODipine (NORVASC) 10 MG tablet Take 10 mg by mouth daily.      Marland Kitchen aspirin EC 81 MG tablet Take 81 mg by mouth daily.      . Coenzyme Q-10 100 MG capsule Take 100 mg by mouth daily.        . furosemide (LASIX) 20 MG tablet Take 20 mg by mouth daily.      . Melatonin 5 MG TABS Take 15 mg by mouth at bedtime as needed (sleep).      . metoprolol tartrate (LOPRESSOR) 25 MG tablet Take 25 mg by mouth 2 (two) times daily.      . pravastatin (PRAVACHOL) 80 MG tablet TAKE ONE TABLET BY MOUTH ONCE DAILY IN THE EVENING  30 tablet  11  . cephALEXin (KEFLEX) 500 MG capsule Take 1 capsule (500 mg total) by mouth 4 (four) times daily.  28 capsule  0  . nitroGLYCERIN (NITROSTAT) 0.4 MG SL tablet Place 1 tablet (0.4 mg total) under the tongue  every 5 (five) minutes as needed.  25 tablet  3      ROS:                                                                                                                                       History obtained from the patient  General ROS: negative for - chills, fatigue, fever, night sweats, weight gain or weight loss Psychological ROS: negative for - behavioral disorder, hallucinations, memory difficulties, mood swings or suicidal ideation Ophthalmic ROS: negative for - blurry vision, double vision, eye pain or loss of vision ENT ROS: negative for - epistaxis, nasal discharge, oral lesions, sore throat, tinnitus or vertigo Allergy and Immunology ROS: negative for - hives or itchy/watery eyes Hematological and Lymphatic ROS: negative for - bleeding problems, bruising or swollen lymph nodes Endocrine ROS: negative for - galactorrhea, hair pattern changes, polydipsia/polyuria or temperature  intolerance Respiratory ROS: negative for - cough, hemoptysis, shortness of breath or wheezing Cardiovascular ROS: negative for - chest pain, dyspnea on exertion, edema or irregular heartbeat Gastrointestinal ROS: negative for - abdominal pain, diarrhea, hematemesis, nausea/vomiting or stool incontinence Genito-Urinary ROS: negative for - dysuria, hematuria, incontinence or urinary frequency/urgency Musculoskeletal ROS: negative for - joint swelling or muscular weakness Neurological ROS: as noted in HPI Dermatological ROS: negative for rash and skin lesion changes   Blood pressure 151/59, pulse 61, temperature 98.2 F (36.8 C), temperature source Oral, resp. rate 22, SpO2 95.00%.   Neurologic Examination:                                                                                                      General: NAD Mental Status: Alert, oriented, thought content appropriate.  Speech fluent without evidence of aphasia.  Able to follow 3 step commands without difficulty. Cranial Nerves: II: Discs flat bilaterally; Visual fields grossly normal, pupils equal, round, reactive to light and accommodation III,IV, VI: ptosis not present, extra-ocular motions intact bilaterally V,VII: smile symmetric, facial light touch sensation normal bilaterally VIII: hearing normal bilaterally IX,X: gag reflex present XI: bilateral shoulder shrug XII: midline tongue extension without atrophy or fasciculations  Motor: Right : Upper extremity   5/5    Left:     Upper extremity   5/5  Lower extremity   5/5     Lower extremity   5/5 Tone and bulk:normal tone throughout; no atrophy noted Sensory: Pinprick and light touch intact throughout, bilaterally Deep Tendon Reflexes:  Right: Upper Extremity   Left:  Upper extremity   biceps (C-5 to C-6) 2/4   biceps (C-5 to C-6) 2/4 tricep (C7) 2/4    triceps (C7) 2/4 Brachioradialis (C6) 2/4  Brachioradialis (C6) 2/4  Lower Extremity Lower Extremity  quadriceps  (L-2 to L-4) 2/4   quadriceps (L-2 to L-4) 2/4 Achilles (S1) 2/4   Achilles (S1) 2/4  Plantars: Right: downgoing   Left: downgoing Cerebellar: normal finger-to-nose,  normal heel-to-shin test Gait: not tested CV: pulses palpable throughout    Lab Results: Basic Metabolic Panel:  Recent Labs Lab 09/12/13 1348 09/12/13 1457  NA 140 137  K 4.7 4.5  CL 101 105  CO2 26  --   GLUCOSE 118* 119*  BUN 23 24*  CREATININE 1.52* 1.60*  CALCIUM 10.1  --     Liver Function Tests:  Recent Labs Lab 09/12/13 1348  AST 25  ALT 8  ALKPHOS 62  BILITOT 0.5  PROT 7.4  ALBUMIN 3.9   No results found for this basename: LIPASE, AMYLASE,  in the last 168 hours No results found for this basename: AMMONIA,  in the last 168 hours  CBC:  Recent Labs Lab 09/12/13 1348 09/12/13 1457  WBC 8.3  --   NEUTROABS 6.0  --   HGB 16.2* 18.0*  HCT 48.1* 53.0*  MCV 88.4  --   PLT 433*  --     Cardiac Enzymes: No results found for this basename: CKTOTAL, CKMB, CKMBINDEX, TROPONINI,  in the last 168 hours  Lipid Panel: No results found for this basename: CHOL, TRIG, HDL, CHOLHDL, VLDL, LDLCALC,  in the last 168 hours  CBG: No results found for this basename: GLUCAP,  in the last 168 hours  Microbiology: Results for orders placed during the hospital encounter of 05/07/13  URINE CULTURE     Status: None   Collection Time    05/08/13  2:24 PM      Result Value Ref Range Status   Specimen Description URINE, RANDOM   Final   Special Requests ADDED [id number]   Final   Culture  Setup Time     Final   Value: 05/08/2013 21:35     Performed at [facility] Count     Final   Value: >=100,000 COLONIES/ML     Performed at [facility]   Culture     Final   Value: Multiple bacterial morphotypes present, none predominant. Suggest appropriate recollection if clinically indicated.     Performed at [facility]   Report Status 05/10/2013 FINAL   Final     Coagulation Studies:  Recent Labs  09/12/13 1348  LABPROT 13.2  INR 1.00    Imaging: Ct Head Wo Contrast  09/12/2013   CLINICAL DATA:  Near syncope  EXAM: CT HEAD WITHOUT CONTRAST  TECHNIQUE: Contiguous axial images were obtained from the base of the skull through the vertex without intravenous contrast.  COMPARISON:  None.  FINDINGS: There is mild age appropriate diffuse cerebral and cerebellar atrophy. The ventricles are normal in size and position. There is fairly extensive decreased density in the deep white matter of both cerebral hemispheres consistent with chronic small vessel ischemic change. The cerebellum and brainstem are normal.  The observed paranasal sinuses are clear with exception of right sphenoid sinus cells where there is mucoperiosteal thickening and a small amount of fluid. The mastoid air cells are clear. There is no acute skull fracture. There is calcification in the soft tissues in the posterior  parietal scalp which may reflect a sebaceous cyst.  IMPRESSION: 1. There is no acute ischemic or hemorrhagic event. There is no intracranial mass nor hydrocephalus. 2. There is extensive white matter hypodensity consistent with chronic small vessel ischemic change. 3. There is likely both acute and chronic sphenoid sinus inflammation.   Electronically Signed   By: David  Martinique   On: 09/12/2013 14:11    Etta Quill PA-C [facility] [phone]  09/12/2013, 4:11 PM  Patient seen and examined.  Clinical course and management discussed.  Necessary edits performed.  I agree with the above.  Assessment and plan of care developed and discussed below.    Assessment/Plan: 80 YO with periods of syncope and presyncope.  Patient with a recurrent episode today that was accompanied by an amnestic period.  Patient now at baseline with a non focal neurological examination.  From description episodes likely orthostatic in nature.  BP low for clinical setting of bilateral carotid  stenosis.   Head CT reviewed and shows no acute changes.   Recommendations: 1.  Orthostatic BP with heart rate. 2.  Patient to follow up with outpatient physicians so that consideration of BP control can be made in relation to other medical problems.  Hydration and po consumption stressed with patient as well.  3.  No further neurologic intervention is recommended at this time. Alexis Goodell, MD   Case discussed with Dr. Cyndee Brightly, MD [facility] [phone]  09/12/2013  6:05 PM

## 2013-09-13 LAB — URINE CULTURE: Colony Count: >=100000

## 2013-09-14 NOTE — ED Provider Notes (Signed)
Medical screening examination/treatment/procedure(s) were performed by non-physician practitioner and as supervising physician I was immediately available for consultation/collaboration.   EKG Interpretation None       Orlie Dakin, MD 09/14/13 0021

## 2013-09-15 NOTE — ED Provider Notes (Signed)
Medical screening examination/treatment/procedure(s) were conducted as a shared visit with non-physician practitioner(s) and myself.  I personally evaluated the patient during the encounter.   EKG Interpretation   Date/Time:  Tuesday September 12 2013 14:32:11 EDT Ventricular Rate:  61 PR Interval:  169 QRS Duration: 122 QT Interval:  458 QTC Calculation: 461 R Axis:   -50 Text Interpretation:  Sinus rhythm Left bundle branch block ED PHYSICIAN  INTERPRETATION AVAILABLE IN [facility] Confirmed by TEST, Record  ([id number]) on 09/14/2013 7:07:42 AM       I performed an examination on the patient including cardiac, pulmonary, and gi systems which were unremarkable.  Additionally I performed a neurologic exam which was a focal motor deficit or reproduction of symptoms at that time. Briefly the patient presented with a questionable near syncopal event. She described it as feeling not her membrane a short period of time, more fitting with amnesia than with syncope or near-syncope.  We consulted neurology who did not feel that the patient necessitated a more emergent imaging. This was discharged in stable condition  Debby Freiberg, MD 09/15/13 [phone]

## 2013-09-25 ENCOUNTER — Other Ambulatory Visit: Payer: Self-pay | Admitting: Cardiovascular Disease

## 2014-02-05 ENCOUNTER — Ambulatory Visit: Payer: Medicare Other | Admitting: Cardiovascular Disease

## 2014-04-13 ENCOUNTER — Ambulatory Visit (INDEPENDENT_AMBULATORY_CARE_PROVIDER_SITE_OTHER): Payer: Medicare Other | Admitting: Cardiovascular Disease

## 2014-04-13 ENCOUNTER — Encounter: Payer: Self-pay | Admitting: Cardiovascular Disease

## 2014-04-13 VITALS — BP 128/58 | HR 90 | Ht 66.5 in | Wt 141.0 lb

## 2014-04-13 DIAGNOSIS — I779 Disorder of arteries and arterioles, unspecified: Secondary | ICD-10-CM

## 2014-04-13 DIAGNOSIS — I739 Peripheral vascular disease, unspecified: Secondary | ICD-10-CM

## 2014-04-13 DIAGNOSIS — I255 Ischemic cardiomyopathy: Secondary | ICD-10-CM | POA: Diagnosis not present

## 2014-04-13 DIAGNOSIS — I10 Essential (primary) hypertension: Secondary | ICD-10-CM | POA: Diagnosis not present

## 2014-04-13 DIAGNOSIS — I080 Rheumatic disorders of both mitral and aortic valves: Secondary | ICD-10-CM | POA: Diagnosis not present

## 2014-04-13 DIAGNOSIS — N184 Chronic kidney disease, stage 4 (severe): Secondary | ICD-10-CM

## 2014-04-13 DIAGNOSIS — F17201 Nicotine dependence, unspecified, in remission: Secondary | ICD-10-CM

## 2014-04-13 DIAGNOSIS — E785 Hyperlipidemia, unspecified: Secondary | ICD-10-CM

## 2014-04-13 MED ORDER — NITROGLYCERIN 0.4 MG SL SUBL
0.4000 mg | SUBLINGUAL_TABLET | SUBLINGUAL | Status: AC | PRN
Start: 2014-04-13 — End: ?

## 2014-04-13 NOTE — Progress Notes (Signed)
CC: Chest pain  History of Present Illness: 81 yo female with history of CAD, ischemic cardiomyopathy, chronic systolic CHF, HTN, HLD, tobacco abuse, COPD, CKD, PAD, mitral regurgitation who is here today for cardiac follow up. She has been followed in the past by Dr. Lia Foyer. She had an inferior MI in July 2010 treated with a bare metal stent in the RCA. CABG September 2010 with LIMA to LAD and free RIMA Y graft to OM off of LIMA. She also had aortic root replacement at that time secondary to dilated aortic root. She is known to have severe PAD. Last carotid artery dopplers August 2015 with moderate 60-79% bilateral stenosis. She is followed in Nephrology by Dr. Marval Regal and her renal function is stable. Last echo June 2013 in [place] with LVEF 40%, moderate MR. Admitted September 2015 with near syncope. Felt to be orthostatic with UTI.   She has been doing well. Only one episode of mild chest pain that lasted one minute at rest. No recurrent chest pain or SOB. Weight is stable. She no longer smokes. Good energy level.   Primary Care Physician:  Claris Gower   Past Medical History  Diagnosis Date  . CAD (coronary artery disease)     a. 07/2008 Inf STEMI->RCA BMS;  b. 09/2008 CABGx2: LIMA->LAD, RIMA->OM as Y from LIMA;  b. 06/2011 MV EF 31%, bsaal to dist ant infarct, inf/inflat infarct with mild to mod peri-infarct ischemia [facility]).  . Ischemic cardiomyopathy     a. 06/2011 Echo: EF 40%, inf/inflat AK and lat HK, aortic sclerosis w/ mild AI, Mild to mod MR (prev mod-sev), Gr 1 DD, mild TR, RVSP 19mmHg  . Systolic CHF, chronic     a. 06/2011 EF 40%  . Hypertension   . Hyperlipidemia   . Hiatal hernia     large  . Tobacco abuse   . Cerebrovascular disease   . COPD (chronic obstructive pulmonary disease)     presumed   . Arterial disease     peripheral arterial disease with claudication - bilateral SFA disease with ABI's 0.4-0.5 bilaterally   . Mitral regurgitation     a.  prev Mod-Sev, read as mild to mod by echo 06/2011 [facility]).  . CKD (chronic kidney disease), stage IV     born with 1 kidney  . MI (myocardial infarction) 2010  . Thyroid nodule 10/2012    left dominant nodule cytology:non-neoplastic goiter.   . Cataracts, both eyes     not surgical  . Personal history of colonic polyps - adenomas 05/09/2013  . Anemia   . Systolic HF (heart failure)   . Hypertensive renal disease   . Hypertensive heart disease with congestive heart failure   . Aorta aneurysm   . LVH (left ventricular hypertrophy)   . Arteriosclerotic vascular disease   . Bronchiectasis   . Atherosclerosis of abdominal aorta     Past Surgical History  Procedure Laterality Date  . Laprotomy for bowel obstruction    . Coronary artery bypass graft  09/2008    aortic thoraic anuerysm resecton 2010.   Marland Kitchen Coronary stent placement  07/2008    BMS to RCA  . Flexible sigmoidoscopy N/A 05/09/2013    Procedure: FLEXIBLE SIGMOIDOSCOPY;  Surgeon: [name], MD;  Location: [facility];  Service: Endoscopy;  Laterality: N/A;  with possible hemorrhoid banding use gastrosope unsdeated  . Appendectomy    . Oophorectomy      one removed in 70's  .  Hemorrhoid surgery    . Colonoscopy N/A 05/12/2013    Procedure: COLONOSCOPY;  Surgeon: [name], MD;  [facility];  Service: Endoscopy;  Laterality: N/A;    Current Outpatient Prescriptions  Medication Sig Dispense Refill  . amLODipine (NORVASC) 10 MG tablet TAKE ONE TABLET BY MOUTH ONCE DAILY 30 tablet 6  . aspirin EC 81 MG tablet Take 81 mg by mouth daily.    . Coenzyme Q-10 100 MG capsule Take 100 mg by mouth daily.      . furosemide (LASIX) 20 MG tablet TAKE ONE TABLET BY MOUTH ONCE DAILY 30 tablet 6  . Melatonin 5 MG TABS Take 15 mg by mouth at bedtime as needed (sleep).    . metoprolol tartrate (LOPRESSOR) 25 MG tablet TAKE ONE TABLET BY MOUTH TWICE DAILY 60 tablet 3  . nitroGLYCERIN (NITROSTAT) 0.4 MG SL tablet  Place 1 tablet (0.4 mg total) under the tongue every 5 (five) minutes as needed. 25 tablet 6  . pravastatin (PRAVACHOL) 80 MG tablet TAKE ONE TABLET BY MOUTH ONCE DAILY IN THE EVENING 30 tablet 11   No current facility-administered medications for this visit.    No Known Allergies  History   Social History  . Marital Status: Widowed    Spouse Name: N/A  . Number of Children: Y  . Years of Education: N/A   Occupational History  . 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    Social History Main Topics  . Smoking status: Former Smoker -- 1.00 packs/day for 60 years    Types: Cigarettes    Quit date: 07/12/2008  . Smokeless tobacco: Never Used  . Alcohol Use: No  . Drug Use: No  . Sexual Activity: Not on file   Other Topics Concern  . Not on file   Social History Narrative   She lives in [place]. She is retired and widowed.     Family History  Problem Relation Age of Onset  . Coronary artery disease Mother     extensive family history; several other family members.  . Heart disease Mother     CHF  . Coronary artery disease Brother   . Leukemia Sister   . Leukemia Sister   . Leukemia Father   . Colon cancer Neg Hx   . Pancreatic cancer Neg Hx   . Stomach cancer Neg Hx   . Rectal cancer Neg Hx   . Kidney cancer Brother   . Heart failure Mother   . Heart attack Sister     Review of Systems:  As stated in the HPI and otherwise negative.   BP 128/58 mmHg  Pulse 90  Ht 5' 6.5" (1.689 m)  Wt 141 lb (63.957 kg)  BMI 22.42 kg/m2  SpO2 92%  Physical Examination: General: Well developed, well nourished, NAD HEENT: OP clear, mucus membranes moist SKIN: warm, dry. No rashes. Neuro: No focal deficits Musculoskeletal: Muscle strength 5/5 all ext Psychiatric: Mood and affect normal Neck: No JVD, no carotid bruits, no thyromegaly, no lymphadenopathy. Lungs:Clear bilaterally, no wheezes, rhonci, crackles Cardiovascular: Regular rate and rhythm. Systolic  murmur. No gallops or rubs. Abdomen:Soft. Bowel sounds present. Non-tender.  Extremities: No lower extremity edema. Pulses are 2 + in the bilateral DP/PT.  EKG:  EKG is not ordered today.  Recent Labs: 09/12/2013: ALT 8; BUN 24*; Creatinine 1.60*; Hemoglobin 18.0*; Platelets 433*; Potassium 4.5; Sodium 137   Lipid Panel    Component Value Date/Time   CHOL 205* 12/26/2012 4268  TRIG 158.0* 12/26/2012 0943   HDL 45.10 12/26/2012 0943   CHOLHDL 5 12/26/2012 0943   VLDL 31.6 12/26/2012 0943   LDLCALC 80 02/02/2012 1637   LDLDIRECT 136.2 12/26/2012 0943     Wt Readings from Last 3 Encounters:  04/13/14 141 lb (63.957 kg)  07/21/13 141 lb (63.957 kg)  05/10/13 142 lb (64.411 kg)     Other studies Reviewed: Additional studies/ records that were reviewed today include: none   Assessment and Plan:   1. CAROTID ARTERY STENOSIS: Stable 60-79% bilateral carotid artery disease by dopplers August 2015. Repeat now. She does not wish to f/u in [facility].     2. CKD:  Being followed by nephrology.   3. CAD: Stable. Continue current therapy.   4. Tobacco abuse, in remission: Not smoking.   5. HYPERCHOLESTEROLEMIA: She had been off of statins and was worried that Crestor was leading to her scalp issues. She is tolerating Pravastatin.      6. HYPERTENSION:  Well controlled.   No changes today  7. MITRAL REGURGITATION: Moderate by echo at [facility] June 2013 (scanned into EPIC). Repeat echo now.    8. Cardiomyopathy, ischemic: Overall stable.  Last LVEF 40% by echo June 2013 at [facility].       Current medicines are reviewed at length with the patient today.  The patient does not have concerns regarding medicines.  The following changes have been made:  no change  Labs/ tests ordered today include: echo, carotid dopplers.   Orders Placed This Encounter  Procedures  . Lipid Profile  . Hepatic function panel  . 2D Echocardiogram without contrast  . Carotid duplex    Disposition:   FU with me   in 6 months   Signed, Lauree Chandler, MD 04/13/2014 12:42 PM    [address], [place], [place]  [address] Phone: [phone]; Fax: [phone]

## 2014-04-13 NOTE — Patient Instructions (Addendum)
Your physician wants you to follow-up in: 6 months. You will receive a reminder letter in the mail two months in advance. If you don't receive a letter, please call our office to schedule the follow-up appointment.  Your physician has requested that you have an echocardiogram. Echocardiography is a painless test that uses sound waves to create images of your heart. It provides your doctor with information about the size and shape of your heart and how well your heart's chambers and valves are working. This procedure takes approximately one hour. There are no restrictions for this procedure.   Your physician has requested that you have a carotid duplex. This test is an ultrasound of the carotid arteries in your neck. It looks at blood flow through these arteries that supply the brain with blood. Allow one hour for this exam. There are no restrictions or special instructions.  Your physician recommends that you return for fasting lab work on day of tests

## 2014-04-19 ENCOUNTER — Other Ambulatory Visit: Payer: Self-pay

## 2014-04-19 ENCOUNTER — Inpatient Hospital Stay (HOSPITAL_COMMUNITY)
Admission: EM | Admit: 2014-04-19 | Discharge: 2014-04-22 | DRG: 291 | Disposition: A | Discharge status: Home / Self Care | Payer: Medicare Other | Attending: Cardiovascular Disease | Admitting: Cardiovascular Disease

## 2014-04-19 ENCOUNTER — Other Ambulatory Visit (HOSPITAL_COMMUNITY): Payer: Self-pay

## 2014-04-19 ENCOUNTER — Emergency Department (HOSPITAL_COMMUNITY): Payer: Medicare Other

## 2014-04-19 ENCOUNTER — Encounter (HOSPITAL_COMMUNITY): Payer: Self-pay | Admitting: *Deleted

## 2014-04-19 DIAGNOSIS — I255 Ischemic cardiomyopathy: Secondary | ICD-10-CM | POA: Diagnosis present

## 2014-04-19 DIAGNOSIS — J479 Bronchiectasis, uncomplicated: Secondary | ICD-10-CM | POA: Diagnosis present

## 2014-04-19 DIAGNOSIS — Z9842 Cataract extraction status, left eye: Secondary | ICD-10-CM | POA: Diagnosis not present

## 2014-04-19 DIAGNOSIS — E041 Nontoxic single thyroid nodule: Secondary | ICD-10-CM | POA: Diagnosis present

## 2014-04-19 DIAGNOSIS — I252 Old myocardial infarction: Secondary | ICD-10-CM | POA: Diagnosis not present

## 2014-04-19 DIAGNOSIS — I5022 Chronic systolic (congestive) heart failure: Secondary | ICD-10-CM | POA: Diagnosis not present

## 2014-04-19 DIAGNOSIS — I25709 Atherosclerosis of coronary artery bypass graft(s), unspecified, with unspecified angina pectoris: Secondary | ICD-10-CM

## 2014-04-19 DIAGNOSIS — I509 Heart failure, unspecified: Secondary | ICD-10-CM

## 2014-04-19 DIAGNOSIS — I5023 Acute on chronic systolic (congestive) heart failure: Secondary | ICD-10-CM

## 2014-04-19 DIAGNOSIS — I10 Essential (primary) hypertension: Secondary | ICD-10-CM | POA: Diagnosis present

## 2014-04-19 DIAGNOSIS — Z955 Presence of coronary angioplasty implant and graft: Secondary | ICD-10-CM | POA: Diagnosis not present

## 2014-04-19 DIAGNOSIS — I700 Atherosclerosis of aorta: Secondary | ICD-10-CM | POA: Diagnosis present

## 2014-04-19 DIAGNOSIS — R0602 Shortness of breath: Secondary | ICD-10-CM

## 2014-04-19 DIAGNOSIS — I340 Nonrheumatic mitral (valve) insufficiency: Secondary | ICD-10-CM | POA: Diagnosis present

## 2014-04-19 DIAGNOSIS — N184 Chronic kidney disease, stage 4 (severe): Secondary | ICD-10-CM | POA: Diagnosis present

## 2014-04-19 DIAGNOSIS — I080 Rheumatic disorders of both mitral and aortic valves: Secondary | ICD-10-CM | POA: Diagnosis present

## 2014-04-19 DIAGNOSIS — J449 Chronic obstructive pulmonary disease, unspecified: Secondary | ICD-10-CM | POA: Diagnosis present

## 2014-04-19 DIAGNOSIS — Z86010 Personal history of colonic polyps: Secondary | ICD-10-CM

## 2014-04-19 DIAGNOSIS — D649 Anemia, unspecified: Secondary | ICD-10-CM | POA: Diagnosis present

## 2014-04-19 DIAGNOSIS — I2510 Atherosclerotic heart disease of native coronary artery without angina pectoris: Secondary | ICD-10-CM | POA: Diagnosis present

## 2014-04-19 DIAGNOSIS — E785 Hyperlipidemia, unspecified: Secondary | ICD-10-CM | POA: Diagnosis present

## 2014-04-19 DIAGNOSIS — I719 Aortic aneurysm of unspecified site, without rupture: Secondary | ICD-10-CM | POA: Diagnosis present

## 2014-04-19 DIAGNOSIS — I129 Hypertensive chronic kidney disease with stage 1 through stage 4 chronic kidney disease, or unspecified chronic kidney disease: Secondary | ICD-10-CM | POA: Diagnosis present

## 2014-04-19 DIAGNOSIS — J9620 Acute and chronic respiratory failure, unspecified whether with hypoxia or hypercapnia: Secondary | ICD-10-CM | POA: Diagnosis present

## 2014-04-19 DIAGNOSIS — I5043 Acute on chronic combined systolic (congestive) and diastolic (congestive) heart failure: Secondary | ICD-10-CM | POA: Diagnosis not present

## 2014-04-19 DIAGNOSIS — Z9841 Cataract extraction status, right eye: Secondary | ICD-10-CM

## 2014-04-19 DIAGNOSIS — Z951 Presence of aortocoronary bypass graft: Secondary | ICD-10-CM

## 2014-04-19 DIAGNOSIS — Z87891 Personal history of nicotine dependence: Secondary | ICD-10-CM | POA: Diagnosis not present

## 2014-04-19 DIAGNOSIS — I77810 Thoracic aortic ectasia: Secondary | ICD-10-CM | POA: Diagnosis present

## 2014-04-19 DIAGNOSIS — Z8673 Personal history of transient ischemic attack (TIA), and cerebral infarction without residual deficits: Secondary | ICD-10-CM

## 2014-04-19 DIAGNOSIS — I739 Peripheral vascular disease, unspecified: Secondary | ICD-10-CM | POA: Diagnosis present

## 2014-04-19 DIAGNOSIS — I5042 Chronic combined systolic (congestive) and diastolic (congestive) heart failure: Secondary | ICD-10-CM | POA: Diagnosis present

## 2014-04-19 DIAGNOSIS — R0603 Acute respiratory distress: Secondary | ICD-10-CM

## 2014-04-19 DIAGNOSIS — R0600 Dyspnea, unspecified: Secondary | ICD-10-CM | POA: Diagnosis not present

## 2014-04-19 LAB — CREATININE, SERUM
Creatinine, Ser: 1.79 mg/dL — ABNORMAL HIGH (ref 0.50–1.10)
GFR calc Af Amer: 29 mL/min — ABNORMAL LOW (ref >90)
GFR calc non Af Amer: 25 mL/min — ABNORMAL LOW (ref >90)

## 2014-04-19 LAB — CBC WITH DIFFERENTIAL/PLATELET
BASOS ABS: 0.1 10*3/uL (ref 0.0–0.1)
BASOS PCT: 0 % (ref 0–1)
EOS PCT: 2 % (ref 0–5)
Eosinophils Absolute: 0.2 10*3/uL (ref 0.0–0.7)
HEMATOCRIT: 46.3 % — AB (ref 36.0–46.0)
Hemoglobin: 15.2 g/dL — ABNORMAL HIGH (ref 12.0–15.0)
Lymphocytes Relative: 32 % (ref 12–46)
Lymphs Abs: 4.5 10*3/uL — ABNORMAL HIGH (ref 0.7–4.0)
MCH: 29.9 pg (ref 26.0–34.0)
MCHC: 32.8 g/dL (ref 30.0–36.0)
MCV: 91 fL (ref 78.0–100.0)
MONO ABS: 1.1 10*3/uL — AB (ref 0.1–1.0)
MONOS PCT: 8 % (ref 3–12)
NEUTROS ABS: 7.9 10*3/uL — AB (ref 1.7–7.7)
Neutrophils Relative %: 58 % (ref 43–77)
Platelets: 564 10*3/uL — ABNORMAL HIGH (ref 150–400)
RBC: 5.09 MIL/uL (ref 3.87–5.11)
RDW: 14.7 % (ref 11.5–15.5)
WBC: 13.8 10*3/uL — ABNORMAL HIGH (ref 4.0–10.5)

## 2014-04-19 LAB — COMPREHENSIVE METABOLIC PANEL
ALK PHOS: 61 U/L (ref 39–117)
ALT: 11 U/L (ref 0–35)
AST: 27 U/L (ref 0–37)
Albumin: 3.7 g/dL (ref 3.5–5.2)
Anion gap: 14 (ref 5–15)
BILIRUBIN TOTAL: 1.1 mg/dL (ref 0.3–1.2)
BUN: 20 mg/dL (ref 6–23)
CALCIUM: 9.2 mg/dL (ref 8.4–10.5)
CO2: 22 mmol/L (ref 19–32)
Chloride: 103 mmol/L (ref 96–112)
Creatinine, Ser: 1.78 mg/dL — ABNORMAL HIGH (ref 0.50–1.10)
GFR calc Af Amer: 29 mL/min — ABNORMAL LOW (ref >90)
GFR, EST NON AFRICAN AMERICAN: 25 mL/min — AB (ref >90)
Glucose, Bld: 170 mg/dL — ABNORMAL HIGH (ref 70–99)
Potassium: 3.6 mmol/L (ref 3.5–5.1)
SODIUM: 139 mmol/L (ref 135–145)
Total Protein: 6.9 g/dL (ref 6.0–8.3)

## 2014-04-19 LAB — URINALYSIS, ROUTINE W REFLEX MICROSCOPIC
Bilirubin Urine: NEGATIVE
Glucose, UA: NEGATIVE mg/dL
HGB URINE DIPSTICK: NEGATIVE
Ketones, ur: NEGATIVE mg/dL
Nitrite: NEGATIVE
Protein, ur: NEGATIVE mg/dL
Specific Gravity, Urine: 1.008 (ref 1.005–1.030)
Urobilinogen, UA: 0.2 mg/dL (ref 0.0–1.0)
pH: 5.5 (ref 5.0–8.0)

## 2014-04-19 LAB — LIPASE, BLOOD: Lipase: 67 U/L — ABNORMAL HIGH (ref 11–59)

## 2014-04-19 LAB — URINE MICROSCOPIC-ADD ON

## 2014-04-19 LAB — I-STAT CHEM 8, ED
BUN: 24 mg/dL — AB (ref 6–23)
Calcium, Ion: 1.1 mmol/L — ABNORMAL LOW (ref 1.13–1.30)
Chloride: 104 mmol/L (ref 96–112)
Creatinine, Ser: 1.7 mg/dL — ABNORMAL HIGH (ref 0.50–1.10)
GLUCOSE: 175 mg/dL — AB (ref 70–99)
HEMATOCRIT: 49 % — AB (ref 36.0–46.0)
Hemoglobin: 16.7 g/dL — ABNORMAL HIGH (ref 12.0–15.0)
Potassium: 3.5 mmol/L (ref 3.5–5.1)
Sodium: 139 mmol/L (ref 135–145)
TCO2: 21 mmol/L (ref 0–100)

## 2014-04-19 LAB — I-STAT TROPONIN, ED: Troponin i, poc: 0.04 ng/mL (ref 0.00–0.08)

## 2014-04-19 LAB — PROTIME-INR
INR: 0.98 (ref 0.00–1.49)
Prothrombin Time: 13.1 seconds (ref 11.6–15.2)

## 2014-04-19 LAB — BRAIN NATRIURETIC PEPTIDE: B Natriuretic Peptide: 885.3 pg/mL — ABNORMAL HIGH (ref 0.0–100.0)

## 2014-04-19 LAB — I-STAT CG4 LACTIC ACID, ED: Lactic Acid, Venous: 1.59 mmol/L (ref 0.5–2.0)

## 2014-04-19 MED ORDER — LEVALBUTEROL HCL 0.63 MG/3ML IN NEBU
0.6300 mg | INHALATION_SOLUTION | Freq: Four times a day (QID) | RESPIRATORY_TRACT | Status: DC | PRN
  Filled 2014-04-19: qty 3

## 2014-04-19 MED ORDER — ASPIRIN EC 81 MG PO TBEC: 81.0000 mg | DELAYED_RELEASE_TABLET | Freq: Every day | ORAL | Status: DC

## 2014-04-19 MED ORDER — FUROSEMIDE 10 MG/ML IJ SOLN
40.0000 mg | Freq: Every day | INTRAMUSCULAR | Status: DC
  Administered 2014-04-19 – 2014-04-20 (×2): 40 mg via INTRAVENOUS
  Filled 2014-04-19 (×2): qty 4

## 2014-04-19 MED ORDER — ONDANSETRON HCL 4 MG/2ML IJ SOLN: 4.0000 mg | Freq: Four times a day (QID) | INTRAMUSCULAR | Status: DC | PRN

## 2014-04-19 MED ORDER — PRAVASTATIN SODIUM 80 MG PO TABS
80.0000 mg | ORAL_TABLET | Freq: Every day | ORAL | Status: DC
  Administered 2014-04-19 – 2014-04-22 (×4): 80 mg via ORAL
  Filled 2014-04-19 (×4): qty 1

## 2014-04-19 MED ORDER — SODIUM CHLORIDE 0.9 % IJ SOLN
3.0000 mL | Freq: Two times a day (BID) | INTRAMUSCULAR | Status: DC
  Administered 2014-04-19 – 2014-04-21 (×6): 3 mL via INTRAVENOUS
  Filled 2014-04-19: qty 3

## 2014-04-19 MED ORDER — HEPARIN SODIUM (PORCINE) 5000 UNIT/ML IJ SOLN
5000.0000 [IU] | Freq: Three times a day (TID) | INTRAMUSCULAR | Status: DC
  Administered 2014-04-19 – 2014-04-22 (×10): 5000 [IU] via SUBCUTANEOUS
  Filled 2014-04-19 (×10): qty 1

## 2014-04-19 MED ORDER — AMLODIPINE BESYLATE 10 MG PO TABS
10.0000 mg | ORAL_TABLET | Freq: Every day | ORAL | Status: DC
  Administered 2014-04-19 – 2014-04-20 (×2): 10 mg via ORAL
  Filled 2014-04-19: qty 1
  Filled 2014-04-19: qty 2

## 2014-04-19 MED ORDER — ASPIRIN EC 81 MG PO TBEC
81.0000 mg | DELAYED_RELEASE_TABLET | Freq: Every day | ORAL | Status: DC
  Administered 2014-04-19 – 2014-04-22 (×4): 81 mg via ORAL
  Filled 2014-04-19 (×4): qty 1

## 2014-04-19 MED ORDER — POTASSIUM CHLORIDE CRYS ER 20 MEQ PO TBCR
20.0000 meq | EXTENDED_RELEASE_TABLET | Freq: Every day | ORAL | Status: DC
  Administered 2014-04-19 – 2014-04-22 (×4): 20 meq via ORAL
  Filled 2014-04-19 (×4): qty 1

## 2014-04-19 MED ORDER — METOPROLOL TARTRATE 12.5 MG HALF TABLET
12.5000 mg | ORAL_TABLET | Freq: Every day | ORAL | Status: DC
  Administered 2014-04-19 – 2014-04-22 (×4): 12.5 mg via ORAL
  Filled 2014-04-19 (×4): qty 1

## 2014-04-19 MED ORDER — FUROSEMIDE 10 MG/ML IJ SOLN
20.0000 mg | Freq: Once | INTRAMUSCULAR | Status: AC
  Administered 2014-04-19: 20 mg via INTRAVENOUS
  Filled 2014-04-19: qty 2

## 2014-04-19 MED ORDER — SODIUM CHLORIDE 0.9 % IV SOLN: 250.0000 mL | INTRAVENOUS | Status: DC | PRN

## 2014-04-19 MED ORDER — NITROGLYCERIN 0.4 MG SL SUBL: 0.4000 mg | SUBLINGUAL_TABLET | SUBLINGUAL | Status: DC | PRN

## 2014-04-19 MED ORDER — SODIUM CHLORIDE 0.9 % IJ SOLN: 3.0000 mL | INTRAMUSCULAR | Status: DC | PRN

## 2014-04-19 MED ORDER — ACETAMINOPHEN 325 MG PO TABS: 650.0000 mg | ORAL_TABLET | ORAL | Status: DC | PRN

## 2014-04-19 MED ORDER — FUROSEMIDE 10 MG/ML IJ SOLN
40.0000 mg | Freq: Once | INTRAMUSCULAR | Status: AC
  Administered 2014-04-19: 40 mg via INTRAVENOUS
  Filled 2014-04-19: qty 4

## 2014-04-19 NOTE — ED Notes (Signed)
ORDERED BREAKFAST.

## 2014-04-19 NOTE — ED Notes (Signed)
Lunch Tray delivered 

## 2014-04-19 NOTE — ED Notes (Signed)
Lunch Tray ordered for patient.

## 2014-04-19 NOTE — ED Notes (Signed)
Patient arrived via EMS.  EMS reports initially sats were in the 80's  Patient denied CP, nausea, vomiting and diahrrea.  Atrovent and Albuterol treatment done with fluid in the bases.  Placed on CPAP and sats up to 95%.  C/o SOB off and on.  Patient received Albuterol 15mg , Atrovent 1mg  and Solumedrol 125mg  CBG 131 BP 137/85, P 119

## 2014-04-19 NOTE — ED Notes (Signed)
PA [facility] with cardiology at bedside.

## 2014-04-19 NOTE — ED Provider Notes (Signed)
CSN: [id number]     Arrival date & time 04/19/14  0417 History   First MD Initiated Contact with Patient 04/19/14 [phone]     Chief Complaint  Patient presents with  . Shortness of Breath     (Consider location/radiation/quality/duration/timing/severity/associated sxs/prior Treatment) HPI [name] is a 81 y.o. female with past medical history of coronary artery disease status post stent placement, ischemic cardiomyopathy, systolic CHF, hypertension, hyperlipidemia presenting today with shortness of breath. EMS was called to the scene and O2 sats were 80% on room air. She denies any chest pain. She was given albuterol and Atrovent and Solu-Medrol treatment. Patient was placed on Cipro and O2 sat rose to 95%. Patient is unable to provide her own history due to the acuity of her condition. She is able to tell me that she normally takes Lasix and has been compliant with this medication.   Past Medical History  Diagnosis Date  . CAD (coronary artery disease)     a. 07/2008 Inf STEMI->RCA BMS;  b. 09/2008 CABGx2: LIMA->LAD, RIMA->OM as Y from LIMA;  b. 06/2011 MV EF 31%, bsaal to dist ant infarct, inf/inflat infarct with mild to mod peri-infarct ischemia [facility]  . Ischemic cardiomyopathy     a. 06/2011 Echo: EF 40%, inf/inflat AK and lat HK, aortic sclerosis w/ mild AI, Mild to mod MR (prev mod-sev), Gr 1 DD, mild TR, RVSP 27mmHg  . Systolic CHF, chronic     a. 06/2011 EF 40%  . Hypertension   . Hyperlipidemia   . Hiatal hernia     large  . Tobacco abuse   . Cerebrovascular disease   . COPD (chronic obstructive pulmonary disease)     presumed   . Arterial disease     peripheral arterial disease with claudication - bilateral SFA disease with ABI's 0.4-0.5 bilaterally   . Mitral regurgitation     a. prev Mod-Sev, read as mild to mod by echo 06/2011 [facility]).  . CKD (chronic kidney disease), stage IV     born with 1 kidney  . MI (myocardial infarction) 2010  .  Thyroid nodule 10/2012    left dominant nodule cytology:non-neoplastic goiter.   . Cataracts, both eyes     not surgical  . Personal history of colonic polyps - adenomas 05/09/2013  . Anemia   . Systolic HF (heart failure)   . Hypertensive renal disease   . Hypertensive heart disease with congestive heart failure   . Aorta aneurysm   . LVH (left ventricular hypertrophy)   . Arteriosclerotic vascular disease   . Bronchiectasis   . Atherosclerosis of abdominal aorta    Past Surgical History  Procedure Laterality Date  . Laprotomy for bowel obstruction    . Coronary artery bypass graft  09/2008    aortic thoraic anuerysm resecton 2010.   Marland Kitchen Coronary stent placement  07/2008    BMS to RCA  . Flexible sigmoidoscopy N/A 05/09/2013    Procedure: FLEXIBLE SIGMOIDOSCOPY;  Surgeon: [name], MD;  Location: [place];  Service: Endoscopy;  Laterality: N/A;  with possible hemorrhoid banding use gastrosope unsdeated  . Appendectomy    . Oophorectomy      one removed in 70's  . Hemorrhoid surgery    . Colonoscopy N/A 05/12/2013    Procedure: COLONOSCOPY;  Surgeon: [name], MD;  Location: [facility];  Service: Endoscopy;  Laterality: N/A;   Family History  Problem Relation Age of Onset  . Coronary artery  disease Mother     extensive family history; several other family members.  . Heart disease Mother     CHF  . Coronary artery disease Brother   . Leukemia Sister   . Leukemia Sister   . Leukemia Father   . Colon cancer Neg Hx   . Pancreatic cancer Neg Hx   . Stomach cancer Neg Hx   . Rectal cancer Neg Hx   . Kidney cancer Brother   . Heart failure Mother   . Heart attack Sister    History  Substance Use Topics  . Smoking status: Former Smoker -- 1.00 packs/day for 60 years    Types: Cigarettes    Quit date: 07/12/2008  . Smokeless tobacco: Never Used  . Alcohol Use: No   OB History    No data available     Review of Systems  Unable to perform ROS: Acuity of  condition      Allergies  Review of patient's allergies indicates no known allergies.  Home Medications   Prior to Admission medications   Medication Sig Start Date End Date Taking? Authorizing Provider  amLODipine (NORVASC) 10 MG tablet TAKE ONE TABLET BY MOUTH ONCE DAILY 09/26/13   Burnell Blanks, MD  aspirin EC 81 MG tablet Take 81 mg by mouth daily.    Historical Provider, MD  Coenzyme Q-10 100 MG capsule Take 100 mg by mouth daily.      Historical Provider, MD  furosemide (LASIX) 20 MG tablet TAKE ONE TABLET BY MOUTH ONCE DAILY 09/26/13   Burnell Blanks, MD  Melatonin 5 MG TABS Take 15 mg by mouth at bedtime as needed (sleep).    Historical Provider, MD  metoprolol tartrate (LOPRESSOR) 25 MG tablet TAKE ONE TABLET BY MOUTH TWICE DAILY 09/26/13   Burnell Blanks, MD  nitroGLYCERIN (NITROSTAT) 0.4 MG SL tablet Place 1 tablet (0.4 mg total) under the tongue every 5 (five) minutes as needed. 04/13/14   Burnell Blanks, MD  pravastatin (PRAVACHOL) 80 MG tablet TAKE ONE TABLET BY MOUTH ONCE DAILY IN THE EVENING 07/21/13   Burnell Blanks, MD   BP 141/90 mmHg  Pulse 114  Temp(Src) 99.8 F (37.7 C) (Rectal)  Resp 21  Ht 5\' 5"  (1.651 m)  Wt 141 lb (63.957 kg)  BMI 23.46 kg/m2  SpO2 98% Physical Exam  Constitutional: She appears well-developed and well-nourished. She appears distressed.  HENT:  Head: Normocephalic and atraumatic.  Nose: Nose normal.  Mouth/Throat: Oropharynx is clear and moist. No oropharyngeal exudate.  Eyes: Conjunctivae and EOM are normal. Pupils are equal, round, and reactive to light. No scleral icterus.  Neck: Normal range of motion. Neck supple. No JVD present. No tracheal deviation present. No thyromegaly present.  Cardiovascular: Regular rhythm and normal heart sounds.  Exam reveals no gallop and no friction rub.   No murmur heard. tachycardic  Pulmonary/Chest: She is in respiratory distress. She has wheezes. She has rales.  She exhibits no tenderness.  Abdominal: Soft. Bowel sounds are normal. She exhibits no distension and no mass. There is no tenderness. There is no rebound and no guarding.  Musculoskeletal: Normal range of motion. She exhibits no edema or tenderness.  Lymphadenopathy:    She has no cervical adenopathy.  Neurological: She is alert. She exhibits normal muscle tone.  Skin: Skin is warm and dry. No rash noted. No erythema. No pallor.  Nursing note and vitals reviewed.   ED Course  Procedures (including critical care time) Labs  Review Labs Reviewed  CBC WITH DIFFERENTIAL/PLATELET - Abnormal; Notable for the following:    WBC 13.8 (*)    Hemoglobin 15.2 (*)    HCT 46.3 (*)    Platelets 564 (*)    Neutro Abs 7.9 (*)    Lymphs Abs 4.5 (*)    Monocytes Absolute 1.1 (*)    All other components within normal limits  COMPREHENSIVE METABOLIC PANEL - Abnormal; Notable for the following:    Glucose, Bld 170 (*)    Creatinine, Ser 1.78 (*)    GFR calc non Af Amer 25 (*)    GFR calc Af Amer 29 (*)    All other components within normal limits  BRAIN NATRIURETIC PEPTIDE - Abnormal; Notable for the following:    B Natriuretic Peptide 885.3 (*)    All other components within normal limits  LIPASE, BLOOD - Abnormal; Notable for the following:    Lipase 67 (*)    All other components within normal limits  I-STAT CHEM 8, ED - Abnormal; Notable for the following:    BUN 24 (*)    Creatinine, Ser 1.70 (*)    Glucose, Bld 175 (*)    Calcium, Ion 1.10 (*)    Hemoglobin 16.7 (*)    HCT 49.0 (*)    All other components within normal limits  CULTURE, BLOOD (ROUTINE X 2)  CULTURE, BLOOD (ROUTINE X 2)  PROTIME-INR  URINALYSIS, ROUTINE W REFLEX MICROSCOPIC  I-STAT CG4 LACTIC ACID, ED  Randolm Idol, ED    Imaging Review Dg Chest Port 1 View  04/19/2014   CLINICAL DATA:  Shortness of breath, fever, and cough.  EXAM: PORTABLE CHEST - 1 VIEW  COMPARISON:  05/07/2013  FINDINGS: Postoperative changes  in the mediastinum. Cardiac enlargement similar prior study. There is interval development of pulmonary vascular congestion with perihilar airspace disease and interstitial disease consistent with edema. Small bilateral pleural effusions are developing. No pneumothorax. Calcification of the aorta.  IMPRESSION: Cardiac enlargement with developing vascular congestion, edema, and small effusions.   Electronically Signed   By: Lucienne Capers M.D.   On: 04/19/2014 04:36     EKG Interpretation None    MUSE not working.  MDM   Final diagnoses:  SOB (shortness of breath)   Patient presents to the ED in severe resp distress.  She was immediately placed om bipap.  She was given IV lasix and subling nitro as well.  Bedside US quickly diagnoses pulm edema.  Cxr does not show any focal consolidation in addition.  Patient will require admission for continued management.  Cardiology was paged.  They rec adding 40mg  additional of lasix and to re-page 1 hour later, at 6am to speak with the AP for admission.  I spoke with the PA who will admit the patient for continued management.    Emergency Ultrasound: Limited Thoracic Performed and interpreted by Dr Claudine Mouton Longitudinal view of anterior left and right lung fields in real-time with linear probe. Indication: SOB Findings: + lung sliding multiple B lines Interpretation: no evidence of pneumothorax. Images electronically archived.     CRITICAL CARE Performed by: Everlene Balls   Total critical care time: 28min  Critical care time was exclusive of separately billable procedures and treating other patients.  Critical care was necessary to treat or prevent imminent or life-threatening deterioration.  Critical care was time spent personally by me on the following activities: development of treatment plan with patient and/or surrogate as well as nursing, discussions with consultants, evaluation of  patient's response to treatment, examination of patient,  obtaining history from patient or surrogate, ordering and performing treatments and interventions, ordering and review of laboratory studies, ordering and review of radiographic studies, pulse oximetry and re-evaluation of patient's condition.     Everlene Balls, MD 04/19/14 [phone]

## 2014-04-19 NOTE — ED Notes (Signed)
Family at bedside. 

## 2014-04-19 NOTE — H&P (Signed)
Patient ID: ABRIELLE FINCK MRN: [id number], DOB/AGE: May 11, 1927   Admit date: 04/19/2014   Primary Physician: Leonard Downing, MD Primary Cardiologist: Dr Julianne Handler  HPI:  Pleasant 81 y/o female, [place], still drives during the day, developed increasing dyspnea over the past week. She is followed by Dr Julianne Handler, who just saw her 04/13/14 and she was doing well. No medication changes made. She has history of CAD, ischemic cardiomyopathy, chronic systolic CHF, HTN, HLD, tobacco abuse, COPD, CKD, PAD, mitral regurgitation who is here. She had an inferior MI in July 2010 treated with a bare metal stent in the RCA, followed by CABG September 2010 with LIMA to LAD and free RIMA Y graft to OM off of LIMA. She also had aortic root replacement at that time secondary to dilated aortic root. She is known to have severe PAD. Last carotid artery dopplers August 2015 with moderate 60-79% bilateral stenosis. She is followed in Nephrology by Dr. Marval Regal and her renal function is stable. Last echo June 2013 in [place] with LVEF 40%, moderate MR. Her EF by Myoview June 2013 was 31%. Her Myoview was abnormal but she was doing well and it was decided not to cath her then. This am her dyspnea became severe and EMS was called. En route to [facility] ED she received solu medrol, and a breathing treatment. She was placed on BiPap. In the ED she received 60 mg IV lasix. She is much improved by the time I saw her- sitting up in bed, on O2 breathing comfortably. She denies missing any medication doses or unusual sodium intake. She says she has been using her prn inhaler more this week. No fever or chills, or chest pain.   Problem List: Past Medical History  Diagnosis Date  . CAD (coronary artery disease)     a. 07/2008 Inf STEMI->RCA BMS;  b. 09/2008 CABGx2: LIMA->LAD, RIMA->OM as Y from LIMA;  b. 06/2011 MV EF 31%, bsaal to dist ant infarct, inf/inflat infarct with mild to mod peri-infarct ischemia  [facility]).  . Ischemic cardiomyopathy     a. 06/2011 Echo: EF 40%, inf/inflat AK and lat HK, aortic sclerosis w/ mild AI, Mild to mod MR (prev mod-sev), Gr 1 DD, mild TR, RVSP 69mmHg  . Systolic CHF, chronic     a. 06/2011 EF 40%  . Hypertension   . Hyperlipidemia   . Hiatal hernia     large  . Tobacco abuse   . Cerebrovascular disease   . COPD (chronic obstructive pulmonary disease)     presumed   . Arterial disease     peripheral arterial disease with claudication - bilateral SFA disease with ABI's 0.4-0.5 bilaterally   . Mitral regurgitation     a. prev Mod-Sev, read as mild to mod by echo 06/2011 [facility]).  . CKD (chronic kidney disease), stage IV     born with 1 kidney  . MI (myocardial infarction) 2010  . Thyroid nodule 10/2012    left dominant nodule cytology:non-neoplastic goiter.   . Cataracts, both eyes     not surgical  . Personal history of colonic polyps - adenomas 05/09/2013  . Anemia   . Systolic HF (heart failure)   . Hypertensive renal disease   . Hypertensive heart disease with congestive heart failure   . Aorta aneurysm   . LVH (left ventricular hypertrophy)   . Arteriosclerotic vascular disease   . Bronchiectasis   . Atherosclerosis of abdominal  aorta     Past Surgical History  Procedure Laterality Date  . Laprotomy for bowel obstruction    . Coronary artery bypass graft  09/2008    aortic thoraic anuerysm resecton 2010.   Marland Kitchen Coronary stent placement  07/2008    BMS to RCA  . Flexible sigmoidoscopy N/A 05/09/2013    Procedure: FLEXIBLE SIGMOIDOSCOPY;  Surgeon: [name], MD;  Location: [place];  Service: Endoscopy;  Laterality: N/A;  with possible hemorrhoid banding use gastrosope unsdeated  . Appendectomy    . Oophorectomy      one removed in 70's  . Hemorrhoid surgery    . Colonoscopy N/A 05/12/2013    Procedure: COLONOSCOPY;  Surgeon: [name], MD;  [facility];  Service: Endoscopy;  Laterality: N/A;      Allergies: No Known Allergies   Home Medications Current Facility-Administered Medications  Medication Dose Route Frequency Provider Last Rate Last Dose  . nitroGLYCERIN (NITROSTAT) SL tablet 0.4 mg  0.4 mg Sublingual Q5 min PRN Everlene Balls, MD       Current Outpatient Prescriptions  Medication Sig Dispense Refill  . albuterol (PROVENTIL HFA;VENTOLIN HFA) 108 (90 BASE) MCG/ACT inhaler Inhale 2 puffs into the lungs every 6 (six) hours as needed for wheezing or shortness of breath.    Marland Kitchen amLODipine (NORVASC) 10 MG tablet TAKE ONE TABLET BY MOUTH ONCE DAILY 30 tablet 6  . aspirin EC 81 MG tablet Take 81 mg by mouth daily.    . Coenzyme Q-10 100 MG capsule Take 100 mg by mouth daily.      . furosemide (LASIX) 20 MG tablet TAKE ONE TABLET BY MOUTH ONCE DAILY 30 tablet 6  . Melatonin 5 MG TABS Take 15 mg by mouth at bedtime as needed (sleep).    . metoprolol tartrate (LOPRESSOR) 25 MG tablet TAKE ONE TABLET BY MOUTH TWICE DAILY (Patient taking differently: TAKE ONE HALF TABLET BY MOUTH DAILY) 60 tablet 3  . pravastatin (PRAVACHOL) 80 MG tablet TAKE ONE TABLET BY MOUTH ONCE DAILY IN THE EVENING 30 tablet 11  . nitroGLYCERIN (NITROSTAT) 0.4 MG SL tablet Place 1 tablet (0.4 mg total) under the tongue every 5 (five) minutes as needed. (Patient taking differently: Place 0.4 mg under the tongue every 5 (five) minutes as needed for chest pain. ) 25 tablet 6     Family History  Problem Relation Age of Onset  . Coronary artery disease Mother     extensive family history; several other family members.  . Heart disease Mother     CHF  . Coronary artery disease Brother   . Leukemia Sister   . Leukemia Sister   . Leukemia Father   . Colon cancer Neg Hx   . Pancreatic cancer Neg Hx   . Stomach cancer Neg Hx   . Rectal cancer Neg Hx   . Kidney cancer Brother   . Heart failure Mother   . Heart attack Sister      History   Social History  . Marital Status: Widowed    Spouse Name: N/A  .  Number of Children: Y  . Years of Education: N/A   Occupational History  . 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    Social History Main Topics  . Smoking status: Former Smoker -- 1.00 packs/day for 60 years    Types: Cigarettes    Quit date: 07/12/2008  . Smokeless tobacco: Never Used  . Alcohol Use: No  . Drug Use: No  .  Sexual Activity: Not on file   Other Topics Concern  . Not on file   Social History Narrative   She lives in [place]. She is retired and widowed.      Review of Systems: General: negative for chills, fever, night sweats or weight changes.  Cardiovascular: negative for chest pain, dyspnea on exertion, edema, orthopnea, palpitations, paroxysmal nocturnal dyspnea or shortness of breath Dermatological: negative for rash Respiratory: negative for cough or wheezing Urologic: negative for hematuria Abdominal: negative for nausea, vomiting, diarrhea, bright red blood per rectum, melena, or hematemesis Neurologic: negative for visual changes, syncope, or dizziness All other systems reviewed and are otherwise negative except as noted above.  Physical Exam: Blood pressure 145/70, pulse 99, temperature 99.8 F (37.7 C), temperature source Rectal, resp. rate 17, height 5\' 5"  (1.651 m), weight 141 lb (63.957 kg), SpO2 96 %.  General appearance: alert, cooperative, no distress and frail Neck: no JVD Lungs: decreased breath sounds, few rales Lt base Heart: regular rate and rhythm and 2/6 MR murmur Abdomen: soft, Lt sided hernia noted Extremities: no edema Pulses: diminnished Skin: pale cool dry Neurologic: Grossly normal    Labs:   Results for orders placed or performed during the hospital encounter of 04/19/14 (from the past 24 hour(s))  CBC with Differential/Platelet     Status: Abnormal   Collection Time: 04/19/14  4:20 AM  Result Value Ref Range   WBC 13.8 (H) 4.0 - 10.5 K/uL   RBC 5.09 3.87 - 5.11 MIL/uL   Hemoglobin 15.2 (H) 12.0 - 15.0 g/dL    HCT 46.3 (H) 36.0 - 46.0 %   MCV 91.0 78.0 - 100.0 fL   MCH 29.9 26.0 - 34.0 pg   MCHC 32.8 30.0 - 36.0 g/dL   RDW 14.7 11.5 - 15.5 %   Platelets 564 (H) 150 - 400 K/uL   Neutrophils Relative % 58 43 - 77 %   Neutro Abs 7.9 (H) 1.7 - 7.7 K/uL   Lymphocytes Relative 32 12 - 46 %   Lymphs Abs 4.5 (H) 0.7 - 4.0 K/uL   Monocytes Relative 8 3 - 12 %   Monocytes Absolute 1.1 (H) 0.1 - 1.0 K/uL   Eosinophils Relative 2 0 - 5 %   Eosinophils Absolute 0.2 0.0 - 0.7 K/uL   Basophils Relative 0 0 - 1 %   Basophils Absolute 0.1 0.0 - 0.1 K/uL  Comprehensive metabolic panel     Status: Abnormal   Collection Time: 04/19/14  4:20 AM  Result Value Ref Range   Sodium 139 135 - 145 mmol/L   Potassium 3.6 3.5 - 5.1 mmol/L   Chloride 103 96 - 112 mmol/L   CO2 22 19 - 32 mmol/L   Glucose, Bld 170 (H) 70 - 99 mg/dL   BUN 20 6 - 23 mg/dL   Creatinine, Ser 1.78 (H) 0.50 - 1.10 mg/dL   Calcium 9.2 8.4 - 10.5 mg/dL   Total Protein 6.9 6.0 - 8.3 g/dL   Albumin 3.7 3.5 - 5.2 g/dL   AST 27 0 - 37 U/L   ALT 11 0 - 35 U/L   Alkaline Phosphatase 61 39 - 117 U/L   Total Bilirubin 1.1 0.3 - 1.2 mg/dL   GFR calc non Af Amer 25 (L) >90 mL/min   GFR calc Af Amer 29 (L) >90 mL/min   Anion gap 14 5 - 15  Brain natriuretic peptide     Status: Abnormal   Collection Time: 04/19/14  4:20 AM  Result Value Ref Range   B Natriuretic Peptide 885.3 (H) 0.0 - 100.0 pg/mL  Lipase, blood     Status: Abnormal   Collection Time: 04/19/14  4:20 AM  Result Value Ref Range   Lipase 67 (H) 11 - 59 U/L  Protime-INR     Status: None   Collection Time: 04/19/14  4:20 AM  Result Value Ref Range   Prothrombin Time 13.1 11.6 - 15.2 seconds   INR 0.98 0.00 - 1.49  I-stat troponin, ED     Status: None   Collection Time: 04/19/14  4:34 AM  Result Value Ref Range   Troponin i, poc 0.04 0.00 - 0.08 ng/mL   Comment 3          I-stat chem 8, ed     Status: Abnormal   Collection Time: 04/19/14  4:37 AM  Result Value Ref Range     Sodium 139 135 - 145 mmol/L   Potassium 3.5 3.5 - 5.1 mmol/L   Chloride 104 96 - 112 mmol/L   BUN 24 (H) 6 - 23 mg/dL   Creatinine, Ser 1.70 (H) 0.50 - 1.10 mg/dL   Glucose, Bld 175 (H) 70 - 99 mg/dL   Calcium, Ion 1.10 (L) 1.13 - 1.30 mmol/L   TCO2 21 0 - 100 mmol/L   Hemoglobin 16.7 (H) 12.0 - 15.0 g/dL   HCT 49.0 (H) 36.0 - 46.0 %  I-Stat CG4 Lactic Acid, ED     Status: None   Collection Time: 04/19/14  4:37 AM  Result Value Ref Range   Lactic Acid, Venous 1.59 0.5 - 2.0 mmol/L     Radiology/Studies: Dg Chest Port 1 View  04/19/2014   CLINICAL DATA:  Shortness of breath, fever, and cough.  EXAM: PORTABLE CHEST - 1 VIEW  COMPARISON:  05/07/2013  FINDINGS: Postoperative changes in the mediastinum. Cardiac enlargement similar prior study. There is interval development of pulmonary vascular congestion with perihilar airspace disease and interstitial disease consistent with edema. Small bilateral pleural effusions are developing. No pneumothorax. Calcification of the aorta.  IMPRESSION: Cardiac enlargement with developing vascular congestion, edema, and small effusions.   Electronically Signed   By: Lucienne Capers M.D.   On: 04/19/2014 04:36    EKG:NSR, ST LBBB, PVCs  ASSESSMENT AND PLAN:  Principal Problem:   Acute on chronic respiratory failure Active Problems:   CAD- RCA BMS July 2010, CABG x 2 and AO root Sept 0630   Chronic systolic heart failure   COPD (chronic obstructive pulmonary disease)   CKD (chronic kidney disease), stage IV   ICM-EF 40% by echo June 2013, 31% by Myoview   Dyslipidemia   Essential hypertension   PVD - bilat ICA 60-79%   Moderate MR   PLAN: Suspect she has mixed COPD exacerbation and acute systolic CHF. She probably needs a higher dose of home Lasix with her CRI. Will admit, check echo, Lasix 40 mg IV daily.    Henri Medal, PA-C 04/19/2014, 7:28 AM   Patient seen and examined and history reviewed. Agree with above findings and plan.  Very pleasant 81 yo WF admitted with acute SOB. She has been experiencing increased dyspnea and orthopnea for one week. No cough or fever. No allergies. Has a history of COPD but no prior asthma. Known CHF with EF 31% in past. No increased edema. Does not weigh at home. Avoids salt. Quickly responded to nebulizer and diuretics in ED. Now quite comfortable. Laboratory significant for CKD  stage 3-4. BNP elevated 885. CXR and CT most consistent with pulmonary edema. Ecg shows sinus tachy with old LBBB. Exam reveals rales in left base. Grade 2/6 systolic murmur RUSB. No JVD or edema. I think her history, exam, and laboratory findings are most consistent with CHF with acute pulmonary edema. (acute on chronic systolic CHF). Rapid improvement with diuretics. While COPD may be contributing I think bronchospasm is less likely. Will admit to telemetry. IV diuretics. Monitor I/O and daily weight. Low sodium diet. Will update Echo. Not a candidate for ARB/ACEi/Aldactone due to CKD. Continue metoprolol ? Increase dose. With her known PAD and CKD she could have RAS leading to flash pulmonary edema. May want to consider renal dopplers.   Peter Martinique, [place] 04/19/2014 8:02 AM

## 2014-04-19 NOTE — ED Notes (Signed)
Attempted report 

## 2014-04-20 DIAGNOSIS — I739 Peripheral vascular disease, unspecified: Secondary | ICD-10-CM

## 2014-04-20 DIAGNOSIS — J438 Other emphysema: Secondary | ICD-10-CM

## 2014-04-20 DIAGNOSIS — I5022 Chronic systolic (congestive) heart failure: Secondary | ICD-10-CM

## 2014-04-20 DIAGNOSIS — I509 Heart failure, unspecified: Secondary | ICD-10-CM

## 2014-04-20 LAB — CBC
HCT: 42 % (ref 36.0–46.0)
Hemoglobin: 14 g/dL (ref 12.0–15.0)
MCH: 29.7 pg (ref 26.0–34.0)
MCHC: 33.3 g/dL (ref 30.0–36.0)
MCV: 89.2 fL (ref 78.0–100.0)
Platelets: 499 10*3/uL — ABNORMAL HIGH (ref 150–400)
RBC: 4.71 MIL/uL (ref 3.87–5.11)
RDW: 14.6 % (ref 11.5–15.5)
WBC: 11.5 10*3/uL — ABNORMAL HIGH (ref 4.0–10.5)

## 2014-04-20 LAB — BASIC METABOLIC PANEL
Anion gap: 7 (ref 5–15)
BUN: 26 mg/dL — ABNORMAL HIGH (ref 6–23)
CO2: 28 mmol/L (ref 19–32)
Calcium: 9.2 mg/dL (ref 8.4–10.5)
Chloride: 101 mmol/L (ref 96–112)
Creatinine, Ser: 1.7 mg/dL — ABNORMAL HIGH (ref 0.50–1.10)
GFR calc Af Amer: 30 mL/min — ABNORMAL LOW (ref >90)
GFR calc non Af Amer: 26 mL/min — ABNORMAL LOW (ref >90)
Glucose, Bld: 118 mg/dL — ABNORMAL HIGH (ref 70–99)
Potassium: 4.7 mmol/L (ref 3.5–5.1)
Sodium: 136 mmol/L (ref 135–145)

## 2014-04-20 MED ORDER — ISOSORB DINITRATE-HYDRALAZINE 20-37.5 MG PO TABS
1.0000 | ORAL_TABLET | Freq: Two times a day (BID) | ORAL | Status: DC
  Administered 2014-04-20 – 2014-04-22 (×4): 1 via ORAL
  Filled 2014-04-20 (×5): qty 1

## 2014-04-20 MED ORDER — TRAZODONE HCL 50 MG PO TABS
50.0000 mg | ORAL_TABLET | Freq: Every evening | ORAL | Status: DC | PRN
  Administered 2014-04-20: 50 mg via ORAL
  Filled 2014-04-20 (×2): qty 1

## 2014-04-20 MED ORDER — FUROSEMIDE 10 MG/ML IJ SOLN
80.0000 mg | INTRAMUSCULAR | Status: AC
  Administered 2014-04-20: 80 mg via INTRAVENOUS

## 2014-04-20 MED ORDER — FUROSEMIDE 10 MG/ML IJ SOLN
40.0000 mg | Freq: Two times a day (BID) | INTRAMUSCULAR | Status: DC
  Administered 2014-04-21 (×2): 40 mg via INTRAVENOUS
  Filled 2014-04-20 (×4): qty 4

## 2014-04-20 NOTE — Care Management Note (Unsigned)
    Page 1 of 1   04/20/2014     2:31:59 PM CARE MANAGEMENT NOTE 04/20/2014  Patient:  [name], [name]   Account Number:  [id number]  Date Initiated:  04/20/2014  Documentation initiated by:  Divinity Kyler  Subjective/Objective Assessment:   Pt adm on 04/19/14 with resp failure/CHF.  PTA, pt resides at home alone.     Action/Plan:   Will follow for dc needs as pt progresses.   Anticipated DC Date:  04/23/2014   Anticipated DC Plan:  [place]  CM consult      Choice offered to / List presented to:             Status of service:  In process, will continue to follow Medicare Important Message given?   (If response is "NO", the following Medicare IM given date fields will be blank) Date Medicare IM given:   Medicare IM given by:   Date Additional Medicare IM given:   Additional Medicare IM given by:    Discharge Disposition:    Per UR Regulation:  Reviewed for med. necessity/level of care/duration of stay  If discussed at [place] of Stay Meetings, dates discussed:    Comments:

## 2014-04-20 NOTE — Progress Notes (Signed)
  Echocardiogram 2D Echocardiogram has been performed.  [name] [name] 04/20/2014, 9:18 AM

## 2014-04-20 NOTE — Progress Notes (Signed)
Patient Profile: 81 y/o female with a history of CAD s/p Inferior MI resulting in CABG + aortic root replacement in 2010 (LIMA to LAD and free RIMA Y graft to OM off of LIMA), ischemic cardiomyopathy w/ EF of 40% in June 2013 (31% on NST), chronic systolic CHF, chronic LBBB, HTN, HLD, tobacco abuse, COPD, CKD, PAD and mitral regurgitation. Followed by Dr. Angelena Form. Admitted 04/19/14 for dyspnea secondary to acute on chronic systolic CHF. BNP on admit 885.3. CXR  most consistent with pulmonary edema.  Subjective: Breathing better. Denies CP. Requesting to get her carotid dopplers done in house (was ordered by Dr. Angelena Form for outpatient study)  Objective: Vital signs in last 24 hours: Temp:  [98 F (36.7 C)-99.4 F (37.4 C)] 99.4 F (37.4 C) (04/08 0952) Pulse Rate:  [80-107] 88 (04/08 0952) Resp:  [17-29] 20 (04/08 0952) BP: (99-135)/(58-87) 117/60 mmHg (04/08 0952) SpO2:  [91 %-98 %] 93 % (04/08 0952) Weight:  [139 lb 4.8 oz (63.186 kg)-141 lb 11.2 oz (64.275 kg)] 139 lb 4.8 oz (63.186 kg) (04/08 0737) Last BM Date: 04/18/14  Intake/Output from previous day: 04/07 0701 - 04/08 0700 In: 603 [P.O.:600; I.V.:3] Out: 2700 [Urine:2700] Intake/Output this shift: Total I/O In: 240 [P.O.:240] Out: -   Medications Current Facility-Administered Medications  Medication Dose Route Frequency Provider Last Rate Last Dose  . 0.9 %  sodium chloride infusion  250 mL Intravenous PRN Erlene Quan, PA-C      . acetaminophen (TYLENOL) tablet 650 mg  650 mg Oral Q4H PRN Doreene Burke Kilroy, PA-C      . amLODipine (NORVASC) tablet 10 mg  10 mg Oral Daily Erlene Quan, PA-C   10 mg at 04/20/14 0956  . aspirin EC tablet 81 mg  81 mg Oral Daily Erlene Quan, PA-C   81 mg at 04/20/14 7829  . furosemide (LASIX) injection 40 mg  40 mg Intravenous Daily Erlene Quan, PA-C   40 mg at 04/20/14 5621  . heparin injection 5,000 Units  5,000 Units Subcutaneous 3 times per day Erlene Quan, PA-C   5,000 Units at  04/20/14 3086  . levalbuterol (XOPENEX) nebulizer solution 0.63 mg  0.63 mg Nebulization Q6H PRN Erlene Quan, PA-C      . metoprolol tartrate (LOPRESSOR) tablet 12.5 mg  12.5 mg Oral Daily Doreene Burke Kilroy, PA-C   12.5 mg at 04/20/14 0956  . nitroGLYCERIN (NITROSTAT) SL tablet 0.4 mg  0.4 mg Sublingual Q5 min PRN Everlene Balls, MD      . ondansetron (ZOFRAN) injection 4 mg  4 mg Intravenous Q6H PRN Erlene Quan, PA-C      . potassium chloride SA (K-DUR,KLOR-CON) CR tablet 20 mEq  20 mEq Oral Daily Erlene Quan, PA-C   20 mEq at 04/20/14 0956  . pravastatin (PRAVACHOL) tablet 80 mg  80 mg Oral Daily Erlene Quan, PA-C   80 mg at 04/19/14 1122  . sodium chloride 0.9 % injection 3 mL  3 mL Intravenous Q12H Erlene Quan, PA-C   3 mL at 04/20/14 0956  . sodium chloride 0.9 % injection 3 mL  3 mL Intravenous PRN Erlene Quan, PA-C      . traZODone (DESYREL) tablet 50 mg  50 mg Oral QHS PRN Lamar Sprinkles, MD   50 mg at 04/20/14 0239    PE: General appearance: alert, cooperative and no distress Neck: no carotid bruit and no JVD Lungs: decreased BS at the bases with  faint rales Heart: regular rate and rhythm and 2/6 MR murmur Extremities: no LEE Pulses: 2+ and symmetric Skin: warm and dry Neurologic: Grossly normal  Filed Weights   04/19/14 0422 04/19/14 1418 04/20/14 0737  Weight: 141 lb (63.957 kg) 141 lb 11.2 oz (64.275 kg) 139 lb 4.8 oz (63.186 kg)   Lab Results:   Recent Labs  04/19/14 0420 04/19/14 0437 04/20/14 0257  WBC 13.8*  --  11.5*  HGB 15.2* 16.7* 14.0  HCT 46.3* 49.0* 42.0  PLT 564*  --  499*   BMET  Recent Labs  04/19/14 0420 04/19/14 0437 04/20/14 0257  NA 139 139 136  K 3.6 3.5 4.7  CL 103 104 101  CO2 22  --  28  GLUCOSE 170* 175* 118*  BUN 20 24* 26*  CREATININE 1.79*  1.78* 1.70* 1.70*  CALCIUM 9.2  --  9.2   PT/INR  Recent Labs  04/19/14 0420  LABPROT 13.1  INR 0.98   Cardiac Panel (last 3 results) No results for input(s): CKTOTAL,  CKMB, TROPONINI, RELINDX in the last 72 hours.  Studies/Results: 2D echo - pending   Assessment/Plan  Principal Problem:   Acute on chronic systolic CHF (congestive heart failure) Active Problems:   Dyslipidemia   Moderate MR   Essential hypertension   CAD- RCA BMS July 2010, CABG x 2 and AO root Sept 4650   Chronic systolic heart failure   PVD - bilat ICA 60-79%   COPD (chronic obstructive pulmonary disease)   CKD (chronic kidney disease), stage IV   ICM-EF 40% by echo June 2013, 31% by Myoview   Acute on chronic respiratory failure   1. Acute on Chronic Systolic CHF: Last known EF was 40% by echo and 31% by NST in 2013. Repeat echo pending. BNP elevated on admit at 885 and CXR suggestive of pulmonary edema. She is diuresing well with IV Lasix. -2.7L out in first 24 hrs. I/Os net negative 1.9L. Weight is down 2 lbs. Close to euvoulemic state. Give am dose of IV Lasix. Possible transition to PO in the am. Continue low sodium diet, daily weights and strict I/Os.  2. Ischemic Cardiomyopathy: continue BB therapy. If repeat 2D echo shows significant LV dysfunction, will consider switching BB to  long acting metoprolol. Would avoid Coreg given her COPD. No ACE/ARB given CKD.   3. CAD: s/p CABG in 2010. Per records, she had a NST in 2013 that was abnormal but she was doing well and it was decided not to cath her then, given her progressive renal dysfunction (solidary kidney) and not willing to consider dialysis. She denies any recent CP. Continue medical therapy.   4. CKD: Followed by Dr. Marval Regal.  Baseline SCr ~1.70. Renal function stable with Scr at 1.70. Will monitor closely while under IV diuretic therapy.   5. Carotid Artery Disease: patient ordered to undergo routine outpatient carotid dopplers. Was ordered by Dr. Angelena Form. Will see if this can be done while impatient.   6. HTN: BP well controlled on current regimen.   7. COPD: stable. No wheezing on exam. Continue PRN Xopenex.     8. Mitral Regurgitation: will reassess valve status with echo-results pending.    LOS: 1 day    Ziah Turvey M. Rosita Fire, PA-C 04/20/2014 10:22 AM

## 2014-04-21 LAB — BASIC METABOLIC PANEL
Anion gap: 12 (ref 5–15)
BUN: 35 mg/dL — ABNORMAL HIGH (ref 6–23)
CALCIUM: 8.7 mg/dL (ref 8.4–10.5)
CHLORIDE: 95 mmol/L — AB (ref 96–112)
CO2: 28 mmol/L (ref 19–32)
Creatinine, Ser: 1.96 mg/dL — ABNORMAL HIGH (ref 0.50–1.10)
GFR calc non Af Amer: 22 mL/min — ABNORMAL LOW (ref >90)
GFR, EST AFRICAN AMERICAN: 26 mL/min — AB (ref >90)
Glucose, Bld: 98 mg/dL (ref 70–99)
Potassium: 4.1 mmol/L (ref 3.5–5.1)
SODIUM: 135 mmol/L (ref 135–145)

## 2014-04-21 NOTE — Progress Notes (Signed)
Patient ID: [name], female   DOB: Mar 26, 1927, 81 y.o.   MRN: [id number]    Subjective:  Breathing much better taking sponge bath   Objective:  Filed Vitals:   04/20/14 1345 04/20/14 2028 04/21/14 0222 04/21/14 0534  BP: 112/64 120/59 102/57 112/56  Pulse: 101 94 87 84  Temp:  98.5 F (36.9 C) 97.8 F (36.6 C) 98.1 F (36.7 C)  TempSrc:  Oral Oral Oral  Resp: 18 18 18 17   Height:      Weight:    136 lb 12.8 oz (62.052 kg)  SpO2: 93% 92% 93% 96%    Intake/Output from previous day:  Intake/Output Summary (Last 24 hours) at 04/21/14 1035 Last data filed at 04/21/14 0816  Gross per 24 hour  Intake   1020 ml  Output   2250 ml  Net  -1230 ml    Physical Exam: Affect appropriate Healthy:  appears stated age HEENT: normal Neck supple with no adenopathy JVP normal left  bruits no thyromegaly Lungs no wheezing basilar crackles  Heart:  S1/S2 MR  murmur, no rub, gallop or click PMI normal Abdomen: benighn, BS positve, no tenderness, no AAA no bruit.  No HSM or HJR Distal pulses intact with no bruits Plus one bilateral edema Neuro non-focal Skin warm and dry No muscular weakness   Lab Results: Basic Metabolic Panel:  Recent Labs  04/19/14 0420 04/19/14 0437 04/20/14 0257  NA 139 139 136  K 3.6 3.5 4.7  CL 103 104 101  CO2 22  --  28  GLUCOSE 170* 175* 118*  BUN 20 24* 26*  CREATININE 1.79*  1.78* 1.70* 1.70*  CALCIUM 9.2  --  9.2   Liver Function Tests:  Recent Labs  04/19/14 0420  AST 27  ALT 11  ALKPHOS 61  BILITOT 1.1  PROT 6.9  ALBUMIN 3.7    Recent Labs  04/19/14 0420  LIPASE 67*   CBC:  Recent Labs  04/19/14 0420 04/19/14 0437 04/20/14 0257  WBC 13.8*  --  11.5*  NEUTROABS 7.9*  --   --   HGB 15.2* 16.7* 14.0  HCT 46.3* 49.0* 42.0  MCV 91.0  --  89.2  PLT 564*  --  499*    Imaging: No results found.  Cardiac Studies:  ECG:  NSR no VT 04/21/2014    Telemetry:  ST LBBB rate 114    Echo:  4/8 reviewed Study  Conclusions  - Left ventricle: The cavity size was mildly dilated. Wall thickness was normal. Systolic function was moderately to severely reduced. The estimated ejection fraction was in the range of 30% to 35%. Diffuse hypokinesis. There is akinesis of the basalinferolateral myocardium. Features are consistent with a pseudonormal left ventricular filling pattern, with concomitant abnormal relaxation and increased filling pressure (grade 2 diastolic dysfunction). - Aortic valve: Valve mobility was restricted. There was mild to moderate stenosis. There was mild regurgitation. Valve area (VTI): 1.46 cm^2. Valve area (Vmax): 1.59 cm^2. Valve area (Vmean): 1.39 cm^2. - Mitral valve: Calcified annulus. There was severe regurgitation. Valve area by continuity equation (using LVOT flow): 1.85 cm^2. - Left atrium: The atrium was severely dilated. - Pulmonary arteries: Systolic pressure was moderately increased. PA peak pressure: 42 mm Hg (S).  Impressions:  - Global hypokinesis with akinesis of the basal inferolateral wall; overall moderate to severe LV dysfunction; grade 2 diastolic dysfunction; severe LAE; severe MR (probably ischemia mediated); mild to moderate AS; mild AI and TR; moderately elevated pulmonary pressure.  Medications:   . aspirin EC  81 mg Oral Daily  . furosemide  40 mg Intravenous BID  . heparin  5,000 Units Subcutaneous 3 times per day  . isosorbide-hydrALAZINE  1 tablet Oral BID  . metoprolol tartrate  12.5 mg Oral Daily  . potassium chloride  20 mEq Oral Daily  . pravastatin  80 mg Oral Daily  . sodium chloride  3 mL Intravenous Q12H       Assessment/Plan:  CHF:  Ischemic DCM distant aortic root replacement with CABG.  Continue iv bid lasix today and change to PO in am  Bidil added Yesterday.   CAD: no chest pain r/o Carotid:  Due for [place] trying to do in hospital but may be tough on weekend  70-62% LICA known Chol on statin     Possible d/c am   Jenkins Rouge 04/21/2014, 10:35 AM

## 2014-04-21 NOTE — Evaluation (Signed)
Physical Therapy Evaluation/ discharge Patient Details Name: [name] MRN: [id number] DOB: 10/14/1927 Today's Date: 04/21/2014   History of Present Illness  Pleasant 81 y/o female, [place], still drives during the day, developed increasing dyspnea over the past week. Hx of CABG  Clinical Impression  Pt very pleasant and mobilizing well with minimal assist for balance with gait. Pt with sats 90-92% with gait with a few brief drops to 89% but never below. Pt educated for energy conservation, breathing technique and activity with recommendation for increase in daily ambulation. Pt at baseline mobility with education complete and no further needs at this time. Will sign off with pt aware and agreeable.     Follow Up Recommendations No PT follow up    Equipment Recommendations  None recommended by PT    Recommendations for Other Services       Precautions / Restrictions Precautions Precautions: None      Mobility  Bed Mobility Overal bed mobility: Modified Independent                Transfers Overall transfer level: Modified independent                  Ambulation/Gait Ambulation/Gait assistance: Min guard Ambulation Distance (Feet): 400 Feet Assistive device: 1 person hand held assist Gait Pattern/deviations: Step-through pattern   Gait velocity interpretation: at or above normal speed for age/gender General Gait Details: pt with min HHA for balance and recommended use of cane at home for safety and fall prevention  Stairs Stairs: Yes Stairs assistance: Modified independent (Device/Increase time) Stair Management: One rail Left;Alternating pattern;Forwards Number of Stairs: 4    Wheelchair Mobility    Modified [name] (Stroke Patients Only)       Balance Overall balance assessment: Needs assistance   Sitting balance-Leahy Scale: Good       Standing balance-Leahy Scale: Poor                                Pertinent Vitals/Pain Pain Assessment: No/denies pain    Home Living Family/patient expects to be discharged to:: Private residence Living Arrangements: Alone Available Help at Discharge: Family;Available PRN/intermittently Type of Home: Mobile home Home Access: Stairs to enter   Entrance Stairs-Number of Steps: 2 Home Layout: One level Home Equipment: Cane - single point;Walker - 2 wheels;Wheelchair - manual      Prior Function Level of Independence: Independent         Comments: pt does not use DME and drives to the store and church but usually has dgtr with her for MD appointments and other outings. Pt reports no falls in the last 5 years.     Hand Dominance        Extremity/Trunk Assessment   Upper Extremity Assessment: Overall WFL for tasks assessed           Lower Extremity Assessment: Overall WFL for tasks assessed      Cervical / Trunk Assessment: Normal  Communication   Communication: No difficulties  Cognition Arousal/Alertness: Awake/alert Behavior During Therapy: WFL for tasks assessed/performed Overall Cognitive Status: Within Functional Limits for tasks assessed                      General Comments      Exercises        Assessment/Plan    PT Assessment Patent does not need any further PT  services (recommend use of cane with all ambulation)  PT Diagnosis Abnormality of gait   PT Problem List    PT Treatment Interventions     PT Goals (Current goals can be found in the Care Plan section) Acute Rehab PT Goals PT Goal Formulation: All assessment and education complete, DC therapy    Frequency     Barriers to discharge        Co-evaluation               End of Session   Activity Tolerance: Patient tolerated treatment well Patient left: in chair;with chair alarm set;with call bell/phone within reach Nurse Communication: Mobility status         Time: 9179-1505 PT Time Calculation (min) (ACUTE ONLY): 17  min   Charges:   PT Evaluation $Initial PT Evaluation Tier I: 1 Procedure     PT G CodesMelford Aase 04/21/2014, 1:56 PM Elwyn Reach, [place]

## 2014-04-22 ENCOUNTER — Inpatient Hospital Stay (HOSPITAL_COMMUNITY): Payer: Medicare Other

## 2014-04-22 DIAGNOSIS — I5043 Acute on chronic combined systolic (congestive) and diastolic (congestive) heart failure: Principal | ICD-10-CM

## 2014-04-22 MED ORDER — METOPROLOL TARTRATE 25 MG PO TABS: 12.5000 mg | ORAL_TABLET | Freq: Every day | ORAL | Status: DC

## 2014-04-22 MED ORDER — FUROSEMIDE 40 MG PO TABS: 40.0000 mg | ORAL_TABLET | Freq: Every day | ORAL | Status: DC

## 2014-04-22 MED ORDER — POTASSIUM CHLORIDE CRYS ER 10 MEQ PO TBCR: 10.0000 meq | EXTENDED_RELEASE_TABLET | Freq: Every day | ORAL | Status: DC

## 2014-04-22 MED ORDER — ISOSORB DINITRATE-HYDRALAZINE 20-37.5 MG PO TABS: 1.0000 | ORAL_TABLET | Freq: Two times a day (BID) | ORAL | Status: DC

## 2014-04-22 NOTE — Discharge Summary (Addendum)
Physician Discharge Summary      Cardiologist:  McAlhany Patient ID: LUCKY ALVERSON MRN: [id number] DOB/AGE: 79-Aug-1929 79 y.o.  Admit date: 04/19/2014 Discharge date: 04/22/2014  Admission Diagnoses:  Acute on chronic combined systolic and diastolic CHF (congestive heart failure)  Discharge Diagnoses:  Principal Problem:   Acute on chronic combined systolic and diastolic CHF (congestive heart failure) Active Problems:   Dyslipidemia   Moderate MR   Essential hypertension   CAD- RCA BMS July 2010, CABG x 2 and AO root Sept 2010   Chronic combined systolic and diastolic heart failure, NYHA class 2   PVD - bilat ICA 60-79%   COPD (chronic obstructive pulmonary disease)   CKD (chronic kidney disease), stage IV   ICM-EF 30-35% echo 04/20/14,  40% by echo June 2013, 31% by Myoview   Acute on chronic respiratory failure   Discharged Condition: stable  Hospital Course:   Pleasant 81 y/o female, lives alone [place], still drives during the day, developed increasing dyspnea over the past week. She is followed by Dr Julianne Handler, who just saw her 04/13/14 and she was doing well. No medication changes made. She has history of CAD, ischemic cardiomyopathy, chronic systolic CHF, HTN, HLD, tobacco abuse, COPD, CKD, PAD, mitral regurgitation who is here. She had an inferior MI in July 2010 treated with a bare metal stent in the RCA, followed by CABG September 2010 with LIMA to LAD and free RIMA Y graft to OM off of LIMA. She also had aortic root replacement at that time secondary to dilated aortic root. She is known to have severe PAD. Last carotid artery dopplers August 2015 with moderate 60-79% bilateral stenosis. She is followed in Nephrology by Dr. Marval Regal and her renal function is stable. Last echo June 2013 in [place] with LVEF 40%, moderate MR. Her EF by Myoview June 2013 was 31%. Her Myoview was abnormal but she was doing well and it was decided not to cath her then. This am her  dyspnea became severe and EMS was called. En route to [facility] ED she received solu medrol, and a breathing treatment. She was placed on BiPap. In the ED she received 60 mg IV lasix. She is much improved by the time I saw her- sitting up in bed, on O2 breathing comfortably. She denies missing any medication doses or unusual sodium intake. She says she has been using her prn inhaler more this week. No fever or chills, or chest pain.    She was admitted and started on IV lasix with net fluid loss of -4.2L.  Baseline SCr is 1.50-1.7.  Xopenex given.   Bidil 20/37.5 mg BID starting tonight for afterload reduction.  Echo revealed an EF of 30-35% with diffuse hypokinesis and akinesis ofthe basalinferolateral myocardium.  She reported being told to drink a lot of water by her kidney Dr. which she had been doing.  PT evaluation completed with no OP follow up needed.  She will be discharged on 40mg  PO lasix daily starting Monday.  Daily weight monitoring and low sodium diet discussed.  BMET on Wed.  Follow up with APP in the clinic 1-2 weeks.  The patient was seen by Dr. Johnsie Cancel who felt she was stable for DC home.      Consults: PT  Significant Diagnostic Studies:  PORTABLE CHEST - 1 VIEW  COMPARISON: 05/07/2013  FINDINGS: Postoperative changes in the mediastinum. Cardiac enlargement similar prior study. There is interval development of pulmonary vascular congestion with perihilar airspace  disease and interstitial disease consistent with edema. Small bilateral pleural effusions are developing. No pneumothorax. Calcification of the aorta.  IMPRESSION: Cardiac enlargement with developing vascular congestion, edema, and small effusions.   Study Conclusions  - Left ventricle: The cavity size was mildly dilated. Wall thickness was normal. Systolic function was moderately to severely reduced. The estimated ejection fraction was in the range of 30% to 35%. Diffuse hypokinesis. There is akinesis  of the basalinferolateral myocardium. Features are consistent with a pseudonormal left ventricular filling pattern, with concomitant abnormal relaxation and increased filling pressure (grade 2 diastolic dysfunction). - Aortic valve: Valve mobility was restricted. There was mild to moderate stenosis. There was mild regurgitation. Valve area (VTI): 1.46 cm^2. Valve area (Vmax): 1.59 cm^2. Valve area (Vmean): 1.39 cm^2. - Mitral valve: Calcified annulus. There was severe regurgitation. Valve area by continuity equation (using LVOT flow): 1.85 cm^2. - Left atrium: The atrium was severely dilated. - Pulmonary arteries: Systolic pressure was moderately increased. PA peak pressure: 42 mm Hg (S).  Impressions:  - Global hypokinesis with akinesis of the basal inferolateral wall; overall moderate to severe LV dysfunction; grade 2 diastolic dysfunction; severe LAE; severe MR (probably ischemia mediated); mild to moderate AS; mild AI and TR; moderately elevated pulmonary pressure.  Treatments: See above  Discharge Exam: Blood pressure 116/56, pulse 91, temperature 98.8 F (37.1 C), temperature source Oral, resp. rate 16, height 5' 6.5" (1.689 m), weight 134 lb 8 oz (61.009 kg), SpO2 94 %.   Disposition: 01-Home or Self Care      Discharge Instructions    Diet - low sodium heart healthy    Complete by:  As directed      Discharge instructions    Complete by:  As directed   Monitor your weight every morning.  If you gain 3 pounds in 24 hours, or 5 pounds in a week, call the office for instructions.     Increase activity slowly    Complete by:  As directed             Medication List    STOP taking these medications        amLODipine 10 MG tablet  Commonly known as:  NORVASC      TAKE these medications        albuterol 108 (90 BASE) MCG/ACT inhaler  Commonly known as:  PROVENTIL HFA;VENTOLIN HFA  Inhale 2 puffs into the lungs every 6 (six) hours as  needed for wheezing or shortness of breath.     aspirin EC 81 MG tablet  Take 81 mg by mouth daily.     Coenzyme Q-10 100 MG capsule  Take 100 mg by mouth daily.     furosemide 40 MG tablet  Commonly known as:  LASIX  Take 1 tablet (40 mg total) by mouth daily.  Start taking on:  04/23/2014     isosorbide-hydrALAZINE 20-37.5 MG per tablet  Commonly known as:  BIDIL  Take 1 tablet by mouth 2 (two) times daily.     Melatonin 5 MG Tabs  Take 15 mg by mouth at bedtime as needed (sleep).     metoprolol tartrate 25 MG tablet  Commonly known as:  LOPRESSOR  Take 0.5 tablets (12.5 mg total) by mouth daily.     nitroGLYCERIN 0.4 MG SL tablet  Commonly known as:  NITROSTAT  Place 1 tablet (0.4 mg total) under the tongue every 5 (five) minutes as needed.     potassium chloride 10 MEQ tablet  Commonly known as:  K-DUR,KLOR-CON  Take 1 tablet (10 mEq total) by mouth daily.     pravastatin 80 MG tablet  Commonly known as:  PRAVACHOL  TAKE ONE TABLET BY MOUTH ONCE DAILY IN THE EVENING       Follow-up Information    Follow up with Lauree Chandler, MD.   Specialty:  Cardiology   Why:  The office will call you with the follow up appt date and time.     Contact information:   [address] [place] [address] [phone]       Follow up with Labs On 04/25/2014.   Why:  Please go to the office, or any lab, on April 13 at any time so we can check kidney function.    Contact information:   [address] [place] [address] [phone]     Greater than 30 minutes was spent completing the patient's discharge.    SignedTarri Fuller, [place] 04/22/2014, 9:34 AM

## 2014-04-22 NOTE — Progress Notes (Signed)
Subjective: No SOB, orthopnea  Objective: Vital signs in last 24 hours: Temp:  [98.1 F (36.7 C)-98.8 F (37.1 C)] 98.8 F (37.1 C) (04/10 0500) Pulse Rate:  [82-91] 91 (04/10 0500) Resp:  [16] 16 (04/10 0500) BP: (111-120)/(55-63) 116/56 mmHg (04/10 0500) SpO2:  [90 %-94 %] 94 % (04/10 0500) Weight:  [134 lb 8 oz (61.009 kg)] 134 lb 8 oz (61.009 kg) (04/10 0500) Last BM Date: 04/20/14  Intake/Output from previous day: 04/09 0701 - 04/10 0700 In: 900 [P.O.:900] Out: 1750 [Urine:1750] Intake/Output this shift:    Medications Current Facility-Administered Medications  Medication Dose Route Frequency Provider Last Rate Last Dose  . 0.9 %  sodium chloride infusion  250 mL Intravenous PRN Erlene Quan, PA-C      . acetaminophen (TYLENOL) tablet 650 mg  650 mg Oral Q4H PRN Erlene Quan, PA-C      . aspirin EC tablet 81 mg  81 mg Oral Daily Erlene Quan, PA-C   81 mg at 04/21/14 1150  . furosemide (LASIX) injection 40 mg  40 mg Intravenous BID Pixie Casino, MD   40 mg at 04/21/14 1712  . heparin injection 5,000 Units  5,000 Units Subcutaneous 3 times per day Erlene Quan, PA-C   5,000 Units at 04/22/14 0700  . isosorbide-hydrALAZINE (BIDIL) 20-37.5 MG per tablet 1 tablet  1 tablet Oral BID Pixie Casino, MD   1 tablet at 04/21/14 2256  . levalbuterol (XOPENEX) nebulizer solution 0.63 mg  0.63 mg Nebulization Q6H PRN Erlene Quan, PA-C      . metoprolol tartrate (LOPRESSOR) tablet 12.5 mg  12.5 mg Oral Daily Erlene Quan, PA-C   12.5 mg at 04/21/14 1151  . nitroGLYCERIN (NITROSTAT) SL tablet 0.4 mg  0.4 mg Sublingual Q5 min PRN Everlene Balls, MD      . ondansetron (ZOFRAN) injection 4 mg  4 mg Intravenous Q6H PRN Doreene Burke Kilroy, PA-C      . potassium chloride SA (K-DUR,KLOR-CON) CR tablet 20 mEq  20 mEq Oral Daily Erlene Quan, PA-C   20 mEq at 04/21/14 1150  . pravastatin (PRAVACHOL) tablet 80 mg  80 mg Oral Daily Erlene Quan, PA-C   80 mg at 04/21/14 1152  . sodium  chloride 0.9 % injection 3 mL  3 mL Intravenous Q12H Erlene Quan, PA-C   3 mL at 04/21/14 2256  . sodium chloride 0.9 % injection 3 mL  3 mL Intravenous PRN Erlene Quan, PA-C      . traZODone (DESYREL) tablet 50 mg  50 mg Oral QHS PRN Lamar Sprinkles, MD   50 mg at 04/20/14 0239    PE: General appearance: alert, cooperative and no distress Lungs: clear to auscultation bilaterally Heart: regular rate and rhythm, S1, S2 2/6 sys MM Extremities: No LEE Pulses: 2+ and symmetric Skin: Warm and dry Neurologic: Grossly normal  Lab Results:   Recent Labs  04/20/14 0257  WBC 11.5*  HGB 14.0  HCT 42.0  PLT 499*   BMET  Recent Labs  04/20/14 0257 04/21/14 1200  NA 136 135  K 4.7 4.1  CL 101 95*  CO2 28 28  GLUCOSE 118* 98  BUN 26* 35*  CREATININE 1.70* 1.96*  CALCIUM 9.2 8.7    Assessment/Plan  Principal Problem:   Acute on chronic combined systolic and diastolic CHF (congestive heart failure) Net fluids: -0.9L/-4.2L.  Scr up.  No lasix today.  Restart PO tomorrow  at home.  Was on 20mg  daily.  EF 30-35%.  She reports drinking a lot of water prior to admission because of her kidneys.  We discussed daily weight monitoring.  Will try 40mg  daily.       Dyslipidemia   Moderate MR   Essential hypertension  Controlled   CAD- RCA BMS July 2010, CABG x 2 and AO root Sept 2010  No CP     PVD - bilat ICA 60-79%  OP dopplers already ordered   COPD (chronic obstructive pulmonary disease)   CKD (chronic kidney disease), stage IV  SCr up to 1.96.  Baseline 1.5- 1.7   ICM-EF 40% by echo June 2013, 31% by Myoview.  30-35% 04/20/14   Acute on chronic respiratory failure  Improved.  Has been off O2 the last 24 hours.   HLD   Statin  DC home today.  Follow up with APP/[facility].  BMET Wednesday   LOS: 3 days    HAGER, BRYAN PA-C 04/22/2014 7:19 AM   Patient examined chart reviewed rounded on yesterday. Lungs clear CHF much improved  Lasix 40 mg daily  CAD stable no chest  pain previous CABG and RCA stent.  Outpatient f/u Carotid duplex    Jenkins Rouge

## 2014-04-22 NOTE — Progress Notes (Signed)
At 1146 pt d/c to home. All d/c instructions explained and given by charge nurse Tai.  D/c off floor via w/c by assigned NT to awaiting transport.

## 2014-04-23 ENCOUNTER — Telehealth: Payer: Self-pay | Admitting: Cardiovascular Disease

## 2014-04-23 DIAGNOSIS — I5040 Unspecified combined systolic (congestive) and diastolic (congestive) heart failure: Secondary | ICD-10-CM

## 2014-04-23 NOTE — Addendum Note (Signed)
Addended by: Thompson Grayer on: 04/23/2014 12:45 PM   Modules accepted: Orders

## 2014-04-23 NOTE — Telephone Encounter (Signed)
New message         Pt would like to know if she needs to take new medication she was recently put on

## 2014-04-23 NOTE — Telephone Encounter (Signed)
Spoke with [name]. She was sent home on Bidil and cannot afford. She did pick up 15 day supply at pharmacy and I instructed her to start taking as directed.  Will send to Dr. Angelena Form for possible cheaper alternative.  [name] has post hospital appt with Ignacia Bayley, NP on May 07, 2014 and is aware of this appt.  She is scheduled for BMP on April 25, 2014 and I have changed carotid dopplers previously scheduled for April 26, 2014 to April 13.  Lipids also ordered for April 25, 2014 but I will cancel LFT's as they were checked during recent hospitalization

## 2014-04-23 NOTE — Telephone Encounter (Signed)
Phone busy. Will continue to try to reach pt.

## 2014-04-23 NOTE — Telephone Encounter (Signed)
Her medications can be changed when she sees Ignacia Bayley in 14 days. cdm

## 2014-04-23 NOTE — Telephone Encounter (Signed)
Spoke with pt and she will discuss cheaper alternative with Ignacia Bayley, PA at appt.

## 2014-04-25 ENCOUNTER — Other Ambulatory Visit (INDEPENDENT_AMBULATORY_CARE_PROVIDER_SITE_OTHER): Payer: Medicare Other | Admitting: *Deleted

## 2014-04-25 ENCOUNTER — Other Ambulatory Visit: Payer: Medicare Other

## 2014-04-25 ENCOUNTER — Ambulatory Visit (HOSPITAL_COMMUNITY): Payer: Medicare Other | Attending: Cardiovascular Disease | Admitting: Cardiology

## 2014-04-25 DIAGNOSIS — J449 Chronic obstructive pulmonary disease, unspecified: Secondary | ICD-10-CM | POA: Insufficient documentation

## 2014-04-25 DIAGNOSIS — I10 Essential (primary) hypertension: Secondary | ICD-10-CM | POA: Diagnosis not present

## 2014-04-25 DIAGNOSIS — Z87891 Personal history of nicotine dependence: Secondary | ICD-10-CM | POA: Insufficient documentation

## 2014-04-25 DIAGNOSIS — Z951 Presence of aortocoronary bypass graft: Secondary | ICD-10-CM | POA: Insufficient documentation

## 2014-04-25 DIAGNOSIS — I5040 Unspecified combined systolic (congestive) and diastolic (congestive) heart failure: Secondary | ICD-10-CM

## 2014-04-25 DIAGNOSIS — I739 Peripheral vascular disease, unspecified: Secondary | ICD-10-CM | POA: Diagnosis not present

## 2014-04-25 DIAGNOSIS — I779 Disorder of arteries and arterioles, unspecified: Secondary | ICD-10-CM

## 2014-04-25 DIAGNOSIS — E785 Hyperlipidemia, unspecified: Secondary | ICD-10-CM

## 2014-04-25 DIAGNOSIS — I2510 Atherosclerotic heart disease of native coronary artery without angina pectoris: Secondary | ICD-10-CM | POA: Diagnosis not present

## 2014-04-25 DIAGNOSIS — I6523 Occlusion and stenosis of bilateral carotid arteries: Secondary | ICD-10-CM | POA: Diagnosis not present

## 2014-04-25 LAB — BASIC METABOLIC PANEL
BUN: 32 mg/dL — ABNORMAL HIGH (ref 6–23)
CALCIUM: 9.7 mg/dL (ref 8.4–10.5)
CO2: 28 meq/L (ref 19–32)
Chloride: 99 mEq/L (ref 96–112)
Creatinine, Ser: 1.97 mg/dL — ABNORMAL HIGH (ref 0.40–1.20)
GFR: 25.61 mL/min — ABNORMAL LOW (ref >60.00)
Glucose, Bld: 105 mg/dL — ABNORMAL HIGH (ref 70–99)
Potassium: 3.9 mEq/L (ref 3.5–5.1)
Sodium: 137 mEq/L (ref 135–145)

## 2014-04-25 LAB — LIPID PANEL
Cholesterol: 182 mg/dL (ref 0–200)
HDL: 75.5 mg/dL (ref >39.00)
LDL Cholesterol: 88 mg/dL (ref 0–99)
NONHDL: 106.5
Total CHOL/HDL Ratio: 2
Triglycerides: 92 mg/dL (ref 0.0–149.0)
VLDL: 18.4 mg/dL (ref 0.0–40.0)

## 2014-04-25 LAB — CULTURE, BLOOD (ROUTINE X 2)
CULTURE: NO GROWTH
Culture: NO GROWTH

## 2014-04-25 NOTE — Progress Notes (Signed)
Carotid duplex performed 

## 2014-04-26 ENCOUNTER — Other Ambulatory Visit: Payer: Medicare Other

## 2014-04-26 ENCOUNTER — Encounter (HOSPITAL_COMMUNITY): Payer: Medicare Other

## 2014-04-26 ENCOUNTER — Other Ambulatory Visit: Payer: Self-pay | Admitting: Cardiovascular Disease

## 2014-04-26 ENCOUNTER — Other Ambulatory Visit (HOSPITAL_COMMUNITY): Payer: Medicare Other

## 2014-05-03 ENCOUNTER — Emergency Department (HOSPITAL_COMMUNITY): Payer: Medicare Other

## 2014-05-03 ENCOUNTER — Emergency Department (HOSPITAL_COMMUNITY)
Admission: EM | Admit: 2014-05-03 | Discharge: 2014-05-03 | Disposition: A | Discharge status: Home / Self Care | Payer: Medicare Other | Source: Home / Self Care | Attending: Emergency Medicine | Admitting: Emergency Medicine

## 2014-05-03 ENCOUNTER — Encounter (HOSPITAL_COMMUNITY): Payer: Self-pay | Admitting: Emergency Medicine

## 2014-05-03 DIAGNOSIS — Z8673 Personal history of transient ischemic attack (TIA), and cerebral infarction without residual deficits: Secondary | ICD-10-CM

## 2014-05-03 DIAGNOSIS — I739 Peripheral vascular disease, unspecified: Secondary | ICD-10-CM | POA: Diagnosis present

## 2014-05-03 DIAGNOSIS — I5022 Chronic systolic (congestive) heart failure: Secondary | ICD-10-CM | POA: Insufficient documentation

## 2014-05-03 DIAGNOSIS — I252 Old myocardial infarction: Secondary | ICD-10-CM

## 2014-05-03 DIAGNOSIS — R0602 Shortness of breath: Secondary | ICD-10-CM | POA: Insufficient documentation

## 2014-05-03 DIAGNOSIS — J9811 Atelectasis: Secondary | ICD-10-CM | POA: Insufficient documentation

## 2014-05-03 DIAGNOSIS — I340 Nonrheumatic mitral (valve) insufficiency: Secondary | ICD-10-CM | POA: Diagnosis present

## 2014-05-03 DIAGNOSIS — Z86010 Personal history of colonic polyps: Secondary | ICD-10-CM | POA: Insufficient documentation

## 2014-05-03 DIAGNOSIS — J449 Chronic obstructive pulmonary disease, unspecified: Secondary | ICD-10-CM

## 2014-05-03 DIAGNOSIS — R002 Palpitations: Secondary | ICD-10-CM

## 2014-05-03 DIAGNOSIS — Z8669 Personal history of other diseases of the nervous system and sense organs: Secondary | ICD-10-CM

## 2014-05-03 DIAGNOSIS — R011 Cardiac murmur, unspecified: Secondary | ICD-10-CM | POA: Insufficient documentation

## 2014-05-03 DIAGNOSIS — Z862 Personal history of diseases of the blood and blood-forming organs and certain disorders involving the immune mechanism: Secondary | ICD-10-CM

## 2014-05-03 DIAGNOSIS — E785 Hyperlipidemia, unspecified: Secondary | ICD-10-CM | POA: Diagnosis present

## 2014-05-03 DIAGNOSIS — I130 Hypertensive heart and chronic kidney disease with heart failure and stage 1 through stage 4 chronic kidney disease, or unspecified chronic kidney disease: Secondary | ICD-10-CM | POA: Diagnosis not present

## 2014-05-03 DIAGNOSIS — N184 Chronic kidney disease, stage 4 (severe): Secondary | ICD-10-CM

## 2014-05-03 DIAGNOSIS — I129 Hypertensive chronic kidney disease with stage 1 through stage 4 chronic kidney disease, or unspecified chronic kidney disease: Secondary | ICD-10-CM

## 2014-05-03 DIAGNOSIS — R5383 Other fatigue: Secondary | ICD-10-CM

## 2014-05-03 DIAGNOSIS — Z79899 Other long term (current) drug therapy: Secondary | ICD-10-CM

## 2014-05-03 DIAGNOSIS — H269 Unspecified cataract: Secondary | ICD-10-CM | POA: Diagnosis present

## 2014-05-03 DIAGNOSIS — I5043 Acute on chronic combined systolic (congestive) and diastolic (congestive) heart failure: Secondary | ICD-10-CM | POA: Diagnosis present

## 2014-05-03 DIAGNOSIS — Z87891 Personal history of nicotine dependence: Secondary | ICD-10-CM | POA: Insufficient documentation

## 2014-05-03 DIAGNOSIS — Z9861 Coronary angioplasty status: Secondary | ICD-10-CM | POA: Insufficient documentation

## 2014-05-03 DIAGNOSIS — E876 Hypokalemia: Secondary | ICD-10-CM | POA: Diagnosis present

## 2014-05-03 DIAGNOSIS — I110 Hypertensive heart disease with heart failure: Secondary | ICD-10-CM

## 2014-05-03 DIAGNOSIS — I2510 Atherosclerotic heart disease of native coronary artery without angina pectoris: Secondary | ICD-10-CM | POA: Diagnosis present

## 2014-05-03 DIAGNOSIS — I248 Other forms of acute ischemic heart disease: Secondary | ICD-10-CM | POA: Diagnosis present

## 2014-05-03 DIAGNOSIS — Z951 Presence of aortocoronary bypass graft: Secondary | ICD-10-CM

## 2014-05-03 DIAGNOSIS — Z7982 Long term (current) use of aspirin: Secondary | ICD-10-CM

## 2014-05-03 DIAGNOSIS — E049 Nontoxic goiter, unspecified: Secondary | ICD-10-CM | POA: Diagnosis present

## 2014-05-03 DIAGNOSIS — I255 Ischemic cardiomyopathy: Secondary | ICD-10-CM | POA: Diagnosis present

## 2014-05-03 DIAGNOSIS — Z955 Presence of coronary angioplasty implant and graft: Secondary | ICD-10-CM

## 2014-05-03 LAB — BASIC METABOLIC PANEL
ANION GAP: 13 (ref 5–15)
BUN: 24 mg/dL — ABNORMAL HIGH (ref 6–23)
CALCIUM: 9 mg/dL (ref 8.4–10.5)
CO2: 23 mmol/L (ref 19–32)
Chloride: 103 mmol/L (ref 96–112)
Creatinine, Ser: 1.84 mg/dL — ABNORMAL HIGH (ref 0.50–1.10)
GFR calc Af Amer: 28 mL/min — ABNORMAL LOW (ref >90)
GFR, EST NON AFRICAN AMERICAN: 24 mL/min — AB (ref >90)
GLUCOSE: 130 mg/dL — AB (ref 70–99)
POTASSIUM: 4 mmol/L (ref 3.5–5.1)
Sodium: 139 mmol/L (ref 135–145)

## 2014-05-03 LAB — CBC
HCT: 41.4 % (ref 36.0–46.0)
Hemoglobin: 13.6 g/dL (ref 12.0–15.0)
MCH: 29.8 pg (ref 26.0–34.0)
MCHC: 32.9 g/dL (ref 30.0–36.0)
MCV: 90.8 fL (ref 78.0–100.0)
Platelets: 484 10*3/uL — ABNORMAL HIGH (ref 150–400)
RBC: 4.56 MIL/uL (ref 3.87–5.11)
RDW: 15 % (ref 11.5–15.5)
WBC: 9 10*3/uL (ref 4.0–10.5)

## 2014-05-03 LAB — I-STAT TROPONIN, ED: TROPONIN I, POC: 0.02 ng/mL (ref 0.00–0.08)

## 2014-05-03 LAB — BRAIN NATRIURETIC PEPTIDE: B NATRIURETIC PEPTIDE 5: 682.5 pg/mL — AB (ref 0.0–100.0)

## 2014-05-03 NOTE — ED Notes (Signed)
Pt ambulated again with Bailey Mech PA, Dr.Jacubowitz, and this RN. sats started at 94% on room air; decreased to 87%. Steady gait noted. Respirations even and unlabored.

## 2014-05-03 NOTE — ED Provider Notes (Signed)
Patient complain of dyspnea and feeling of rapid heartbeat starting yesterday evening. Resolved upon arrival here. She is presently asymptomatic. She denies having had any chest pain denies cough denies fever. On exam alert no distress HEENT exam no facial asymmetry neck supple no bruit no JVD heart regular rate and rhythm lungs clear auscultation abdomen nondistended nontender extremities without edema. Chest x-ray viewed by me. Doubt pneumonia clinically. No fever no cough no leukocytosis. Doubt pulmonary embolism. Symptoms have resolved. Doubt acute congestive heart failure. BNP is lower than prior baseline. No shortness of breath presently. She lies supine without dyspnea  Orlie Dakin, MD 05/03/14 [phone]

## 2014-05-03 NOTE — Discharge Instructions (Signed)
Follow-up with your cardiologist on Monday as scheduled. Return with any worsening symptoms. Palpitations A palpitation is the feeling that your heartbeat is irregular or is faster than normal. It may feel like your heart is fluttering or skipping a beat. Palpitations are usually not a serious problem. However, in some cases, you may need further medical evaluation. CAUSES  Palpitations can be caused by:  Smoking.  Caffeine or other stimulants, such as diet pills or energy drinks.  Alcohol.  Stress and anxiety.  Strenuous physical activity.  Fatigue.  Certain medicines.  Heart disease, especially if you have a history of irregular heart rhythms (arrhythmias), such as atrial fibrillation, atrial flutter, or supraventricular tachycardia.  An improperly working pacemaker or defibrillator. DIAGNOSIS  To find the cause of your palpitations, your health care provider will take your medical history and perform a physical exam. Your health care provider may also have you take a test called an ambulatory electrocardiogram (ECG). An ECG records your heartbeat patterns over a 24-hour period. You may also have other tests, such as:  Transthoracic echocardiogram (TTE). During echocardiography, sound waves are used to evaluate how blood flows through your heart.  Transesophageal echocardiogram (TEE).  Cardiac monitoring. This allows your health care provider to monitor your heart rate and rhythm in real time.  Holter monitor. This is a portable device that records your heartbeat and can help diagnose heart arrhythmias. It allows your health care provider to track your heart activity for several days, if needed.  Stress tests by exercise or by giving medicine that makes the heart beat faster. TREATMENT  Treatment of palpitations depends on the cause of your symptoms and can vary greatly. Most cases of palpitations do not require any treatment other than time, relaxation, and monitoring your  symptoms. Other causes, such as atrial fibrillation, atrial flutter, or supraventricular tachycardia, usually require further treatment. HOME CARE INSTRUCTIONS   Avoid:  Caffeinated coffee, tea, soft drinks, diet pills, and energy drinks.  Chocolate.  Alcohol.  Stop smoking if you smoke.  Reduce your stress and anxiety. Things that can help you relax include:  A method of controlling things in your body, such as your heartbeats, with your mind (biofeedback).  Yoga.  Meditation.  Physical activity such as swimming, jogging, or walking.  Get plenty of rest and sleep. SEEK MEDICAL CARE IF:   You continue to have a fast or irregular heartbeat beyond 24 hours.  Your palpitations occur more often. SEEK IMMEDIATE MEDICAL CARE IF:  You have chest pain or shortness of breath.  You have a severe headache.  You feel dizzy or you faint. MAKE SURE YOU:  Understand these instructions.  Will watch your condition.  Will get help right away if you are not doing well or get worse. Document Released: 12/27/1999 Document Revised: 01/03/2013 Document Reviewed: 02/27/2011 [facility] Patient Information 2015 [place], [place]. This information is not intended to replace advice given to you by your health care provider. Make sure you discuss any questions you have with your health care provider. Chest Pain (Nonspecific) It is often hard to give a specific diagnosis for the cause of chest pain. There is always a chance that your pain could be related to something serious, such as a heart attack or a blood clot in the lungs. You need to follow up with your health care provider for further evaluation. CAUSES   Heartburn.  Pneumonia or bronchitis.  Anxiety or stress.  Inflammation around your heart (pericarditis) or lung (pleuritis or pleurisy).  A blood clot in the lung.  A collapsed lung (pneumothorax). It can develop suddenly on its own (spontaneous pneumothorax) or from trauma to the  chest.  Shingles infection (herpes zoster virus). The chest wall is composed of bones, muscles, and cartilage. Any of these can be the source of the pain.  The bones can be bruised by injury.  The muscles or cartilage can be strained by coughing or overwork.  The cartilage can be affected by inflammation and become sore (costochondritis). DIAGNOSIS  Lab tests or other studies may be needed to find the cause of your pain. Your health care provider may have you take a test called an ambulatory electrocardiogram (ECG). An ECG records your heartbeat patterns over a 24-hour period. You may also have other tests, such as:  Transthoracic echocardiogram (TTE). During echocardiography, sound waves are used to evaluate how blood flows through your heart.  Transesophageal echocardiogram (TEE).  Cardiac monitoring. This allows your health care provider to monitor your heart rate and rhythm in real time.  Holter monitor. This is a portable device that records your heartbeat and can help diagnose heart arrhythmias. It allows your health care provider to track your heart activity for several days, if needed.  Stress tests by exercise or by giving medicine that makes the heart beat faster. TREATMENT   Treatment depends on what may be causing your chest pain. Treatment may include:  Acid blockers for heartburn.  Anti-inflammatory medicine.  Pain medicine for inflammatory conditions.  Antibiotics if an infection is present.  You may be advised to change lifestyle habits. This includes stopping smoking and avoiding alcohol, caffeine, and chocolate.  You may be advised to keep your head raised (elevated) when sleeping. This reduces the chance of acid going backward from your stomach into your esophagus. Most of the time, nonspecific chest pain will improve within 2-3 days with rest and mild pain medicine.  HOME CARE INSTRUCTIONS   If antibiotics were prescribed, take them as directed. Finish them  even if you start to feel better.  For the next few days, avoid physical activities that bring on chest pain. Continue physical activities as directed.  Do not use any tobacco products, including cigarettes, chewing tobacco, or electronic cigarettes.  Avoid drinking alcohol.  Only take medicine as directed by your health care provider.  Follow your health care provider's suggestions for further testing if your chest pain does not go away.  Keep any follow-up appointments you made. If you do not go to an appointment, you could develop lasting (chronic) problems with pain. If there is any problem keeping an appointment, call to reschedule. SEEK MEDICAL CARE IF:   Your chest pain does not go away, even after treatment.  You have a rash with blisters on your chest.  You have a fever. SEEK IMMEDIATE MEDICAL CARE IF:   You have increased chest pain or pain that spreads to your arm, neck, jaw, back, or abdomen.  You have shortness of breath.  You have an increasing cough, or you cough up blood.  You have severe back or abdominal pain.  You feel nauseous or vomit.  You have severe weakness.  You faint.  You have chills. This is an emergency. Do not wait to see if the pain will go away. Get medical help at once. Call your local emergency services (911 in [place] Do not drive yourself to the hospital. MAKE SURE YOU:   Understand these instructions.  Will watch your condition.  Will get help  right away if you are not doing well or get worse. Document Released: 10/08/2004 Document Revised: 01/03/2013 Document Reviewed: 08/04/2007 [facility] Patient Information 2015 [place], [place]. This information is not intended to replace advice given to you by your health care provider. Make sure you discuss any questions you have with your health care provider. Shortness of Breath Shortness of breath means you have trouble breathing. It could also mean that you have a medical problem. You should  get immediate medical care for shortness of breath. CAUSES   Not enough oxygen in the air such as with high altitudes or a smoke-filled room.  Certain lung diseases, infections, or problems.  Heart disease or conditions, such as angina or heart failure.  Low red blood cells (anemia).  Poor physical fitness, which can cause shortness of breath when you exercise.  Chest or back injuries or stiffness.  Being overweight.  Smoking.  Anxiety, which can make you feel like you are not getting enough air. DIAGNOSIS  Serious medical problems can often be found during your physical exam. Tests may also be done to determine why you are having shortness of breath. Tests may include:  Chest X-rays.  Lung function tests.  Blood tests.  An electrocardiogram (ECG).  An ambulatory electrocardiogram. An ambulatory ECG records your heartbeat patterns over a 24-hour period.  Exercise testing.  A transthoracic echocardiogram (TTE). During echocardiography, sound waves are used to evaluate how blood flows through your heart.  A transesophageal echocardiogram (TEE).  Imaging scans. Your health care provider may not be able to find a cause for your shortness of breath after your exam. In this case, it is important to have a follow-up exam with your health care provider as directed.  TREATMENT  Treatment for shortness of breath depends on the cause of your symptoms and can vary greatly. HOME CARE INSTRUCTIONS   Do not smoke. Smoking is a common cause of shortness of breath. If you smoke, ask for help to quit.  Avoid being around chemicals or things that may bother your breathing, such as paint fumes and dust.  Rest as needed. Slowly resume your usual activities.  If medicines were prescribed, take them as directed for the full length of time directed. This includes oxygen and any inhaled medicines.  Keep all follow-up appointments as directed by your health care provider. SEEK MEDICAL  CARE IF:   Your condition does not improve in the time expected.  You have a hard time doing your normal activities even with rest.  You have any new symptoms. SEEK IMMEDIATE MEDICAL CARE IF:   Your shortness of breath gets worse.  You feel light-headed, faint, or develop a cough not controlled with medicines.  You start coughing up blood.  You have pain with breathing.  You have chest pain or pain in your arms, shoulders, or abdomen.  You have a fever.  You are unable to walk up stairs or exercise the way you normally do. MAKE SURE YOU:  Understand these instructions.  Will watch your condition.  Will get help right away if you are not doing well or get worse. Document Released: 09/23/2000 Document Revised: 01/03/2013 Document Reviewed: 03/16/2011 [facility] Patient Information 2015 [place], [place]. This information is not intended to replace advice given to you by your health care provider. Make sure you discuss any questions you have with your health care provider.

## 2014-05-03 NOTE — ED Notes (Signed)
Ambulated patient in hall. Patient O2 level prior to ambulation was 91% w/ HR 93. During ambulation patient O2 level dropped to 86-87% w/HR 118. Patient complained of fatigue but no dizziness during ambulation. Patient held on to me for support during ambulation.

## 2014-05-03 NOTE — ED Notes (Signed)
Pt placed on 2L by PA; sats at 89-91% on room air; sats increased to 96% on 2L

## 2014-05-03 NOTE — ED Notes (Signed)
Per EMS: pt from home for eval of substernal cp that started yesterday. Pt states pressure in chest was intermittent and she became sob, pt with hx of MI and open heart surgery. EMS reports pt given 3 nitro and 4mg  of zofran en route, pt did not receive any aspirin due to not being able to chew them per EMS report. EMS states EKG shows Left BBB and minimal elevation noted to lateral fields. Repeat EKG done upon ED arrival, pt in nad.

## 2014-05-03 NOTE — ED Provider Notes (Signed)
CSN: [id number]     Arrival date & time 05/03/14  1156 History   First MD Initiated Contact with Patient 05/03/14 1159     Chief Complaint  Patient presents with  . Chest Pain     (Consider location/radiation/quality/duration/timing/severity/associated sxs/prior Treatment) HPI Comments: 81 y/o F presenting via EMS from home c/o gradual onset, non-radiating mid-sternal chest discomfort, palpitations, and shortness of breath beginning yesterday evening. SOB worse on exertion. States she feels fatigued. En route, was given 3 SL nitro with complete resolution of symptoms. Admits to nausea which resolved with 4 mg zofran from EMS. Recent admission for CHF exacerbation. Denies chest pain, described it more as a comfort. Denies fever, chills, diaphoresis, cough, extremity edema.  The history is provided by the patient.    Past Medical History  Diagnosis Date  . CAD (coronary artery disease)     a. 07/2008 Inf STEMI->RCA BMS;  b. 09/2008 CABGx2: LIMA->LAD, RIMA->OM as Y from [facility];  b. 06/2011 MV EF 31%, bsaal to dist ant infarct, inf/inflat infarct with mild to mod peri-infarct ischemia [facility]).  . Ischemic cardiomyopathy     a. 06/2011 Echo: EF 40%, inf/inflat AK and lat HK, aortic sclerosis w/ mild AI, Mild to mod MR (prev mod-sev), Gr 1 DD, mild TR, RVSP 80mmHg  . Systolic CHF, chronic     a. 06/2011 EF 40%  . Hypertension   . Hyperlipidemia   . Hiatal hernia     large  . Tobacco abuse   . Cerebrovascular disease   . COPD (chronic obstructive pulmonary disease)     presumed   . Arterial disease     peripheral arterial disease with claudication - bilateral SFA disease with ABI's 0.4-0.5 bilaterally   . Mitral regurgitation     a. prev Mod-Sev, read as mild to mod by echo 06/2011 [facility]).  . CKD (chronic kidney disease), stage IV     born with 1 kidney  . MI (myocardial infarction) 2010  . Thyroid nodule 10/2012    left dominant nodule cytology:non-neoplastic goiter.    . Cataracts, both eyes     not surgical  . Personal history of colonic polyps - adenomas 05/09/2013  . Anemia   . Systolic HF (heart failure)   . Hypertensive renal disease   . Hypertensive heart disease with congestive heart failure   . Aorta aneurysm   . LVH (left ventricular hypertrophy)   . Arteriosclerotic vascular disease   . Bronchiectasis   . Atherosclerosis of abdominal aorta   . Heart murmur   . Shortness of breath dyspnea    Past Surgical History  Procedure Laterality Date  . Laprotomy for bowel obstruction    . Coronary artery bypass graft  09/2008    aortic thoraic anuerysm resecton 2010.   Marland Kitchen Coronary stent placement  07/2008    BMS to RCA  . Flexible sigmoidoscopy N/A 05/09/2013    Procedure: FLEXIBLE SIGMOIDOSCOPY;  Surgeon: [name], MD;  Location: [place];  Service: Endoscopy;  Laterality: N/A;  with possible hemorrhoid banding use gastrosope unsdeated  . Appendectomy    . Oophorectomy      one removed in 70's  . Hemorrhoid surgery    . Colonoscopy N/A 05/12/2013    Procedure: COLONOSCOPY;  Surgeon: [name], MD;  Location: [facility];  Service: Endoscopy;  Laterality: N/A;   Family History  Problem Relation Age of Onset  . Coronary artery disease Mother     extensive family  history; several other family members.  . Heart disease Mother     CHF  . Coronary artery disease Brother   . Leukemia Sister   . Leukemia Sister   . Leukemia Father   . Colon cancer Neg Hx   . Pancreatic cancer Neg Hx   . Stomach cancer Neg Hx   . Rectal cancer Neg Hx   . Kidney cancer Brother   . Heart failure Mother   . Heart attack Sister    History  Substance Use Topics  . Smoking status: Former Smoker -- 1.00 packs/day for 60 years    Types: Cigarettes    Quit date: 07/12/2008  . Smokeless tobacco: Never Used  . Alcohol Use: No   OB History    No data available     Review of Systems  Constitutional: Positive for fatigue.  Respiratory: Positive  for shortness of breath.   Cardiovascular: Positive for palpitations.  All other systems reviewed and are negative.     Allergies  Review of patient's allergies indicates no known allergies.  Home Medications   Prior to Admission medications   Medication Sig Start Date End Date Taking? Authorizing Provider  albuterol (PROVENTIL HFA;VENTOLIN HFA) 108 (90 BASE) MCG/ACT inhaler Inhale 2 puffs into the lungs every 6 (six) hours as needed for wheezing or shortness of breath.   Yes Historical Provider, MD  amLODipine (NORVASC) 10 MG tablet TAKE ONE TABLET BY MOUTH ONCE DAILY 04/26/14  Yes Burnell Blanks, MD  aspirin EC 81 MG tablet Take 81 mg by mouth daily.   Yes Historical Provider, MD  Coenzyme Q-10 100 MG capsule Take 100 mg by mouth daily.     Yes Historical Provider, MD  furosemide (LASIX) 40 MG tablet Take 1 tablet (40 mg total) by mouth daily. 04/23/14  Yes Einar Pheasant Hager, PA-C  isosorbide-hydrALAZINE (BIDIL) 20-37.5 MG per tablet Take 1 tablet by mouth 2 (two) times daily. 04/22/14  Yes Brett Canales, PA-C  Melatonin 5 MG TABS Take 15 mg by mouth at bedtime as needed (sleep).   Yes Historical Provider, MD  metoprolol tartrate (LOPRESSOR) 25 MG tablet Take 0.5 tablets (12.5 mg total) by mouth daily. 04/22/14  Yes Brett Canales, PA-C  nitroGLYCERIN (NITROSTAT) 0.4 MG SL tablet Place 1 tablet (0.4 mg total) under the tongue every 5 (five) minutes as needed. Patient taking differently: Place 0.4 mg under the tongue every 5 (five) minutes as needed for chest pain.  04/13/14  Yes Burnell Blanks, MD  potassium chloride SA (K-DUR,KLOR-CON) 10 MEQ tablet Take 1 tablet (10 mEq total) by mouth daily. 04/22/14  Yes Brett Canales, PA-C  pravastatin (PRAVACHOL) 80 MG tablet TAKE ONE TABLET BY MOUTH ONCE DAILY IN THE EVENING 07/21/13  Yes Burnell Blanks, MD   BP 136/60 mmHg  Pulse 88  Temp(Src) 98.2 F (36.8 C) (Oral)  Resp 24  Ht 5\' 6"  (1.676 m)  Wt 139 lb (63.05 kg)  BMI 22.45  kg/m2  SpO2 93% Physical Exam  Constitutional: She is oriented to person, place, and time. She appears well-developed and well-nourished. No distress.  HENT:  Head: Normocephalic and atraumatic.  Mouth/Throat: Oropharynx is clear and moist.  Eyes: Conjunctivae and EOM are normal. Pupils are equal, round, and reactive to light.  Neck: Normal range of motion. Neck supple. No JVD present.  Cardiovascular: Normal rate, regular rhythm, normal heart sounds and intact distal pulses.   No extremity edema.  Pulmonary/Chest: Effort normal and breath sounds  normal. No respiratory distress. She has no wheezes. She has no rales.  Abdominal: Soft. Bowel sounds are normal. There is no tenderness.  Musculoskeletal: Normal range of motion. She exhibits no edema.  Neurological: She is alert and oriented to person, place, and time. She has normal strength. No sensory deficit.  Speech fluent, goal oriented. Moves limbs without ataxia. Equal grip strength bilateral.  Skin: Skin is warm and dry. She is not diaphoretic.  Psychiatric: She has a normal mood and affect. Her behavior is normal.  Nursing note and vitals reviewed.   ED Course  Procedures (including critical care time) Labs Review Labs Reviewed  CBC - Abnormal; Notable for the following:    Platelets 484 (*)    All other components within normal limits  BASIC METABOLIC PANEL - Abnormal; Notable for the following:    Glucose, Bld 130 (*)    BUN 24 (*)    Creatinine, Ser 1.84 (*)    GFR calc non Af Amer 24 (*)    GFR calc Af Amer 28 (*)    All other components within normal limits  BRAIN NATRIURETIC PEPTIDE - Abnormal; Notable for the following:    B Natriuretic Peptide 682.5 (*)    All other components within normal limits  Randolm Idol, ED    Imaging Review Dg Chest Port 1 View  05/03/2014   CLINICAL DATA:  Shortness of breath with chest pressure/pain.  COPD.  EXAM: PORTABLE CHEST - 1 VIEW  COMPARISON:  04/22/2014  FINDINGS:  Patient slightly rotated to the right. Sternotomy wires unchanged. Lungs are adequately inflated with worsening mild right basilar opacification with blunting of the right costophrenic angle as findings may be due to atelectasis versus infection with small effusion. There is evidence of patient's large hiatal hernia containing a air which projects over the retrocardiac region and medial right base. Mild stable cardiomegaly. Mild calcified plaque over the thoracic aorta. Remainder of the exam is unchanged.  IMPRESSION: Worsening opacification in the right base likely atelectasis versus infection with small right pleural effusion.  Known large hiatal hernia containing air in the retrocardiac region and projecting to the medial right base.  Stable cardiomegaly.   Electronically Signed   By: Marin Olp M.D.   On: 05/03/2014 13:45     EKG Interpretation   Date/Time:  Thursday May 03 2014 11:56:46 EDT Ventricular Rate:  95 PR Interval:  168 QRS Duration: 135 QT Interval:  411 QTC Calculation: 517 R Axis:   -63 Text Interpretation:  Sinus rhythm Atrial premature complexes Left bundle  branch block No significant change since last tracing Confirmed by  Winfred Leeds  MD, SAM [phone]) on 05/03/2014 12:57:03 PM        MDM   Final diagnoses:  Palpitations  Atelectasis   Nontoxic appearing, NAD. AF VSS. Workup negative. EKG unchanged. CXR reviewed both by me and Dr. Winfred Leeds, appears most likely atelectasis. Doubt pneumonia, no fever, leukocytosis, or cough. Appears well. Able to ambulate without any difficulty, O2 sat remains around 90% on RA without pt feeling short of breath. I spoke with Dr. Marlou Porch with cardiology who agrees pt can be discharged home, has f/u with cardiology in 4 days. Stable for d/c. Return precautions given. Patient and daughter state understanding of treatment care plan and are agreeable.   Discussed with attending Dr. Winfred Leeds who also evaluated patient and agrees with  plan of care.   Carman Ching, PA-C 05/03/14 [place], MD 05/03/14 [phone]

## 2014-05-03 NOTE — ED Notes (Signed)
Pt c/o centralized chest pressure starting yesterday. Pain is intermittent, associated with shortness of breath . EMS gave nitro x 3 with relief. Pt reports recent hospitalization for fluid overload; reports 10lbs of fluid was removed.

## 2014-05-03 NOTE — ED Notes (Signed)
Robyn PA informed of hr and oxygen sats while ambulating

## 2014-05-05 ENCOUNTER — Emergency Department (HOSPITAL_COMMUNITY): Payer: Medicare Other

## 2014-05-05 ENCOUNTER — Encounter (HOSPITAL_COMMUNITY): Payer: Self-pay

## 2014-05-05 ENCOUNTER — Inpatient Hospital Stay (HOSPITAL_COMMUNITY)
Admission: EM | Admit: 2014-05-05 | Discharge: 2014-05-08 | DRG: 291 | Disposition: A | Discharge status: Home / Self Care | Payer: Medicare Other | Attending: Internal Medicine | Admitting: Internal Medicine

## 2014-05-05 DIAGNOSIS — I255 Ischemic cardiomyopathy: Secondary | ICD-10-CM | POA: Diagnosis present

## 2014-05-05 DIAGNOSIS — I080 Rheumatic disorders of both mitral and aortic valves: Secondary | ICD-10-CM | POA: Diagnosis present

## 2014-05-05 DIAGNOSIS — I10 Essential (primary) hypertension: Secondary | ICD-10-CM | POA: Diagnosis not present

## 2014-05-05 DIAGNOSIS — I5042 Chronic combined systolic (congestive) and diastolic (congestive) heart failure: Secondary | ICD-10-CM | POA: Diagnosis not present

## 2014-05-05 DIAGNOSIS — R7989 Other specified abnormal findings of blood chemistry: Secondary | ICD-10-CM

## 2014-05-05 DIAGNOSIS — R0602 Shortness of breath: Secondary | ICD-10-CM | POA: Diagnosis present

## 2014-05-05 DIAGNOSIS — I739 Peripheral vascular disease, unspecified: Secondary | ICD-10-CM | POA: Diagnosis present

## 2014-05-05 DIAGNOSIS — E785 Hyperlipidemia, unspecified: Secondary | ICD-10-CM | POA: Diagnosis not present

## 2014-05-05 DIAGNOSIS — I5043 Acute on chronic combined systolic (congestive) and diastolic (congestive) heart failure: Secondary | ICD-10-CM | POA: Diagnosis present

## 2014-05-05 DIAGNOSIS — Z79899 Other long term (current) drug therapy: Secondary | ICD-10-CM | POA: Diagnosis not present

## 2014-05-05 DIAGNOSIS — J449 Chronic obstructive pulmonary disease, unspecified: Secondary | ICD-10-CM | POA: Diagnosis present

## 2014-05-05 DIAGNOSIS — Z951 Presence of aortocoronary bypass graft: Secondary | ICD-10-CM | POA: Diagnosis not present

## 2014-05-05 DIAGNOSIS — E049 Nontoxic goiter, unspecified: Secondary | ICD-10-CM | POA: Diagnosis present

## 2014-05-05 DIAGNOSIS — E876 Hypokalemia: Secondary | ICD-10-CM | POA: Diagnosis present

## 2014-05-05 DIAGNOSIS — R0902 Hypoxemia: Secondary | ICD-10-CM

## 2014-05-05 DIAGNOSIS — Z955 Presence of coronary angioplasty implant and graft: Secondary | ICD-10-CM | POA: Diagnosis not present

## 2014-05-05 DIAGNOSIS — I5023 Acute on chronic systolic (congestive) heart failure: Secondary | ICD-10-CM | POA: Diagnosis not present

## 2014-05-05 DIAGNOSIS — N184 Chronic kidney disease, stage 4 (severe): Secondary | ICD-10-CM | POA: Diagnosis present

## 2014-05-05 DIAGNOSIS — Z87891 Personal history of nicotine dependence: Secondary | ICD-10-CM | POA: Diagnosis not present

## 2014-05-05 DIAGNOSIS — I340 Nonrheumatic mitral (valve) insufficiency: Secondary | ICD-10-CM | POA: Diagnosis present

## 2014-05-05 DIAGNOSIS — I2510 Atherosclerotic heart disease of native coronary artery without angina pectoris: Secondary | ICD-10-CM | POA: Diagnosis not present

## 2014-05-05 DIAGNOSIS — I509 Heart failure, unspecified: Secondary | ICD-10-CM

## 2014-05-05 DIAGNOSIS — Z7982 Long term (current) use of aspirin: Secondary | ICD-10-CM | POA: Diagnosis not present

## 2014-05-05 DIAGNOSIS — H269 Unspecified cataract: Secondary | ICD-10-CM | POA: Diagnosis present

## 2014-05-05 DIAGNOSIS — I248 Other forms of acute ischemic heart disease: Secondary | ICD-10-CM | POA: Diagnosis present

## 2014-05-05 DIAGNOSIS — I252 Old myocardial infarction: Secondary | ICD-10-CM | POA: Diagnosis not present

## 2014-05-05 DIAGNOSIS — I130 Hypertensive heart and chronic kidney disease with heart failure and stage 1 through stage 4 chronic kidney disease, or unspecified chronic kidney disease: Secondary | ICD-10-CM | POA: Diagnosis present

## 2014-05-05 DIAGNOSIS — Z8673 Personal history of transient ischemic attack (TIA), and cerebral infarction without residual deficits: Secondary | ICD-10-CM | POA: Diagnosis not present

## 2014-05-05 LAB — BASIC METABOLIC PANEL
Anion gap: 13 (ref 5–15)
BUN: 23 mg/dL (ref 6–23)
CO2: 22 mmol/L (ref 19–32)
CREATININE: 1.85 mg/dL — AB (ref 0.50–1.10)
Calcium: 9.1 mg/dL (ref 8.4–10.5)
Chloride: 97 mmol/L (ref 96–112)
GFR calc non Af Amer: 24 mL/min — ABNORMAL LOW (ref >90)
GFR, EST AFRICAN AMERICAN: 27 mL/min — AB (ref >90)
GLUCOSE: 117 mg/dL — AB (ref 70–99)
Potassium: 4.1 mmol/L (ref 3.5–5.1)
Sodium: 132 mmol/L — ABNORMAL LOW (ref 135–145)

## 2014-05-05 LAB — D-DIMER, QUANTITATIVE (NOT AT ARMC): D-Dimer, Quant: 0.87 ug/mL-FEU — ABNORMAL HIGH (ref 0.00–0.48)

## 2014-05-05 LAB — I-STAT TROPONIN, ED: TROPONIN I, POC: 0.03 ng/mL (ref 0.00–0.08)

## 2014-05-05 LAB — CBC
HCT: 41.2 % (ref 36.0–46.0)
Hemoglobin: 13.8 g/dL (ref 12.0–15.0)
MCH: 30.1 pg (ref 26.0–34.0)
MCHC: 33.5 g/dL (ref 30.0–36.0)
MCV: 90 fL (ref 78.0–100.0)
PLATELETS: 479 10*3/uL — AB (ref 150–400)
RBC: 4.58 MIL/uL (ref 3.87–5.11)
RDW: 15 % (ref 11.5–15.5)
WBC: 8.7 10*3/uL (ref 4.0–10.5)

## 2014-05-05 LAB — TROPONIN I: TROPONIN I: 0.04 ng/mL — AB (ref <0.031)

## 2014-05-05 LAB — BRAIN NATRIURETIC PEPTIDE: B Natriuretic Peptide: 949.1 pg/mL — ABNORMAL HIGH (ref 0.0–100.0)

## 2014-05-05 MED ORDER — IPRATROPIUM BROMIDE 0.02 % IN SOLN
0.5000 mg | Freq: Once | RESPIRATORY_TRACT | Status: AC
  Administered 2014-05-05: 0.5 mg via RESPIRATORY_TRACT
  Filled 2014-05-05: qty 2.5

## 2014-05-05 MED ORDER — FUROSEMIDE 10 MG/ML IJ SOLN
40.0000 mg | Freq: Two times a day (BID) | INTRAMUSCULAR | Status: DC
  Administered 2014-05-06 – 2014-05-08 (×5): 40 mg via INTRAVENOUS
  Filled 2014-05-05 (×7): qty 4

## 2014-05-05 MED ORDER — ONDANSETRON HCL 4 MG/2ML IJ SOLN: 4.0000 mg | Freq: Four times a day (QID) | INTRAMUSCULAR | Status: DC | PRN

## 2014-05-05 MED ORDER — MELATONIN 5 MG PO TABS
15.0000 mg | ORAL_TABLET | Freq: Every evening | ORAL | Status: DC | PRN
Start: 2014-05-05 — End: 2014-05-05

## 2014-05-05 MED ORDER — ASPIRIN EC 81 MG PO TBEC
81.0000 mg | DELAYED_RELEASE_TABLET | Freq: Every day | ORAL | Status: DC
  Administered 2014-05-06 – 2014-05-08 (×3): 81 mg via ORAL
  Filled 2014-05-05 (×3): qty 1

## 2014-05-05 MED ORDER — AMLODIPINE BESYLATE 10 MG PO TABS
10.0000 mg | ORAL_TABLET | Freq: Every day | ORAL | Status: DC
  Administered 2014-05-06 – 2014-05-08 (×3): 10 mg via ORAL
  Filled 2014-05-05 (×3): qty 1

## 2014-05-05 MED ORDER — PNEUMOCOCCAL VAC POLYVALENT 25 MCG/0.5ML IJ INJ
0.5000 mL | INJECTION | INTRAMUSCULAR | Status: DC
  Filled 2014-05-05: qty 0.5

## 2014-05-05 MED ORDER — SODIUM CHLORIDE 0.9 % IJ SOLN
3.0000 mL | Freq: Two times a day (BID) | INTRAMUSCULAR | Status: DC
  Administered 2014-05-05 – 2014-05-08 (×6): 3 mL via INTRAVENOUS

## 2014-05-05 MED ORDER — HEPARIN SODIUM (PORCINE) 5000 UNIT/ML IJ SOLN
5000.0000 [IU] | Freq: Three times a day (TID) | INTRAMUSCULAR | Status: DC
  Administered 2014-05-05 – 2014-05-08 (×8): 5000 [IU] via SUBCUTANEOUS
  Filled 2014-05-05 (×11): qty 1

## 2014-05-05 MED ORDER — ALBUTEROL SULFATE HFA 108 (90 BASE) MCG/ACT IN AERS: 2.0000 | INHALATION_SPRAY | RESPIRATORY_TRACT | Status: DC | PRN

## 2014-05-05 MED ORDER — ALBUTEROL SULFATE (2.5 MG/3ML) 0.083% IN NEBU
5.0000 mg | INHALATION_SOLUTION | Freq: Once | RESPIRATORY_TRACT | Status: AC
  Administered 2014-05-05: 5 mg via RESPIRATORY_TRACT
  Filled 2014-05-05: qty 6

## 2014-05-05 MED ORDER — ISOSORB DINITRATE-HYDRALAZINE 20-37.5 MG PO TABS
1.0000 | ORAL_TABLET | Freq: Two times a day (BID) | ORAL | Status: DC
  Administered 2014-05-05 – 2014-05-08 (×6): 1 via ORAL
  Filled 2014-05-05 (×7): qty 1

## 2014-05-05 MED ORDER — SODIUM CHLORIDE 0.9 % IJ SOLN: 3.0000 mL | INTRAMUSCULAR | Status: DC | PRN

## 2014-05-05 MED ORDER — SODIUM CHLORIDE 0.9 % IV SOLN: 250.0000 mL | INTRAVENOUS | Status: DC | PRN

## 2014-05-05 MED ORDER — ALBUTEROL SULFATE (2.5 MG/3ML) 0.083% IN NEBU: 3.0000 mL | INHALATION_SOLUTION | RESPIRATORY_TRACT | Status: DC | PRN

## 2014-05-05 MED ORDER — ACETAMINOPHEN 325 MG PO TABS: 650.0000 mg | ORAL_TABLET | ORAL | Status: DC | PRN

## 2014-05-05 MED ORDER — FUROSEMIDE 10 MG/ML IJ SOLN
40.0000 mg | Freq: Once | INTRAMUSCULAR | Status: AC
  Administered 2014-05-05: 40 mg via INTRAVENOUS
  Filled 2014-05-05: qty 4

## 2014-05-05 MED ORDER — POTASSIUM CHLORIDE CRYS ER 10 MEQ PO TBCR
10.0000 meq | EXTENDED_RELEASE_TABLET | Freq: Every day | ORAL | Status: DC
  Administered 2014-05-06 – 2014-05-07 (×2): 10 meq via ORAL
  Filled 2014-05-05 (×3): qty 1

## 2014-05-05 MED ORDER — METOPROLOL TARTRATE 12.5 MG HALF TABLET
12.5000 mg | ORAL_TABLET | Freq: Every day | ORAL | Status: DC
  Administered 2014-05-06 – 2014-05-08 (×3): 12.5 mg via ORAL
  Filled 2014-05-05 (×3): qty 1

## 2014-05-05 MED ORDER — PRAVASTATIN SODIUM 80 MG PO TABS
80.0000 mg | ORAL_TABLET | Freq: Every day | ORAL | Status: DC
Start: 2014-05-06 — End: 2014-05-08
  Administered 2014-05-06 – 2014-05-07 (×2): 80 mg via ORAL
  Filled 2014-05-05 (×3): qty 1

## 2014-05-05 NOTE — ED Notes (Signed)
Portable x-ray at the bedside.  

## 2014-05-05 NOTE — ED Notes (Signed)
Dr. Plunkett at bedside.  

## 2014-05-05 NOTE — ED Notes (Signed)
Spoke with Dr. Maryan Rued in regards to 2nd troponin ordered. 1st has already resulted. Duplicate order.

## 2014-05-05 NOTE — ED Notes (Signed)
Reported ddimer of 0.87 to dr. Johny Blamer

## 2014-05-05 NOTE — ED Notes (Signed)
Phlebotomy at the bedside for troponin draw.

## 2014-05-05 NOTE — ED Provider Notes (Signed)
CSN: [id number]     Arrival date & time 05/05/14  1830 History   First MD Initiated Contact with Patient 05/05/14 1833     Chief Complaint  Patient presents with  . Shortness of Breath     (Consider location/radiation/quality/duration/timing/severity/associated sxs/prior Treatment) Patient is a 81 y.o. female presenting with shortness of breath. The history is provided by the patient.  Shortness of Breath Severity:  Severe Onset quality:  Gradual Duration:  1 day Timing:  Intermittent Progression:  Worsening Chronicity:  Recurrent Context comment:  Patient states at 4 AM this morning she woke up with left-sided intense chest pain and pressure that resolved with nitroglycerin however she's continued to have shortness of breath that has progressed throughout the day Relieved by:  Oxygen Worsened by:  Exertion (Lying flat) Ineffective treatments:  Inhaler Associated symptoms: chest pain   Associated symptoms: no abdominal pain, no cough, no fever, no sputum production, no vomiting and no wheezing   Risk factors comment:  Coronary artery disease, ischemic cardiomyopathy, CHF, chronic kidney disease stage IV, aortic aneurysm, COPD   Past Medical History  Diagnosis Date  . CAD (coronary artery disease)     a. 07/2008 Inf STEMI->RCA BMS;  b. 09/2008 CABGx2: LIMA->LAD, RIMA->OM as Y from [facility];  b. 06/2011 MV EF 31%, bsaal to dist ant infarct, inf/inflat infarct with mild to mod peri-infarct ischemia [facility]).  . Ischemic cardiomyopathy     a. 06/2011 Echo: EF 40%, inf/inflat AK and lat HK, aortic sclerosis w/ mild AI, Mild to mod MR (prev mod-sev), Gr 1 DD, mild TR, RVSP 62mmHg  . Systolic CHF, chronic     a. 06/2011 EF 40%  . Hypertension   . Hyperlipidemia   . Hiatal hernia     large  . Tobacco abuse   . Cerebrovascular disease   . COPD (chronic obstructive pulmonary disease)     presumed   . Arterial disease     peripheral arterial disease with claudication - bilateral  SFA disease with ABI's 0.4-0.5 bilaterally   . Mitral regurgitation     a. prev Mod-Sev, read as mild to mod by echo 06/2011 [facility]).  . CKD (chronic kidney disease), stage IV     born with 1 kidney  . MI (myocardial infarction) 2010  . Thyroid nodule 10/2012    left dominant nodule cytology:non-neoplastic goiter.   . Cataracts, both eyes     not surgical  . Personal history of colonic polyps - adenomas 05/09/2013  . Anemia   . Systolic HF (heart failure)   . Hypertensive renal disease   . Hypertensive heart disease with congestive heart failure   . Aorta aneurysm   . LVH (left ventricular hypertrophy)   . Arteriosclerotic vascular disease   . Bronchiectasis   . Atherosclerosis of abdominal aorta   . Heart murmur   . Shortness of breath dyspnea    Past Surgical History  Procedure Laterality Date  . Laprotomy for bowel obstruction    . Coronary artery bypass graft  09/2008    aortic thoraic anuerysm resecton 2010.   Marland Kitchen Coronary stent placement  07/2008    BMS to RCA  . Flexible sigmoidoscopy N/A 05/09/2013    Procedure: FLEXIBLE SIGMOIDOSCOPY;  Surgeon: [name], MD;  Location: [place];  Service: Endoscopy;  Laterality: N/A;  with possible hemorrhoid banding use gastrosope unsdeated  . Appendectomy    . Oophorectomy      one removed in 70's  .  Hemorrhoid surgery    . Colonoscopy N/A 05/12/2013    Procedure: COLONOSCOPY;  Surgeon: [name], MD;  [facility];  Service: Endoscopy;  Laterality: N/A;   Family History  Problem Relation Age of Onset  . Coronary artery disease Mother     extensive family history; several other family members.  . Heart disease Mother     CHF  . Coronary artery disease Brother   . Leukemia Sister   . Leukemia Sister   . Leukemia Father   . Colon cancer Neg Hx   . Pancreatic cancer Neg Hx   . Stomach cancer Neg Hx   . Rectal cancer Neg Hx   . Kidney cancer Brother   . Heart failure Mother   . Heart attack  Sister    History  Substance Use Topics  . Smoking status: Former Smoker -- 1.00 packs/day for 60 years    Types: Cigarettes    Quit date: 07/12/2008  . Smokeless tobacco: Never Used  . Alcohol Use: No   OB History    No data available     Review of Systems  Constitutional: Negative for fever.  Respiratory: Positive for shortness of breath. Negative for cough, sputum production and wheezing.   Cardiovascular: Positive for chest pain.  Gastrointestinal: Negative for vomiting and abdominal pain.  All other systems reviewed and are negative.     Allergies  Review of patient's allergies indicates no known allergies.  Home Medications   Prior to Admission medications   Medication Sig Start Date End Date Taking? Authorizing Provider  albuterol (PROVENTIL HFA;VENTOLIN HFA) 108 (90 BASE) MCG/ACT inhaler Inhale 2 puffs into the lungs every 6 (six) hours as needed for wheezing or shortness of breath.    Historical Provider, MD  amLODipine (NORVASC) 10 MG tablet TAKE ONE TABLET BY MOUTH ONCE DAILY 04/26/14   Burnell Blanks, MD  aspirin EC 81 MG tablet Take 81 mg by mouth daily.    Historical Provider, MD  Coenzyme Q-10 100 MG capsule Take 100 mg by mouth daily.      Historical Provider, MD  furosemide (LASIX) 40 MG tablet Take 1 tablet (40 mg total) by mouth daily. 04/23/14   Brett Canales, PA-C  isosorbide-hydrALAZINE (BIDIL) 20-37.5 MG per tablet Take 1 tablet by mouth 2 (two) times daily. 04/22/14   Brett Canales, PA-C  Melatonin 5 MG TABS Take 15 mg by mouth at bedtime as needed (sleep).    Historical Provider, MD  metoprolol tartrate (LOPRESSOR) 25 MG tablet Take 0.5 tablets (12.5 mg total) by mouth daily. 04/22/14   Brett Canales, PA-C  nitroGLYCERIN (NITROSTAT) 0.4 MG SL tablet Place 1 tablet (0.4 mg total) under the tongue every 5 (five) minutes as needed. Patient taking differently: Place 0.4 mg under the tongue every 5 (five) minutes as needed for chest pain.  04/13/14    Burnell Blanks, MD  potassium chloride SA (K-DUR,KLOR-CON) 10 MEQ tablet Take 1 tablet (10 mEq total) by mouth daily. 04/22/14   Brett Canales, PA-C  pravastatin (PRAVACHOL) 80 MG tablet TAKE ONE TABLET BY MOUTH ONCE DAILY IN THE EVENING 07/21/13   Burnell Blanks, MD   BP 136/62 mmHg  Pulse 86  Temp(Src) 98.5 F (36.9 C) (Oral)  Resp 21  SpO2 91% Physical Exam  Constitutional: She is oriented to person, place, and time. She appears well-developed and well-nourished. No distress.  HENT:  Head: Normocephalic and atraumatic.  Mouth/Throat: Oropharynx is clear and  moist.  Eyes: Conjunctivae and EOM are normal. Pupils are equal, round, and reactive to light.  Neck: Normal range of motion. Neck supple.  Cardiovascular: Normal rate, regular rhythm and intact distal pulses.   No murmur heard. Pulmonary/Chest: Effort normal. Tachypnea noted. No respiratory distress. She has wheezes. She has rales in the right lower field and the left lower field.  Scant wheezes  Abdominal: Soft. She exhibits no distension. There is no tenderness. There is no rebound and no guarding.  Musculoskeletal: Normal range of motion. She exhibits no edema or tenderness.  No calf Pain or swelling  Neurological: She is alert and oriented to person, place, and time.  Skin: Skin is warm and dry. No rash noted. No erythema.  Psychiatric: She has a normal mood and affect. Her behavior is normal.  Nursing note and vitals reviewed.   ED Course  Procedures (including critical care time) Labs Review Labs Reviewed  BASIC METABOLIC PANEL - Abnormal; Notable for the following:    Sodium 132 (*)    Glucose, Bld 117 (*)    Creatinine, Ser 1.85 (*)    GFR calc non Af Amer 24 (*)    GFR calc Af Amer 27 (*)    All other components within normal limits  CBC - Abnormal; Notable for the following:    Platelets 479 (*)    All other components within normal limits  D-DIMER, QUANTITATIVE - Abnormal; Notable for the  following:    D-Dimer, Quant 0.87 (*)    All other components within normal limits  BRAIN NATRIURETIC PEPTIDE - Abnormal; Notable for the following:    B Natriuretic Peptide 949.1 (*)    All other components within normal limits  Randolm Idol, ED    Imaging Review Dg Chest Port 1 View  05/05/2014   CLINICAL DATA:  Short of breath  EXAM: PORTABLE CHEST - 1 VIEW  COMPARISON:  05/03/2014  FINDINGS: Cardiac enlargement with congestive heart failure. Vascular congestion and mild edema. Small bilateral effusions. These findings have progressed since the prior study. There is bibasilar atelectasis.  IMPRESSION: Progressive congestive heart failure with mild edema and bilateral effusions and bibasilar atelectasis.   Electronically Signed   By: Franchot Gallo M.D.   On: 05/05/2014 19:42     EKG Interpretation   Date/Time:  Saturday May 05 2014 18:31:08 EDT Ventricular Rate:  87 PR Interval:  176 QRS Duration: 130 QT Interval:  398 QTC Calculation: 478 R Axis:   -41 Text Interpretation:  Normal sinus rhythm Left axis deviation Left bundle  branch block Cannot rule out Anterior infarct , age undetermined T wave  abnormality, consider lateral ischemia No significant change since last  tracing Confirmed by Maryan Rued  MD, Loree Fee ([id number]) on 05/05/2014 6:42:13 PM      MDM   Final diagnoses:  SOB (shortness of breath)  Acute on chronic congestive heart failure, unspecified congestive heart failure type    Patient presenting today with one episode of chest pain this morning with worsening shortness of breath. Patient has a significant cardiac history with coronary artery disease status post CABG, CHF with recent hospitalization for fluid overload and recent ER visit 2 days ago for shortness of breath. Patient states she was so short of breath today she called the ambulance. When EMS arrived patient's oxygen saturation was 85% on room air and she does not wear oxygen at home. This improved  with oxygen was placed. She denies any infectious etiology. She's had no further chest pain  today. No prior history of clots. Concern today for ACS versus CHF versus PE versus COPD exacerbation.  D-dimer, CBC, BMP, BNP, troponin, EKG, chest x-ray pending  9:01 PM Labs and chest x-ray consistent with fluid overload again. Persistent renal insufficiency but really no different than most recent evaluation. D-dimer is slightly elevated however with signs of fluid overload on chest x-ray and elevated BNP feel that that is most likely the cause of her shortness of breath today and chest pain. We'll discuss with cardiology for further care. Patient also given IV Lasix.  Blanchie Dessert, MD 05/05/14 2217

## 2014-05-05 NOTE — H&P (Signed)
[facility] History and Physical  [name] YTK:[id number] DOB: May 15, 1927 DOA: 05/05/2014  Referring physician: EDP PCP: Leonard Downing, MD   Chief Complaint: SOB   HPI: [name] is a 81 y.o. female with h/o ischemic cardiomyopathy, systolic CHF, EF 55-73% on echo earlier this month.  Just admitted from 4/7 to 4/10 for CHF exacerbation.  She presents to the ED with 1 day history of SOB, worse with exertion or lying down flat, oxygen makes symptoms better.  There was an associated episode of chest pressure that woke her from sleep at 4am this morning that got better with NTG, but her SOB has persisted.  Review of Systems: Systems reviewed.  As above, otherwise negative  Past Medical History  Diagnosis Date  . CAD (coronary artery disease)     a. 07/2008 Inf STEMI->RCA BMS;  b. 09/2008 CABGx2: LIMA->LAD, RIMA->OM as Y from LIMA;  b. 06/2011 MV EF 31%, bsaal to dist ant infarct, inf/inflat infarct with mild to mod peri-infarct ischemia [facility]).  . Ischemic cardiomyopathy     a. 06/2011 Echo: EF 40%, inf/inflat AK and lat HK, aortic sclerosis w/ mild AI, Mild to mod MR (prev mod-sev), Gr 1 DD, mild TR, RVSP 88mmHg  . Systolic CHF, chronic     a. 06/2011 EF 40%  . Hypertension   . Hyperlipidemia   . Hiatal hernia     large  . Tobacco abuse   . Cerebrovascular disease   . COPD (chronic obstructive pulmonary disease)     presumed   . Arterial disease     peripheral arterial disease with claudication - bilateral SFA disease with ABI's 0.4-0.5 bilaterally   . Mitral regurgitation     a. prev Mod-Sev, read as mild to mod by echo 06/2011 [facility]).  . CKD (chronic kidney disease), stage IV     born with 1 kidney  . MI (myocardial infarction) 2010  . Thyroid nodule 10/2012    left dominant nodule cytology:non-neoplastic goiter.   . Cataracts, both eyes     not surgical  . Personal history of colonic polyps - adenomas 05/09/2013  . Anemia    . Systolic HF (heart failure)   . Hypertensive renal disease   . Hypertensive heart disease with congestive heart failure   . Aorta aneurysm   . LVH (left ventricular hypertrophy)   . Arteriosclerotic vascular disease   . Bronchiectasis   . Atherosclerosis of abdominal aorta   . Heart murmur   . Shortness of breath dyspnea    Past Surgical History  Procedure Laterality Date  . Laprotomy for bowel obstruction    . Coronary artery bypass graft  09/2008    aortic thoraic anuerysm resecton 2010.   Marland Kitchen Coronary stent placement  07/2008    BMS to RCA  . Flexible sigmoidoscopy N/A 05/09/2013    Procedure: FLEXIBLE SIGMOIDOSCOPY;  Surgeon: [name], MD;  Location: [place];  Service: Endoscopy;  Laterality: N/A;  with possible hemorrhoid banding use gastrosope unsdeated  . Appendectomy    . Oophorectomy      one removed in 70's  . Hemorrhoid surgery    . Colonoscopy N/A 05/12/2013    Procedure: COLONOSCOPY;  Surgeon: [name], MD;  Location: [facility];  Service: Endoscopy;  Laterality: N/A;   Social History:  reports that she quit smoking about 5 years ago. Her smoking use included Cigarettes. She has a 60 pack-year smoking history. She has never used smokeless tobacco.  She reports that she does not drink alcohol or use illicit drugs.  No Known Allergies  Family History  Problem Relation Age of Onset  . Coronary artery disease Mother     extensive family history; several other family members.  . Heart disease Mother     CHF  . Coronary artery disease Brother   . Leukemia Sister   . Leukemia Sister   . Leukemia Father   . Colon cancer Neg Hx   . Pancreatic cancer Neg Hx   . Stomach cancer Neg Hx   . Rectal cancer Neg Hx   . Kidney cancer Brother   . Heart failure Mother   . Heart attack Sister      Prior to Admission medications   Medication Sig Start Date End Date Taking? Authorizing Provider  albuterol (PROVENTIL HFA;VENTOLIN HFA) 108 (90 BASE) MCG/ACT  inhaler Inhale 2 puffs into the lungs every 4 (four) hours as needed for wheezing or shortness of breath.    Yes Historical Provider, MD  amLODipine (NORVASC) 10 MG tablet TAKE ONE TABLET BY MOUTH ONCE DAILY 04/26/14  Yes Burnell Blanks, MD  aspirin EC 81 MG tablet Take 81 mg by mouth daily.   Yes Historical Provider, MD  Coenzyme Q-10 100 MG capsule Take 100 mg by mouth daily.     Yes Historical Provider, MD  furosemide (LASIX) 40 MG tablet Take 1 tablet (40 mg total) by mouth daily. 04/23/14  Yes Einar Pheasant Hager, PA-C  isosorbide-hydrALAZINE (BIDIL) 20-37.5 MG per tablet Take 1 tablet by mouth 2 (two) times daily. 04/22/14  Yes Brett Canales, PA-C  metoprolol tartrate (LOPRESSOR) 25 MG tablet Take 0.5 tablets (12.5 mg total) by mouth daily. 04/22/14  Yes Brett Canales, PA-C  nitroGLYCERIN (NITROSTAT) 0.4 MG SL tablet Place 1 tablet (0.4 mg total) under the tongue every 5 (five) minutes as needed. Patient taking differently: Place 0.4 mg under the tongue every 5 (five) minutes as needed for chest pain.  04/13/14  Yes Burnell Blanks, MD  potassium chloride SA (K-DUR,KLOR-CON) 10 MEQ tablet Take 1 tablet (10 mEq total) by mouth daily. 04/22/14  Yes Brett Canales, PA-C  pravastatin (PRAVACHOL) 80 MG tablet TAKE ONE TABLET BY MOUTH ONCE DAILY IN THE EVENING Patient taking differently: TAKE ONE TABLET BY MOUTH ONCE DAILY IN THE afternoon 07/21/13  Yes Burnell Blanks, MD  Melatonin 5 MG TABS Take 15 mg by mouth at bedtime as needed (sleep).    Historical Provider, MD   Physical Exam: Filed Vitals:   05/05/14 2201  BP: 124/59  Pulse: 105  Temp:   Resp: 22    BP 124/59 mmHg  Pulse 105  Temp(Src) 98.5 F (36.9 C) (Oral)  Resp 22  SpO2 91%  General Appearance:    Alert, oriented, no distress, appears stated age  Head:    Normocephalic, atraumatic  Eyes:    PERRL, EOMI, sclera non-icteric        Nose:   Nares without drainage or epistaxis. Mucosa, turbinates normal  Throat:    Moist mucous membranes. Oropharynx without erythema or exudate.  Neck:   Supple. No carotid bruits.  No thyromegaly.  No lymphadenopathy.   Back:     No CVA tenderness, no spinal tenderness  Lungs:     Bibasilar crackles  Chest wall:    No tenderness to palpitation  Heart:    Regular rate and rhythm without murmurs, gallops, rubs  Abdomen:     Soft, non-tender,  nondistended, normal bowel sounds, no organomegaly  Genitalia:    deferred  Rectal:    deferred  Extremities:   No clubbing, cyanosis or edema.  Pulses:   2+ and symmetric all extremities  Skin:   Skin color, texture, turgor normal, no rashes or lesions  Lymph nodes:   Cervical, supraclavicular, and axillary nodes normal  Neurologic:   CNII-XII intact. Normal strength, sensation and reflexes      throughout    Labs on Admission:  Basic Metabolic Panel:  Recent Labs Lab 05/03/14 1200 05/05/14 1853  NA 139 132*  K 4.0 4.1  CL 103 97  CO2 23 22  GLUCOSE 130* 117*  BUN 24* 23  CREATININE 1.84* 1.85*  CALCIUM 9.0 9.1   Liver Function Tests: No results for input(s): AST, ALT, ALKPHOS, BILITOT, PROT, ALBUMIN in the last 168 hours. No results for input(s): LIPASE, AMYLASE in the last 168 hours. No results for input(s): AMMONIA in the last 168 hours. CBC:  Recent Labs Lab 05/03/14 1200 05/05/14 1853  WBC 9.0 8.7  HGB 13.6 13.8  HCT 41.4 41.2  MCV 90.8 90.0  PLT 484* 479*   Cardiac Enzymes: No results for input(s): CKTOTAL, CKMB, CKMBINDEX, TROPONINI in the last 168 hours.  BNP (last 3 results) No results for input(s): PROBNP in the last 8760 hours. CBG: No results for input(s): GLUCAP in the last 168 hours.  Radiological Exams on Admission: Dg Chest Port 1 View  05/05/2014   CLINICAL DATA:  Short of breath  EXAM: PORTABLE CHEST - 1 VIEW  COMPARISON:  05/03/2014  FINDINGS: Cardiac enlargement with congestive heart failure. Vascular congestion and mild edema. Small bilateral effusions. These findings have  progressed since the prior study. There is bibasilar atelectasis.  IMPRESSION: Progressive congestive heart failure with mild edema and bilateral effusions and bibasilar atelectasis.   Electronically Signed   By: Franchot Gallo M.D.   On: 05/05/2014 19:42    EKG: Independently reviewed.  Assessment/Plan Active Problems:   Essential hypertension   Chronic combined systolic and diastolic heart failure, NYHA class 2   CKD (chronic kidney disease), stage IV   Acute on chronic combined systolic and diastolic CHF (congestive heart failure)   CHF (congestive heart failure)   1. Acute on chronic combined systolic and diastolic CHF - see HPI above 1. CHF pathway 2. Lasix 40mg  IV BID ordered 3. Strict intake and output 4. Daily BMP 5. Replace K PRN and 61meq daily home med continued 6. Continue nitrates, beta blocker 2. CKD stage 4 - 1. checking renal ultrasound to r/o RAS 2. No ACEi / ARB 3. HTN - continue home meds    Code Status: Full Code  Family Communication: No family in room Disposition Plan: Admit to inpatient   Time spent: 70 min  Asuzena Weis M. [facility] Pager [phone]  If 7AM-7PM, please contact the day team taking care of the patient [email or web address] Password [facility] 05/05/2014, 10:34 PM

## 2014-05-05 NOTE — ED Notes (Addendum)
PER EMS: pt from home, reports she woke up this morning around 0400 with left sided, non radiating chest pain and shortness of breath. She states she took 4 nitro and her chest pain went away around 0500 but her SOB remained. Upon EMS arrival pts room air O2 was 85% and she placed on 5L [place] and O2 increased to 94%. No cough or fever. She was seen for palpitations and discharged from here on 4/21. VS: BP-126/68, HR-89. A&OX4, RR normal and no labored breathing noted at this time, lungs sounds clear.

## 2014-05-06 ENCOUNTER — Inpatient Hospital Stay (HOSPITAL_COMMUNITY): Payer: Medicare Other

## 2014-05-06 DIAGNOSIS — E785 Hyperlipidemia, unspecified: Secondary | ICD-10-CM

## 2014-05-06 DIAGNOSIS — I2510 Atherosclerotic heart disease of native coronary artery without angina pectoris: Secondary | ICD-10-CM

## 2014-05-06 DIAGNOSIS — I5023 Acute on chronic systolic (congestive) heart failure: Secondary | ICD-10-CM

## 2014-05-06 DIAGNOSIS — R7989 Other specified abnormal findings of blood chemistry: Secondary | ICD-10-CM

## 2014-05-06 LAB — BASIC METABOLIC PANEL
ANION GAP: 11 (ref 5–15)
BUN: 20 mg/dL (ref 6–23)
CO2: 28 mmol/L (ref 19–32)
CREATININE: 1.75 mg/dL — AB (ref 0.50–1.10)
Calcium: 8.8 mg/dL (ref 8.4–10.5)
Chloride: 97 mmol/L (ref 96–112)
GFR calc Af Amer: 29 mL/min — ABNORMAL LOW (ref >90)
GFR, EST NON AFRICAN AMERICAN: 25 mL/min — AB (ref >90)
GLUCOSE: 92 mg/dL (ref 70–99)
POTASSIUM: 3.7 mmol/L (ref 3.5–5.1)
Sodium: 136 mmol/L (ref 135–145)

## 2014-05-06 LAB — TROPONIN I
Troponin I: 0.04 ng/mL — ABNORMAL HIGH (ref <0.031)
Troponin I: 0.04 ng/mL — ABNORMAL HIGH (ref <0.031)

## 2014-05-06 MED ORDER — TECHNETIUM TO 99M ALBUMIN AGGREGATED
6.0000 | Freq: Once | INTRAVENOUS | Status: AC | PRN
  Administered 2014-05-06: 6 via INTRAVENOUS

## 2014-05-06 MED ORDER — TECHNETIUM TC 99M DIETHYLENETRIAME-PENTAACETIC ACID: 40.0000 | Freq: Once | INTRAVENOUS | Status: AC | PRN

## 2014-05-06 NOTE — Consult Note (Addendum)
Admit date: 05/05/2014 Referring Physician  Dr. Alcario Drought Primary Physician  Dr. Arelia Sneddon Primary Cardiologist  Dr.  Angelena Form Reason for Consultation  SOB/CHF  HPI: This is an 81 y/o female, [place], still drives during the day, who is followed by Dr Angelena Form.  She has history of CAD, ischemic cardiomyopathy, chronic systolic CHF, HTN, HLD, tobacco abuse, COPD, CKD, PAD, mitral regurgitation who presented to the ER with an epidose of sever CP that awakened her out of sleep and then developed SOB.  She had an inferior MI in July 2010 treated with a bare metal stent in the RCA, followed by CABG September 2010 with LIMA to LAD and free RIMA Y graft to OM off of LIMA. She also had aortic root replacement at that time secondary to dilated aortic root. She is known to have severe PAD. Last carotid artery dopplers August 2015 with moderate 60-79% bilateral stenosis. She is followed in Nephrology by Dr. Marval Regal and her renal function is stable. Last echo 04/20/2014  with LVEF 30-35%, severe MR and mild to moderate AS. Her EF by Myoview June 2013 was 31%. Her Myoview was abnormal but she was doing well and it was decided not to cath her then. She was recently in [facility] [facility] with an acute exacerbation of her CHF.  She was diuresed 4L and started on Bidil.  She apparently at that time was drinking a lot of water.  She was discharged home on Lasix 40mg  daily.  She has been following a low sodium diet and weighing herself daily.  She says her weight has not changed (weight at d/c was 134 lbs and today is 139 lbs.  In ER chest xray showed progressive CHF with mild edema and BNP was elevated at 949 (slightly up from discharge of 682).  Creatinine is stable.  Cardiology is now asked to consult.  She says that the chest pain awakened her from sleep at 4am but cannot really describe what it felt like other than it was severe.  It did resolve with SL NTG.  She then developed SOB throughout the day and decided to  come to the ER.       PMH:   Past Medical History  Diagnosis Date  . CAD (coronary artery disease)     a. 07/2008 Inf STEMI->RCA BMS;  b. 09/2008 CABGx2: LIMA->LAD, RIMA->OM as Y from LIMA;  b. 06/2011 MV EF 31%, bsaal to dist ant infarct, inf/inflat infarct with mild to mod peri-infarct ischemia [facility]).  . Ischemic cardiomyopathy     a. 06/2011 Echo: EF 40%, inf/inflat AK and lat HK, aortic sclerosis w/ mild AI, Mild to mod MR (prev mod-sev), Gr 1 DD, mild TR, RVSP 18mmHg  . Systolic CHF, chronic     a. 06/2011 EF 40%  . Hypertension   . Hyperlipidemia   . Hiatal hernia     large  . Tobacco abuse   . Cerebrovascular disease   . COPD (chronic obstructive pulmonary disease)     presumed   . Arterial disease     peripheral arterial disease with claudication - bilateral SFA disease with ABI's 0.4-0.5 bilaterally   . Mitral regurgitation     a. prev Mod-Sev, read as mild to mod by echo 06/2011 [facility]).  . CKD (chronic kidney disease), stage IV     born with 1 kidney  . MI (myocardial infarction) 2010  . Thyroid nodule 10/2012    left dominant nodule  cytology:non-neoplastic goiter.   . Cataracts, both eyes     not surgical  . Personal history of colonic polyps - adenomas 05/09/2013  . Anemia   . Systolic HF (heart failure)   . Hypertensive renal disease   . Hypertensive heart disease with congestive heart failure   . Aorta aneurysm   . LVH (left ventricular hypertrophy)   . Arteriosclerotic vascular disease   . Bronchiectasis   . Atherosclerosis of abdominal aorta   . Heart murmur   . Shortness of breath dyspnea      PSH:   Past Surgical History  Procedure Laterality Date  . Laprotomy for bowel obstruction    . Coronary artery bypass graft  09/2008    aortic thoraic anuerysm resecton 2010.   Marland Kitchen Coronary stent placement  07/2008    BMS to RCA  . Flexible sigmoidoscopy N/A 05/09/2013    Procedure: FLEXIBLE SIGMOIDOSCOPY;  Surgeon: [name], MD;   Location: [place];  Service: Endoscopy;  Laterality: N/A;  with possible hemorrhoid banding use gastrosope unsdeated  . Appendectomy    . Oophorectomy      one removed in 70's  . Hemorrhoid surgery    . Colonoscopy N/A 05/12/2013    Procedure: COLONOSCOPY;  Surgeon: [name], MD;  Location: [facility];  Service: Endoscopy;  Laterality: N/A;    Allergies:  Review of patient's allergies indicates no known allergies. Prior to Admit Meds:   Prescriptions prior to admission  Medication Sig Dispense Refill Last Dose  . albuterol (PROVENTIL HFA;VENTOLIN HFA) 108 (90 BASE) MCG/ACT inhaler Inhale 2 puffs into the lungs every 4 (four) hours as needed for wheezing or shortness of breath.    05/05/2014 at Unknown time  . amLODipine (NORVASC) 10 MG tablet TAKE ONE TABLET BY MOUTH ONCE DAILY 30 tablet 5 05/05/2014 at Unknown time  . aspirin EC 81 MG tablet Take 81 mg by mouth daily.   05/05/2014 at Unknown time  . Coenzyme Q-10 100 MG capsule Take 100 mg by mouth daily.     05/05/2014 at Unknown time  . furosemide (LASIX) 40 MG tablet Take 1 tablet (40 mg total) by mouth daily. 30 tablet 6 05/05/2014 at Unknown time  . isosorbide-hydrALAZINE (BIDIL) 20-37.5 MG per tablet Take 1 tablet by mouth 2 (two) times daily. 30 tablet 6 05/05/2014 at Unknown time  . metoprolol tartrate (LOPRESSOR) 25 MG tablet Take 0.5 tablets (12.5 mg total) by mouth daily. 60 tablet 5 05/05/2014 at 1100  . nitroGLYCERIN (NITROSTAT) 0.4 MG SL tablet Place 1 tablet (0.4 mg total) under the tongue every 5 (five) minutes as needed. (Patient taking differently: Place 0.4 mg under the tongue every 5 (five) minutes as needed for chest pain. ) 25 tablet 6 05/05/2014 at Unknown time  . potassium chloride SA (K-DUR,KLOR-CON) 10 MEQ tablet Take 1 tablet (10 mEq total) by mouth daily. 30 tablet 6 05/05/2014 at Unknown time  . pravastatin (PRAVACHOL) 80 MG tablet TAKE ONE TABLET BY MOUTH ONCE DAILY IN THE EVENING (Patient taking differently:  TAKE ONE TABLET BY MOUTH ONCE DAILY IN THE afternoon) 30 tablet 11 05/05/2014 at Unknown time  . Melatonin 5 MG TABS Take 15 mg by mouth at bedtime as needed (sleep).   05/03/2014   Fam HX:    Family History  Problem Relation Age of Onset  . Coronary artery disease Mother     extensive family history; several other family members.  . Heart disease Mother     CHF  .  Coronary artery disease Brother   . Leukemia Sister   . Leukemia Sister   . Leukemia Father   . Colon cancer Neg Hx   . Pancreatic cancer Neg Hx   . Stomach cancer Neg Hx   . Rectal cancer Neg Hx   . Kidney cancer Brother   . Heart failure Mother   . Heart attack Sister    Social HX:    History   Social History  . Marital Status: Widowed    Spouse Name: N/A  . Number of Children: Y  . Years of Education: N/A   Occupational History  . 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    Social History Main Topics  . Smoking status: Former Smoker -- 1.00 packs/day for 60 years    Types: Cigarettes    Quit date: 07/12/2008  . Smokeless tobacco: Never Used  . Alcohol Use: No  . Drug Use: No  . Sexual Activity: Not on file   Other Topics Concern  . Not on file   Social History Narrative   She lives in [place]. She is retired and widowed.      ROS:  All 11 ROS were addressed and are negative except what is stated in the HPI  Physical Exam: Blood pressure 130/57, pulse 98, temperature 98.4 F (36.9 C), temperature source Oral, resp. rate 18, height 5\' 6"  (1.676 m), weight 139 lb (63.05 kg), SpO2 92 %.    General: Well developed, well nourished, in no acute distress Head: Eyes PERRLA, No xanthomas.   Normal cephalic and atramatic  Lungs:   Crackles at bases bilaterally Heart:   HRRR S1 S2 Pulses are 2+ & equal. 2/6 SM at LLSB to apex            No carotid bruit. No JVD.  No abdominal bruits. No femoral bruits. Abdomen: Bowel sounds are positive, abdomen soft and non-tender without masses or                   Hernia's noted. Msk:  Back normal, normal gait. Normal strength and tone for age. Extremities:   No clubbing, cyanosis or edema.  DP +1 Neuro: Alert and oriented X 3. Psych:  Good affect, responds appropriately    Labs:   Lab Results  Component Value Date   WBC 8.7 05/05/2014   HGB 13.8 05/05/2014   HCT 41.2 05/05/2014   MCV 90.0 05/05/2014   PLT 479* 05/05/2014    Recent Labs Lab 05/05/14 1853  NA 132*  K 4.1  CL 97  CO2 22  BUN 23  CREATININE 1.85*  CALCIUM 9.1  GLUCOSE 117*   No results found for: PTT Lab Results  Component Value Date   INR 0.98 04/19/2014   INR 1.00 09/12/2013   INR 0.95 07/29/2010   Lab Results  Component Value Date   CKTOTAL 86 11/16/2009   CKMB 2.0 11/16/2009   TROPONINI 0.04* 05/05/2014     Lab Results  Component Value Date   CHOL 182 04/25/2014   CHOL 205* 12/26/2012   CHOL 235* 10/10/2012   Lab Results  Component Value Date   HDL 75.50 04/25/2014   HDL 45.10 12/26/2012   HDL 55.60 10/10/2012   Lab Results  Component Value Date   LDLCALC 88 04/25/2014   LDLCALC 80 02/02/2012   LDLCALC 79 12/18/2010   Lab Results  Component Value Date   TRIG 92.0 04/25/2014   TRIG 158.0* 12/26/2012   TRIG 154.0* 10/10/2012  Lab Results  Component Value Date   CHOLHDL 2 04/25/2014   CHOLHDL 5 12/26/2012   CHOLHDL 4 10/10/2012   Lab Results  Component Value Date   LDLDIRECT 136.2 12/26/2012   LDLDIRECT 152.4 10/10/2012      Radiology:  Dg Chest Port 1 View  05/05/2014   CLINICAL DATA:  Short of breath  EXAM: PORTABLE CHEST - 1 VIEW  COMPARISON:  05/03/2014  FINDINGS: Cardiac enlargement with congestive heart failure. Vascular congestion and mild edema. Small bilateral effusions. These findings have progressed since the prior study. There is bibasilar atelectasis.  IMPRESSION: Progressive congestive heart failure with mild edema and bilateral effusions and bibasilar atelectasis.   Electronically Signed   By: Franchot Gallo  M.D.   On: 05/05/2014 19:42    EKG:  NSR with LBBB  ASSESSMENT/PLAN: 1.  Acute on chronic systolic CHF - unclear what tipped her over. ? Dietary noncompliance although she denies this.  She also had severe MR by recent echo.  Her weight is up 5 lbs from discharge.  She has crackles at the bases.  Agree with IV Lasix and follow renal function closely.  Follow I&O's and daily weights.   2.  Moderate to severely reduced LVF secondary to ischemic DCM EF 30-35% by echo earlier this month.  Continue BB/Bidil 3.  ASCAD s/p remote CABG and aortic root replacement for aortic dilatation.  Continue ASA/long acting nitrates 4.  HTN - controlled on BB/amlodipine 5  Dyslipidemia - continue statin 6.  CKD stage IV 7.  Severe MR and mild to moderate AS by recent echo 8.  Mildly elevated troponin most likely secondary to demand ischemia 9.  Elevated D-Dimer- she did have an episode of CP this am but it resolved with NTG and was most likely related to volume overload but need to consider PE.  Recommend VQ scan to rule out PE - per [facility]  Sueanne Margarita, MD  05/06/2014  12:55 AM

## 2014-05-06 NOTE — Progress Notes (Signed)
Patient Name: [name] Date of Encounter: 05/06/2014     Active Problems:   Essential hypertension   Chronic combined systolic and diastolic heart failure, NYHA class 2   CKD (chronic kidney disease), stage IV   Acute on chronic combined systolic and diastolic CHF (congestive heart failure)   CHF (congestive heart failure)    SUBJECTIVE  The patient has improved overnight.  She has diuresed 2 pounds.  She is in no distress today.  Rhythm remains normal sinus rhythm.  A VQ scan has been ordered and is pending.  CURRENT MEDS . amLODipine  10 mg Oral Daily  . aspirin EC  81 mg Oral Daily  . furosemide  40 mg Intravenous BID  . heparin  5,000 Units Subcutaneous 3 times per day  . isosorbide-hydrALAZINE  1 tablet Oral BID  . metoprolol tartrate  12.5 mg Oral Daily  . pneumococcal 23 valent vaccine  0.5 mL Intramuscular Tomorrow-1000  . potassium chloride SA  10 mEq Oral Daily  . pravastatin  80 mg Oral q1800  . sodium chloride  3 mL Intravenous Q12H    OBJECTIVE  Filed Vitals:   05/05/14 2247 05/05/14 2321 05/06/14 0455 05/06/14 0930  BP: 127/55 130/57 119/51 110/55  Pulse: 91 98 82 96  Temp: 98.6 F (37 C) 98.4 F (36.9 C) 98.2 F (36.8 C)   TempSrc: Oral Oral Oral   Resp: 16 18 18    Height:  5\' 6"  (1.676 m)    Weight:  139 lb (63.05 kg) 137 lb 4.8 oz (62.279 kg)   SpO2: 93% 92% 94%     Intake/Output Summary (Last 24 hours) at 05/06/14 1155 Last data filed at 05/06/14 1121  Gross per 24 hour  Intake    420 ml  Output   1900 ml  Net  -1480 ml   Filed Weights   05/05/14 2321 05/06/14 0455  Weight: 139 lb (63.05 kg) 137 lb 4.8 oz (62.279 kg)    PHYSICAL EXAM  General: Pleasant, NAD. Neuro: Alert and oriented X 3. Moves all extremities spontaneously. Psych: Normal affect. HEENT:  Normal  Neck: Supple without bruits or JVD. Lungs:  Resp regular and unlabored, minimal basilar rales. Heart: RRR no s3, s4.  There is a grade 2/6 holosystolic apical  murmur of mitral regurgitation. Abdomen: Soft, non-tender, non-distended, BS + x 4.  Extremities: No clubbing, cyanosis or edema. DP/PT/Radials 1+ and equal bilaterally.  Accessory Clinical Findings  CBC  Recent Labs  05/03/14 1200 05/05/14 1853  WBC 9.0 8.7  HGB 13.6 13.8  HCT 41.4 41.2  MCV 90.8 90.0  PLT 484* 542*   Basic Metabolic Panel  Recent Labs  05/05/14 1853 05/06/14 0650  NA 132* 136  K 4.1 3.7  CL 97 97  CO2 22 28  GLUCOSE 117* 92  BUN 23 20  CREATININE 1.85* 1.75*  CALCIUM 9.1 8.8   Liver Function Tests No results for input(s): AST, ALT, ALKPHOS, BILITOT, PROT, ALBUMIN in the last 72 hours. No results for input(s): LIPASE, AMYLASE in the last 72 hours. Cardiac Enzymes  Recent Labs  05/05/14 2248 05/06/14 0650  TROPONINI 0.04* 0.04*   BNP Invalid input(s): POCBNP D-Dimer  Recent Labs  05/05/14 1935  DDIMER 0.87*   Hemoglobin A1C No results for input(s): HGBA1C in the last 72 hours. Fasting Lipid Panel No results for input(s): CHOL, HDL, LDLCALC, TRIG, CHOLHDL, LDLDIRECT in the last 72 hours. Thyroid Function Tests No results for input(s): TSH, T4TOTAL, T3FREE, THYROIDAB  in the last 72 hours.  Invalid input(s): FREET3  TELE  Normal sinus rhythm    Radiology/Studies  Dg Chest 2 View  04/22/2014   CLINICAL DATA:  Shortness of breath  EXAM: CHEST  2 VIEW  COMPARISON:  04/19/2014  FINDINGS: Cardiomegaly and CABG changes again noted.  The cardiomediastinal silhouette is unchanged.  Bibasilar atelectasis and probable small effusions again noted.  A large hiatal hernia is again identified.  There is no evidence of pneumothorax.  IMPRESSION: Resolved pulmonary edema. Continued mild bibasilar atelectasis and small bilateral pleural effusions.  Cardiomegaly and large hiatal hernia again noted.   Electronically Signed   By: Margarette Canada M.D.   On: 04/22/2014 09:33   Dg Chest Port 1 View  05/05/2014   CLINICAL DATA:  Short of breath  EXAM:  PORTABLE CHEST - 1 VIEW  COMPARISON:  05/03/2014  FINDINGS: Cardiac enlargement with congestive heart failure. Vascular congestion and mild edema. Small bilateral effusions. These findings have progressed since the prior study. There is bibasilar atelectasis.  IMPRESSION: Progressive congestive heart failure with mild edema and bilateral effusions and bibasilar atelectasis.   Electronically Signed   By: Franchot Gallo M.D.   On: 05/05/2014 19:42   Dg Chest Port 1 View  05/03/2014   CLINICAL DATA:  Shortness of breath with chest pressure/pain.  COPD.  EXAM: PORTABLE CHEST - 1 VIEW  COMPARISON:  04/22/2014  FINDINGS: Patient slightly rotated to the right. Sternotomy wires unchanged. Lungs are adequately inflated with worsening mild right basilar opacification with blunting of the right costophrenic angle as findings may be due to atelectasis versus infection with small effusion. There is evidence of patient's large hiatal hernia containing a air which projects over the retrocardiac region and medial right base. Mild stable cardiomegaly. Mild calcified plaque over the thoracic aorta. Remainder of the exam is unchanged.  IMPRESSION: Worsening opacification in the right base likely atelectasis versus infection with small right pleural effusion.  Known large hiatal hernia containing air in the retrocardiac region and projecting to the medial right base.  Stable cardiomegaly.   Electronically Signed   By: Marin Olp M.D.   On: 05/03/2014 13:45   Dg Chest Port 1 View  04/19/2014   CLINICAL DATA:  Shortness of breath, fever, and cough.  EXAM: PORTABLE CHEST - 1 VIEW  COMPARISON:  05/07/2013  FINDINGS: Postoperative changes in the mediastinum. Cardiac enlargement similar prior study. There is interval development of pulmonary vascular congestion with perihilar airspace disease and interstitial disease consistent with edema. Small bilateral pleural effusions are developing. No pneumothorax. Calcification of the aorta.   IMPRESSION: Cardiac enlargement with developing vascular congestion, edema, and small effusions.   Electronically Signed   By: Lucienne Capers M.D.   On: 04/19/2014 04:36    ASSESSMENT AND PLAN 1. Acute on chronic systolic CHF - unclear what tipped her over. ? Dietary noncompliance although she denies this. She also had severe MR by recent echo. Her weight is up 5 lbs from discharge. She has crackles at the bases. Agree with IV Lasix and follow renal function closely. Follow I&O's and daily weights.She states that she has had 3 hospital visits over the past month for exacerbation of CHF.  2. Moderate to severely reduced LVF secondary to ischemic DCM EF 30-35% by echo earlier this month. Continue BB/Bidil 3. ASCAD s/p remote CABG and aortic root replacement for aortic dilatation. Continue ASA/long acting nitrates 4. HTN - controlled on BB/amlodipine 5 Dyslipidemia - continue statin 6. CKD  stage IV.  Renal function stable with IV diuresis so far. 7. Severe MR and mild to moderate AS by recent echo 8. Mildly elevated troponin most likely secondary to demand ischemia 9. Elevated D-Dimer- she did have an episode of CP prior to admission but it resolved with NTG and was most likely related to volume overload but need to consider PE. Recommend VQ scan to rule out PE - per [facility]  Plan: Continue IV Lasix today and consider switch to oral Lasix tomorrow. Signed, Darlin Coco MD

## 2014-05-06 NOTE — Progress Notes (Addendum)
[facility] PROGRESS NOTE  [name] NGE:[id number] DOB: November 05, 1927 DOA: 05/05/2014 PCP: Leonard Downing, MD  Assessment/Plan: 1. Acute on chronic systolic CHF -EF of 40% -reports compliance with medications and diuretics -Continue IV Lasix, follow  I/Os and weights  -Cardiology following  -Hold ACE due to CKD  2. CAD , status post remote CABG, aortic root root replacement  -Continue aspirin, Imdur   3. CKD 4 - stable, monitor with diuresis -A renal arterial duplex ordered by admitter  4. Hypertension  -continue amlodipine and beta blockers  5. Elevated d-dimer -Nonspecific, clinical presentation consistent with CHF, will check VQ scan for completeness per Cards  recommendation, clinically very low suspicion for PE  DVt proph: hep SQ  Code Status: Full Code Family Communication: none at bedside (indicate person spoken with, relationship, and if by phone, the number) Disposition Plan: Home when improved   Consultants:  Cards   HPI/Subjective: breathing starting to improve  Objective: Filed Vitals:   05/06/14 0930  BP: 110/55  Pulse: 96  Temp:   Resp:     Intake/Output Summary (Last 24 hours) at 05/06/14 1330 Last data filed at 05/06/14 1121  Gross per 24 hour  Intake    420 ml  Output   1900 ml  Net  -1480 ml   Filed Weights   05/05/14 2321 05/06/14 0455  Weight: 63.05 kg (139 lb) 62.279 kg (137 lb 4.8 oz)    Exam:   General: AAOx3  Cardiovascular: S1S2/RRR  Respiratory: basilar crackles  Abdomen: soft, Nt, SB present  Musculoskeletal: no edema c/c  Data Reviewed: Basic Metabolic Panel:  Recent Labs Lab 05/03/14 1200 05/05/14 1853 05/06/14 0650  NA 139 132* 136  K 4.0 4.1 3.7  CL 103 97 97  CO2 23 22 28   GLUCOSE 130* 117* 92  BUN 24* 23 20  CREATININE 1.84* 1.85* 1.75*  CALCIUM 9.0 9.1 8.8   Liver Function Tests: No results for input(s): AST, ALT, ALKPHOS, BILITOT, PROT, ALBUMIN in the last 168 hours. No  results for input(s): LIPASE, AMYLASE in the last 168 hours. No results for input(s): AMMONIA in the last 168 hours. CBC:  Recent Labs Lab 05/03/14 1200 05/05/14 1853  WBC 9.0 8.7  HGB 13.6 13.8  HCT 41.4 41.2  MCV 90.8 90.0  PLT 484* 479*   Cardiac Enzymes:  Recent Labs Lab 05/05/14 2248 05/06/14 0650 05/06/14 1030  TROPONINI 0.04* 0.04* 0.04*   BNP (last 3 results)  Recent Labs  04/19/14 0420 05/03/14 1200 05/05/14 1935  BNP 885.3* 682.5* 949.1*    ProBNP (last 3 results) No results for input(s): PROBNP in the last 8760 hours.  CBG: No results for input(s): GLUCAP in the last 168 hours.  No results found for this or any previous visit (from the past 240 hour(s)).   Studies: Nm Pulmonary Perf And Vent  05/06/2014   CLINICAL DATA:  Dyspnea/short of breath. Difficulty breathing. Pulmonary embolism. Positive D-dimer. Hypoxia.  EXAM: NUCLEAR MEDICINE VENTILATION - PERFUSION LUNG SCAN  TECHNIQUE: Ventilation images were obtained in multiple projections using inhaled aerosol technetium 99 M DTPA. Perfusion images were obtained in multiple projections after intravenous injection of Tc-98m MAA.  RADIOPHARMACEUTICALS:  40.0 mCi Tc-6m DTPA aerosol and 6.0 mCi Tc-76m MAA  COMPARISON:  Chest radiograph 05/05/2014.  FINDINGS: Ventilation: Heterogeneous ventilation study with clumping of radiotracer and a mottled appearance of the lungs. Some of this is probably due to CHF with airspace disease identified on yesterday's chest radiograph.  Perfusion: No wedge  shaped peripheral perfusion defects to suggest acute pulmonary embolism.  IMPRESSION: Low probability of pulmonary embolus.   Electronically Signed   By: Dereck Ligas M.D.   On: 05/06/2014 13:12   Dg Chest Port 1 View  05/05/2014   CLINICAL DATA:  Short of breath  EXAM: PORTABLE CHEST - 1 VIEW  COMPARISON:  05/03/2014  FINDINGS: Cardiac enlargement with congestive heart failure. Vascular congestion and mild edema. Small  bilateral effusions. These findings have progressed since the prior study. There is bibasilar atelectasis.  IMPRESSION: Progressive congestive heart failure with mild edema and bilateral effusions and bibasilar atelectasis.   Electronically Signed   By: Franchot Gallo M.D.   On: 05/05/2014 19:42    Scheduled Meds: . amLODipine  10 mg Oral Daily  . aspirin EC  81 mg Oral Daily  . furosemide  40 mg Intravenous BID  . heparin  5,000 Units Subcutaneous 3 times per day  . isosorbide-hydrALAZINE  1 tablet Oral BID  . metoprolol tartrate  12.5 mg Oral Daily  . pneumococcal 23 valent vaccine  0.5 mL Intramuscular Tomorrow-1000  . potassium chloride SA  10 mEq Oral Daily  . pravastatin  80 mg Oral q1800  . sodium chloride  3 mL Intravenous Q12H   Continuous Infusions:  Antibiotics Given (last 72 hours)    None      Active Problems:   Essential hypertension   Chronic combined systolic and diastolic heart failure, NYHA class 2   CKD (chronic kidney disease), stage IV   Acute on chronic combined systolic and diastolic CHF (congestive heart failure)   CHF (congestive heart failure)    Time spent: 45min    [facility] Pager [phone]. If 7PM-7AM, please contact night-coverage at [email or web address], password [facility] 05/06/2014, 1:30 PM  LOS: 1 day

## 2014-05-07 ENCOUNTER — Encounter: Payer: Medicare Other | Admitting: Nurse Practitioner

## 2014-05-07 LAB — BASIC METABOLIC PANEL
Anion gap: 11 (ref 5–15)
BUN: 23 mg/dL (ref 6–23)
CALCIUM: 9 mg/dL (ref 8.4–10.5)
CHLORIDE: 94 mmol/L — AB (ref 96–112)
CO2: 31 mmol/L (ref 19–32)
Creatinine, Ser: 1.96 mg/dL — ABNORMAL HIGH (ref 0.50–1.10)
GFR calc non Af Amer: 22 mL/min — ABNORMAL LOW (ref >90)
GFR, EST AFRICAN AMERICAN: 26 mL/min — AB (ref >90)
Glucose, Bld: 87 mg/dL (ref 70–99)
POTASSIUM: 3.6 mmol/L (ref 3.5–5.1)
Sodium: 136 mmol/L (ref 135–145)

## 2014-05-07 NOTE — Progress Notes (Addendum)
[facility] PROGRESS NOTE  [name] ZCH:[id number] DOB: 11/06/1927 DOA: 05/05/2014 PCP: Leonard Downing, MD  Assessment/Plan: 1. Acute on chronic systolic and diastolic CHF -EF of 28% -reports compliance with medications and diuretics -Continue IV Lasix today and change to PO tomorrow -negative 2.8L and weight down 6lbs  -Cardiology following  -Held ACE due to CKD -Needs close Cards FU upon Discharge  2. CAD , status post remote CABG, aortic root root replacement  -Continue aspirin, Imdur   3. CKD 4 - stable, monitor with diuresis - Renal arterial duplex ordered by admitter  4. Hypertension  -continue amlodipine and beta blockers  5. Elevated d-dimer -Nonspecific, VQ scan low prob of PE  6. Copd: -stable  DVt proph: hep SQ  Code Status: Full Code Family Communication: none at bedside (indicate person spoken with, relationship, and if by phone, the number) Disposition Plan: Home tomorrow if stable, needs close cards FU   Consultants:  Cards   HPI/Subjective: breathing better, wondering when she can go home  Objective: Filed Vitals:   05/07/14 1038  BP: 142/58  Pulse: 79  Temp:   Resp:     Intake/Output Summary (Last 24 hours) at 05/07/14 1315 Last data filed at 05/07/14 1039  Gross per 24 hour  Intake    720 ml  Output   2000 ml  Net  -1280 ml   Filed Weights   05/05/14 2321 05/06/14 0455 05/07/14 0553  Weight: 63.05 kg (139 lb) 62.279 kg (137 lb 4.8 oz) 60.691 kg (133 lb 12.8 oz)    Exam:   General: AAOx3  Cardiovascular: S1S2/RRR  Respiratory: CTAB  Abdomen: soft, Nt, SB present  Musculoskeletal: no edema c/c  Data Reviewed: Basic Metabolic Panel:  Recent Labs Lab 05/03/14 1200 05/05/14 1853 05/06/14 0650 05/07/14 0524  NA 139 132* 136 136  K 4.0 4.1 3.7 3.6  CL 103 97 97 94*  CO2 23 22 28 31   GLUCOSE 130* 117* 92 87  BUN 24* 23 20 23   CREATININE 1.84* 1.85* 1.75* 1.96*  CALCIUM 9.0 9.1 8.8 9.0    Liver Function Tests: No results for input(s): AST, ALT, ALKPHOS, BILITOT, PROT, ALBUMIN in the last 168 hours. No results for input(s): LIPASE, AMYLASE in the last 168 hours. No results for input(s): AMMONIA in the last 168 hours. CBC:  Recent Labs Lab 05/03/14 1200 05/05/14 1853  WBC 9.0 8.7  HGB 13.6 13.8  HCT 41.4 41.2  MCV 90.8 90.0  PLT 484* 479*   Cardiac Enzymes:  Recent Labs Lab 05/05/14 2248 05/06/14 0650 05/06/14 1030  TROPONINI 0.04* 0.04* 0.04*   BNP (last 3 results)  Recent Labs  04/19/14 0420 05/03/14 1200 05/05/14 1935  BNP 885.3* 682.5* 949.1*    ProBNP (last 3 results) No results for input(s): PROBNP in the last 8760 hours.  CBG: No results for input(s): GLUCAP in the last 168 hours.  No results found for this or any previous visit (from the past 240 hour(s)).   Studies: Nm Pulmonary Perf And Vent  05/06/2014   CLINICAL DATA:  Dyspnea/short of breath. Difficulty breathing. Pulmonary embolism. Positive D-dimer. Hypoxia.  EXAM: NUCLEAR MEDICINE VENTILATION - PERFUSION LUNG SCAN  TECHNIQUE: Ventilation images were obtained in multiple projections using inhaled aerosol technetium 99 M DTPA. Perfusion images were obtained in multiple projections after intravenous injection of Tc-59m MAA.  RADIOPHARMACEUTICALS:  40.0 mCi Tc-63m DTPA aerosol and 6.0 mCi Tc-30m MAA  COMPARISON:  Chest radiograph 05/05/2014.  FINDINGS: Ventilation: Heterogeneous ventilation study with  clumping of radiotracer and a mottled appearance of the lungs. Some of this is probably due to CHF with airspace disease identified on yesterday's chest radiograph.  Perfusion: No wedge shaped peripheral perfusion defects to suggest acute pulmonary embolism.  IMPRESSION: Low probability of pulmonary embolus.   Electronically Signed   By: Dereck Ligas M.D.   On: 05/06/2014 13:12   Dg Chest Port 1 View  05/05/2014   CLINICAL DATA:  Short of breath  EXAM: PORTABLE CHEST - 1 VIEW  COMPARISON:   05/03/2014  FINDINGS: Cardiac enlargement with congestive heart failure. Vascular congestion and mild edema. Small bilateral effusions. These findings have progressed since the prior study. There is bibasilar atelectasis.  IMPRESSION: Progressive congestive heart failure with mild edema and bilateral effusions and bibasilar atelectasis.   Electronically Signed   By: Franchot Gallo M.D.   On: 05/05/2014 19:42    Scheduled Meds: . amLODipine  10 mg Oral Daily  . aspirin EC  81 mg Oral Daily  . furosemide  40 mg Intravenous BID  . heparin  5,000 Units Subcutaneous 3 times per day  . isosorbide-hydrALAZINE  1 tablet Oral BID  . metoprolol tartrate  12.5 mg Oral Daily  . pneumococcal 23 valent vaccine  0.5 mL Intramuscular Tomorrow-1000  . potassium chloride SA  10 mEq Oral Daily  . pravastatin  80 mg Oral q1800  . sodium chloride  3 mL Intravenous Q12H   Continuous Infusions:  Antibiotics Given (last 72 hours)    None      Active Problems:   Essential hypertension   Chronic combined systolic and diastolic heart failure, NYHA class 2   CKD (chronic kidney disease), stage IV   Acute on chronic combined systolic and diastolic CHF (congestive heart failure)   CHF (congestive heart failure)    Time spent: 7min    [place] [facility] Pager [phone]. If 7PM-7AM, please contact night-coverage at [email or web address], password [facility] 05/07/2014, 1:15 PM  LOS: 2 days

## 2014-05-07 NOTE — Progress Notes (Signed)
Subjective: No complaints no chest pain since admit  Objective: Vital signs in last 24 hours: Temp:  [98.3 F (36.8 C)-98.4 F (36.9 C)] 98.3 F (36.8 C) (04/25 0553) Pulse Rate:  [80-87] 80 (04/25 0553) Resp:  [16] 16 (04/25 0553) BP: (116-127)/(50-59) 117/50 mmHg (04/25 0553) SpO2:  [92 %-97 %] 97 % (04/25 0553) Weight:  [133 lb 12.8 oz (60.691 kg)] 133 lb 12.8 oz (60.691 kg) (04/25 0553) Weight change: -5 lb 3.2 oz (-2.359 kg) Last BM Date: 05/05/14 Intake/Output from previous day: -1640 ( since admit -2880) wt down 5 lbs 04/24 0701 - 04/25 0700 In: 960 [P.O.:960] Out: 2600 [Urine:2600] Intake/Output this shift:    PE: General:Pleasant affect, NAD Skin:Warm and dry, brisk capillary refill HEENT:normocephalic, sclera clear, mucus membranes moist Heart:S1S2 RRR with 2/6 holosystolic murmur- MR, no gallup, rub or click Lungs:clear without rales, rhonchi, or wheezes UGQ:BVQX, non tender, + BS, do not palpate liver spleen or masses Ext:no lower ext edema, 2+ pedal pulses, 2+ radial pulses Neuro:alert and oriented X 3, MAE, follows commands, + facial symmetry Tele:  SR with PVCs     Lab Results:  Recent Labs  05/05/14 1853  WBC 8.7  HGB 13.8  HCT 41.2  PLT 479*   BMET  Recent Labs  05/06/14 0650 05/07/14 0524  NA 136 136  K 3.7 3.6  CL 97 94*  CO2 28 31  GLUCOSE 92 87  BUN 20 23  CREATININE 1.75* 1.96*  CALCIUM 8.8 9.0    Recent Labs  05/06/14 0650 05/06/14 1030  TROPONINI 0.04* 0.04*    Lab Results  Component Value Date   CHOL 182 04/25/2014   HDL 75.50 04/25/2014   LDLCALC 88 04/25/2014   LDLDIRECT 136.2 12/26/2012   TRIG 92.0 04/25/2014   CHOLHDL 2 04/25/2014   Lab Results  Component Value Date   HGBA1C  09/24/2008    5.9 (NOTE) The ADA recommends the following therapeutic goal for glycemic control related to Hgb A1c measurement: Goal of therapy: <6.5 Hgb A1c  Reference: American Diabetes Association: Clinical Practice  Recommendations 2010, Diabetes Care, 2010, 33: (Suppl  1).     Lab Results  Component Value Date   TSH 1.98 04/22/2009     Studies/Results: Nm Pulmonary Perf And Vent  05/06/2014   CLINICAL DATA:  Dyspnea/short of breath. Difficulty breathing. Pulmonary embolism. Positive D-dimer. Hypoxia.  EXAM: NUCLEAR MEDICINE VENTILATION - PERFUSION LUNG SCAN  TECHNIQUE: Ventilation images were obtained in multiple projections using inhaled aerosol technetium 99 M DTPA. Perfusion images were obtained in multiple projections after intravenous injection of Tc-66m MAA.  RADIOPHARMACEUTICALS:  40.0 mCi Tc-56m DTPA aerosol and 6.0 mCi Tc-79m MAA  COMPARISON:  Chest radiograph 05/05/2014.  FINDINGS: Ventilation: Heterogeneous ventilation study with clumping of radiotracer and a mottled appearance of the lungs. Some of this is probably due to CHF with airspace disease identified on yesterday's chest radiograph.  Perfusion: No wedge shaped peripheral perfusion defects to suggest acute pulmonary embolism.  IMPRESSION: Low probability of pulmonary embolus.   Electronically Signed   By: Dereck Ligas M.D.   On: 05/06/2014 13:12   Dg Chest Port 1 View  05/05/2014   CLINICAL DATA:  Short of breath  EXAM: PORTABLE CHEST - 1 VIEW  COMPARISON:  05/03/2014  FINDINGS: Cardiac enlargement with congestive heart failure. Vascular congestion and mild edema. Small bilateral effusions. These findings have progressed since the prior study. There is bibasilar atelectasis.  IMPRESSION: Progressive congestive  heart failure with mild edema and bilateral effusions and bibasilar atelectasis.   Electronically Signed   By: Franchot Gallo M.D.   On: 05/05/2014 19:42    Medications: I have reviewed the patient's current medications. Scheduled Meds: . amLODipine  10 mg Oral Daily  . aspirin EC  81 mg Oral Daily  . furosemide  40 mg Intravenous BID  . heparin  5,000 Units Subcutaneous 3 times per day  . isosorbide-hydrALAZINE  1 tablet Oral  BID  . metoprolol tartrate  12.5 mg Oral Daily  . pneumococcal 23 valent vaccine  0.5 mL Intramuscular Tomorrow-1000  . potassium chloride SA  10 mEq Oral Daily  . pravastatin  80 mg Oral q1800  . sodium chloride  3 mL Intravenous Q12H   Continuous Infusions:  PRN Meds:.sodium chloride, acetaminophen, albuterol, ondansetron (ZOFRAN) IV, sodium chloride  Assessment/Plan: 1. Acute on chronic systolic CHF - unclear what tipped her over. ? Dietary noncompliance although she denies this. She also had severe MR by recent echo. Her weight is up 5 lbs from discharge. IV Lasix 40 BID and follow renal function closely. Follow I&O's and daily weights. (-2,880 since admit and weight down 6 lbs since admit)  She stated that she has had 3 hospital visits over the past month for exacerbation of CHF. (also with grade 2 diastolic dysfunction)  2. Moderate to severely reduced LVF secondary to ischemic DCM EF 30-35% by echo earlier this month. Continue BB/Bidil  3. ASCAD s/p remote CABG-2010 LIMA to LAD and free RIMA Y graft to OM off of LIMA. She also had aortic root replacement at that time secondary to dilated aortic root.  Continue ASA/long acting nitrates, in 2013 abnormal nuc study I find no caths    4. HTN - controlled on BB/amlodipine 127/51 to 117/50 she has trouble paying for the bidil may need to divide into 2 meds. And increase the isordil   5 Dyslipidemia - continue statin  6. CKD stage IV. Renal function stable with IV diuresis to slight increase 1.85-->1.75-->1.96 today. Pt has refused dialysis in the past.   7. Severe MR and mild to moderate AS by recent echo  8. Mildly elevated troponin most likely secondary to demand ischemia. + pain on admit- never had significant pain with her previous CAD.  ? if ischemia is causing her frequent episodes or valve and diastolic HF causing. Though difficult to cath with no desire to have dialysis ...  9. Elevated D-Dimer- she did have an  episode of CP prior to admission but it resolved with NTG and was most likely related to volume overload but need to consider PE. Recommend VQ scan to rule out PE - per TRH -VQ neg for PE     LOS: 2 days   Time spent with pt. :15 minutes. [facility] R  Nurse Practitioner Certified Pager [phone] or after 5pm and on weekends call [phone] 05/07/2014, 9:51 AM  Patient seen, examined. Available data reviewed. Agree with findings, assessment, and plan as outlined by Cecilie Kicks, NP. The patient is independently interviewed and examined. This is a delightful 81 year old woman who lives independently. Exam reveals clear lung fields with the heart regular rate and rhythm and a grade 3/6 holosystolic murmur at the apex. There is no peripheral edema. The patient has acute on chronic systolic heart failure complicated by severe ischemic mitral regurgitation and stage IV chronic kidney disease. I agree with plans as outlined above with transitioning back to oral furosemide tomorrow after continuing IV  furosemide today. I agree that she is not a candidate for ACE or ARB because of stage IV chronic kidney disease.  Sherren Mocha, M.D. 05/07/2014 2:13 PM

## 2014-05-07 NOTE — Progress Notes (Signed)
SATURATION QUALIFICATIONS: (This note is used to comply with regulatory documentation for home oxygen)  Patient Saturations on Room Air at Rest = 87%      

## 2014-05-07 NOTE — Progress Notes (Signed)
Utilization review completed. Deone Leifheit, RN, BSN. 

## 2014-05-08 ENCOUNTER — Telehealth: Payer: Self-pay | Admitting: Cardiology

## 2014-05-08 DIAGNOSIS — I255 Ischemic cardiomyopathy: Secondary | ICD-10-CM

## 2014-05-08 LAB — BASIC METABOLIC PANEL
Anion gap: 11 (ref 5–15)
BUN: 28 mg/dL — AB (ref 6–23)
CALCIUM: 9 mg/dL (ref 8.4–10.5)
CO2: 29 mmol/L (ref 19–32)
CREATININE: 1.88 mg/dL — AB (ref 0.50–1.10)
Chloride: 97 mmol/L (ref 96–112)
GFR, EST AFRICAN AMERICAN: 27 mL/min — AB (ref >90)
GFR, EST NON AFRICAN AMERICAN: 23 mL/min — AB (ref >90)
Glucose, Bld: 89 mg/dL (ref 70–99)
Potassium: 3.4 mmol/L — ABNORMAL LOW (ref 3.5–5.1)
Sodium: 137 mmol/L (ref 135–145)

## 2014-05-08 MED ORDER — POTASSIUM CHLORIDE CRYS ER 20 MEQ PO TBCR
40.0000 meq | EXTENDED_RELEASE_TABLET | Freq: Every day | ORAL | Status: DC
  Administered 2014-05-08: 40 meq via ORAL
  Filled 2014-05-08: qty 2

## 2014-05-08 MED ORDER — ZOLPIDEM TARTRATE 5 MG PO TABS
2.5000 mg | ORAL_TABLET | Freq: Every evening | ORAL | Status: DC | PRN
  Administered 2014-05-08: 2.5 mg via ORAL
  Filled 2014-05-08: qty 1

## 2014-05-08 MED ORDER — FUROSEMIDE 40 MG PO TABS: 40.0000 mg | ORAL_TABLET | Freq: Every day | ORAL | Status: DC

## 2014-05-08 NOTE — Progress Notes (Signed)
SATURATION QUALIFICATIONS: (This note is used to comply with regulatory documentation for home oxygen)  Patient Saturations on Room Air at Rest = 95%  Patient Saturations on Room Air while Ambulating = 91-93%

## 2014-05-08 NOTE — Care Management Note (Signed)
    Page 1 of 1   05/08/2014     3:52:27 PM CARE MANAGEMENT NOTE 05/08/2014  Patient:  [name], [name]   Account Number:  [id number]  Date Initiated:  05/08/2014  Documentation initiated by:  Kalii Chesmore  Subjective/Objective Assessment:   Pt adm on 05/05/14 with CHF exacerbation.  PTA, pt resides at home alone and is independent.     Action/Plan:   Pt for dc home today; she has no dc needs.  She was evaluated for home oxygen, and did not qualify.   Anticipated DC Date:  05/08/2014   Anticipated DC Plan:  [place]  CM consult      Choice offered to / List presented to:             Status of service:  Completed, signed off Medicare Important Message given?  YES (If response is "NO", the following Medicare IM given date fields will be blank) Date Medicare IM given:  05/08/2014 Medicare IM given by:  Emanie Behan Date Additional Medicare IM given:   Additional Medicare IM given by:    Discharge Disposition:  HOME/SELF CARE  Per UR Regulation:  Reviewed for med. necessity/level of care/duration of stay  If discussed at [place] of Stay Meetings, dates discussed:    Comments:

## 2014-05-08 NOTE — Telephone Encounter (Signed)
TCM patient. Call on 4/27

## 2014-05-08 NOTE — Progress Notes (Signed)
Patient Name: JANIQUE HOEFER Date of Encounter: 05/08/2014   Active Problems:   ICM-EF 30-35% echo 04/20/14,  40% by echo June 2013, 31% by Myoview   Acute on chronic combined systolic and diastolic CHF (congestive heart failure)   Moderate MR   Essential hypertension   CAD- RCA BMS July 2010, CABG x 2 and AO root Sept 2010   CKD (chronic kidney disease), stage IV   Dyslipidemia   COPD (chronic obstructive pulmonary disease)    SUBJECTIVE  No chest pain or sob.  Feels well.  Hoping to go home.  CURRENT MEDS . amLODipine  10 mg Oral Daily  . aspirin EC  81 mg Oral Daily  . [START ON 05/09/2014] furosemide  40 mg Oral Daily  . heparin  5,000 Units Subcutaneous 3 times per day  . isosorbide-hydrALAZINE  1 tablet Oral BID  . metoprolol tartrate  12.5 mg Oral Daily  . pneumococcal 23 valent vaccine  0.5 mL Intramuscular Tomorrow-1000  . potassium chloride SA  10 mEq Oral Daily  . pravastatin  80 mg Oral q1800  . sodium chloride  3 mL Intravenous Q12H    OBJECTIVE  Filed Vitals:   05/07/14 1801 05/07/14 2042 05/08/14 0703 05/08/14 0837  BP: 120/46 128/55 120/54 110/45  Pulse: 93 88 84 80  Temp:  98.6 F (37 C) 97.6 F (36.4 C)   TempSrc:  Oral Oral   Resp:  17 16   Height:      Weight:   133 lb 14.4 oz (60.737 kg)   SpO2: 94% 93% 92%     Intake/Output Summary (Last 24 hours) at 05/08/14 0921 Last data filed at 05/08/14 0840  Gross per 24 hour  Intake   1783 ml  Output   1000 ml  Net    783 ml   Filed Weights   05/06/14 0455 05/07/14 0553 05/08/14 0703  Weight: 137 lb 4.8 oz (62.279 kg) 133 lb 12.8 oz (60.691 kg) 133 lb 14.4 oz (60.737 kg)    PHYSICAL EXAM  General: Pleasant, NAD. Neuro: Alert and oriented X 3. Moves all extremities spontaneously. Psych: Normal affect. HEENT:  Normal  Neck: Supple without bruits or JVD. Lungs:  Resp regular and unlabored, CTA. Heart: RRR no s3, s4, 3/6 syst murmur @ apex  axilla. Abdomen: Soft, non-tender,  non-distended, BS + x 4.  Extremities: No clubbing, cyanosis or edema. DP/PT/Radials 2+ and equal bilaterally.  Accessory Clinical Findings  CBC  Recent Labs  05/05/14 1853  WBC 8.7  HGB 13.8  HCT 41.2  MCV 90.0  PLT 427*   Basic Metabolic Panel  Recent Labs  05/07/14 0524 05/08/14 0528  NA 136 137  K 3.6 3.4*  CL 94* 97  CO2 31 29  GLUCOSE 87 89  BUN 23 28*  CREATININE 1.96* 1.88*  CALCIUM 9.0 9.0   Cardiac Enzymes  Recent Labs  05/05/14 2248 05/06/14 0650 05/06/14 1030  TROPONINI 0.04* 0.04* 0.04*   D-Dimer  Recent Labs  05/05/14 1935  DDIMER 0.87*   TELE  Rsr, brief run of PAT.  Radiology/Studies  Nm Pulmonary Perf And Vent  05/06/2014   CLINICAL DATA:  Dyspnea/short of breath. Difficulty breathing. Pulmonary embolism. Positive D-dimer. Hypoxia.  EXAM: NUCLEAR MEDICINE VENTILATION - PERFUSION LUNG SCAN  TECHNIQUE: Ventilation images were obtained in multiple projections using inhaled aerosol technetium 99 M DTPA. Perfusion images were obtained in multiple projections after intravenous injection of Tc-93m MAA.  RADIOPHARMACEUTICALS:  40.0 mCi Tc-51m DTPA  aerosol and 6.0 mCi Tc-62m MAA  COMPARISON:  Chest radiograph 05/05/2014.  FINDINGS: Ventilation: Heterogeneous ventilation study with clumping of radiotracer and a mottled appearance of the lungs. Some of this is probably due to CHF with airspace disease identified on yesterday's chest radiograph.  Perfusion: No wedge shaped peripheral perfusion defects to suggest acute pulmonary embolism.  IMPRESSION: Low probability of pulmonary embolus.   Electronically Signed   By: Dereck Ligas M.D.   On: 05/06/2014 13:12   Dg Chest Port 1 View  05/05/2014   CLINICAL DATA:  Short of breath  EXAM: PORTABLE CHEST - 1 VIEW  COMPARISON:  05/03/2014  FINDINGS: Cardiac enlargement with congestive heart failure. Vascular congestion and mild edema. Small bilateral effusions. These findings have progressed since the prior  study. There is bibasilar atelectasis.  IMPRESSION: Progressive congestive heart failure with mild edema and bilateral effusions and bibasilar atelectasis.   Electronically Signed   By: Franchot Gallo M.D.   On: 05/05/2014 19:42   ASSESSMENT AND PLAN  1.  Acute on chronic combined systolic/diastolic CHF/ICM:  EF 11-17%, Gr 2 DD (04/20/2014).  Net neg of 2L this admission with 6 lbs weight loss to 133 yesterday/today.  Feels good and is euvolemic on exam.  BUN up/creat/bicart down.  Current weight is lowest recorded in Epic dating back to 12/2010.  Lasix transitioned to PO today.  Cont bb/bidil.  No acei/arb/arni/spiro 2/2 CKD IV.  We discussed the importance of daily weights, sodium restriction, medication compliance, and symptom reporting and she verbalizes understanding.  I will arrange for f/u in our office in 1 week.  2.  CAD:  S/p CABG x 2.  No chest pain.  Flat troponins.  Cont asa, nitrates, bb, statin.  3.  HTN:  Stable.   4.  HL:  Cont statin.  LDL 88.  LFT's wnl earlier this month.  5.  CKD IV:  Stable.  6.  Severe ischemic mitral regurgitation:  Conservative therapy.  7.  Hypokalemia:  supp.  Signed, Murray Hodgkins NP  Patient seen, examined. Available data reviewed. Agree with findings, assessment, and plan as outlined by Ignacia Bayley, NP. The patient is resting comfortably in bed. Her daughter is at the bedside. Exam reveals a pleasant, alert and oriented elderly woman in no distress. Lung fields are clear. Heart is regular rate and rhythm with a grade 3/6 holosystolic murmur heard at the left lower sternal border and apex. There is no peripheral edema. I agree with the findings, assessment, and plan as outlined above. The patient is stable for hospital discharge. Follow-up has been arranged next week. She informs me that Bidil is cost prohibitive and we will change her prescriptions to Imdur and hydralazine. Otherwise continue current medical program as outlined. We discussed  consideration of percutaneous approaches to mitral regurgitation if she has recurrent heart failure on optimal medical therapy, but she does not appear to be interested in this at the present time.  Sherren Mocha, M.D. 05/08/2014 10:37 AM

## 2014-05-08 NOTE — Telephone Encounter (Signed)
New message     TCM appt on 05-15-14 with [name].

## 2014-05-08 NOTE — Progress Notes (Signed)
DC IV, DC Tele, DC Home. Discharge instructions and home medications discussed with patient and patient's daughter. Patient and daughter denied any questions or concerns at this time. Patient leaving unit via wheelchair and appears in no acute distress. 

## 2014-05-09 MED ORDER — ISOSORBIDE MONONITRATE ER 30 MG PO TB24: 30.0000 mg | ORAL_TABLET | Freq: Every day | ORAL | Status: DC

## 2014-05-09 MED ORDER — HYDRALAZINE HCL 25 MG PO TABS: 25.0000 mg | ORAL_TABLET | Freq: Three times a day (TID) | ORAL | Status: DC

## 2014-05-09 NOTE — Telephone Encounter (Signed)
Reviewed with Dr. Acie Fredrickson and pt should stop Bidil.  She should start hydralazine 25 mg by mouth three times daily and Imdur 30 mg by mouth daily. I spoke with pt and gave her these instructions. Prescriptions sent to [facility] on [address]

## 2014-05-09 NOTE — Telephone Encounter (Signed)
Patient contacted regarding discharge from  [facility] on May 08, 2014  Patient understands to follow up with provider  Tarri Glenn, [place] on May 15, 2014 at 9:00.  Patient understands discharge instructions? yes Patient understands medications and regiment? yes Patient understands to bring all medications to this visit? yes   Pt reports she is unable to afford Bidil.  Has enough to last until tomorrow AM. She reports she was going to be changed to cheaper medication but nothing was called in.  Chart reviewed and pt was going to be changed to Imdur and Hydralazine.  Will review with provider in office and call pt back.

## 2014-05-09 NOTE — Telephone Encounter (Signed)
Tried to reach pt but phone call would not go through.  Did not ring.  Will continue to try to reach pt.

## 2014-05-15 ENCOUNTER — Encounter: Payer: Self-pay | Admitting: Cardiology

## 2014-05-15 ENCOUNTER — Ambulatory Visit (INDEPENDENT_AMBULATORY_CARE_PROVIDER_SITE_OTHER): Payer: Medicare Other | Admitting: Cardiology

## 2014-05-15 VITALS — BP 120/58 | HR 83 | Ht 66.0 in | Wt 136.0 lb

## 2014-05-15 DIAGNOSIS — I739 Peripheral vascular disease, unspecified: Secondary | ICD-10-CM

## 2014-05-15 DIAGNOSIS — N289 Disorder of kidney and ureter, unspecified: Secondary | ICD-10-CM

## 2014-05-15 DIAGNOSIS — I255 Ischemic cardiomyopathy: Secondary | ICD-10-CM

## 2014-05-15 DIAGNOSIS — I2510 Atherosclerotic heart disease of native coronary artery without angina pectoris: Secondary | ICD-10-CM

## 2014-05-15 DIAGNOSIS — I5042 Chronic combined systolic (congestive) and diastolic (congestive) heart failure: Secondary | ICD-10-CM

## 2014-05-15 DIAGNOSIS — I5043 Acute on chronic combined systolic (congestive) and diastolic (congestive) heart failure: Secondary | ICD-10-CM

## 2014-05-15 DIAGNOSIS — I10 Essential (primary) hypertension: Secondary | ICD-10-CM

## 2014-05-15 DIAGNOSIS — N184 Chronic kidney disease, stage 4 (severe): Secondary | ICD-10-CM

## 2014-05-15 NOTE — Assessment & Plan Note (Signed)
Controlled.  

## 2014-05-15 NOTE — Assessment & Plan Note (Signed)
Dopplers checked during her admission April 2016- no change, no symptoms

## 2014-05-15 NOTE — Assessment & Plan Note (Signed)
Abnormal Myoview 2013 but pt asymptomatic.

## 2014-05-15 NOTE — Progress Notes (Addendum)
05/15/2014 [name]   24-May-1927  [id number]  Primary Physician Candice [place]ing, MD Primary Cardiologist: Dr [name]  HPI:  81 y/o female, [place], still drives during the day, developed increasing dyspnea and was admitted through the ED 04/19/14. She is followed by Dr [name]. She has history of CAD, ischemic cardiomyopathy, chronic systolic CHF, HTN, HLD, tobacco abuse, COPD, CKD, PAD, and mitral regurgitation. She had an inferior MI in July 2010 treated with a bare metal stent in the RCA, followed by CABG September 2010 with LIMA to LAD and free RIMA Y graft to OM off of LIMA. She also had aortic root replacement at that time. She is known to have severe PAD. Carotid artery dopplers August 2015 with moderate 60-79% bilateral stenosis, this was unchanged during her recent admission. She is followed by Dr. Marval [name] for CRI IV and her renal function has been stable.  Echo done June 2013 in [place] showed  LVEF 40%, moderate MR. Her EF by Myoview June 2013 was 31%. Her Myoview was abnormal but she was doing well and it was decided not to cath her then.           The pt was admitted through the ED 4/7/-04/22/14 in respiratory distress. She required BiPap. She responded to IV Lasix. Her wgt was 141 on admission. She was discharged and re admitted 05/06/14 again with recurrent CHF.Marland Kitchen She admits she had been eating frozen pizzas. She is seen today in the office for follow up and has been doing well. Her wgt is stable at 135 lbs. She is watching her total fluid intake and staying away from salty foods. Her daughter accompanied her today. Her daughter asked if there were any options for "fixing her valve".    Current Outpatient Prescriptions  Medication Sig Dispense Refill  . albuterol (PROVENTIL HFA;VENTOLIN HFA) 108 (90 BASE) MCG/ACT inhaler Inhale 2 puffs into the lungs every 4 (four) hours as needed for wheezing or shortness of breath.     Marland Kitchen amLODipine (NORVASC)  10 MG tablet TAKE ONE TABLET BY MOUTH ONCE DAILY 30 tablet 5  . aspirin EC 81 MG tablet Take 81 mg by mouth daily.    . Coenzyme Q-10 100 MG capsule Take 100 mg by mouth daily.      . furosemide (LASIX) 40 MG tablet Take 1 tablet (40 mg total) by mouth daily. 30 tablet 6  . hydrALAZINE (APRESOLINE) 25 MG tablet Take 1 tablet (25 mg total) by mouth 3 (three) times daily. 90 tablet 3  . isosorbide mononitrate (IMDUR) 30 MG 24 hr tablet Take 1 tablet (30 mg total) by mouth daily. 30 tablet 3  . Melatonin 5 MG TABS Take 15 mg by mouth at bedtime as needed (sleep).    . metoprolol tartrate (LOPRESSOR) 25 MG tablet Take 0.5 tablets (12.5 mg total) by mouth daily. 60 tablet 5  . nitroGLYCERIN (NITROSTAT) 0.4 MG SL tablet Place 1 tablet (0.4 mg total) under the tongue every 5 (five) minutes as needed. (Patient taking differently: Place 0.4 mg under the tongue every 5 (five) minutes as needed for chest pain. ) 25 tablet 6  . potassium chloride SA (K-DUR,KLOR-CON) 10 MEQ tablet Take 1 tablet (10 mEq total) by mouth daily. 30 tablet 6  . pravastatin (PRAVACHOL) 80 MG tablet TAKE ONE TABLET BY MOUTH ONCE DAILY IN THE EVENING (Patient taking differently: TAKE ONE TABLET BY MOUTH ONCE DAILY IN THE afternoon) 30 tablet 11   No  current facility-administered medications for this visit.    No Known Allergies  History   Social History  . Marital Status: Widowed    Spouse Name: N/A  . Number of Children: Y  . Years of Education: N/A   Occupational History  . 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    Social History Main Topics  . Smoking status: Former Smoker -- 1.00 packs/day for 60 years    Types: Cigarettes    Quit date: 07/12/2008  . Smokeless tobacco: Never Used  . Alcohol Use: No  . Drug Use: No  . Sexual Activity: Not on file   Other Topics Concern  . Not on file   Social History Narrative   She lives in [name]. She is retired and widowed.      Review of Systems: General:  negative for chills, fever, night sweats or weight changes.  Cardiovascular: negative for chest pain, dyspnea on exertion, edema, orthopnea, palpitations, paroxysmal nocturnal dyspnea or shortness of breath Dermatological: negative for rash Respiratory: negative for cough or wheezing Urologic: negative for hematuria Abdominal: negative for nausea, vomiting, diarrhea, bright red blood per rectum, melena, or hematemesis Neurologic: negative for visual changes, syncope, or dizziness All other systems reviewed and are otherwise negative except as noted above.    Blood pressure 120/58, pulse 83, height 5\' 6"  (1.676 m), weight 136 lb (61.689 kg).  General appearance: alert, cooperative, appears stated age and no distress Neck: no JVD and bilateral carotid bruits Lungs: clear to auscultation bilaterally Heart: regular rate and rhythm and 2/6 MR murmur and short 2/6 AS murmur, preserved S2 Abdomen: soft, non-tender; bowel sounds normal; no masses,  no organomegaly Extremities: no edema, varicosities noted Pulses: diminnished distal pulses Skin: pale, cool, dry Neurologic: Grossly normal  EKG NSR, IVCD, LAD  ASSESSMENT AND PLAN:   Acute on chronic combined systolic and diastolic CHF (congestive heart failure) Hospitalized 4/7/-04/22/14, and again 4/24-4/26/16   ICM-EF 30-35% echo 04/20/14,  40% by echo June 2013, 31% by Myoview Echo showed new LVD with severe MR, mild Mod AS, LAE, and grade 2 diastolic dysfunction   CKD (chronic kidney disease), stage IV Stable, followed by Dr [name]   CAD- RCA BMS July 2010, CABG x 2 and AO root Sept 2010 Abnormal Myoview 2013 but pt asymptomatic.   PVD - bilat ICA 60-79% Dopplers checked during her admission April 2016- no change, no symptoms   Essential hypertension Controlled    PLAN  Ms [name] appears to be doing well. I told the pt and her daughter I would send her report to Dr [name] and ask him about the possibility of a  referral to another facility for consideration of percutaneous MV repair (since her daughter asked).  At this point Ms [name] is stable and I would continue her current medications. She knows to contact us if she has a > 3-5 lb wgt gain. BMP ion a week and Dr [name] in 3 months.  Jacolby Risby KPA-C 05/15/2014 9:30 AM

## 2014-05-15 NOTE — Assessment & Plan Note (Addendum)
Echo showed new LVD with severe MR, mild Mod AS, LAE, and grade 2 diastolic dysfunction

## 2014-05-15 NOTE — Assessment & Plan Note (Signed)
Hospitalized 4/7/-04/22/14.

## 2014-05-15 NOTE — Addendum Note (Signed)
Addended by: Erlene Quan on: 05/15/2014 09:55 AM   Modules accepted: Level of Service

## 2014-05-15 NOTE — Assessment & Plan Note (Signed)
Stable, followed by Dr Arty Baumgartner

## 2014-05-15 NOTE — Patient Instructions (Signed)
Medication Instructions:  Your physician recommends that you continue on your current medications as directed. Please refer to the Current Medication list given to you today.   Labwork: BMET  in a week   Testing/Procedures: NONE AT THIS TIME  Follow-Up:  WITH DR Angelena Form IN 3 MONTHS  Any Other Special Instructions Will Be Listed Below (If Applicable).

## 2014-05-16 NOTE — Discharge Summary (Signed)
Physician Discharge Summary  [name] QBH:[id number] DOB: May 21, 1927 DOA: 05/05/2014  PCP: Leonard Downing, MD  Admit date: 05/05/2014 Discharge date: 05/08/2014  Time spent:45 minutes  Recommendations for Outpatient Follow-up:  1. [name] 5/3  Discharge Diagnoses:    Acute on chronic combined systolic and diastolic CHF (congestive heart failure)   CKD (chronic kidney disease), stage IV   ICM-EF 30-35% echo 04/20/14,  40% by echo June 2013, 31% by Myoview   Dyslipidemia   Moderate MR   Essential hypertension   CAD- RCA BMS July 2010, CABG x 2 and AO root Sept 2010   COPD (chronic obstructive pulmonary disease)  Discharge Condition: stable  Diet recommendation: low sodium, heart healthy   Filed Weights   05/06/14 0455 05/07/14 0553 05/08/14 0703  Weight: 62.279 kg (137 lb 4.8 oz) 60.691 kg (133 lb 12.8 oz) 60.737 kg (133 lb 14.4 oz)    History of present illness:  Chief Complaint: SOB HPI: [name] is a 81 y.o. female with h/o ischemic cardiomyopathy, systolic CHF, EF 09-73% on echo earlier this month. Just admitted from 4/7 to 4/10 for CHF exacerbation. She presented to the ED with 1 day history of SOB. There was an associated episode of chest pressure that woke her from sleep   Hospital Course:  1. Acute on chronic systolic and diastolic CHF -EF of 53% -reported compliance with medications and diuretics -Diuresed with IV Lasix, negative 2.8L and weight down 6lbs  -Followed by Cardiology through this admission, lasix changed to Po at discharge 40mg  -Held ACE due to CKD -Needs close Cards FU upon Discharge, arranged for 5/3  2. CAD , status post remote CABG, aortic root root replacement  -Continue aspirin, Imdur   3. CKD 4 - stable, monitor with diuresis - Renal arterial duplex ordered by admitter  4. Hypertension  -continue amlodipine and beta blockers  5. Elevated d-dimer -Nonspecific, VQ scan low prob of PE  6.  Copd: -stable   Consultations:  Cardiology  Discharge Exam: Filed Vitals:   05/08/14 1039  BP: 131/68  Pulse: 104  Temp:   Resp:     General: AAox3 Cardiovascular: G9J2/EQA, systolic murmur Respiratory: CTAB  Discharge Instructions   Discharge Instructions    Diet - low sodium heart healthy    Complete by:  As directed      Increase activity slowly    Complete by:  As directed           Discharge Medication List as of 05/08/2014 12:11 PM    CONTINUE these medications which have NOT CHANGED   Details  albuterol (PROVENTIL HFA;VENTOLIN HFA) 108 (90 BASE) MCG/ACT inhaler Inhale 2 puffs into the lungs every 4 (four) hours as needed for wheezing or shortness of breath. , Until Discontinued, Historical Med    amLODipine (NORVASC) 10 MG tablet TAKE ONE TABLET BY MOUTH ONCE DAILY, Normal    aspirin EC 81 MG tablet Take 81 mg by mouth daily., Until Discontinued, Historical Med    Coenzyme Q-10 100 MG capsule Take 100 mg by mouth daily.  , Until Discontinued, Historical Med    furosemide (LASIX) 40 MG tablet Take 1 tablet (40 mg total) by mouth daily., Starting 04/23/2014, Until Discontinued, Normal    metoprolol tartrate (LOPRESSOR) 25 MG tablet Take 0.5 tablets (12.5 mg total) by mouth daily., Starting 04/22/2014, Until Discontinued, Normal    nitroGLYCERIN (NITROSTAT) 0.4 MG SL tablet Place 1 tablet (0.4 mg total) under the tongue every 5 (five) minutes  as needed., Starting 04/13/2014, Until Discontinued, Normal    potassium chloride SA (K-DUR,KLOR-CON) 10 MEQ tablet Take 1 tablet (10 mEq total) by mouth daily., Starting 04/22/2014, Until Discontinued, Normal    pravastatin (PRAVACHOL) 80 MG tablet TAKE ONE TABLET BY MOUTH ONCE DAILY IN THE EVENING, Normal    isosorbide-hydrALAZINE (BIDIL) 20-37.5 MG per tablet Take 1 tablet by mouth 2 (two) times daily., Starting 04/22/2014, Until Discontinued, Normal    Melatonin 5 MG TABS Take 15 mg by mouth at bedtime as needed (sleep).,  Until Discontinued, Historical Med       No Known Allergies Follow-up Information    Follow up with Erlene Quan, PA-C On 05/15/2014.   Specialty:  Cardiology   Why:  9:00 AM   Contact information:   [place] [address] [phone]       Follow up with Leonard Downing, MD. Schedule an appointment as soon as possible for a visit in 1 week.   Specialty:  Family Medicine   Why:  Appt. on May 3rd @ 11;00 am   Contact information:   [place] [address] [phone]        The results of significant diagnostics from this hospitalization (including imaging, microbiology, ancillary and laboratory) are listed below for reference.    Significant Diagnostic Studies: Dg Chest 2 View  04/22/2014   CLINICAL DATA:  Shortness of breath  EXAM: CHEST  2 VIEW  COMPARISON:  04/19/2014  FINDINGS: Cardiomegaly and CABG changes again noted.  The cardiomediastinal silhouette is unchanged.  Bibasilar atelectasis and probable small effusions again noted.  A large hiatal hernia is again identified.  There is no evidence of pneumothorax.  IMPRESSION: Resolved pulmonary edema. Continued mild bibasilar atelectasis and small bilateral pleural effusions.  Cardiomegaly and large hiatal hernia again noted.   Electronically Signed   By: Margarette Canada M.D.   On: 04/22/2014 09:33   Nm Pulmonary Perf And Vent  05/06/2014   CLINICAL DATA:  Dyspnea/short of breath. Difficulty breathing. Pulmonary embolism. Positive D-dimer. Hypoxia.  EXAM: NUCLEAR MEDICINE VENTILATION - PERFUSION LUNG SCAN  TECHNIQUE: Ventilation images were obtained in multiple projections using inhaled aerosol technetium 99 M DTPA. Perfusion images were obtained in multiple projections after intravenous injection of Tc-36m MAA.  RADIOPHARMACEUTICALS:  40.0 mCi Tc-29m DTPA aerosol and 6.0 mCi Tc-8m MAA  COMPARISON:  Chest radiograph 05/05/2014.  FINDINGS: Ventilation: Heterogeneous ventilation study  with clumping of radiotracer and a mottled appearance of the lungs. Some of this is probably due to CHF with airspace disease identified on yesterday's chest radiograph.  Perfusion: No wedge shaped peripheral perfusion defects to suggest acute pulmonary embolism.  IMPRESSION: Low probability of pulmonary embolus.   Electronically Signed   By: Dereck Ligas M.D.   On: 05/06/2014 13:12   Dg Chest Port 1 View  05/05/2014   CLINICAL DATA:  Short of breath  EXAM: PORTABLE CHEST - 1 VIEW  COMPARISON:  05/03/2014  FINDINGS: Cardiac enlargement with congestive heart failure. Vascular congestion and mild edema. Small bilateral effusions. These findings have progressed since the prior study. There is bibasilar atelectasis.  IMPRESSION: Progressive congestive heart failure with mild edema and bilateral effusions and bibasilar atelectasis.   Electronically Signed   By: Franchot Gallo M.D.   On: 05/05/2014 19:42   Dg Chest Port 1 View  05/03/2014   CLINICAL DATA:  Shortness of breath with chest pressure/pain.  COPD.  EXAM: PORTABLE CHEST - 1 VIEW  COMPARISON:  04/22/2014  FINDINGS: Patient slightly rotated to the right. Sternotomy wires unchanged. Lungs are adequately inflated with worsening mild right basilar opacification with blunting of the right costophrenic angle as findings may be due to atelectasis versus infection with small effusion. There is evidence of patient's large hiatal hernia containing a air which projects over the retrocardiac region and medial right base. Mild stable cardiomegaly. Mild calcified plaque over the thoracic aorta. Remainder of the exam is unchanged.  IMPRESSION: Worsening opacification in the right base likely atelectasis versus infection with small right pleural effusion.  Known large hiatal hernia containing air in the retrocardiac region and projecting to the medial right base.  Stable cardiomegaly.   Electronically Signed   By: Marin Olp M.D.   On: 05/03/2014 13:45   Dg Chest  Port 1 View  04/19/2014   CLINICAL DATA:  Shortness of breath, fever, and cough.  EXAM: PORTABLE CHEST - 1 VIEW  COMPARISON:  05/07/2013  FINDINGS: Postoperative changes in the mediastinum. Cardiac enlargement similar prior study. There is interval development of pulmonary vascular congestion with perihilar airspace disease and interstitial disease consistent with edema. Small bilateral pleural effusions are developing. No pneumothorax. Calcification of the aorta.  IMPRESSION: Cardiac enlargement with developing vascular congestion, edema, and small effusions.   Electronically Signed   By: Lucienne Capers M.D.   On: 04/19/2014 04:36    Microbiology: No results found for this or any previous visit (from the past 240 hour(s)).   Labs: Basic Metabolic Panel: No results for input(s): NA, K, CL, CO2, GLUCOSE, BUN, CREATININE, CALCIUM, MG, PHOS in the last 168 hours. Liver Function Tests: No results for input(s): AST, ALT, ALKPHOS, BILITOT, PROT, ALBUMIN in the last 168 hours. No results for input(s): LIPASE, AMYLASE in the last 168 hours. No results for input(s): AMMONIA in the last 168 hours. CBC: No results for input(s): WBC, NEUTROABS, HGB, HCT, MCV, PLT in the last 168 hours. Cardiac Enzymes: No results for input(s): CKTOTAL, CKMB, CKMBINDEX, TROPONINI in the last 168 hours. BNP: BNP (last 3 results)  Recent Labs  04/19/14 0420 05/03/14 1200 05/05/14 1935  BNP 885.3* 682.5* 949.1*    ProBNP (last 3 results) No results for input(s): PROBNP in the last 8760 hours.  CBG: No results for input(s): GLUCAP in the last 168 hours.     SignedDomenic Polite  [facility] 05/16/2014, 4:06 PM

## 2014-05-22 ENCOUNTER — Emergency Department (HOSPITAL_COMMUNITY): Payer: Medicare Other

## 2014-05-22 ENCOUNTER — Encounter (HOSPITAL_COMMUNITY): Payer: Self-pay | Admitting: Emergency Medicine

## 2014-05-22 ENCOUNTER — Other Ambulatory Visit: Payer: Medicare Other

## 2014-05-22 ENCOUNTER — Inpatient Hospital Stay (HOSPITAL_COMMUNITY)
Admission: EM | Admit: 2014-05-22 | Discharge: 2014-05-23 | DRG: 291 | Disposition: A | Discharge status: Home / Self Care | Payer: Medicare Other | Attending: Family Medicine | Admitting: Family Medicine

## 2014-05-22 DIAGNOSIS — Z806 Family history of leukemia: Secondary | ICD-10-CM

## 2014-05-22 DIAGNOSIS — Z87891 Personal history of nicotine dependence: Secondary | ICD-10-CM | POA: Diagnosis not present

## 2014-05-22 DIAGNOSIS — I5021 Acute systolic (congestive) heart failure: Secondary | ICD-10-CM

## 2014-05-22 DIAGNOSIS — I252 Old myocardial infarction: Secondary | ICD-10-CM

## 2014-05-22 DIAGNOSIS — J479 Bronchiectasis, uncomplicated: Secondary | ICD-10-CM

## 2014-05-22 DIAGNOSIS — Z7951 Long term (current) use of inhaled steroids: Secondary | ICD-10-CM | POA: Diagnosis not present

## 2014-05-22 DIAGNOSIS — N184 Chronic kidney disease, stage 4 (severe): Secondary | ICD-10-CM | POA: Diagnosis present

## 2014-05-22 DIAGNOSIS — I2510 Atherosclerotic heart disease of native coronary artery without angina pectoris: Secondary | ICD-10-CM | POA: Diagnosis present

## 2014-05-22 DIAGNOSIS — N289 Disorder of kidney and ureter, unspecified: Secondary | ICD-10-CM

## 2014-05-22 DIAGNOSIS — I10 Essential (primary) hypertension: Secondary | ICD-10-CM | POA: Diagnosis not present

## 2014-05-22 DIAGNOSIS — I5042 Chronic combined systolic (congestive) and diastolic (congestive) heart failure: Secondary | ICD-10-CM | POA: Diagnosis present

## 2014-05-22 DIAGNOSIS — I255 Ischemic cardiomyopathy: Secondary | ICD-10-CM | POA: Diagnosis present

## 2014-05-22 DIAGNOSIS — I700 Atherosclerosis of aorta: Secondary | ICD-10-CM | POA: Diagnosis present

## 2014-05-22 DIAGNOSIS — Z955 Presence of coronary angioplasty implant and graft: Secondary | ICD-10-CM | POA: Diagnosis not present

## 2014-05-22 DIAGNOSIS — Z951 Presence of aortocoronary bypass graft: Secondary | ICD-10-CM | POA: Diagnosis not present

## 2014-05-22 DIAGNOSIS — I130 Hypertensive heart and chronic kidney disease with heart failure and stage 1 through stage 4 chronic kidney disease, or unspecified chronic kidney disease: Principal | ICD-10-CM | POA: Diagnosis present

## 2014-05-22 DIAGNOSIS — E785 Hyperlipidemia, unspecified: Secondary | ICD-10-CM | POA: Diagnosis present

## 2014-05-22 DIAGNOSIS — I5023 Acute on chronic systolic (congestive) heart failure: Secondary | ICD-10-CM | POA: Diagnosis present

## 2014-05-22 DIAGNOSIS — I70213 Atherosclerosis of native arteries of extremities with intermittent claudication, bilateral legs: Secondary | ICD-10-CM | POA: Diagnosis present

## 2014-05-22 DIAGNOSIS — E780 Pure hypercholesterolemia: Secondary | ICD-10-CM | POA: Diagnosis present

## 2014-05-22 DIAGNOSIS — Z79899 Other long term (current) drug therapy: Secondary | ICD-10-CM | POA: Diagnosis not present

## 2014-05-22 DIAGNOSIS — Z7982 Long term (current) use of aspirin: Secondary | ICD-10-CM

## 2014-05-22 DIAGNOSIS — Z8249 Family history of ischemic heart disease and other diseases of the circulatory system: Secondary | ICD-10-CM | POA: Diagnosis not present

## 2014-05-22 DIAGNOSIS — R778 Other specified abnormalities of plasma proteins: Secondary | ICD-10-CM

## 2014-05-22 DIAGNOSIS — I509 Heart failure, unspecified: Secondary | ICD-10-CM

## 2014-05-22 DIAGNOSIS — R7989 Other specified abnormal findings of blood chemistry: Secondary | ICD-10-CM

## 2014-05-22 DIAGNOSIS — R079 Chest pain, unspecified: Secondary | ICD-10-CM

## 2014-05-22 LAB — TROPONIN I
TROPONIN I: 0.04 ng/mL — AB (ref <0.031)
TROPONIN I: 0.05 ng/mL — AB (ref <0.031)
Troponin I: 0.05 ng/mL — ABNORMAL HIGH (ref <0.031)
Troponin I: 0.05 ng/mL — ABNORMAL HIGH (ref <0.031)

## 2014-05-22 LAB — CBC WITH DIFFERENTIAL/PLATELET
Basophils Absolute: 0 10*3/uL (ref 0.0–0.1)
Basophils Relative: 0 % (ref 0–1)
Eosinophils Absolute: 0.1 10*3/uL (ref 0.0–0.7)
Eosinophils Relative: 2 % (ref 0–5)
HCT: 40.3 % (ref 36.0–46.0)
Hemoglobin: 13.2 g/dL (ref 12.0–15.0)
Lymphocytes Relative: 20 % (ref 12–46)
Lymphs Abs: 1.4 10*3/uL (ref 0.7–4.0)
MCH: 29.5 pg (ref 26.0–34.0)
MCHC: 32.8 g/dL (ref 30.0–36.0)
MCV: 90.2 fL (ref 78.0–100.0)
Monocytes Absolute: 0.7 10*3/uL (ref 0.1–1.0)
Monocytes Relative: 10 % (ref 3–12)
Neutro Abs: 4.9 10*3/uL (ref 1.7–7.7)
Neutrophils Relative %: 68 % (ref 43–77)
PLATELETS: 498 10*3/uL — AB (ref 150–400)
RBC: 4.47 MIL/uL (ref 3.87–5.11)
RDW: 14.6 % (ref 11.5–15.5)
WBC: 7.2 10*3/uL (ref 4.0–10.5)

## 2014-05-22 LAB — BASIC METABOLIC PANEL
Anion gap: 10 (ref 5–15)
BUN: 25 mg/dL — ABNORMAL HIGH (ref 6–20)
CALCIUM: 9.4 mg/dL (ref 8.9–10.3)
CO2: 24 mmol/L (ref 22–32)
Chloride: 100 mmol/L — ABNORMAL LOW (ref 101–111)
Creatinine, Ser: 1.73 mg/dL — ABNORMAL HIGH (ref 0.44–1.00)
GFR calc non Af Amer: 26 mL/min — ABNORMAL LOW (ref >60)
GFR, EST AFRICAN AMERICAN: 30 mL/min — AB (ref >60)
Glucose, Bld: 104 mg/dL — ABNORMAL HIGH (ref 70–99)
Potassium: 4 mmol/L (ref 3.5–5.1)
Sodium: 134 mmol/L — ABNORMAL LOW (ref 135–145)

## 2014-05-22 LAB — BRAIN NATRIURETIC PEPTIDE: B Natriuretic Peptide: 801.8 pg/mL — ABNORMAL HIGH (ref 0.0–100.0)

## 2014-05-22 LAB — TSH: TSH: 2.033 u[IU]/mL (ref 0.350–4.500)

## 2014-05-22 MED ORDER — PRAVASTATIN SODIUM 80 MG PO TABS
80.0000 mg | ORAL_TABLET | Freq: Every day | ORAL | Status: DC
  Administered 2014-05-22 – 2014-05-23 (×2): 80 mg via ORAL
  Filled 2014-05-22 (×2): qty 1

## 2014-05-22 MED ORDER — COENZYME Q-10 100 MG PO CAPS: 100.0000 mg | ORAL_CAPSULE | Freq: Every day | ORAL | Status: DC

## 2014-05-22 MED ORDER — ASPIRIN EC 81 MG PO TBEC
81.0000 mg | DELAYED_RELEASE_TABLET | Freq: Every day | ORAL | Status: DC
  Administered 2014-05-22 – 2014-05-23 (×2): 81 mg via ORAL
  Filled 2014-05-22 (×2): qty 1

## 2014-05-22 MED ORDER — FUROSEMIDE 10 MG/ML IJ SOLN
40.0000 mg | Freq: Once | INTRAMUSCULAR | Status: AC
  Administered 2014-05-22: 40 mg via INTRAVENOUS
  Filled 2014-05-22: qty 4

## 2014-05-22 MED ORDER — HEPARIN SODIUM (PORCINE) 5000 UNIT/ML IJ SOLN
5000.0000 [IU] | Freq: Three times a day (TID) | INTRAMUSCULAR | Status: DC
  Administered 2014-05-22 – 2014-05-23 (×3): 5000 [IU] via SUBCUTANEOUS
  Filled 2014-05-22 (×3): qty 1

## 2014-05-22 MED ORDER — FUROSEMIDE 10 MG/ML IJ SOLN: 40.0000 mg | Freq: Once | INTRAMUSCULAR | Status: DC

## 2014-05-22 MED ORDER — METOPROLOL TARTRATE 12.5 MG HALF TABLET
12.5000 mg | ORAL_TABLET | Freq: Every day | ORAL | Status: DC
  Administered 2014-05-22 – 2014-05-23 (×2): 12.5 mg via ORAL
  Filled 2014-05-22 (×2): qty 1

## 2014-05-22 MED ORDER — POTASSIUM CHLORIDE CRYS ER 10 MEQ PO TBCR
10.0000 meq | EXTENDED_RELEASE_TABLET | Freq: Every day | ORAL | Status: DC
  Administered 2014-05-22 – 2014-05-23 (×2): 10 meq via ORAL
  Filled 2014-05-22 (×2): qty 1

## 2014-05-22 MED ORDER — ISOSORBIDE MONONITRATE ER 30 MG PO TB24
30.0000 mg | ORAL_TABLET | Freq: Every day | ORAL | Status: DC
  Administered 2014-05-22 – 2014-05-23 (×2): 30 mg via ORAL
  Filled 2014-05-22 (×2): qty 1

## 2014-05-22 MED ORDER — AMLODIPINE BESYLATE 10 MG PO TABS
10.0000 mg | ORAL_TABLET | Freq: Every day | ORAL | Status: DC
  Administered 2014-05-22 – 2014-05-23 (×2): 10 mg via ORAL
  Filled 2014-05-22 (×2): qty 1

## 2014-05-22 MED ORDER — FUROSEMIDE 40 MG PO TABS
40.0000 mg | ORAL_TABLET | Freq: Every day | ORAL | Status: DC
  Administered 2014-05-23: 40 mg via ORAL
  Filled 2014-05-22: qty 1

## 2014-05-22 MED ORDER — NITROGLYCERIN 0.4 MG SL SUBL
0.4000 mg | SUBLINGUAL_TABLET | SUBLINGUAL | Status: DC | PRN
  Administered 2014-05-22 (×2): 0.4 mg via SUBLINGUAL
  Filled 2014-05-22: qty 1

## 2014-05-22 MED ORDER — ALBUTEROL SULFATE (2.5 MG/3ML) 0.083% IN NEBU: 3.0000 mL | INHALATION_SOLUTION | RESPIRATORY_TRACT | Status: DC | PRN

## 2014-05-22 MED ORDER — HYDRALAZINE HCL 25 MG PO TABS
25.0000 mg | ORAL_TABLET | Freq: Three times a day (TID) | ORAL | Status: DC
  Administered 2014-05-22 – 2014-05-23 (×4): 25 mg via ORAL
  Filled 2014-05-22 (×6): qty 1

## 2014-05-22 NOTE — ED Notes (Signed)
Patient transported to X-ray 

## 2014-05-22 NOTE — Progress Notes (Signed)
Received pt report from [place].

## 2014-05-22 NOTE — ED Provider Notes (Signed)
CSN: [id number]     Arrival date & time 05/22/14  0356 History   First MD Initiated Contact with Patient 05/22/14 [phone]     Chief Complaint  Patient presents with  . Chest Pain     (Consider location/radiation/quality/duration/timing/severity/associated sxs/prior Treatment) Patient is a 81 y.o. female presenting with chest pain. The history is provided by the patient.  Chest Pain She had onset about 8 PM of heavy feeling in her chest with associated dyspnea and since that her heart was racing. She did take one nitroglycerin with some relief. She went to sleep and woke up with a sense that she was smothering and ongoing problems with chest tightness and sense of heart racing. She called for an ambulance and was given aspirin and add additional nitroglycerin with partial relief. She currently rates her discomfort at 5/10. It was 10/10 when she woke up. There is mild nausea and she did have diaphoresis at home. She had been admitted to the hospital on month ago with similar complaints which were found to be due to heart failure. She does relate that she has been weighing herself daily and there's been no weight gain.  Past Medical History  Diagnosis Date  . CAD (coronary artery disease)     a. 07/2008 Inf STEMI->RCA BMS;  b. 09/2008 CABGx2: LIMA->LAD, RIMA->OM as Y from LIMA;  b. 06/2011 MV EF 31%, bsaal to dist ant infarct, inf/inflat infarct with mild to mod peri-infarct ischemia [facility]).  . Ischemic cardiomyopathy     a. 06/2011 Echo: EF 40%, inf/inflat AK and lat HK, aortic sclerosis w/ mild AI, Mild to mod MR (prev mod-sev), Gr 1 DD, mild TR, RVSP 29mmHg  . Systolic CHF, chronic     a. 06/2011 EF 40%  . Hypertension   . Hyperlipidemia   . Hiatal hernia     large  . Tobacco abuse   . Cerebrovascular disease   . COPD (chronic obstructive pulmonary disease)     presumed   . Arterial disease     peripheral arterial disease with claudication - bilateral SFA disease with ABI's  0.4-0.5 bilaterally   . Mitral regurgitation     a. prev Mod-Sev, read as mild to mod by echo 06/2011 [facility]).  . CKD (chronic kidney disease), stage IV     born with 1 kidney  . MI (myocardial infarction) 2010  . Thyroid nodule 10/2012    left dominant nodule cytology:non-neoplastic goiter.   . Cataracts, both eyes     not surgical  . Personal history of colonic polyps - adenomas 05/09/2013  . Anemia   . Systolic HF (heart failure)   . Hypertensive renal disease   . Hypertensive heart disease with congestive heart failure   . Aorta aneurysm   . LVH (left ventricular hypertrophy)   . Arteriosclerotic vascular disease   . Bronchiectasis   . Atherosclerosis of abdominal aorta   . Heart murmur   . Shortness of breath dyspnea    Past Surgical History  Procedure Laterality Date  . Laprotomy for bowel obstruction    . Coronary artery bypass graft  09/2008    aortic thoraic anuerysm resecton 2010.   Marland Kitchen Coronary stent placement  07/2008    BMS to RCA  . Flexible sigmoidoscopy N/A 05/09/2013    Procedure: FLEXIBLE SIGMOIDOSCOPY;  Surgeon: [name], MD;  Location: [place];  Service: Endoscopy;  Laterality: N/A;  with possible hemorrhoid banding use gastrosope unsdeated  . Appendectomy    .  Oophorectomy      one removed in 70's  . Hemorrhoid surgery    . Colonoscopy N/A 05/12/2013    Procedure: COLONOSCOPY;  Surgeon: [name], MD;  Location: [facility];  Service: Endoscopy;  Laterality: N/A;   Family History  Problem Relation Age of Onset  . Coronary artery disease Mother     extensive family history; several other family members.  . Heart disease Mother     CHF  . Coronary artery disease Brother   . Leukemia Sister   . Leukemia Sister   . Leukemia Father   . Colon cancer Neg Hx   . Pancreatic cancer Neg Hx   . Stomach cancer Neg Hx   . Rectal cancer Neg Hx   . Kidney cancer Brother   . Heart failure Mother   . Heart attack Sister   . Stroke Neg  Hx    History  Substance Use Topics  . Smoking status: Former Smoker -- 1.00 packs/day for 60 years    Types: Cigarettes    Quit date: 07/12/2008  . Smokeless tobacco: Never Used  . Alcohol Use: No   OB History    No data available     Review of Systems  Cardiovascular: Positive for chest pain.  All other systems reviewed and are negative.     Allergies  Review of patient's allergies indicates no known allergies.  Home Medications   Prior to Admission medications   Medication Sig Start Date End Date Taking? Authorizing Provider  albuterol (PROVENTIL HFA;VENTOLIN HFA) 108 (90 BASE) MCG/ACT inhaler Inhale 2 puffs into the lungs every 4 (four) hours as needed for wheezing or shortness of breath.     Historical Provider, MD  amLODipine (NORVASC) 10 MG tablet TAKE ONE TABLET BY MOUTH ONCE DAILY 04/26/14   Burnell Blanks, MD  aspirin EC 81 MG tablet Take 81 mg by mouth daily.    Historical Provider, MD  Coenzyme Q-10 100 MG capsule Take 100 mg by mouth daily.      Historical Provider, MD  furosemide (LASIX) 40 MG tablet Take 1 tablet (40 mg total) by mouth daily. 04/23/14   Brett Canales, PA-C  hydrALAZINE (APRESOLINE) 25 MG tablet Take 1 tablet (25 mg total) by mouth 3 (three) times daily. 05/09/14   Thayer Headings, MD  isosorbide mononitrate (IMDUR) 30 MG 24 hr tablet Take 1 tablet (30 mg total) by mouth daily. 05/09/14   Thayer Headings, MD  Melatonin 5 MG TABS Take 15 mg by mouth at bedtime as needed (sleep).    Historical Provider, MD  metoprolol tartrate (LOPRESSOR) 25 MG tablet Take 0.5 tablets (12.5 mg total) by mouth daily. 04/22/14   Brett Canales, PA-C  nitroGLYCERIN (NITROSTAT) 0.4 MG SL tablet Place 1 tablet (0.4 mg total) under the tongue every 5 (five) minutes as needed. Patient taking differently: Place 0.4 mg under the tongue every 5 (five) minutes as needed for chest pain.  04/13/14   Burnell Blanks, MD  potassium chloride SA (K-DUR,KLOR-CON) 10 MEQ tablet  Take 1 tablet (10 mEq total) by mouth daily. 04/22/14   Brett Canales, PA-C  pravastatin (PRAVACHOL) 80 MG tablet TAKE ONE TABLET BY MOUTH ONCE DAILY IN THE EVENING Patient taking differently: TAKE ONE TABLET BY MOUTH ONCE DAILY IN THE afternoon 07/21/13   Burnell Blanks, MD   Pulse 86  Temp(Src) 99.4 F (37.4 C) (Oral)  Resp 18  Ht 5\' 6"  (1.676 m)  Wt  135 lb (61.236 kg)  BMI 21.80 kg/m2  SpO2 90% Physical Exam  Nursing note and vitals reviewed.  81 year old female, resting comfortably and in no acute distress. Vital signs are normal. Oxygen saturation is 90%, which is hypoxic. Head is normocephalic and atraumatic. PERRLA, EOMI. Oropharynx is clear. Neck is nontender and supple without adenopathy or JVD. Back is nontender and there is no CVA tenderness. Lungs have bibasilar rales extending halfway up. Chest is nontender. Heart has regular rate and rhythm with 3/6 holosystolic murmur along the left sternal border. Abdomen is soft, flat, nontender without masses or hepatosplenomegaly and peristalsis is normoactive. Extremities have no cyanosis or edema, full range of motion is present. Skin is warm and dry without rash. Neurologic: Mental status is normal, cranial nerves are intact, there are no motor or sensory deficits.  ED Course  Procedures (including critical care time) Labs Review Results for orders placed or performed during the hospital encounter of 93/81/82  Basic metabolic panel  Result Value Ref Range   Sodium 134 (L) 135 - 145 mmol/L   Potassium 4.0 3.5 - 5.1 mmol/L   Chloride 100 (L) 101 - 111 mmol/L   CO2 24 22 - 32 mmol/L   Glucose, Bld 104 (H) 70 - 99 mg/dL   BUN 25 (H) 6 - 20 mg/dL   Creatinine, Ser 1.73 (H) 0.44 - 1.00 mg/dL   Calcium 9.4 8.9 - 10.3 mg/dL   GFR calc non Af Amer 26 (L) >60 mL/min   GFR calc Af Amer 30 (L) >60 mL/min   Anion gap 10 5 - 15  Troponin I  Result Value Ref Range   Troponin I 0.04 (H) <0.031 ng/mL  Brain natriuretic  peptide  Result Value Ref Range   B Natriuretic Peptide 801.8 (H) 0.0 - 100.0 pg/mL  CBC with Differential  Result Value Ref Range   WBC 7.2 4.0 - 10.5 K/uL   RBC 4.47 3.87 - 5.11 MIL/uL   Hemoglobin 13.2 12.0 - 15.0 g/dL   HCT 40.3 36.0 - 46.0 %   MCV 90.2 78.0 - 100.0 fL   MCH 29.5 26.0 - 34.0 pg   MCHC 32.8 30.0 - 36.0 g/dL   RDW 14.6 11.5 - 15.5 %   Platelets 498 (H) 150 - 400 K/uL   Neutrophils Relative % 68 43 - 77 %   Neutro Abs 4.9 1.7 - 7.7 K/uL   Lymphocytes Relative 20 12 - 46 %   Lymphs Abs 1.4 0.7 - 4.0 K/uL   Monocytes Relative 10 3 - 12 %   Monocytes Absolute 0.7 0.1 - 1.0 K/uL   Eosinophils Relative 2 0 - 5 %   Eosinophils Absolute 0.1 0.0 - 0.7 K/uL   Basophils Relative 0 0 - 1 %   Basophils Absolute 0.0 0.0 - 0.1 K/uL   Imaging Review Dg Chest 2 View  05/22/2014   CLINICAL DATA:  Shortness of breath off and on for 1 month. Chest pain. History of CHF.  EXAM: CHEST  2 VIEW  COMPARISON:  05/05/2014  FINDINGS: Postoperative changes in the mediastinum. Cardiac enlargement with mild pulmonary vascular congestion. Diffuse interstitial parenchymal pattern consistent with interstitial edema. Small bilateral pleural effusions. Congestive changes are improving since prior study. Prominence a right hilum consistent with prominent pulmonary vessels. Calcified and tortuous aorta. No pneumothorax. Emphysematous changes in the lungs.  IMPRESSION: Cardiac enlargement with pulmonary vascular congestion, interstitial edema, and small pleural effusions, improving since prior study. Emphysematous changes.   Electronically  Signed   By: Lucienne Capers M.D.   On: 05/22/2014 05:09     EKG Interpretation   Date/Time:  Tuesday May 22 2014 04:19:37 EDT Ventricular Rate:  85 PR Interval:  201 QRS Duration: 143 QT Interval:  416 QTC Calculation: 495 R Axis:   -49 Text Interpretation:  Sinus rhythm Left bundle branch block When compared  with ECG of 05/15/2014, No significant change was  found Confirmed by [facility]   MD, Christohper Dube ([id number]) on 05/22/2014 4:38:19 AM      MDM   Final diagnoses:  Chest pain, unspecified chest pain type  CHF exacerbation  Elevated troponin  Renal insufficiency    Chest pain and dyspnea. No acute ECG changes are seen. I suspect that this is an exacerbation of her known congestive heart failure. Review of old records shows a recent hospitalization and recent time echocardiogram showing an ejection fraction of 35-40%. She'll be given additional nitroglycerin as well as oxygen. Cardiac workup initiated.  Chest x-ray shows evidence of CHF and BNP is significantly elevated although both are somewhat improved over most recent tests. Troponin is come back minimally elevated which is also at the same level it had been previously. Renal insufficiency is slightly improved over baseline. She has had complete relief of symptoms with additional nitroglycerin. She is given a dose of furosemide. Case is discussed with Dr. Marin Comment [facility] who agrees to admit the patient.    Delora Fuel, MD 21/19/41 [id number]

## 2014-05-22 NOTE — H&P (Signed)
[facility] History and Physical                                                                                    [name], is a 81 y.o. female  MRN: [id number]   DOB - 08-Feb-1927  Admit Date - 05/22/2014  Outpatient Primary MD for the patient is Leonard Downing, MD  Pulmonologist [place] Referring ED MD Roxanne Mins  With History of -  Past Medical History  Diagnosis Date  . CAD (coronary artery disease)     a. 07/2008 Inf STEMI->RCA BMS;  b. 09/2008 CABGx2: LIMA->LAD, RIMA->OM as Y from [facility];  b. 06/2011 MV EF 31%, bsaal to dist ant infarct, inf/inflat infarct with mild to mod peri-infarct ischemia [facility]).  . Ischemic cardiomyopathy     a. 06/2011 Echo: EF 40%, inf/inflat AK and lat HK, aortic sclerosis w/ mild AI, Mild to mod MR (prev mod-sev), Gr 1 DD, mild TR, RVSP 103mmHg  . Systolic CHF, chronic     a. 06/2011 EF 40%  . Hypertension   . Hyperlipidemia   . Hiatal hernia     large  . Tobacco abuse   . Cerebrovascular disease   . COPD (chronic obstructive pulmonary disease)     presumed   . Arterial disease     peripheral arterial disease with claudication - bilateral SFA disease with ABI's 0.4-0.5 bilaterally   . Mitral regurgitation     a. prev Mod-Sev, read as mild to mod by echo 06/2011 [facility]).  . CKD (chronic kidney disease), stage IV     born with 1 kidney  . MI (myocardial infarction) 2010  . Thyroid nodule 10/2012    left dominant nodule cytology:non-neoplastic goiter.   . Cataracts, both eyes     not surgical  . Personal history of colonic polyps - adenomas 05/09/2013  . Anemia   . Systolic HF (heart failure)   . Hypertensive renal disease   . Hypertensive heart disease with congestive heart failure   . Aorta aneurysm   . LVH (left ventricular hypertrophy)   . Arteriosclerotic vascular disease   . Bronchiectasis   . Atherosclerosis of abdominal aorta   . Heart murmur   . Shortness of breath dyspnea       Past Surgical  History  Procedure Laterality Date  . Laprotomy for bowel obstruction    . Coronary artery bypass graft  09/2008    aortic thoraic anuerysm resecton 2010.   Marland Kitchen Coronary stent placement  07/2008    BMS to RCA  . Flexible sigmoidoscopy N/A 05/09/2013    Procedure: FLEXIBLE SIGMOIDOSCOPY;  Surgeon: [name], MD;  Location: [facility];  Service: Endoscopy;  Laterality: N/A;  with possible hemorrhoid banding use gastrosope unsdeated  . Appendectomy    . Oophorectomy      one removed in 70's  . Hemorrhoid surgery    . Colonoscopy N/A 05/12/2013    Procedure: COLONOSCOPY;  Surgeon: [name], MD;  Location: [facility];  Service: Endoscopy;  Laterality: N/A;    in for   Chief Complaint  Patient presents with  . Chest Pain  HPI  [name]  is a 81 y.o. female, lives alone with pmh of CAD, ICM, chronic systolic HF, HTN, HLD, tobacco abuse, COPD, CKD presented to the ED this am with shortness of breath. Pt states symptoms began approximately 8pm on evening prior with shortness of breath and chest pressure. At that time, she took 1 sublingual nitro and used her inhaler with relief. She awoke at 2am with shortness of breath, diaphoresis and chest tightness prompting her to come to ED. Pt has had 2 recent admission 4/7 through 4/10 and again on 4/24 both for CHF exacerbation. She states she has been doing well since discharge, taking meds, complying with low salt diet and weighing each day. Her 'dry' weight appears to be around 135lbs which she is today.   Upon admission evaluation, pt is feeling much better after receiving 40mg  IV lasix in ED. She has diuresed ~662mL since arrival. Denies any chest pain at this time, but again did have discomfort this am with diaphoresis. Denies recent fever, cough, abdominal pain, n/v/d. Admit pt for further evaluation and treatment.   Review of Systems   In addition to the HPI above,  No Fever-chills, No Headache, No changes with Vision or  hearing, No problems swallowing food or Liquids, No Cough , No Abdominal pain, No Nausea or Vomiting, Bowel movements are regular, No Blood in stool or Urine, No dysuria, No new skin rashes or bruises, No new joints pains-aches,  No new weakness, tingling, numbness in any extremity, No recent weight gain or loss, A full 10 point Review of Systems was done, except as stated above, all other Review of Systems were negative.  Social History History  Substance Use Topics  . Smoking status: Former Smoker -- 1.00 packs/day for 60 years    Types: Cigarettes    Quit date: 07/12/2008  . Smokeless tobacco: Never Used  . Alcohol Use: No  Pt lives alone. Daughter offers good support  Family History Family History  Problem Relation Age of Onset  . Coronary artery disease Mother     extensive family history; several other family members.  . Heart disease Mother     CHF  . Coronary artery disease Brother   . Leukemia Sister   . Leukemia Sister   . Leukemia Father   . Colon cancer Neg Hx   . Pancreatic cancer Neg Hx   . Stomach cancer Neg Hx   . Rectal cancer Neg Hx   . Kidney cancer Brother   . Heart failure Mother   . Heart attack Sister   . Stroke Neg Hx     Prior to Admission medications   Medication Sig Start Date End Date Taking? Authorizing Provider  albuterol (PROVENTIL HFA;VENTOLIN HFA) 108 (90 BASE) MCG/ACT inhaler Inhale 2 puffs into the lungs every 4 (four) hours as needed for wheezing or shortness of breath.    Yes Historical Provider, MD  amLODipine (NORVASC) 10 MG tablet TAKE ONE TABLET BY MOUTH ONCE DAILY 04/26/14  Yes Burnell Blanks, MD  aspirin EC 81 MG tablet Take 81 mg by mouth daily.   Yes Historical Provider, MD  Coenzyme Q-10 100 MG capsule Take 100 mg by mouth daily.     Yes Historical Provider, MD  furosemide (LASIX) 40 MG tablet Take 1 tablet (40 mg total) by mouth daily. 04/23/14  Yes Brett Canales, PA-C  hydrALAZINE (APRESOLINE) 25 MG tablet Take 1  tablet (25 mg total) by mouth 3 (three) times daily. 05/09/14  Yes Thayer Headings, MD  isosorbide mononitrate (IMDUR) 30 MG 24 hr tablet Take 1 tablet (30 mg total) by mouth daily. 05/09/14  Yes Thayer Headings, MD  metoprolol tartrate (LOPRESSOR) 25 MG tablet Take 0.5 tablets (12.5 mg total) by mouth daily. 04/22/14  Yes Brett Canales, PA-C  nitroGLYCERIN (NITROSTAT) 0.4 MG SL tablet Place 1 tablet (0.4 mg total) under the tongue every 5 (five) minutes as needed. Patient taking differently: Place 0.4 mg under the tongue every 5 (five) minutes as needed for chest pain.  04/13/14  Yes Burnell Blanks, MD  potassium chloride SA (K-DUR,KLOR-CON) 10 MEQ tablet Take 1 tablet (10 mEq total) by mouth daily. 04/22/14  Yes Brett Canales, PA-C  pravastatin (PRAVACHOL) 80 MG tablet TAKE ONE TABLET BY MOUTH ONCE DAILY IN THE EVENING Patient taking differently: TAKE ONE TABLET BY MOUTH ONCE DAILY IN THE afternoon 07/21/13  Yes Burnell Blanks, MD    No Known Allergies  Physical Exam  Vitals  Blood pressure 132/64, pulse 94, temperature 98.3 F (36.8 C), temperature source Oral, resp. rate 16, height 5\' 6"  (1.676 m), weight 61.644 kg (135 lb 14.4 oz), SpO2 98 %.   General:  Up walking around room in NAD   Psych:  Normal affect and insight, Not Suicidal or Homicidal, Awake Alert, Oriented X 3.  Neuro:   No F.N deficits, ALL C.Nerves Intact, Strength 5/5 all 4 extremities, Sensation intact all 4 extremities.  ENT:  Ears and Eyes appear Normal, Conjunctivae clear, PER. Moist oral mucosa without erythema or exudates.  Neck:  Supple, No lymphadenopathy appreciated  Respiratory:  Symmetrical chest wall movement, mild bibasilar crackles. No increased wob.  Cardiac:  RRR with SEM, no bruit, no JVD, no LE edema    Abdomen:  Positive bowel sounds, Soft, Non tender, Non distended,  No masses appreciated  Skin:  No Cyanosis, Normal Skin Turgor, No Skin Rash or Bruise.  Extremities:  Able to move  all 4. 5/5 strength in each,  no effusions.  Data Review  CBC  Recent Labs Lab 05/22/14 0409  WBC 7.2  HGB 13.2  HCT 40.3  PLT 498*  MCV 90.2  MCH 29.5  MCHC 32.8  RDW 14.6  LYMPHSABS 1.4  MONOABS 0.7  EOSABS 0.1  BASOSABS 0.0    Chemistries   Recent Labs Lab 05/22/14 0409  NA 134*  K 4.0  CL 100*  CO2 24  GLUCOSE 104*  BUN 25*  CREATININE 1.73*  CALCIUM 9.4    estimated creatinine clearance is 22.3 mL/min (by C-G formula based on Cr of 1.73).  No results for input(s): TSH, T4TOTAL, T3FREE, THYROIDAB in the last 72 hours.  Invalid input(s): FREET3  Coagulation profile No results for input(s): INR, PROTIME in the last 168 hours.  No results for input(s): DDIMER in the last 72 hours.  Cardiac Enzymes  Recent Labs Lab 05/22/14 0409  TROPONINI 0.04*    Invalid input(s): POCBNP  Urinalysis    Component Value Date/Time   COLORURINE YELLOW 04/19/2014 0858   APPEARANCEUR CLEAR 04/19/2014 0858   LABSPEC 1.008 04/19/2014 0858   PHURINE 5.5 04/19/2014 0858   GLUCOSEU NEGATIVE 04/19/2014 0858   GLUCOSEU NEGATIVE 10/25/2009 0955   HGBUR NEGATIVE 04/19/2014 0858   BILIRUBINUR NEGATIVE 04/19/2014 0858   KETONESUR NEGATIVE 04/19/2014 0858   PROTEINUR NEGATIVE 04/19/2014 0858   UROBILINOGEN 0.2 04/19/2014 0858   NITRITE NEGATIVE 04/19/2014 0858   LEUKOCYTESUR SMALL* 04/19/2014 0858    Imaging results:  Dg Chest 2 View  05/22/2014   CLINICAL DATA:  Shortness of breath off and on for 1 month. Chest pain. History of CHF.  EXAM: CHEST  2 VIEW  COMPARISON:  05/05/2014  FINDINGS: Postoperative changes in the mediastinum. Cardiac enlargement with mild pulmonary vascular congestion. Diffuse interstitial parenchymal pattern consistent with interstitial edema. Small bilateral pleural effusions. Congestive changes are improving since prior study. Prominence a right hilum consistent with prominent pulmonary vessels. Calcified and tortuous aorta. No pneumothorax.  Emphysematous changes in the lungs.  IMPRESSION: Cardiac enlargement with pulmonary vascular congestion, interstitial edema, and small pleural effusions, improving since prior study. Emphysematous changes.   Electronically Signed   By: Lucienne Capers M.D.   On: 05/22/2014 05:09   Nm Pulmonary Perf And Vent  05/06/2014   CLINICAL DATA:  Dyspnea/short of breath. Difficulty breathing. Pulmonary embolism. Positive D-dimer. Hypoxia.  EXAM: NUCLEAR MEDICINE VENTILATION - PERFUSION LUNG SCAN  TECHNIQUE: Ventilation images were obtained in multiple projections using inhaled aerosol technetium 99 M DTPA. Perfusion images were obtained in multiple projections after intravenous injection of Tc-35m MAA.  RADIOPHARMACEUTICALS:  40.0 mCi Tc-16m DTPA aerosol and 6.0 mCi Tc-59m MAA  COMPARISON:  Chest radiograph 05/05/2014.  FINDINGS: Ventilation: Heterogeneous ventilation study with clumping of radiotracer and a mottled appearance of the lungs. Some of this is probably due to CHF with airspace disease identified on yesterday's chest radiograph.  Perfusion: No wedge shaped peripheral perfusion defects to suggest acute pulmonary embolism.  IMPRESSION: Low probability of pulmonary embolus.   Electronically Signed   By: Dereck Ligas M.D.   On: 05/06/2014 13:12   Dg Chest Port 1 View  05/05/2014   CLINICAL DATA:  Short of breath  EXAM: PORTABLE CHEST - 1 VIEW  COMPARISON:  05/03/2014  FINDINGS: Cardiac enlargement with congestive heart failure. Vascular congestion and mild edema. Small bilateral effusions. These findings have progressed since the prior study. There is bibasilar atelectasis.  IMPRESSION: Progressive congestive heart failure with mild edema and bilateral effusions and bibasilar atelectasis.   Electronically Signed   By: Franchot Gallo M.D.   On: 05/05/2014 19:42   Dg Chest Port 1 View  05/03/2014   CLINICAL DATA:  Shortness of breath with chest pressure/pain.  COPD.  EXAM: PORTABLE CHEST - 1 VIEW   COMPARISON:  04/22/2014  FINDINGS: Patient slightly rotated to the right. Sternotomy wires unchanged. Lungs are adequately inflated with worsening mild right basilar opacification with blunting of the right costophrenic angle as findings may be due to atelectasis versus infection with small effusion. There is evidence of patient's large hiatal hernia containing a air which projects over the retrocardiac region and medial right base. Mild stable cardiomegaly. Mild calcified plaque over the thoracic aorta. Remainder of the exam is unchanged.  IMPRESSION: Worsening opacification in the right base likely atelectasis versus infection with small right pleural effusion.  Known large hiatal hernia containing air in the retrocardiac region and projecting to the medial right base.  Stable cardiomegaly.   Electronically Signed   By: Marin Olp M.D.   On: 05/03/2014 13:45    My personal review of EKG: NSR, No ST changes noted.   Assessment & Plan  Active Problems:   Dyslipidemia   Essential hypertension   CAD- RCA BMS July 2010, CABG x 2 and AO root Sept 2010   Chronic combined systolic and diastolic heart failure, NYHA class 2   Bronchiectasis without acute exacerbation   CKD (chronic kidney disease), stage IV   CHF exacerbation  Acute on chronic systolic CHF/ICM -she reports compliance with medication and diet so unclear what precipitated exacerbation. Weight is stable at 135lbs.  Last echo performed 04/20/2014 showed EF 30-35% with diffuse hypokinesis. Will continue gentle IV diuresis for on e more dose today, but can likely resume po lasix in am. I/Os  Elevated troponin, mild Probable demand ischemia related to above. Will cycle cardiac enzymes.  HTN Stable. Continue amlodipine, BB, hydralazine  Hx CAD Cycle cardiac enzymes. Continue home meds  CKD, stage IV Stable. Baseline creatinine appears to be around 1.7-1.9. Monitor with diuresis  DVT Prophylaxis SQ heparin  Family Communication     Daughter at bedside on exam  Code Status:   Full code  Condition:   stable  Time spent in minutes : [place], NP on 05/22/2014 at 7:44 AM Between 7am to 7pm - Pager - [phone] After 7pm go to [email or web address] - password [email or web address]  And look for the night coverage person covering me after hours  [facility]

## 2014-05-22 NOTE — ED Notes (Signed)
Pt. arrived with EMS from home reports central chest tightness / palpitations with SOB and diaphoresis onset this morning , denies nausea. Pt. received 4 baby ASA  And 1 NTG sl by EMS with slight relief.

## 2014-05-23 DIAGNOSIS — I5042 Chronic combined systolic (congestive) and diastolic (congestive) heart failure: Secondary | ICD-10-CM

## 2014-05-23 LAB — BASIC METABOLIC PANEL
Anion gap: 11 (ref 5–15)
BUN: 23 mg/dL — ABNORMAL HIGH (ref 6–20)
CALCIUM: 9 mg/dL (ref 8.9–10.3)
CO2: 30 mmol/L (ref 22–32)
Chloride: 99 mmol/L — ABNORMAL LOW (ref 101–111)
Creatinine, Ser: 1.8 mg/dL — ABNORMAL HIGH (ref 0.44–1.00)
GFR calc Af Amer: 28 mL/min — ABNORMAL LOW (ref >60)
GFR calc non Af Amer: 25 mL/min — ABNORMAL LOW (ref >60)
Glucose, Bld: 83 mg/dL (ref 70–99)
Potassium: 4 mmol/L (ref 3.5–5.1)
Sodium: 140 mmol/L (ref 135–145)

## 2014-05-23 LAB — CBC
HEMATOCRIT: 40.2 % (ref 36.0–46.0)
HEMOGLOBIN: 12.8 g/dL (ref 12.0–15.0)
MCH: 28.8 pg (ref 26.0–34.0)
MCHC: 31.8 g/dL (ref 30.0–36.0)
MCV: 90.5 fL (ref 78.0–100.0)
Platelets: 463 10*3/uL — ABNORMAL HIGH (ref 150–400)
RBC: 4.44 MIL/uL (ref 3.87–5.11)
RDW: 14.6 % (ref 11.5–15.5)
WBC: 5.2 10*3/uL (ref 4.0–10.5)

## 2014-05-23 NOTE — Progress Notes (Signed)
Pt discharged home. Discharge instructions completed with patient and daughter.

## 2014-05-23 NOTE — Discharge Summary (Signed)
Physician Discharge Summary  [name] QJJ:[id number] DOB: 1927-05-26 DOA: 05/22/2014  PCP: Leonard Downing, MD  Admit date: 05/22/2014 Discharge date: 05/23/2014  Time spent: > 35 minutes  Recommendations for Outpatient Follow-up:  1. Monitor patient's Serum creatinine  Discharge Diagnoses:  Active Problems:   Dyslipidemia   Essential hypertension   CAD- RCA BMS July 2010, CABG x 2 and AO root Sept 2010   Chronic combined systolic and diastolic heart failure, NYHA class 2   Bronchiectasis without acute exacerbation   CKD (chronic kidney disease), stage IV   CHF exacerbation   Acute systolic congestive heart failure   Discharge Condition: stable  Diet recommendation: heart healthy  Filed Weights   05/22/14 0416 05/22/14 0723 05/23/14 0608  Weight: 61.236 kg (135 lb) 61.644 kg (135 lb 14.4 oz) 61.283 kg (135 lb 1.7 oz)    History of present illness:  From original HPI: [name] is a 81 y.o. female, lives alone with pmh of CAD, ICM, chronic systolic HF, HTN, HLD, tobacco abuse, COPD, CKD presented to the ED this am with shortness of breath. Pt states symptoms began approximately 8pm on evening prior with shortness of breath and chest pressure. At that time, she took 1 sublingual nitro and used her inhaler with relief. She awoke at 2am with shortness of breath, diaphoresis and chest tightness prompting her to come to ED. Pt has had 2 recent admission 4/7 through 4/10 and again on 4/24 both for CHF exacerbation.Upon admission evaluation, pt is feeling much better after receiving 40mg  IV lasix in ED. She has diuresed ~618mL since arrival. Denies any chest pain at this time, but again did have discomfort this am with diaphoresis  Hospital Course:  Acute on chronic systolic HF: Of note patient also has grade 2 DD - resolved after IV lasix administration - Patient did not report any chest pain and EKG unchanged when compared to last. - Plan will be to discharge  patient with follow-up with cardiologist in 7-10 days. Order placed to schedule appointment with cardiology. - Otherwise resume home medication regimen - Last echocardiogram EF of 30-35% this was on 04/20/2014, on this report there was mention of global hypokinesis with akinesis of the basal inferolateral wall overall moderate to severe left ventricular dysfunction  CKD - S creatinine at baseline  Elevated Troponin - in context of CKD in patient who presented with acute CHF exacerbation - Most likely demand ischemia. No complaints of chest pain reported to me.  Procedures:   None  Consultations:  None  Discharge Exam: Filed Vitals:   05/23/14 1047  BP: 131/62  Pulse: 81  Temp: 98.4 F (36.9 C)  Resp: 18    General: Pt in nad, alert and awake Cardiovascular: rrr, no rubs Respiratory: cta bl, no wheezes, no rhales  Discharge Instructions   Discharge Instructions    (HEART FAILURE PATIENTS) Call MD:  Anytime you have any of the following symptoms: 1) 3 pound weight gain in 24 hours or 5 pounds in 1 week 2) shortness of breath, with or without a dry hacking cough 3) swelling in the hands, feet or stomach 4) if you have to sleep on extra pillows at night in order to breathe.    Complete by:  As directed      Call MD for:  difficulty breathing, headache or visual disturbances    Complete by:  As directed      Call MD for:  temperature >100.4    Complete by:  As  directed      Diet - low sodium heart healthy    Complete by:  As directed      Discharge instructions    Complete by:  As directed   Please follow up with your Cardiologist in 7-10 days or sooner should any new concerns arise.     Increase activity slowly    Complete by:  As directed           Current Discharge Medication List    CONTINUE these medications which have NOT CHANGED   Details  albuterol (PROVENTIL HFA;VENTOLIN HFA) 108 (90 BASE) MCG/ACT inhaler Inhale 2 puffs into the lungs every 4 (four) hours  as needed for wheezing or shortness of breath.     amLODipine (NORVASC) 10 MG tablet TAKE ONE TABLET BY MOUTH ONCE DAILY Qty: 30 tablet, Refills: 5    aspirin EC 81 MG tablet Take 81 mg by mouth daily.    Coenzyme Q-10 100 MG capsule Take 100 mg by mouth daily.      furosemide (LASIX) 40 MG tablet Take 1 tablet (40 mg total) by mouth daily. Qty: 30 tablet, Refills: 6    hydrALAZINE (APRESOLINE) 25 MG tablet Take 1 tablet (25 mg total) by mouth 3 (three) times daily. Qty: 90 tablet, Refills: 3    isosorbide mononitrate (IMDUR) 30 MG 24 hr tablet Take 1 tablet (30 mg total) by mouth daily. Qty: 30 tablet, Refills: 3    metoprolol tartrate (LOPRESSOR) 25 MG tablet Take 0.5 tablets (12.5 mg total) by mouth daily. Qty: 60 tablet, Refills: 5    nitroGLYCERIN (NITROSTAT) 0.4 MG SL tablet Place 1 tablet (0.4 mg total) under the tongue every 5 (five) minutes as needed. Qty: 25 tablet, Refills: 6    potassium chloride SA (K-DUR,KLOR-CON) 10 MEQ tablet Take 1 tablet (10 mEq total) by mouth daily. Qty: 30 tablet, Refills: 6    pravastatin (PRAVACHOL) 80 MG tablet TAKE ONE TABLET BY MOUTH ONCE DAILY IN THE EVENING Qty: 30 tablet, Refills: 11   Associated Diagnoses: Pure hypercholesterolemia       No Known Allergies Follow-up Information    Follow up with [facility] R, NP On 05/28/2014.   Specialties:  Cardiology, Radiology   Why:  9:00am confirmed by Fernande Boyden information:   [address] [place] [address] [phone]        The results of significant diagnostics from this hospitalization (including imaging, microbiology, ancillary and laboratory) are listed below for reference.    Significant Diagnostic Studies: Dg Chest 2 View  05/22/2014   CLINICAL DATA:  Shortness of breath off and on for 1 month. Chest pain. History of CHF.  EXAM: CHEST  2 VIEW  COMPARISON:  05/05/2014  FINDINGS: Postoperative changes in the mediastinum. Cardiac enlargement with  mild pulmonary vascular congestion. Diffuse interstitial parenchymal pattern consistent with interstitial edema. Small bilateral pleural effusions. Congestive changes are improving since prior study. Prominence a right hilum consistent with prominent pulmonary vessels. Calcified and tortuous aorta. No pneumothorax. Emphysematous changes in the lungs.  IMPRESSION: Cardiac enlargement with pulmonary vascular congestion, interstitial edema, and small pleural effusions, improving since prior study. Emphysematous changes.   Electronically Signed   By: Lucienne Capers M.D.   On: 05/22/2014 05:09   Nm Pulmonary Perf And Vent  05/06/2014   CLINICAL DATA:  Dyspnea/short of breath. Difficulty breathing. Pulmonary embolism. Positive D-dimer. Hypoxia.  EXAM: NUCLEAR MEDICINE VENTILATION - PERFUSION LUNG SCAN  TECHNIQUE: Ventilation images were obtained in  multiple projections using inhaled aerosol technetium 99 M DTPA. Perfusion images were obtained in multiple projections after intravenous injection of Tc-2m MAA.  RADIOPHARMACEUTICALS:  40.0 mCi Tc-38m DTPA aerosol and 6.0 mCi Tc-68m MAA  COMPARISON:  Chest radiograph 05/05/2014.  FINDINGS: Ventilation: Heterogeneous ventilation study with clumping of radiotracer and a mottled appearance of the lungs. Some of this is probably due to CHF with airspace disease identified on yesterday's chest radiograph.  Perfusion: No wedge shaped peripheral perfusion defects to suggest acute pulmonary embolism.  IMPRESSION: Low probability of pulmonary embolus.   Electronically Signed   By: Dereck Ligas M.D.   On: 05/06/2014 13:12   Dg Chest Port 1 View  05/05/2014   CLINICAL DATA:  Short of breath  EXAM: PORTABLE CHEST - 1 VIEW  COMPARISON:  05/03/2014  FINDINGS: Cardiac enlargement with congestive heart failure. Vascular congestion and mild edema. Small bilateral effusions. These findings have progressed since the prior study. There is bibasilar atelectasis.  IMPRESSION:  Progressive congestive heart failure with mild edema and bilateral effusions and bibasilar atelectasis.   Electronically Signed   By: Franchot Gallo M.D.   On: 05/05/2014 19:42   Dg Chest Port 1 View  05/03/2014   CLINICAL DATA:  Shortness of breath with chest pressure/pain.  COPD.  EXAM: PORTABLE CHEST - 1 VIEW  COMPARISON:  04/22/2014  FINDINGS: Patient slightly rotated to the right. Sternotomy wires unchanged. Lungs are adequately inflated with worsening mild right basilar opacification with blunting of the right costophrenic angle as findings may be due to atelectasis versus infection with small effusion. There is evidence of patient's large hiatal hernia containing a air which projects over the retrocardiac region and medial right base. Mild stable cardiomegaly. Mild calcified plaque over the thoracic aorta. Remainder of the exam is unchanged.  IMPRESSION: Worsening opacification in the right base likely atelectasis versus infection with small right pleural effusion.  Known large hiatal hernia containing air in the retrocardiac region and projecting to the medial right base.  Stable cardiomegaly.   Electronically Signed   By: Marin Olp M.D.   On: 05/03/2014 13:45    Microbiology: No results found for this or any previous visit (from the past 240 hour(s)).   Labs: Basic Metabolic Panel:  Recent Labs Lab 05/22/14 0409 05/23/14 0549  NA 134* 140  K 4.0 4.0  CL 100* 99*  CO2 24 30  GLUCOSE 104* 83  BUN 25* 23*  CREATININE 1.73* 1.80*  CALCIUM 9.4 9.0   Liver Function Tests: No results for input(s): AST, ALT, ALKPHOS, BILITOT, PROT, ALBUMIN in the last 168 hours. No results for input(s): LIPASE, AMYLASE in the last 168 hours. No results for input(s): AMMONIA in the last 168 hours. CBC:  Recent Labs Lab 05/22/14 0409 05/23/14 0549  WBC 7.2 5.2  NEUTROABS 4.9  --   HGB 13.2 12.8  HCT 40.3 40.2  MCV 90.2 90.5  PLT 498* 463*   Cardiac Enzymes:  Recent Labs Lab  05/22/14 0409 05/22/14 0955 05/22/14 1420 05/22/14 1952  TROPONINI 0.04* 0.05* 0.05* 0.05*   BNP: BNP (last 3 results)  Recent Labs  05/03/14 1200 05/05/14 1935 05/22/14 0409  BNP 682.5* 949.1* 801.8*    ProBNP (last 3 results) No results for input(s): PROBNP in the last 8760 hours.  CBG: No results for input(s): GLUCAP in the last 168 hours.     Signed:  Velvet Bathe  [facility] 05/23/2014, 11:18 AM

## 2014-05-23 NOTE — Evaluation (Signed)
Physical Therapy Evaluation Patient Details Name: [name] MRN: [id number] DOB: Nov 23, 1927 Today's Date: 05/23/2014   History of Present Illness  [name] is a 81 y.o. female, lives alone with pmh of CAD, ICM, chronic systolic HF, HTN, HLD, tobacco abuse, COPD, CKD presented to the ED with shortness of breath. Responded well to IV lasix  Clinical Impression  Patient evaluated by Physical Therapy with no further acute PT needs identified. All education has been completed and the patient has no further questions.  See below for any follow-up Physical Therapy or equipment needs. PT is signing off. Thank you for this referral.  Ambulated on Room Air and O2 sats ranged 91% to 96%; HR ranged 80-107     Follow Up Recommendations No PT follow up;Other (comment) (Consider HHRN fo rchronic disease management)    Equipment Recommendations  None recommended by PT    Recommendations for Other Services       Precautions / Restrictions        Mobility  Bed Mobility Overal bed mobility: Independent                Transfers Overall transfer level: Independent Equipment used: None                Ambulation/Gait Ambulation/Gait assistance: Supervision;Modified independent (Device/Increase time) Ambulation Distance (Feet): 220 Feet Assistive device: None (and pushing Dinamap) Gait Pattern/deviations: Step-through pattern;WFL(Within Functional Limits)   Gait velocity interpretation: at or above normal speed for age/gender General Gait Details: Cues to self-monitor for activity tolerance  Stairs Stairs: Yes Stairs assistance: Modified independent (Device/Increase time) Stair Management: One rail Right Number of Stairs: 4 General stair comments: Managing well  Wheelchair Mobility    Modified [name] (Stroke Patients Only)       Balance Overall balance assessment: No apparent balance deficits (not formally assessed)                                           Pertinent Vitals/Pain Pain Assessment: No/denies pain    Home Living Family/patient expects to be discharged to:: Private residence Living Arrangements: Alone Available Help at Discharge: Family;Available PRN/intermittently Type of Home: Mobile home Home Access: Stairs to enter Entrance Stairs-Rails: Right Entrance Stairs-Number of Steps: 2 Home Layout: One level Home Equipment: Cane - single point;Walker - 2 wheels;Wheelchair - manual      Prior Function Level of Independence: Independent         Comments: pt does not use DME and drives to the store and church but usually has dgtr with her for MD appointments and other outings. Pt reports no falls in the last 5 years.     Hand Dominance        Extremity/Trunk Assessment   Upper Extremity Assessment: Overall WFL for tasks assessed           Lower Extremity Assessment: Overall WFL for tasks assessed      Cervical / Trunk Assessment: Normal  Communication   Communication: No difficulties  Cognition Arousal/Alertness: Awake/alert Behavior During Therapy: WFL for tasks assessed/performed Overall Cognitive Status: Within Functional Limits for tasks assessed                      General Comments General comments (skin integrity, edema, etc.): Ambulated on Room Air and O2 sats ranged 91% to 96%; HR ranged 80-107    Exercises  Assessment/Plan    PT Assessment Patent does not need any further PT services  PT Diagnosis Generalized weakness   PT Problem List    PT Treatment Interventions     PT Goals (Current goals can be found in the Care Plan section) Acute Rehab PT Goals Patient Stated Goal: Doesn't want to have to be readmitted PT Goal Formulation: All assessment and education complete, DC therapy    Frequency     Barriers to discharge        Co-evaluation               End of Session   Activity Tolerance: Patient tolerated treatment well Patient  left: in chair;with call bell/phone within reach;with family/visitor present Nurse Communication: Mobility status         Time: 0910-0930 PT Time Calculation (min) (ACUTE ONLY): 20 min   Charges:   PT Evaluation $Initial PT Evaluation Tier I: 1 Procedure     PT G CodesQuin Hoop 05/23/2014, 9:51 AM  Roney Marion, [place] Pager [phone] Office [phone]

## 2014-05-23 NOTE — Consult Note (Signed)
   [facility] CM Inpatient Consult   05/23/2014  MARYLIN LATHON 06-23-1927 [id number]  Referral received.  Patient evaluated for [place] Management services. Cross check of data base.  Patient is not currently eligible for [facility] Care Management services because of not having a [facility] affiliated delegation of the Medicare plan. Will notify inpatient RNCM of findings.  For any additional questions or new referrals please contact: Natividad Brood, [facility] Liaison  [phone] business mobile phone

## 2014-05-28 ENCOUNTER — Encounter: Payer: Self-pay | Admitting: Cardiology

## 2014-05-28 ENCOUNTER — Ambulatory Visit (INDEPENDENT_AMBULATORY_CARE_PROVIDER_SITE_OTHER): Payer: Medicare Other | Admitting: Cardiology

## 2014-05-28 ENCOUNTER — Ambulatory Visit (INDEPENDENT_AMBULATORY_CARE_PROVIDER_SITE_OTHER): Payer: Medicare Other

## 2014-05-28 VITALS — BP 134/64 | HR 72 | Ht 66.5 in | Wt 134.6 lb

## 2014-05-28 DIAGNOSIS — R0602 Shortness of breath: Secondary | ICD-10-CM

## 2014-05-28 DIAGNOSIS — R000 Tachycardia, unspecified: Secondary | ICD-10-CM

## 2014-05-28 DIAGNOSIS — I5040 Unspecified combined systolic (congestive) and diastolic (congestive) heart failure: Secondary | ICD-10-CM

## 2014-05-28 DIAGNOSIS — N184 Chronic kidney disease, stage 4 (severe): Secondary | ICD-10-CM | POA: Diagnosis not present

## 2014-05-28 DIAGNOSIS — I2510 Atherosclerotic heart disease of native coronary artery without angina pectoris: Secondary | ICD-10-CM

## 2014-05-28 DIAGNOSIS — I255 Ischemic cardiomyopathy: Secondary | ICD-10-CM | POA: Diagnosis not present

## 2014-05-28 DIAGNOSIS — I340 Nonrheumatic mitral (valve) insufficiency: Secondary | ICD-10-CM

## 2014-05-28 DIAGNOSIS — I10 Essential (primary) hypertension: Secondary | ICD-10-CM

## 2014-05-28 DIAGNOSIS — I739 Peripheral vascular disease, unspecified: Secondary | ICD-10-CM

## 2014-05-28 NOTE — Patient Instructions (Addendum)
Weigh daily Call [phone] if weight climbs more than 3 pounds in a day or 5 pounds in a week. No salt to very little salt in your diet.  No more than 2000 mg in a day. Call if increased shortness of breath or increased swelling.   Your physician has recommended that you wear a 30 day event monitor. Event monitors are medical devices that record the heart's electrical activity. Doctors most often [place] these monitors to diagnose arrhythmias. Arrhythmias are problems with the speed or rhythm of the heartbeat. The monitor is a small, portable device. You can wear one while you do your normal daily activities. This is usually used to diagnose what is causing palpitations/syncope (passing out).  Keep your appointment with Dr. Julianne Handler 08/15/14 at 3:45pm.

## 2014-05-28 NOTE — Progress Notes (Signed)
Cardiology Office Note   Date:  05/28/2014   ID:  [name], DOB 19-Nov-1927, MRN [id number]  PCP:  Leonard Downing, MD  Cardiologist:  Dr. Angelena Form     Chief Complaint  Patient presents with  . Hospitalization Follow-up    patient reports fatigue d/t poor circulation      History of Present Illness: [name] is a 81 y.o. female who presents for follow up post freq hospitalizations. 04/19/14, ;05/05/14; 05/22/14.  These are for SOB.  She will be doing well then sudden episodes, these are associated with chest tightness and rapid HR. I found one EKG with HR 145, appeared ST but baseline was with artifact.  She has been compliant with meds and salt.  Her dry wt is 135.   Today without SOB.  She does complain of palpitations.  Described as rapid HR she is not sure if they come before SOB or after.    She has history of CAD, ischemic cardiomyopathy, chronic systolic CHF, HTN, HLD, tobacco abuse, COPD, CKD, PAD, mitral regurgitation who is here. She had an inferior MI in July 2010 treated with a bare metal stent in the RCA, followed by CABG September 2010 with LIMA to LAD and free RIMA Y graft to OM off of LIMA. She also had aortic root replacement at that time secondary to dilated aortic root. She is known to have severe PAD. Last carotid artery dopplers August 2015 with moderate 60-79% bilateral stenosis. She is followed in Nephrology by Dr. Marval Regal and her renal function is stable. Last echo June 2013 in [place] with LVEF 40%, moderate MR. Her EF by Myoview June 2013 was 31%. Her Myoview was abnormal but she was doing well and it was decided not to cath her then.  MOST recent echo 04/20/14 with EF 30-35%,  G2DD- Aortic valve: Valve mobility was restricted. There was mild to moderate stenosis. There was mild regurgitation. Valve area (VTI): 1.46 cm^2. Valve area (Vmax): 1.59 cm^2. Valve area (Vmean): 1.39 cm^2. - Mitral valve: Calcified annulus. There was severe  regurgitation. Valve area by continuity equation (using LVOT flow): 1.85 cm^2. - Left atrium: The atrium was severely dilated. - Pulmonary arteries: Systolic pressure was moderately increased. PA peak pressure: 42 mm Hg (S).   Past Medical History  Diagnosis Date  . CAD (coronary artery disease)     a. 07/2008 Inf STEMI->RCA BMS;  b. 09/2008 CABGx2: LIMA->LAD, RIMA->OM as Y from LIMA;  b. 06/2011 MV EF 31%, bsaal to dist ant infarct, inf/inflat infarct with mild to mod peri-infarct ischemia [facility]).  . Ischemic cardiomyopathy     a. 06/2011 Echo: EF 40%, inf/inflat AK and lat HK, aortic sclerosis w/ mild AI, Mild to mod MR (prev mod-sev), Gr 1 DD, mild TR, RVSP 87mmHg  . Systolic CHF, chronic     a. 06/2011 EF 40%  . Hypertension   . Hyperlipidemia   . Hiatal hernia     large  . Tobacco abuse   . Cerebrovascular disease   . COPD (chronic obstructive pulmonary disease)     presumed   . Arterial disease     peripheral arterial disease with claudication - bilateral SFA disease with ABI's 0.4-0.5 bilaterally   . Mitral regurgitation     a. prev Mod-Sev, read as mild to mod by echo 06/2011 [facility]).  . CKD (chronic kidney disease), stage IV     born with 1 kidney  . MI (myocardial infarction) 2010  .  Thyroid nodule 10/2012    left dominant nodule cytology:non-neoplastic goiter.   . Cataracts, both eyes     not surgical  . Personal history of colonic polyps - adenomas 05/09/2013  . Anemia   . Systolic HF (heart failure)   . Hypertensive renal disease   . Hypertensive heart disease with congestive heart failure   . Aorta aneurysm   . LVH (left ventricular hypertrophy)   . Arteriosclerotic vascular disease   . Bronchiectasis   . Atherosclerosis of abdominal aorta   . Heart murmur   . Shortness of breath dyspnea     Past Surgical History  Procedure Laterality Date  . Laprotomy for bowel obstruction    . Coronary artery bypass graft  09/2008    aortic  thoraic anuerysm resecton 2010.   Marland Kitchen Coronary stent placement  07/2008    BMS to RCA  . Flexible sigmoidoscopy N/A 05/09/2013    Procedure: FLEXIBLE SIGMOIDOSCOPY;  Surgeon: [name], MD;  Location: [place] [facility];  Service: Endoscopy;  Laterality: N/A;  with possible hemorrhoid banding use gastrosope unsdeated  . Appendectomy    . Oophorectomy      one removed in 70's  . Hemorrhoid surgery    . Colonoscopy N/A 05/12/2013    Procedure: COLONOSCOPY;  Surgeon: [name], MD;  Location: [facility];  Service: Endoscopy;  Laterality: N/A;     Current Outpatient Prescriptions  Medication Sig Dispense Refill  . albuterol (PROVENTIL HFA;VENTOLIN HFA) 108 (90 BASE) MCG/ACT inhaler Inhale 2 puffs into the lungs every 4 (four) hours as needed for wheezing or shortness of breath.     Marland Kitchen amLODipine (NORVASC) 10 MG tablet TAKE ONE TABLET BY MOUTH ONCE DAILY 30 tablet 5  . aspirin EC 81 MG tablet Take 81 mg by mouth daily.    . Coenzyme Q-10 100 MG capsule Take 100 mg by mouth daily.      . furosemide (LASIX) 40 MG tablet Take 1 tablet (40 mg total) by mouth daily. 30 tablet 6  . hydrALAZINE (APRESOLINE) 25 MG tablet Take 1 tablet (25 mg total) by mouth 3 (three) times daily. 90 tablet 3  . isosorbide mononitrate (IMDUR) 30 MG 24 hr tablet Take 1 tablet (30 mg total) by mouth daily. 30 tablet 3  . metoprolol tartrate (LOPRESSOR) 25 MG tablet Take 0.5 tablets (12.5 mg total) by mouth daily. 60 tablet 5  . nitroGLYCERIN (NITROSTAT) 0.4 MG SL tablet Place 1 tablet (0.4 mg total) under the tongue every 5 (five) minutes as needed. (Patient taking differently: Place 0.4 mg under the tongue every 5 (five) minutes as needed for chest pain. ) 25 tablet 6  . potassium chloride SA (K-DUR,KLOR-CON) 10 MEQ tablet Take 1 tablet (10 mEq total) by mouth daily. 30 tablet 6  . pravastatin (PRAVACHOL) 80 MG tablet TAKE ONE TABLET BY MOUTH ONCE DAILY IN THE EVENING (Patient taking differently: TAKE ONE TABLET BY MOUTH  ONCE DAILY IN THE afternoon) 30 tablet 11   No current facility-administered medications for this visit.    Allergies:   Review of patient's allergies indicates no known allergies.    Social History:  The patient  reports that she quit smoking about 5 years ago. Her smoking use included Cigarettes. She has a 60 pack-year smoking history. She has never used smokeless tobacco. She reports that she does not drink alcohol or use illicit drugs.   Family History:  The patient's family history includes Coronary artery disease in her brother and mother;  Heart attack in her sister; Heart disease in her mother; Heart failure in her mother; Kidney cancer in her brother; Leukemia in her father, sister, and sister. There is no history of Colon cancer, Pancreatic cancer, Stomach cancer, Rectal cancer, or Stroke.    ROS:  General:no colds or fevers, decrease in weight  Skin:no rashes or ulcers HEENT:no blurred vision, no congestion CV:see HPI PUL:see HPI GI:no diarrhea constipation or melena, no indigestion GU:no hematuria, no dysuria MS:no joint pain, no claudication Neuro:no syncope, no lightheadedness Endo:no diabetes, no thyroid disease  Wt Readings from Last 3 Encounters:  05/28/14 134 lb 9.6 oz (61.054 kg)  05/23/14 135 lb 1.7 oz (61.283 kg)  05/15/14 136 lb (61.689 kg)     PHYSICAL EXAM: VS:  BP 134/64 mmHg  Pulse 72  Ht 5' 6.5" (1.689 m)  Wt 134 lb 9.6 oz (61.054 kg)  BMI 21.40 kg/m2 , BMI Body mass index is 21.4 kg/(m^2). General:Pleasant affect, NAD Skin:Warm and dry, brisk capillary refill HEENT:normocephalic, sclera clear, mucus membranes moist Neck:supple, no JVD, no bruits  Heart:S1S2 RRR without murmur, gallup, rub or click Lungs:clear without rales, rhonchi, or wheezes GBT:DVVO, non tender, + BS, do not palpate liver spleen or masses Ext:no lower ext edema, 2+ pedal pulses, 2+ radial pulses Neuro:alert and oriented, MAE, follows commands, + facial symmetry    EKG:   EKG is NOT ordered today.    Recent Labs: 04/19/2014: ALT 11 05/22/2014: B Natriuretic Peptide 801.8*; TSH 2.033 05/23/2014: BUN 23*; Creatinine 1.80*; Hemoglobin 12.8; Platelets 463*; Potassium 4.0; Sodium 140    Lipid Panel    Component Value Date/Time   CHOL 182 04/25/2014 0844   TRIG 92.0 04/25/2014 0844   HDL 75.50 04/25/2014 0844   CHOLHDL 2 04/25/2014 0844   VLDL 18.4 04/25/2014 0844   LDLCALC 88 04/25/2014 0844   LDLDIRECT 136.2 12/26/2012 0943       Other studies Reviewed: Additional studies/ records that were reviewed today include: 3 hospital notes, OV.   ASSESSMENT AND PLAN:  Acute on chronic combined systolic and diastolic CHF (congestive heart failure) Hospitalized 4/7/-04/22/14, and again 4/24-4/26/16 and 05/22/14-05/23/14.  Currently euvolemic. Continue current meds.   ICM-EF 30-35% echo 04/20/14, 40% by echo June 2013, 31% by Myoview Echo showed new LVD with severe MR, mild Mod AS, LAE, and grade 2 diastolic dysfunction- will send note to Dr. Angelena Form to see if any recommendations.  appt with him is in August.  Rapid HR, will have her wear an event monitor to see if tachycardia sets off the acute episodes.  CKD (chronic kidney disease), stage IV Stable, followed by Dr Arty Baumgartner   CAD- RCA BMS July 2010, CABG x 2 and AO root Sept 2010 Abnormal Myoview 2013 but pt asymptomatic.   PVD - bilat ICA 60-79% Dopplers checked during her admission April 2016- no change, no symptoms   Essential hypertension Controlled     Current medicines are reviewed with the patient today.  The patient Has no concerns regarding medicines.  The following changes have been made:  See above Labs/ tests ordered today include:see above  Disposition:   FU:  see above  Lennie Muckle, NP  05/28/2014 9:23 AM    [facility] [place], [place], [place], [place] Phone: [phone]; Fax:  [phone]

## 2014-05-29 ENCOUNTER — Encounter (HOSPITAL_COMMUNITY): Payer: Self-pay | Admitting: Emergency Medicine

## 2014-05-29 ENCOUNTER — Inpatient Hospital Stay (HOSPITAL_COMMUNITY)
Admission: EM | Admit: 2014-05-29 | Discharge: 2014-06-06 | DRG: 246 | Disposition: A | Discharge status: Home / Self Care | Payer: Medicare Other | Attending: Internal Medicine | Admitting: Internal Medicine

## 2014-05-29 ENCOUNTER — Telehealth: Payer: Self-pay | Admitting: Cardiology

## 2014-05-29 ENCOUNTER — Emergency Department (HOSPITAL_COMMUNITY): Payer: Medicare Other

## 2014-05-29 DIAGNOSIS — I200 Unstable angina: Secondary | ICD-10-CM | POA: Diagnosis not present

## 2014-05-29 DIAGNOSIS — E785 Hyperlipidemia, unspecified: Secondary | ICD-10-CM | POA: Diagnosis not present

## 2014-05-29 DIAGNOSIS — I447 Left bundle-branch block, unspecified: Secondary | ICD-10-CM | POA: Diagnosis present

## 2014-05-29 DIAGNOSIS — N184 Chronic kidney disease, stage 4 (severe): Secondary | ICD-10-CM | POA: Diagnosis present

## 2014-05-29 DIAGNOSIS — I130 Hypertensive heart and chronic kidney disease with heart failure and stage 1 through stage 4 chronic kidney disease, or unspecified chronic kidney disease: Secondary | ICD-10-CM | POA: Diagnosis present

## 2014-05-29 DIAGNOSIS — I5043 Acute on chronic combined systolic (congestive) and diastolic (congestive) heart failure: Secondary | ICD-10-CM | POA: Diagnosis not present

## 2014-05-29 DIAGNOSIS — K644 Residual hemorrhoidal skin tags: Secondary | ICD-10-CM | POA: Diagnosis present

## 2014-05-29 DIAGNOSIS — I2510 Atherosclerotic heart disease of native coronary artery without angina pectoris: Secondary | ICD-10-CM | POA: Diagnosis present

## 2014-05-29 DIAGNOSIS — J449 Chronic obstructive pulmonary disease, unspecified: Secondary | ICD-10-CM | POA: Diagnosis present

## 2014-05-29 DIAGNOSIS — J479 Bronchiectasis, uncomplicated: Secondary | ICD-10-CM | POA: Diagnosis not present

## 2014-05-29 DIAGNOSIS — Z87891 Personal history of nicotine dependence: Secondary | ICD-10-CM

## 2014-05-29 DIAGNOSIS — I340 Nonrheumatic mitral (valve) insufficiency: Secondary | ICD-10-CM | POA: Diagnosis present

## 2014-05-29 DIAGNOSIS — I25110 Atherosclerotic heart disease of native coronary artery with unstable angina pectoris: Principal | ICD-10-CM

## 2014-05-29 DIAGNOSIS — I255 Ischemic cardiomyopathy: Secondary | ICD-10-CM | POA: Diagnosis present

## 2014-05-29 DIAGNOSIS — K623 Rectal prolapse: Secondary | ICD-10-CM | POA: Diagnosis present

## 2014-05-29 DIAGNOSIS — I10 Essential (primary) hypertension: Secondary | ICD-10-CM | POA: Diagnosis not present

## 2014-05-29 DIAGNOSIS — Q600 Renal agenesis, unilateral: Secondary | ICD-10-CM

## 2014-05-29 DIAGNOSIS — Z951 Presence of aortocoronary bypass graft: Secondary | ICD-10-CM

## 2014-05-29 DIAGNOSIS — I739 Peripheral vascular disease, unspecified: Secondary | ICD-10-CM | POA: Diagnosis present

## 2014-05-29 DIAGNOSIS — I5042 Chronic combined systolic (congestive) and diastolic (congestive) heart failure: Secondary | ICD-10-CM | POA: Diagnosis not present

## 2014-05-29 DIAGNOSIS — D649 Anemia, unspecified: Secondary | ICD-10-CM | POA: Diagnosis present

## 2014-05-29 DIAGNOSIS — R079 Chest pain, unspecified: Secondary | ICD-10-CM

## 2014-05-29 DIAGNOSIS — Z6821 Body mass index (BMI) 21.0-21.9, adult: Secondary | ICD-10-CM

## 2014-05-29 DIAGNOSIS — T82857A Stenosis of cardiac prosthetic devices, implants and grafts, initial encounter: Secondary | ICD-10-CM | POA: Diagnosis present

## 2014-05-29 DIAGNOSIS — R222 Localized swelling, mass and lump, trunk: Secondary | ICD-10-CM

## 2014-05-29 DIAGNOSIS — I252 Old myocardial infarction: Secondary | ICD-10-CM

## 2014-05-29 DIAGNOSIS — E041 Nontoxic single thyroid nodule: Secondary | ICD-10-CM | POA: Diagnosis present

## 2014-05-29 DIAGNOSIS — J9601 Acute respiratory failure with hypoxia: Secondary | ICD-10-CM | POA: Diagnosis not present

## 2014-05-29 DIAGNOSIS — Z7982 Long term (current) use of aspirin: Secondary | ICD-10-CM

## 2014-05-29 DIAGNOSIS — I080 Rheumatic disorders of both mitral and aortic valves: Secondary | ICD-10-CM | POA: Diagnosis present

## 2014-05-29 DIAGNOSIS — E440 Moderate protein-calorie malnutrition: Secondary | ICD-10-CM | POA: Diagnosis present

## 2014-05-29 DIAGNOSIS — K922 Gastrointestinal hemorrhage, unspecified: Secondary | ICD-10-CM | POA: Diagnosis present

## 2014-05-29 DIAGNOSIS — K625 Hemorrhage of anus and rectum: Secondary | ICD-10-CM | POA: Insufficient documentation

## 2014-05-29 DIAGNOSIS — Z79899 Other long term (current) drug therapy: Secondary | ICD-10-CM

## 2014-05-29 DIAGNOSIS — Z955 Presence of coronary angioplasty implant and graft: Secondary | ICD-10-CM

## 2014-05-29 LAB — TROPONIN I
TROPONIN I: 0.03 ng/mL (ref <0.031)
Troponin I: 0.03 ng/mL (ref <0.031)
Troponin I: 0.05 ng/mL — ABNORMAL HIGH (ref <0.031)

## 2014-05-29 LAB — BASIC METABOLIC PANEL
Anion gap: 13 (ref 5–15)
BUN: 24 mg/dL — ABNORMAL HIGH (ref 6–20)
CO2: 24 mmol/L (ref 22–32)
Calcium: 9.5 mg/dL (ref 8.9–10.3)
Chloride: 100 mmol/L — ABNORMAL LOW (ref 101–111)
Creatinine, Ser: 1.83 mg/dL — ABNORMAL HIGH (ref 0.44–1.00)
GFR calc non Af Amer: 24 mL/min — ABNORMAL LOW (ref >60)
GFR, EST AFRICAN AMERICAN: 28 mL/min — AB (ref >60)
Glucose, Bld: 103 mg/dL — ABNORMAL HIGH (ref 65–99)
Potassium: 3.8 mmol/L (ref 3.5–5.1)
Sodium: 137 mmol/L (ref 135–145)

## 2014-05-29 LAB — I-STAT TROPONIN, ED: TROPONIN I, POC: 0.04 ng/mL (ref 0.00–0.08)

## 2014-05-29 LAB — CBC
HEMATOCRIT: 40.3 % (ref 36.0–46.0)
Hemoglobin: 13.2 g/dL (ref 12.0–15.0)
MCH: 28.9 pg (ref 26.0–34.0)
MCHC: 32.8 g/dL (ref 30.0–36.0)
MCV: 88.4 fL (ref 78.0–100.0)
Platelets: 459 10*3/uL — ABNORMAL HIGH (ref 150–400)
RBC: 4.56 MIL/uL (ref 3.87–5.11)
RDW: 14.6 % (ref 11.5–15.5)
WBC: 6.4 10*3/uL (ref 4.0–10.5)

## 2014-05-29 LAB — PROTIME-INR
INR: 1.05 (ref 0.00–1.49)
Prothrombin Time: 13.8 seconds (ref 11.6–15.2)

## 2014-05-29 LAB — BRAIN NATRIURETIC PEPTIDE: B Natriuretic Peptide: 851.1 pg/mL — ABNORMAL HIGH (ref 0.0–100.0)

## 2014-05-29 MED ORDER — PRAVASTATIN SODIUM 40 MG PO TABS
80.0000 mg | ORAL_TABLET | Freq: Every day | ORAL | Status: DC
  Administered 2014-05-29 – 2014-06-05 (×8): 80 mg via ORAL
  Filled 2014-05-29 (×8): qty 2
  Filled 2014-05-29: qty 4

## 2014-05-29 MED ORDER — ASPIRIN 81 MG PO CHEW
81.0000 mg | CHEWABLE_TABLET | ORAL | Status: AC
  Administered 2014-05-30: 81 mg via ORAL
  Filled 2014-05-29: qty 1

## 2014-05-29 MED ORDER — ONDANSETRON HCL 4 MG PO TABS
4.0000 mg | ORAL_TABLET | Freq: Four times a day (QID) | ORAL | Status: DC | PRN
  Administered 2014-05-31: 4 mg via ORAL
  Filled 2014-05-29: qty 1

## 2014-05-29 MED ORDER — ASPIRIN EC 325 MG PO TBEC: 325.0000 mg | DELAYED_RELEASE_TABLET | Freq: Every day | ORAL | Status: DC

## 2014-05-29 MED ORDER — HYDRALAZINE HCL 25 MG PO TABS
25.0000 mg | ORAL_TABLET | Freq: Three times a day (TID) | ORAL | Status: DC
  Administered 2014-05-29 – 2014-05-30 (×5): 25 mg via ORAL
  Filled 2014-05-29 (×5): qty 1

## 2014-05-29 MED ORDER — ISOSORBIDE MONONITRATE ER 30 MG PO TB24
30.0000 mg | ORAL_TABLET | Freq: Every day | ORAL | Status: DC
  Administered 2014-05-29 – 2014-06-01 (×4): 30 mg via ORAL
  Filled 2014-05-29 (×4): qty 1

## 2014-05-29 MED ORDER — SODIUM CHLORIDE 0.9 % WEIGHT BASED INFUSION
3.0000 mL/kg/h | INTRAVENOUS | Status: AC
  Administered 2014-05-29: 3 mL/kg/h via INTRAVENOUS

## 2014-05-29 MED ORDER — METOPROLOL TARTRATE 12.5 MG HALF TABLET
12.5000 mg | ORAL_TABLET | Freq: Every day | ORAL | Status: DC
  Administered 2014-05-29: 12.5 mg via ORAL
  Filled 2014-05-29: qty 1

## 2014-05-29 MED ORDER — ENSURE ENLIVE PO LIQD
237.0000 mL | Freq: Two times a day (BID) | ORAL | Status: DC
  Administered 2014-05-31: 237 mL via ORAL

## 2014-05-29 MED ORDER — HEPARIN SODIUM (PORCINE) 5000 UNIT/ML IJ SOLN
5000.0000 [IU] | Freq: Three times a day (TID) | INTRAMUSCULAR | Status: DC
  Administered 2014-05-29 (×2): 5000 [IU] via SUBCUTANEOUS
  Filled 2014-05-29 (×2): qty 1

## 2014-05-29 MED ORDER — COENZYME Q-10 100 MG PO CAPS: 100.0000 mg | ORAL_CAPSULE | Freq: Every day | ORAL | Status: DC

## 2014-05-29 MED ORDER — FUROSEMIDE 10 MG/ML IJ SOLN
40.0000 mg | Freq: Once | INTRAMUSCULAR | Status: AC
  Administered 2014-05-29: 40 mg via INTRAVENOUS
  Filled 2014-05-29: qty 4

## 2014-05-29 MED ORDER — METOPROLOL TARTRATE 12.5 MG HALF TABLET: 12.5000 mg | ORAL_TABLET | Freq: Once | ORAL | Status: DC

## 2014-05-29 MED ORDER — AMLODIPINE BESYLATE 10 MG PO TABS
10.0000 mg | ORAL_TABLET | Freq: Every day | ORAL | Status: DC
  Administered 2014-05-29 – 2014-05-31 (×3): 10 mg via ORAL
  Filled 2014-05-29 (×3): qty 1

## 2014-05-29 MED ORDER — NITROGLYCERIN 0.4 MG SL SUBL
0.4000 mg | SUBLINGUAL_TABLET | SUBLINGUAL | Status: AC | PRN
  Administered 2014-05-29 – 2014-06-02 (×3): 0.4 mg via SUBLINGUAL
  Filled 2014-05-29 (×4): qty 1

## 2014-05-29 MED ORDER — ALBUTEROL SULFATE (2.5 MG/3ML) 0.083% IN NEBU
2.5000 mg | INHALATION_SOLUTION | RESPIRATORY_TRACT | Status: DC | PRN
  Filled 2014-05-29: qty 3

## 2014-05-29 MED ORDER — SODIUM CHLORIDE 0.9 % IJ SOLN
3.0000 mL | Freq: Two times a day (BID) | INTRAMUSCULAR | Status: DC
  Administered 2014-05-30 – 2014-06-03 (×4): 3 mL via INTRAVENOUS

## 2014-05-29 MED ORDER — ONDANSETRON HCL 4 MG/2ML IJ SOLN: 4.0000 mg | Freq: Four times a day (QID) | INTRAMUSCULAR | Status: DC | PRN

## 2014-05-29 MED ORDER — NITROGLYCERIN 0.4 MG SL SUBL: 0.4000 mg | SUBLINGUAL_TABLET | SUBLINGUAL | Status: DC | PRN

## 2014-05-29 MED ORDER — ACETAMINOPHEN 325 MG PO TABS
650.0000 mg | ORAL_TABLET | Freq: Four times a day (QID) | ORAL | Status: DC | PRN
  Administered 2014-06-01: 650 mg via ORAL
  Filled 2014-05-29: qty 2

## 2014-05-29 MED ORDER — SODIUM CHLORIDE 0.9 % WEIGHT BASED INFUSION: 1.0000 mL/kg/h | INTRAVENOUS | Status: DC

## 2014-05-29 MED ORDER — ACETAMINOPHEN 650 MG RE SUPP: 650.0000 mg | Freq: Four times a day (QID) | RECTAL | Status: DC | PRN

## 2014-05-29 MED ORDER — POTASSIUM CHLORIDE CRYS ER 10 MEQ PO TBCR
10.0000 meq | EXTENDED_RELEASE_TABLET | Freq: Every day | ORAL | Status: DC
  Administered 2014-05-29 – 2014-06-06 (×9): 10 meq via ORAL
  Filled 2014-05-29 (×9): qty 1

## 2014-05-29 MED ORDER — FUROSEMIDE 40 MG PO TABS
40.0000 mg | ORAL_TABLET | Freq: Every day | ORAL | Status: DC
  Administered 2014-05-29 – 2014-06-03 (×4): 40 mg via ORAL
  Filled 2014-05-29 (×4): qty 1

## 2014-05-29 MED ORDER — METOPROLOL TARTRATE 25 MG PO TABS
25.0000 mg | ORAL_TABLET | Freq: Every day | ORAL | Status: DC
  Administered 2014-05-30: 25 mg via ORAL
  Filled 2014-05-29: qty 1

## 2014-05-29 NOTE — ED Notes (Signed)
Pt arrived from home by [facility] with c/o CP that woke her up at 0500 and SOB. Pt has had recent admissions in the hospital since April and has been wearing a heart monitor after visiting the cardiologist yesterday. Pt self administered Nitro x1 and 4 baby ASA prior to EMS arrival and stated that Nitro helped with CP. Stated that she still has some heaviness on chest. EMS stated that pt O2sat was 92%ra at home and placed on 2lpm O2 via nasal cannula and O2sat increased to 96%. 12 lead showed wide QRS complex NSR with RBBB unknown history.

## 2014-05-29 NOTE — ED Provider Notes (Signed)
CSN: [id number]     Arrival date & time 05/29/14  1751 History   First MD Initiated Contact with Patient 05/29/14 [phone]     Chief Complaint  Patient presents with  . Chest Pain  . Shortness of Breath     (Consider location/radiation/quality/duration/timing/severity/associated sxs/prior Treatment) Patient is a 81 y.o. female presenting with chest pain and shortness of breath. The history is provided by the patient.  Chest Pain Pain location:  Substernal area Pain quality: pressure   Pain radiates to:  Does not radiate Pain radiates to the back: no   Pain severity:  Moderate Onset quality:  Sudden Timing:  Intermittent Progression:  Waxing and waning Chronicity:  Recurrent Context: at rest   Relieved by:  Nitroglycerin Worsened by:  Nothing tried Associated symptoms: shortness of breath   Associated symptoms: no abdominal pain, no cough, no fatigue, no fever, no nausea and not vomiting   Risk factors: coronary artery disease   Shortness of Breath Associated symptoms: chest pain   Associated symptoms: no abdominal pain, no cough, no fever and no vomiting     Past Medical History  Diagnosis Date  . CAD (coronary artery disease)     a. 07/2008 Inf STEMI->RCA BMS;  b. 09/2008 CABGx2: LIMA->LAD, RIMA->OM as Y from LIMA;  b. 06/2011 MV EF 31%, bsaal to dist ant infarct, inf/inflat infarct with mild to mod peri-infarct ischemia [facility]).  . Ischemic cardiomyopathy     a. 06/2011 Echo: EF 40%, inf/inflat AK and lat HK, aortic sclerosis w/ mild AI, Mild to mod MR (prev mod-sev), Gr 1 DD, mild TR, RVSP 73mmHg  . Systolic CHF, chronic     a. 06/2011 EF 40%  . Hypertension   . Hyperlipidemia   . Hiatal hernia     large  . Tobacco abuse   . Cerebrovascular disease   . COPD (chronic obstructive pulmonary disease)     presumed   . Arterial disease     peripheral arterial disease with claudication - bilateral SFA disease with ABI's 0.4-0.5 bilaterally   . Mitral regurgitation      a. prev Mod-Sev, read as mild to mod by echo 06/2011 [facility]).  . CKD (chronic kidney disease), stage IV     born with 1 kidney  . MI (myocardial infarction) 2010  . Thyroid nodule 10/2012    left dominant nodule cytology:non-neoplastic goiter.   . Cataracts, both eyes     not surgical  . Personal history of colonic polyps - adenomas 05/09/2013  . Anemia   . Systolic HF (heart failure)   . Hypertensive renal disease   . Hypertensive heart disease with congestive heart failure   . Aorta aneurysm   . LVH (left ventricular hypertrophy)   . Arteriosclerotic vascular disease   . Bronchiectasis   . Atherosclerosis of abdominal aorta   . Heart murmur   . Shortness of breath dyspnea    Past Surgical History  Procedure Laterality Date  . Laprotomy for bowel obstruction    . Coronary artery bypass graft  09/2008    aortic thoraic anuerysm resecton 2010.   Marland Kitchen Coronary stent placement  07/2008    BMS to RCA  . Flexible sigmoidoscopy N/A 05/09/2013    Procedure: FLEXIBLE SIGMOIDOSCOPY;  Surgeon: [name], MD;  Location: [place];  Service: Endoscopy;  Laterality: N/A;  with possible hemorrhoid banding use gastrosope unsdeated  . Appendectomy    . Oophorectomy      one removed  in 70's  . Hemorrhoid surgery    . Colonoscopy N/A 05/12/2013    Procedure: COLONOSCOPY;  Surgeon: [name], MD;  [facility];  Service: Endoscopy;  Laterality: N/A;   Family History  Problem Relation Age of Onset  . Coronary artery disease Mother     extensive family history; several other family members.  . Heart disease Mother     CHF  . Coronary artery disease Brother   . Leukemia Sister   . Leukemia Sister   . Leukemia Father   . Colon cancer Neg Hx   . Pancreatic cancer Neg Hx   . Stomach cancer Neg Hx   . Rectal cancer Neg Hx   . Kidney cancer Brother   . Heart failure Mother   . Heart attack Sister   . Stroke Neg Hx    History  Substance Use Topics  .  Smoking status: Former Smoker -- 1.00 packs/day for 60 years    Types: Cigarettes    Quit date: 07/12/2008  . Smokeless tobacco: Never Used  . Alcohol Use: No   OB History    No data available     Review of Systems  Constitutional: Negative for fever, chills and fatigue.  Respiratory: Positive for shortness of breath. Negative for cough.   Cardiovascular: Positive for chest pain.  Gastrointestinal: Negative for nausea, vomiting and abdominal pain.  All other systems reviewed and are negative.     Allergies  Review of patient's allergies indicates no known allergies.  Home Medications   Prior to Admission medications   Medication Sig Start Date End Date Taking? Authorizing Provider  albuterol (PROVENTIL HFA;VENTOLIN HFA) 108 (90 BASE) MCG/ACT inhaler Inhale 2 puffs into the lungs every 4 (four) hours as needed for wheezing or shortness of breath.     Historical Provider, MD  amLODipine (NORVASC) 10 MG tablet TAKE ONE TABLET BY MOUTH ONCE DAILY 04/26/14   Burnell Blanks, MD  aspirin EC 81 MG tablet Take 81 mg by mouth daily.    Historical Provider, MD  Coenzyme Q-10 100 MG capsule Take 100 mg by mouth daily.      Historical Provider, MD  furosemide (LASIX) 40 MG tablet Take 1 tablet (40 mg total) by mouth daily. 04/23/14   Brett Canales, PA-C  hydrALAZINE (APRESOLINE) 25 MG tablet Take 1 tablet (25 mg total) by mouth 3 (three) times daily. 05/09/14   Thayer Headings, MD  isosorbide mononitrate (IMDUR) 30 MG 24 hr tablet Take 1 tablet (30 mg total) by mouth daily. 05/09/14   Thayer Headings, MD  metoprolol tartrate (LOPRESSOR) 25 MG tablet Take 0.5 tablets (12.5 mg total) by mouth daily. 04/22/14   Brett Canales, PA-C  nitroGLYCERIN (NITROSTAT) 0.4 MG SL tablet Place 1 tablet (0.4 mg total) under the tongue every 5 (five) minutes as needed. Patient taking differently: Place 0.4 mg under the tongue every 5 (five) minutes as needed for chest pain.  04/13/14   Burnell Blanks,  MD  potassium chloride SA (K-DUR,KLOR-CON) 10 MEQ tablet Take 1 tablet (10 mEq total) by mouth daily. 04/22/14   Brett Canales, PA-C  pravastatin (PRAVACHOL) 80 MG tablet TAKE ONE TABLET BY MOUTH ONCE DAILY IN THE EVENING Patient taking differently: TAKE ONE TABLET BY MOUTH ONCE DAILY IN THE afternoon 07/21/13   Burnell Blanks, MD   There were no vitals taken for this visit. Physical Exam  Constitutional: She is oriented to person, place, and time. She  appears well-developed and well-nourished. No distress.  HENT:  Head: Normocephalic and atraumatic.  Mouth/Throat: Oropharynx is clear and moist.  Eyes: EOM are normal. Pupils are equal, round, and reactive to light.  Neck: Normal range of motion. Neck supple.  Cardiovascular: Normal rate and regular rhythm.  Exam reveals no friction rub.   No murmur heard. Pulmonary/Chest: Effort normal and breath sounds normal. No respiratory distress. She has no wheezes. She has no rales.  Abdominal: Soft. She exhibits no distension. There is no tenderness. There is no rebound.  Musculoskeletal: Normal range of motion. She exhibits no edema.  Neurological: She is alert and oriented to person, place, and time. No cranial nerve deficit. She exhibits normal muscle tone. Coordination normal.  Skin: No rash noted. She is not diaphoretic.  Nursing note and vitals reviewed.   ED Course  Procedures (including critical care time) Labs Review Labs Reviewed  CBC - Abnormal; Notable for the following:    Platelets 459 (*)    All other components within normal limits  BASIC METABOLIC PANEL - Abnormal; Notable for the following:    Chloride 100 (*)    Glucose, Bld 103 (*)    BUN 24 (*)    Creatinine, Ser 1.83 (*)    GFR calc non Af Amer 24 (*)    GFR calc Af Amer 28 (*)    All other components within normal limits  BRAIN NATRIURETIC PEPTIDE - Abnormal; Notable for the following:    B Natriuretic Peptide 851.1 (*)    All other components within normal  limits  TROPONIN I  TROPONIN I  TROPONIN I  PROTIME-INR  I-STAT TROPOININ, ED    Imaging Review No results found.   EKG Interpretation   Date/Time:  Tuesday May 29 2014 09:25:12 EDT Ventricular Rate:  102 PR Interval:  160 QRS Duration: 137 QT Interval:  388 QTC Calculation: 505 R Axis:   -58 Text Interpretation:  Sinus tachycardia Probable left atrial enlargement  Left bundle branch block No significant change since last tracing  Confirmed by Mingo Amber  MD, Jaxtin Raimondo ([id number]) on 05/29/2014 9:28:50 AM      MDM   Final diagnoses:  Chest pain, unspecified chest pain type    72F with hx of CABG, COPD, CHF presents with central chest pressure. Began this morning at rest. She took 324 mg of aspirin at home. NTG relieved it, no worsening factors. She reports multiple hospital visits for similar over the past month. She had a recent admission for heart failure where she was diuresed about 650 mL of fluid. Her past few visits have presentations similar to her presentation today. EKG similar to prior with LBBB. Will plan on NTG, labs, CXR.  Chest pain gone on reexam. Labs and workup are normal. Admitted for further evaluation.  Evelina Bucy, MD 05/29/14 [phone]

## 2014-05-29 NOTE — Progress Notes (Signed)
PHARMACIST - PHYSICIAN ORDER COMMUNICATION  CONCERNING: P&T Medication Policy on Herbal Medications  DESCRIPTION:  This patient's order for: Coenzyme Q 10  has been noted.  This product(s) is classified as an "herbal" or natural product. Due to a lack of definitive safety studies or FDA approval, nonstandard manufacturing practices, plus the potential risk of unknown drug-drug interactions while on inpatient medications, the Pharmacy and Therapeutics Committee does not permit the use of "herbal" or natural products of this type within [facility].   ACTION TAKEN: The pharmacy department is unable to verify this order at this time and your patient has been informed of this safety policy. Please reevaluate patient's clinical condition at discharge and address if the herbal or natural product(s) should be resumed at that time.  Sherlon Handing, PharmD, BCPS Clinical pharmacist, pager [phone] 05/29/2014 2:05 PM

## 2014-05-29 NOTE — Telephone Encounter (Signed)
Pt's daughter called in wanting to inform the doctor that the pt is currently in the hospital due to chest pressure. She wasn't sure if the pt needed to continue to wear the heart monitor. Please f/u with the daughter.   Thanks

## 2014-05-29 NOTE — Telephone Encounter (Signed)
K. Grandville Silos, [place] is working with patient at [facility] ED. She inquired if there were any events captured on Cardionet monitor. Pulled up monitor report - last notation was around 1238am this morning. No urgent reports from [facility] have been received but if there are any, K. Grandville Silos, [place] would like to be notified (per [place]).   5/16 12:38pm - HR 103 - ST 5/16 12:53pm - HR 89 - 1 PVC 5/16 6:18pm - HR 85 - 5 beat run VT 5/16 8:03pm - HR 84 - PACs

## 2014-05-29 NOTE — Telephone Encounter (Signed)
Ok thanks 

## 2014-05-29 NOTE — H&P (Addendum)
History and Physical  [name] WIO:[id number] DOB: 28-Jul-1927 DOA: 05/29/2014   PCP: Leonard Downing, MD  Referring Physician: ED/ Dr. Mingo Amber  Chief Complaint: chest pain  HPI:  81 year old female with a history of ischemic cardiomyopathy with EF 30-35%, one artery disease with RCA BMS in July 2010, hypertension, COPD, bronchiectasis, hyperlipidemia presents with chest pressure that began at 5 AM on the morning of admission. The patient stated that this work her up with a pressure-like sensation with associated shortness of breath and nausea. She took one nitroglycerin which made it improve. In addition, she took 4 baby aspirin. EMS was activated, and the patient was brought to the emergency department where another nitroglycerin was given to the patient. The patient states that the nitroglycerin helped relieve her chest discomfort. She is presently pain-free. She normally lives alone and is able to ambulate without any assistive devices. She has complained of intermittent palpitations. She went to the cardiology office and saw Cecilie Kicks on 05/28/14 where an event monitor was placed due to her palpitations and chest discomfort. Presently, the patient denies any shortness of breath or chest discomfort. She denies any fevers, chills, coughing, hemoptysis, diarrhea, abdominal pain, dysuria, hematuria, dizziness, syncope. The patient last had a nuclear medicine stress test in June 2013 which was abnormal at that time, but the decision was made not to pursue invasive studies.  In the emergency department, the patient is afebrile and hemodynamically stable. BMP and CBC are unremarkable. BNP was 851. Initial troponin was unremarkable. Assessment/Plan: Atypical chest pain -however, was relieved by NTG -HEART score 5 -previous abnormal myoview -consulted cardiology today -ASA 325 daily -finish cycling troponins -Previous CABG September 2010 2 vessels Chronic systolic and diastolic  CHF/ischemic cardiomyopathy -04/20/2014 EF 38-45%, grade 2 diastolic dysfunction -Currently euvolemic -Dry weight 135 pounds -Continue home dose furosemide -Continue metoprolol tartrate -Continue hydralazine/Imdur Hypertension -Continue amlodipine, metoprolol, hydralazine, Imdur COPD -Stable presently -Albuterol when necessary shortness of breath -Patient has over 40-pack-year history of tobacco, quit 2010 Hyperlipidemia -Continue statin PVD - bilat ICA 60-79% -Dopplers checked during her admission April 2016- no change, no symptoms CKD stage IV -Baseline creatinine 1.7-1.9 Severe mitral regurgitation -Deferred to cardiology       Past Medical History  Diagnosis Date  . CAD (coronary artery disease)     a. 07/2008 Inf STEMI->RCA BMS;  b. 09/2008 CABGx2: LIMA->LAD, RIMA->OM as Y from LIMA;  b. 06/2011 MV EF 31%, bsaal to dist ant infarct, inf/inflat infarct with mild to mod peri-infarct ischemia [facility]).  . Ischemic cardiomyopathy     a. 06/2011 Echo: EF 40%, inf/inflat AK and lat HK, aortic sclerosis w/ mild AI, Mild to mod MR (prev mod-sev), Gr 1 DD, mild TR, RVSP 66mmHg  . Systolic CHF, chronic     a. 06/2011 EF 40%  . Hypertension   . Hyperlipidemia   . Hiatal hernia     large  . Tobacco abuse   . Cerebrovascular disease   . COPD (chronic obstructive pulmonary disease)     presumed   . Arterial disease     peripheral arterial disease with claudication - bilateral SFA disease with ABI's 0.4-0.5 bilaterally   . Mitral regurgitation     a. prev Mod-Sev, read as mild to mod by echo 06/2011 [facility]).  . CKD (chronic kidney disease), stage IV     born with 1 kidney  . MI (myocardial infarction) 2010  . Thyroid nodule 10/2012  left dominant nodule cytology:non-neoplastic goiter.   . Cataracts, both eyes     not surgical  . Personal history of colonic polyps - adenomas 05/09/2013  . Anemia   . Systolic HF (heart failure)   . Hypertensive renal  disease   . Hypertensive heart disease with congestive heart failure   . Aorta aneurysm   . LVH (left ventricular hypertrophy)   . Arteriosclerotic vascular disease   . Bronchiectasis   . Atherosclerosis of abdominal aorta   . Heart murmur   . Shortness of breath dyspnea    Past Surgical History  Procedure Laterality Date  . Laprotomy for bowel obstruction    . Coronary artery bypass graft  09/2008    aortic thoraic anuerysm resecton 2010.   Marland Kitchen Coronary stent placement  07/2008    BMS to RCA  . Flexible sigmoidoscopy N/A 05/09/2013    Procedure: FLEXIBLE SIGMOIDOSCOPY;  Surgeon: [name], MD;  Location: [place];  Service: Endoscopy;  Laterality: N/A;  with possible hemorrhoid banding use gastrosope unsdeated  . Appendectomy    . Oophorectomy      one removed in 70's  . Hemorrhoid surgery    . Colonoscopy N/A 05/12/2013    Procedure: COLONOSCOPY;  Surgeon: [name], MD;  Location: [facility];  Service: Endoscopy;  Laterality: N/A;   Social History:  reports that she quit smoking about 5 years ago. Her smoking use included Cigarettes. She has a 60 pack-year smoking history. She has never used smokeless tobacco. She reports that she does not drink alcohol or use illicit drugs.   Family History  Problem Relation Age of Onset  . Coronary artery disease Mother     extensive family history; several other family members.  . Heart disease Mother     CHF  . Coronary artery disease Brother   . Leukemia Sister   . Leukemia Sister   . Leukemia Father   . Colon cancer Neg Hx   . Pancreatic cancer Neg Hx   . Stomach cancer Neg Hx   . Rectal cancer Neg Hx   . Kidney cancer Brother   . Heart failure Mother   . Heart attack Sister   . Stroke Neg Hx      No Known Allergies    Prior to Admission medications   Medication Sig Start Date End Date Taking? Authorizing Provider  albuterol (PROVENTIL HFA;VENTOLIN HFA) 108 (90 BASE) MCG/ACT inhaler Inhale 2 puffs into the  lungs every 4 (four) hours as needed for wheezing or shortness of breath.    Yes Historical Provider, MD  amLODipine (NORVASC) 10 MG tablet TAKE ONE TABLET BY MOUTH ONCE DAILY 04/26/14  Yes Burnell Blanks, MD  aspirin EC 81 MG tablet Take 81 mg by mouth daily.   Yes Historical Provider, MD  Coenzyme Q-10 100 MG capsule Take 100 mg by mouth daily.     Yes Historical Provider, MD  furosemide (LASIX) 40 MG tablet Take 1 tablet (40 mg total) by mouth daily. 04/23/14  Yes Brett Canales, PA-C  hydrALAZINE (APRESOLINE) 25 MG tablet Take 1 tablet (25 mg total) by mouth 3 (three) times daily. 05/09/14  Yes Thayer Headings, MD  isosorbide mononitrate (IMDUR) 30 MG 24 hr tablet Take 1 tablet (30 mg total) by mouth daily. 05/09/14  Yes Thayer Headings, MD  metoprolol tartrate (LOPRESSOR) 25 MG tablet Take 0.5 tablets (12.5 mg total) by mouth daily. 04/22/14  Yes Brett Canales, PA-C  nitroGLYCERIN (NITROSTAT)  0.4 MG SL tablet Place 1 tablet (0.4 mg total) under the tongue every 5 (five) minutes as needed. Patient taking differently: Place 0.4 mg under the tongue every 5 (five) minutes as needed for chest pain.  04/13/14  Yes Burnell Blanks, MD  potassium chloride SA (K-DUR,KLOR-CON) 10 MEQ tablet Take 1 tablet (10 mEq total) by mouth daily. 04/22/14  Yes Brett Canales, PA-C  pravastatin (PRAVACHOL) 80 MG tablet TAKE ONE TABLET BY MOUTH ONCE DAILY IN THE EVENING Patient taking differently: TAKE ONE TABLET BY MOUTH ONCE DAILY IN THE afternoon 07/21/13  Yes Burnell Blanks, MD    Review of Systems:  Constitutional:  No weight loss, night sweats, Fevers, chills, fatigue.  Head&Eyes: No headache.  No vision loss.  No eye pain or scotoma ENT:  No Difficulty swallowing,Tooth/dental problems,Sore throat,   Cardio-vascular:  No Orthopnea, PND, swelling in lower extremities,  dizziness, palpitations  GI:  No  abdominal pain,diarrhea, loss of appetite, hematochezia, melena, heartburn,  indigestion, Resp:  No shortness of breath with exertion or at rest. No cough. No coughing up of blood .No wheezing.No chest wall deformity  Skin:  no rash or lesions.  GU:  no dysuria, change in color of urine, no urgency or frequency. No flank pain.  Musculoskeletal:  No joint pain or swelling. No decreased range of motion. No back pain.  Psych:  No change in mood or affect. No depression or anxiety. Neurologic: No headache, no dysesthesia, no focal weakness, no vision loss. No syncope  Physical Exam: Filed Vitals:   05/29/14 1038 05/29/14 1133 05/29/14 1145 05/29/14 1245  BP: 136/63 134/59 131/61 143/57  Pulse: 87 87 83 86  Temp:      TempSrc:      Resp: 20 22 17 21   SpO2: 98% 97% 97% 96%   General:  A&O x 3, NAD, nontoxic, pleasant/cooperative Head/Eye: No conjunctival hemorrhage, no icterus, /AT, No nystagmus ENT:  No icterus,  No thrush, good dentition, no pharyngeal exudate Neck:  No masses, no lymphadenpathy, no bruits CV:  RRR, no rub, no gallop, no S3 Lung:  Scattered bilateral crackles without wheeze. Good air movement. Abdomen: soft/NT, +BS, nondistended, no peritoneal signs Ext: No cyanosis, No rashes, No petechiae, No lymphangitis, No edema Neuro: CNII-XII intact, strength 4/5 in bilateral upper and lower extremities, no dysmetria  Labs on Admission:  Basic Metabolic Panel:  Recent Labs Lab 05/23/14 0549 05/29/14 1000  NA 140 137  K 4.0 3.8  CL 99* 100*  CO2 30 24  GLUCOSE 83 103*  BUN 23* 24*  CREATININE 1.80* 1.83*  CALCIUM 9.0 9.5   Liver Function Tests: No results for input(s): AST, ALT, ALKPHOS, BILITOT, PROT, ALBUMIN in the last 168 hours. No results for input(s): LIPASE, AMYLASE in the last 168 hours. No results for input(s): AMMONIA in the last 168 hours. CBC:  Recent Labs Lab 05/23/14 0549 05/29/14 1000  WBC 5.2 6.4  HGB 12.8 13.2  HCT 40.2 40.3  MCV 90.5 88.4  PLT 463* 459*   Cardiac Enzymes:  Recent Labs Lab  05/22/14 1420 05/22/14 1952 05/29/14 1000  TROPONINI 0.05* 0.05* 0.03   BNP: Invalid input(s): POCBNP CBG: No results for input(s): GLUCAP in the last 168 hours.  Radiological Exams on Admission: Dg Chest Portable 1 View  05/29/2014   CLINICAL DATA:  Chest pain.  Short of breath.  EXAM: PORTABLE CHEST - 1 VIEW  COMPARISON:  05/22/2014  FINDINGS: Advanced cardiomegaly is stable. Large hiatal hernia is  stable. Small pleural effusions stable. Vascular congestion and pulmonary edema are stable. No pneumothorax.  IMPRESSION: Stable CHF.   Electronically Signed   By: Marybelle Killings M.D.   On: 05/29/2014 10:21    EKG: Independently reviewed. Sinus rhythm, left bundle branch block    Time spent:60 minutes Code Status:   FULL Family Communication:   No Family at bedside   Abisai Coble, DO  [facility] Pager [phone]  If 7PM-7AM, please contact night-coverage [email or web address] Password [facility] 05/29/2014, 12:48 PM

## 2014-05-29 NOTE — Consult Note (Signed)
Reason for Consult:CKD4, need for cardiac cath Referring Physician: Dr. Lynne Logan is an 81 y.o. female.  HPI: 81 yr female with hx CKD4 from HTN and Renovascular dz.  Has 1 functional kidney with Cr 1.6-2.8.  Has secondary HPTH, and brittle HTN. Extensive vasc hx with CABG in past , Carotid dz, AAA, atherosclerosis of aorta and PVD.  Also ^ lipids, MR, thyroid nodule, HH, recurrent CHF with EF about 35%., bronchiectasis, catarracts.  Hosp x 3 with CHF in past 1 mon.  This am awakened with Chest pressure/pain and SOB.. Pain relief with NTG.  Cardiology eval has rec cath and asked [place] to counsel re risk. Constitutional: poor appetite, limited in activity with leg weakness from PVD and SOB with minimal exertion Eyes: catarracts limit but can read Ears, nose, mouth, throat, and face: false teeth Respiratory: no cough or fevers,but SOB Cardiovascular: see above Gastrointestinal: negative Genitourinary:noct x 3 Integument/breast: easy bruises Hematologic/lymphatic: anemia in past, ?HA Neurological: negative Allergic/Immunologic: negative    Primary Nephrologist Colodonato..  Past Medical History  Diagnosis Date  . CAD (coronary artery disease)     a. 07/2008 Inf STEMI->RCA BMS;  b. 09/2008 CABGx2: LIMA->LAD, RIMA->OM as Y from [facility];  b. 06/2011 MV EF 31%, bsaal to dist ant infarct, inf/inflat infarct with mild to mod peri-infarct ischemia [facility]).  . Ischemic cardiomyopathy     a. 06/2011 Echo: EF 40%, inf/inflat AK and lat HK, aortic sclerosis w/ mild AI, Mild to mod MR (prev mod-sev), Gr 1 DD, mild TR, RVSP 66mmHg  . Systolic CHF, chronic     a. 06/2011 EF 40%  . Hypertension   . Hyperlipidemia   . Hiatal hernia     large  . Tobacco abuse   . Cerebrovascular disease   . COPD (chronic obstructive pulmonary disease)     presumed   . Arterial disease     peripheral arterial disease with claudication - bilateral SFA disease with ABI's 0.4-0.5 bilaterally   .  Mitral regurgitation     a. prev Mod-Sev, read as mild to mod by echo 06/2011 [facility]).  . CKD (chronic kidney disease), stage IV     born with 1 kidney  . MI (myocardial infarction) 2010  . Thyroid nodule 10/2012    left dominant nodule cytology:non-neoplastic goiter.   . Cataracts, both eyes     not surgical  . Personal history of colonic polyps - adenomas 05/09/2013  . Anemia   . Systolic HF (heart failure)   . Hypertensive renal disease   . Hypertensive heart disease with congestive heart failure   . Aorta aneurysm   . LVH (left ventricular hypertrophy)   . Arteriosclerotic vascular disease   . Bronchiectasis   . Atherosclerosis of abdominal aorta   . Heart murmur   . Shortness of breath dyspnea     Past Surgical History  Procedure Laterality Date  . Laprotomy for bowel obstruction    . Coronary artery bypass graft  09/2008    aortic thoraic anuerysm resecton 2010.   Marland Kitchen Coronary stent placement  07/2008    BMS to RCA  . Flexible sigmoidoscopy N/A 05/09/2013    Procedure: FLEXIBLE SIGMOIDOSCOPY;  Surgeon: [name], MD;  Location: [place];  Service: Endoscopy;  Laterality: N/A;  with possible hemorrhoid banding use gastrosope unsdeated  . Appendectomy    . Oophorectomy      one removed in 70's  . Hemorrhoid surgery    .  Colonoscopy N/A 05/12/2013    Procedure: COLONOSCOPY;  Surgeon: [name], MD;  Location: [facility];  Service: Endoscopy;  Laterality: N/A;    Family History  Problem Relation Age of Onset  . Coronary artery disease Mother     extensive family history; several other family members.  . Heart disease Mother     CHF  . Coronary artery disease Brother   . Leukemia Sister   . Leukemia Sister   . Leukemia Father   . Colon cancer Neg Hx   . Pancreatic cancer Neg Hx   . Stomach cancer Neg Hx   . Rectal cancer Neg Hx   . Kidney cancer Brother   . Heart failure Mother   . Heart attack Sister   . Stroke Neg Hx     Social  History:  reports that she quit smoking about 5 years ago. Her smoking use included Cigarettes. She has a 60 pack-year smoking history. She has never used smokeless tobacco. She reports that she does not drink alcohol or use illicit drugs.  Allergies: No Known Allergies  Medications:  I have reviewed the patient's current medications. Prior to Admission:  Prescriptions prior to admission  Medication Sig Dispense Refill Last Dose  . albuterol (PROVENTIL HFA;VENTOLIN HFA) 108 (90 BASE) MCG/ACT inhaler Inhale 2 puffs into the lungs every 4 (four) hours as needed for wheezing or shortness of breath.    Past Week at Unknown time  . amLODipine (NORVASC) 10 MG tablet TAKE ONE TABLET BY MOUTH ONCE DAILY 30 tablet 5 05/28/2014 at Unknown time  . aspirin EC 81 MG tablet Take 81 mg by mouth daily.   05/29/2014 at Unknown time  . Coenzyme Q-10 100 MG capsule Take 100 mg by mouth daily.     05/28/2014 at Unknown time  . furosemide (LASIX) 40 MG tablet Take 1 tablet (40 mg total) by mouth daily. 30 tablet 6 05/28/2014 at Unknown time  . hydrALAZINE (APRESOLINE) 25 MG tablet Take 1 tablet (25 mg total) by mouth 3 (three) times daily. 90 tablet 3 05/29/2014 at Unknown time  . isosorbide mononitrate (IMDUR) 30 MG 24 hr tablet Take 1 tablet (30 mg total) by mouth daily. 30 tablet 3 05/28/2014 at Unknown time  . metoprolol tartrate (LOPRESSOR) 25 MG tablet Take 0.5 tablets (12.5 mg total) by mouth daily. 60 tablet 5 05/28/2014 at 12:00pm  . nitroGLYCERIN (NITROSTAT) 0.4 MG SL tablet Place 1 tablet (0.4 mg total) under the tongue every 5 (five) minutes as needed. (Patient taking differently: Place 0.4 mg under the tongue every 5 (five) minutes as needed for chest pain. ) 25 tablet 6 Past Week at Unknown time  . potassium chloride SA (K-DUR,KLOR-CON) 10 MEQ tablet Take 1 tablet (10 mEq total) by mouth daily. 30 tablet 6 05/28/2014 at Unknown time  . pravastatin (PRAVACHOL) 80 MG tablet TAKE ONE TABLET BY MOUTH ONCE DAILY IN  THE EVENING (Patient taking differently: TAKE ONE TABLET BY MOUTH ONCE DAILY IN THE afternoon) 30 tablet 11 05/28/2014 at Unknown time    Results for orders placed or performed during the hospital encounter of 05/29/14 (from the past 48 hour(s))  CBC     Status: Abnormal   Collection Time: 05/29/14 10:00 AM  Result Value Ref Range   WBC 6.4 4.0 - 10.5 K/uL   RBC 4.56 3.87 - 5.11 MIL/uL   Hemoglobin 13.2 12.0 - 15.0 g/dL   HCT 40.3 36.0 - 46.0 %   MCV 88.4 78.0 - 100.0  fL   MCH 28.9 26.0 - 34.0 pg   MCHC 32.8 30.0 - 36.0 g/dL   RDW 14.6 11.5 - 15.5 %   Platelets 459 (H) 150 - 400 K/uL  Basic metabolic panel     Status: Abnormal   Collection Time: 05/29/14 10:00 AM  Result Value Ref Range   Sodium 137 135 - 145 mmol/L   Potassium 3.8 3.5 - 5.1 mmol/L   Chloride 100 (L) 101 - 111 mmol/L   CO2 24 22 - 32 mmol/L   Glucose, Bld 103 (H) 65 - 99 mg/dL   BUN 24 (H) 6 - 20 mg/dL   Creatinine, Ser 1.83 (H) 0.44 - 1.00 mg/dL   Calcium 9.5 8.9 - 10.3 mg/dL   GFR calc non Af Amer 24 (L) >60 mL/min   GFR calc Af Amer 28 (L) >60 mL/min    Comment: (NOTE) The eGFR has been calculated using the CKD EPI equation. This calculation has not been validated in all clinical situations. eGFR's persistently <60 mL/min signify possible Chronic Kidney Disease.    Anion gap 13 5 - 15  Troponin I     Status: None   Collection Time: 05/29/14 10:00 AM  Result Value Ref Range   Troponin I 0.03 <0.031 ng/mL    Comment:        NO INDICATION OF MYOCARDIAL INJURY.   Brain natriuretic peptide     Status: Abnormal   Collection Time: 05/29/14 10:00 AM  Result Value Ref Range   B Natriuretic Peptide 851.1 (H) 0.0 - 100.0 pg/mL  I-stat troponin, ED     Status: None   Collection Time: 05/29/14 10:13 AM  Result Value Ref Range   Troponin i, poc 0.04 0.00 - 0.08 ng/mL   Comment 3            Comment: Due to the release kinetics of cTnI, a negative result within the first hours of the onset of symptoms does  not rule out myocardial infarction with certainty. If myocardial infarction is still suspected, repeat the test at appropriate intervals.     Dg Chest Portable 1 View  05/29/2014   CLINICAL DATA:  Chest pain.  Short of breath.  EXAM: PORTABLE CHEST - 1 VIEW  COMPARISON:  05/22/2014  FINDINGS: Advanced cardiomegaly is stable. Large hiatal hernia is stable. Small pleural effusions stable. Vascular congestion and pulmonary edema are stable. No pneumothorax.  IMPRESSION: Stable CHF.   Electronically Signed   By: Marybelle Killings M.D.   On: 05/29/2014 10:21    ROS Blood pressure 135/58, pulse 98, temperature 98.9 F (37.2 C), temperature source Oral, resp. rate 22, height $RemoveBe'5\' 6"'EDvAznOvY$  (1.676 m), weight 59.9 kg (132 lb 0.9 oz), SpO2 97 %. Physical Exam Physical Examination: General appearance - alert, well appearing, and in no distress and pale, elderly female Mental status - alert, oriented to person, place, and time Eyes - pupils equal and reactive, extraocular eye movements intact, funduscopic exam normal, discs flat and sharp Mouth - edentulous Neck - adenopathy noted PCL Lymphatics - posterior cervical nodes Chest - rales noted bibasilar Heart - S1 and S2 normal, systolic murmur IL5/7 at apex, Gr2/6 holosys M Abdomen - soft, nontender, nondistended, no masses or organomegaly Liver down 3 cm Musculoskeletal - no joint tenderness, deformity or swelling Extremities - TR DP, trophic changes distally Skin - pale, doughy , thin bruises on abdm  Assessment/Plan: 1 CKD4  From HTN , RVDz,  At baseline.  Discussed risks with her of  CIN and ATE.  Risk of CIN about 25-30%, and dialysis need 2-5%.  Patient has been against dialysis ever.  Discussed that recent hx is not tenable and at risk for major event and cath may identify the cause and provide a solution. Kidney function plays role in recurrent CHF.  Daughter and Son-in-Law participated. 2 Chest pain 3 Hypertension:  4. Anemia not an issue 5. Metabolic  Bone Disease:Hx ^ PTH , not addressed at this time 6 Recurrent CHF as above 7AAA 8 PVD 9 ^ lipids 10 Thyroid nodule P has responded well to low dose diuretics, would hold.  If consents to cath, place on IVf 50cc/h 12 h prior, NS.   Will follow  [place] L 05/29/2014, 3:32 PM

## 2014-05-29 NOTE — Telephone Encounter (Signed)
Returned call to patient's daughter Loletha Carrow.She stated mother is presently at [facility] ER with chest pain.Stated she had to call 911.Advised to continue to wear monitor.Advised if she is admitted and monitor is removed make sure she calls monitor company to let them know.Message sent to Cecilie Kicks NP.

## 2014-05-29 NOTE — Consult Note (Signed)
CARDIOLOGY CONSULT NOTE   Patient ID: [name] MRN: [id number] DOB/AGE: April 28, 1927 81 y.o.  Admit date: 05/29/2014  Primary Physician   Leonard Downing, MD Primary Cardiologist   Dr. Angelena Form  Reason for Consultation  CP and CHF  HPI: [name] is a 81 y.o. female with a history of CAD, ischemic cardiomyopathy, chronic systolic CHF, HTN, HLD, tobacco abuse, COPD, CKD, PAD, and mitral regurgitation  presented with chest pressure that began at 7 AM on the morning of 5/17.  She had an inferior MI in July 2010 treated with a bare metal stent in the RCA, followed by CABG September 2010 with LIMA to LAD and free RIMA Y graft to OM off of LIMA. She also had aortic root replacement at that time secondary to dilated aortic root. She is known to have severe PAD. Last carotid artery dopplers August 2015 with moderate 60-79% bilateral stenosis. She is followed in Nephrology by Dr. Marval Regal and her renal function is stable. Last echo June 2013 in [place] with LVEF 40%, moderate MR. Her Myoview June 2013 was abnormal but she was doing well and it was decided not to cath her then. MOST recent echo 04/20/14 with EF 30-35%; G2DD; severe LAE; severe MR (probably ischemia mediated); mild to moderate AS; mild AI and TR; moderately elevated, pulmonary pressure. She also had Global hypokinesis with akinesis of the basal inferolateral wall.   She hospitalized twice in April 2016, most recent hospitalization 05/22/14  is overnight observation of acute CHF and chest pressure. She diuresed ~671mL and discharged next day. She seen by Cecilie Kicks on 05/28/14 @ clinic where an 30 day event monitor was placed due to her palpitations and chest discomfort; she appeared euvolemic and no change in meds was made. She has been compliant with meds and salt.  Today 05/29/14 she woke up with a palpitation, pressure-like sensation with associated shortness of breath and nausea. She took one nitroglycerin  which made it improve. In addition, she took 4 baby aspirin. EMS was activated, and the patient was brought to the emergency department where another nitroglycerin was given to the patient. The patient states that the nitroglycerin helped relieve her chest discomfort.   Presently, she is asymptomatic. She denies any fevers, chills, coughing, hemoptysis, diarrhea, abdominal pain, dysuria, hematuria, dizziness or syncope. In the emergency department, the patient is afebrile and hemodynamically stable. BMP and CBC are unremarkable. BNP was 851. Initial troponin was unremarkable. CXR showed stable CHF. Creatinine 1.83.Marland Kitchen She was given 40mg  IV lasix in ED.    Past Medical History  Diagnosis Date  . CAD (coronary artery disease)     a. 07/2008 Inf STEMI->RCA BMS;  b. 09/2008 CABGx2: LIMA->LAD, RIMA->OM as Y from LIMA;  b. 06/2011 MV EF 31%, bsaal to dist ant infarct, inf/inflat infarct with mild to mod peri-infarct ischemia [facility]).  . Ischemic cardiomyopathy     a. 06/2011 Echo: EF 40%, inf/inflat AK and lat HK, aortic sclerosis w/ mild AI, Mild to mod MR (prev mod-sev), Gr 1 DD, mild TR, RVSP 67mmHg  . Systolic CHF, chronic     a. 06/2011 EF 40%  . Hypertension   . Hyperlipidemia   . Hiatal hernia     large  . Tobacco abuse   . Cerebrovascular disease   . COPD (chronic obstructive pulmonary disease)     presumed   . Arterial disease     peripheral arterial disease with claudication - bilateral SFA disease with ABI's 0.4-0.5  bilaterally   . Mitral regurgitation     a. prev Mod-Sev, read as mild to mod by echo 06/2011 [facility]).  . CKD (chronic kidney disease), stage IV     born with 1 kidney  . MI (myocardial infarction) 2010  . Thyroid nodule 10/2012    left dominant nodule cytology:non-neoplastic goiter.   . Cataracts, both eyes     not surgical  . Personal history of colonic polyps - adenomas 05/09/2013  . Anemia   . Systolic HF (heart failure)   . Hypertensive renal  disease   . Hypertensive heart disease with congestive heart failure   . Aorta aneurysm   . LVH (left ventricular hypertrophy)   . Arteriosclerotic vascular disease   . Bronchiectasis   . Atherosclerosis of abdominal aorta   . Heart murmur   . Shortness of breath dyspnea      Past Surgical History  Procedure Laterality Date  . Laprotomy for bowel obstruction    . Coronary artery bypass graft  09/2008    aortic thoraic anuerysm resecton 2010.   Marland Kitchen Coronary stent placement  07/2008    BMS to RCA  . Flexible sigmoidoscopy N/A 05/09/2013    Procedure: FLEXIBLE SIGMOIDOSCOPY;  Surgeon: [name], MD;  Location: [facility] [place];  Service: Endoscopy;  Laterality: N/A;  with possible hemorrhoid banding use gastrosope unsdeated  . Appendectomy    . Oophorectomy      one removed in 70's  . Hemorrhoid surgery    . Colonoscopy N/A 05/12/2013    Procedure: COLONOSCOPY;  Surgeon: [name], MD;  Location: [facility];  Service: Endoscopy;  Laterality: N/A;    No Known Allergies  I have reviewed the patient's current medications     nitroGLYCERIN  Prior to Admission medications   Medication Sig Start Date End Date Taking? Authorizing Provider  albuterol (PROVENTIL HFA;VENTOLIN HFA) 108 (90 BASE) MCG/ACT inhaler Inhale 2 puffs into the lungs every 4 (four) hours as needed for wheezing or shortness of breath.    Yes Historical Provider, MD  amLODipine (NORVASC) 10 MG tablet TAKE ONE TABLET BY MOUTH ONCE DAILY 04/26/14  Yes Burnell Blanks, MD  aspirin EC 81 MG tablet Take 81 mg by mouth daily.   Yes Historical Provider, MD  Coenzyme Q-10 100 MG capsule Take 100 mg by mouth daily.     Yes Historical Provider, MD  furosemide (LASIX) 40 MG tablet Take 1 tablet (40 mg total) by mouth daily. 04/23/14  Yes Brett Canales, PA-C  hydrALAZINE (APRESOLINE) 25 MG tablet Take 1 tablet (25 mg total) by mouth 3 (three) times daily. 05/09/14  Yes Thayer Headings, MD  isosorbide mononitrate (IMDUR)  30 MG 24 hr tablet Take 1 tablet (30 mg total) by mouth daily. 05/09/14  Yes Thayer Headings, MD  metoprolol tartrate (LOPRESSOR) 25 MG tablet Take 0.5 tablets (12.5 mg total) by mouth daily. 04/22/14  Yes Brett Canales, PA-C  nitroGLYCERIN (NITROSTAT) 0.4 MG SL tablet Place 1 tablet (0.4 mg total) under the tongue every 5 (five) minutes as needed. Patient taking differently: Place 0.4 mg under the tongue every 5 (five) minutes as needed for chest pain.  04/13/14  Yes Burnell Blanks, MD  potassium chloride SA (K-DUR,KLOR-CON) 10 MEQ tablet Take 1 tablet (10 mEq total) by mouth daily. 04/22/14  Yes Brett Canales, PA-C  pravastatin (PRAVACHOL) 80 MG tablet TAKE ONE TABLET BY MOUTH ONCE DAILY IN THE EVENING Patient taking differently: TAKE  ONE TABLET BY MOUTH ONCE DAILY IN THE afternoon 07/21/13  Yes Burnell Blanks, MD     History   Social History  . Marital Status: Widowed    Spouse Name: N/A  . Number of Children: Y  . Years of Education: N/A   Occupational History  . 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    Social History Main Topics  . Smoking status: Former Smoker -- 1.00 packs/day for 60 years    Types: Cigarettes    Quit date: 07/12/2008  . Smokeless tobacco: Never Used  . Alcohol Use: No  . Drug Use: No  . Sexual Activity: Not on file   Other Topics Concern  . Not on file   Social History Narrative   She lives in [place]. She is retired and widowed.     Family Status  Relation Status Death Age  . Mother Deceased   . Brother Deceased   . Sister Alive   . Sister Deceased   . Brother Alive   . Brother Deceased   . Sister Alive   . Father Deceased    Family History  Problem Relation Age of Onset  . Coronary artery disease Mother     extensive family history; several other family members.  . Heart disease Mother     CHF  . Coronary artery disease Brother   . Leukemia Sister   . Leukemia Sister   . Leukemia Father   . Colon cancer Neg Hx    . Pancreatic cancer Neg Hx   . Stomach cancer Neg Hx   . Rectal cancer Neg Hx   . Kidney cancer Brother   . Heart failure Mother   . Heart attack Sister   . Stroke Neg Hx      ROS:  Full 14 point review of systems complete and found to be negative unless listed above.  Physical Exam: Blood pressure 143/57, pulse 86, temperature 98.1 F (36.7 C), temperature source Oral, resp. rate 21, SpO2 96 %.  General: Well developed, well nourished, female in no acute distress Head: Eyes PERRLA, No xanthomas. Normocephalic and atraumatic, oropharynx without edema or exudate.  Lungs: Resp regular and unlabored. Bibasilar crackles.  Heart: regular rthytam with rates 90-100s. no s3, s4,. Systolic murmurs. Neck: No carotid bruits. No lymphadenopathy.  + JVD. Abdomen: Bowel sounds present, abdomen soft and non-tender without masses or hernias noted. Msk:  No spine or cva tenderness. No weakness, no joint deformities or effusions. Extremities: No clubbing, cyanosis or edema. DP/PT/Radials 2+ and equal bilaterally. Neuro: Alert and oriented X 3. No focal deficits noted. Psych:  Good affect, responds appropriately Skin: No rashes or lesions noted.  Labs:   Lab Results  Component Value Date   WBC 6.4 05/29/2014   HGB 13.2 05/29/2014   HCT 40.3 05/29/2014   MCV 88.4 05/29/2014   PLT 459* 05/29/2014    Recent Labs Lab 05/29/14 1000  NA 137  K 3.8  CL 100*  CO2 24  BUN 24*  CREATININE 1.83*  CALCIUM 9.5  GLUCOSE 103*   Recent Labs  05/29/14 1000  TROPONINI 0.03    Recent Labs  05/29/14 1013  TROPIPOC 0.04   Lab Results  Component Value Date   CHOL 182 04/25/2014   HDL 75.50 04/25/2014   LDLCALC 88 04/25/2014   TRIG 92.0 04/25/2014    Echo: 04/20/2014 Study Conclusions  - Left ventricle: The cavity size was mildly dilated. Wall thickness was normal. Systolic function was moderately  to severely reduced. The estimated ejection fraction was in the range of 30% to  35%. Diffuse hypokinesis. There is akinesis of the basalinferolateral myocardium. Features are consistent with a pseudonormal left ventricular filling pattern, with concomitant abnormal relaxation and increased filling pressure (grade 2 diastolic dysfunction). - Aortic valve: Valve mobility was restricted. There was mild to moderate stenosis. There was mild regurgitation. Valve area (VTI): 1.46 cm^2. Valve area (Vmax): 1.59 cm^2. Valve area (Vmean): 1.39 cm^2. - Mitral valve: Calcified annulus. There was severe regurgitation. Valve area by continuity equation (using LVOT flow): 1.85 cm^2. - Left atrium: The atrium was severely dilated. - Pulmonary arteries: Systolic pressure was moderately increased. PA peak pressure: 42 mm Hg (S).  Impressions:  - Global hypokinesis with akinesis of the basal inferolateral wall; overall moderate to severe LV dysfunction; grade 2 diastolic dysfunction; severe LAE; severe MR (probably ischemia mediated); mild to moderate AS; mild AI and TR; moderately elevated pulmonary pressure.  ECG:  Sinus tachycardia with rate 102. Probable left atrial enlargement Left bundle branch block No significant change since last tracing  Radiology:  Dg Chest Portable 1 View  05/29/2014   CLINICAL DATA:  Chest pain.  Short of breath.  EXAM: PORTABLE CHEST - 1 VIEW  COMPARISON:  05/22/2014  FINDINGS: Advanced cardiomegaly is stable. Large hiatal hernia is stable. Small pleural effusions stable. Vascular congestion and pulmonary edema are stable. No pneumothorax.  IMPRESSION: Stable CHF.   Electronically Signed   By: Marybelle Killings M.D.   On: 05/29/2014 10:21    ASSESSMENT AND PLAN:    Active Problems:   Chest pain  1. Unstable angina - Presented with SSCP CP/pressure relieved by NTG. Currently asymptomatic.  - Troponin x1 negative, ECG with no acute ST or TW change - Telemtry for observation - cycle Troponin and serial ECGs - previous abnormal  myoview in 2013. No further work up needed at that time.  -  Continue ASA.  - Given multiple recent hospitalization with s/s concerning of ACS and palpitations, plan to proceed with for ischemic evaluation cath after nephrology consult..   2. Chronic systolic and diastolic CHF/ischemic cardiomyopathy -04/20/2014 EF 78-67%, grade 2 diastolic dysfunction, severe LAE; severe MR (probably ischemia mediated); mild to moderate AS; mild AI and TR; moderately elevated pulmonary pressure. -Dry weight 135 pounds. Mild volume overload. Given IV lasix 40mg  in ED.  - She received home dose lasix 40mg  po 1430 today and further management per nephrology recommendation.  - Will increase lopressor to 25mg  qd.  Nitro PRN.  -- continue hydralazine/Imdur.  3.Hypertension - As above  4. Palpitation - Intermittent palpitation. Placed 30 day event monitor yesterday. Event monitor showed nothing so far.    5. COPD - per IM  6. Hyperlipidemia -Continue statin  7. PVD - bilat ICA 60-79% -Dopplers checked during her admission April 2016- no change, no symptoms  8. CKD stage IV -Baseline creatinine 1.7-1.9. She states she only has one kidney and follows by Dr. Marval Regal. Creatinine 1.83. Lytes normal. Will get nephrology consult before cath.    SignedLeanor Kail, PA 05/29/2014, 12:53 PM Pager [phone]   Active Problems:   CAD- RCA BMS July 2010, CABG x 2 and AO root Sept 2010   ICM-EF 30-35% echo 04/20/14,  40% by echo June 2013, 31% by Myoview   Acute on chronic combined systolic and diastolic CHF (congestive heart failure)   Unstable angina   Dyslipidemia   Moderate MR   Essential hypertension   Chronic combined systolic and  diastolic heart failure, NYHA class 2   PVD - bilat ICA 60-79%   CKD (chronic kidney disease), stage IV   Bronchiectasis without acute exacerbation   Chest pain   Chronic combined systolic and diastolic CHF (congestive heart failure)   I have seen, examined  and evaluated the patient this PM along with Mr. Curly Shores, [place].  After reviewing all the available data and chart,  I agree with his findings, examination as well as impression recommendations.  The patient is a very pleasant 81 year old woman with single kidney and baseline creatinine somewhere between 1.7 and 2. She has known CAD status post 2 vessel CABG 2010. Abnormal Myoview treated medically from June 2013. She also has ischemic myopathy with EF of roughly 3035% pre-and she's had 5 emergency/911 calls with this being her third admission in the last couple months. Each has been for what sounds like combined systolic/diastolic heart failure exacerbation.  Each time she has been diuresed and discharged only to bounce back again. This morning however her symptom was more consistent with a chest heaviness and pressure associated with orthopnea and PND. She was just seen in the clinic yesterday and was doing relatively well and stable post discharge with no PND or orthopnea symptoms. She was placed on monitor to evaluate palpitations which did occur this morning, but were not found to be anything significant on the monitor.  At this point I am concerned that her recurrent symptoms are related to ischemic diastolic heart failure and now with chest pain at rest concerning for unstable angina. She's had multiple admissions, and finally now needs to have some type of definitive answer. We spent a long time discussing potential options and I think the best course of action would be to proceed with cardiac catheterization. We have had nephrology also consult as far as her kidney CKD.  The best way to answer the question would be to proceed with diagnostic right left heart catheterization. I will tentatively schedule catheterization for tomorrow afternoon which will also start IV hydration tomorrow morning and 50 4 AM. Is artery received IV and by mouth Lasix today with a brisk urine output.   Leonie Man,  M.D., M.S. Interventional Cardiologist   Pager # [phone]

## 2014-05-30 ENCOUNTER — Encounter (HOSPITAL_COMMUNITY)
Admission: EM | Disposition: A | Discharge status: Home / Self Care | Payer: Medicare Other | Source: Home / Self Care | Attending: Internal Medicine

## 2014-05-30 ENCOUNTER — Encounter (HOSPITAL_COMMUNITY): Payer: Self-pay | Admitting: Cardiology

## 2014-05-30 DIAGNOSIS — R079 Chest pain, unspecified: Secondary | ICD-10-CM

## 2014-05-30 DIAGNOSIS — J9601 Acute respiratory failure with hypoxia: Secondary | ICD-10-CM | POA: Diagnosis not present

## 2014-05-30 DIAGNOSIS — E785 Hyperlipidemia, unspecified: Secondary | ICD-10-CM | POA: Diagnosis not present

## 2014-05-30 DIAGNOSIS — J479 Bronchiectasis, uncomplicated: Secondary | ICD-10-CM

## 2014-05-30 DIAGNOSIS — K625 Hemorrhage of anus and rectum: Secondary | ICD-10-CM | POA: Diagnosis not present

## 2014-05-30 DIAGNOSIS — N184 Chronic kidney disease, stage 4 (severe): Secondary | ICD-10-CM | POA: Diagnosis not present

## 2014-05-30 DIAGNOSIS — I255 Ischemic cardiomyopathy: Secondary | ICD-10-CM | POA: Diagnosis not present

## 2014-05-30 DIAGNOSIS — I5043 Acute on chronic combined systolic (congestive) and diastolic (congestive) heart failure: Secondary | ICD-10-CM | POA: Diagnosis not present

## 2014-05-30 DIAGNOSIS — I200 Unstable angina: Secondary | ICD-10-CM | POA: Diagnosis not present

## 2014-05-30 DIAGNOSIS — E440 Moderate protein-calorie malnutrition: Secondary | ICD-10-CM | POA: Diagnosis present

## 2014-05-30 DIAGNOSIS — I25110 Atherosclerotic heart disease of native coronary artery with unstable angina pectoris: Secondary | ICD-10-CM | POA: Diagnosis not present

## 2014-05-30 DIAGNOSIS — I5042 Chronic combined systolic (congestive) and diastolic (congestive) heart failure: Secondary | ICD-10-CM | POA: Diagnosis not present

## 2014-05-30 HISTORY — PX: CARDIAC CATHETERIZATION: SHX172

## 2014-05-30 LAB — POCT I-STAT 3, VENOUS BLOOD GAS (G3P V)
Acid-base deficit: 1 mmol/L (ref 0.0–2.0)
Bicarbonate: 24.5 mEq/L — ABNORMAL HIGH (ref 20.0–24.0)
O2 Saturation: 72 %
PCO2 VEN: 42.1 mmHg — AB (ref 45.0–50.0)
PO2 VEN: 39 mmHg (ref 30.0–45.0)
TCO2: 26 mmol/L (ref 0–100)
pH, Ven: 7.373 — ABNORMAL HIGH (ref 7.250–7.300)

## 2014-05-30 LAB — CBC
HCT: 40.9 % (ref 36.0–46.0)
Hemoglobin: 13.1 g/dL (ref 12.0–15.0)
MCH: 28.9 pg (ref 26.0–34.0)
MCHC: 32 g/dL (ref 30.0–36.0)
MCV: 90.3 fL (ref 78.0–100.0)
PLATELETS: 461 10*3/uL — AB (ref 150–400)
RBC: 4.53 MIL/uL (ref 3.87–5.11)
RDW: 14.6 % (ref 11.5–15.5)
WBC: 5.9 10*3/uL (ref 4.0–10.5)

## 2014-05-30 LAB — CREATININE, SERUM
CREATININE: 1.77 mg/dL — AB (ref 0.44–1.00)
GFR calc non Af Amer: 25 mL/min — ABNORMAL LOW (ref >60)
GFR, EST AFRICAN AMERICAN: 29 mL/min — AB (ref >60)

## 2014-05-30 LAB — POCT I-STAT 3, ART BLOOD GAS (G3+)
Bicarbonate: 24.5 mEq/L — ABNORMAL HIGH (ref 20.0–24.0)
O2 Saturation: 94 %
PCO2 ART: 40.4 mmHg (ref 35.0–45.0)
PH ART: 7.39 (ref 7.350–7.450)
PO2 ART: 70 mmHg — AB (ref 80.0–100.0)
TCO2: 26 mmol/L (ref 0–100)

## 2014-05-30 LAB — BASIC METABOLIC PANEL
Anion gap: 9 (ref 5–15)
BUN: 23 mg/dL — ABNORMAL HIGH (ref 6–20)
CALCIUM: 8.9 mg/dL (ref 8.9–10.3)
CO2: 27 mmol/L (ref 22–32)
Chloride: 103 mmol/L (ref 101–111)
Creatinine, Ser: 1.82 mg/dL — ABNORMAL HIGH (ref 0.44–1.00)
GFR calc Af Amer: 28 mL/min — ABNORMAL LOW (ref >60)
GFR calc non Af Amer: 24 mL/min — ABNORMAL LOW (ref >60)
GLUCOSE: 77 mg/dL (ref 65–99)
Potassium: 3.6 mmol/L (ref 3.5–5.1)
Sodium: 139 mmol/L (ref 135–145)

## 2014-05-30 SURGERY — RIGHT/LEFT HEART CATH AND CORONARY/GRAFT ANGIOGRAPHY

## 2014-05-30 MED ORDER — CLOPIDOGREL BISULFATE 300 MG PO TABS
300.0000 mg | ORAL_TABLET | Freq: Once | ORAL | Status: AC
  Administered 2014-05-30: 300 mg via ORAL
  Filled 2014-05-30: qty 1

## 2014-05-30 MED ORDER — NITROGLYCERIN 1 MG/10 ML FOR IR/CATH LAB
INTRA_ARTERIAL | Status: AC
  Filled 2014-05-30: qty 10

## 2014-05-30 MED ORDER — SODIUM CHLORIDE 0.9 % IV SOLN: 250.0000 mL | INTRAVENOUS | Status: DC | PRN

## 2014-05-30 MED ORDER — ASPIRIN EC 81 MG PO TBEC
81.0000 mg | DELAYED_RELEASE_TABLET | Freq: Every day | ORAL | Status: DC
  Administered 2014-05-31 – 2014-06-03 (×3): 81 mg via ORAL
  Filled 2014-05-30 (×5): qty 1

## 2014-05-30 MED ORDER — LIDOCAINE HCL (PF) 1 % IJ SOLN
INTRAMUSCULAR | Status: AC
  Filled 2014-05-30: qty 30

## 2014-05-30 MED ORDER — MIDAZOLAM HCL 2 MG/2ML IJ SOLN
INTRAMUSCULAR | Status: AC
Start: 2014-05-30 — End: 2014-05-30
  Filled 2014-05-30: qty 2

## 2014-05-30 MED ORDER — CLOPIDOGREL BISULFATE 75 MG PO TABS
75.0000 mg | ORAL_TABLET | Freq: Every day | ORAL | Status: DC
  Administered 2014-05-31 – 2014-06-04 (×5): 75 mg via ORAL
  Filled 2014-05-30 (×5): qty 1

## 2014-05-30 MED ORDER — MIDAZOLAM HCL 2 MG/2ML IJ SOLN
INTRAMUSCULAR | Status: DC | PRN
  Administered 2014-05-30: 1 mg via INTRAVENOUS

## 2014-05-30 MED ORDER — IOHEXOL 300 MG/ML  SOLN
INTRAMUSCULAR | Status: DC | PRN
  Administered 2014-05-30: 125 mL via INTRAVENOUS

## 2014-05-30 MED ORDER — SODIUM CHLORIDE 0.9 % WEIGHT BASED INFUSION: 3.0000 mL/kg/h | INTRAVENOUS | Status: AC

## 2014-05-30 MED ORDER — SODIUM CHLORIDE 0.9 % IJ SOLN: 3.0000 mL | INTRAMUSCULAR | Status: DC | PRN

## 2014-05-30 MED ORDER — SODIUM CHLORIDE 0.9 % IJ SOLN
3.0000 mL | Freq: Two times a day (BID) | INTRAMUSCULAR | Status: DC
  Administered 2014-05-31 (×2): 3 mL via INTRAVENOUS

## 2014-05-30 MED ORDER — HEPARIN (PORCINE) IN NACL 2-0.9 UNIT/ML-% IJ SOLN
INTRAMUSCULAR | Status: AC
  Filled 2014-05-30: qty 1500

## 2014-05-30 MED ORDER — HEPARIN SODIUM (PORCINE) 5000 UNIT/ML IJ SOLN
5000.0000 [IU] | Freq: Three times a day (TID) | INTRAMUSCULAR | Status: DC
Start: 2014-05-30 — End: 2014-06-04
  Administered 2014-05-30 – 2014-06-02 (×6): 5000 [IU] via SUBCUTANEOUS
  Filled 2014-05-30 (×8): qty 1

## 2014-05-30 SURGICAL SUPPLY — 12 items
CATH INFINITI 5 FR IM (CATHETERS) ×3 IMPLANT
CATH INFINITI 5FR MULTPACK ANG (CATHETERS) ×6 IMPLANT
CATH SWAN GANZ 7F STRAIGHT (CATHETERS) ×3 IMPLANT
GUIDEWIRE STRAIGHT .035 145CM (WIRE) ×3 IMPLANT
KIT HEART LEFT (KITS) ×3 IMPLANT
KIT HEART RIGHT NAMIC (KITS) ×3 IMPLANT
PACK CARDIAC CATHETERIZATION (CUSTOM PROCEDURE TRAY) ×3 IMPLANT
SHEATH PINNACLE 5F 10CM (SHEATH) ×3 IMPLANT
SHEATH PINNACLE 7F 10CM (SHEATH) ×3 IMPLANT
TRANSDUCER W/STOPCOCK (MISCELLANEOUS) ×3 IMPLANT
WIRE EMERALD 3MM-J .035X150CM (WIRE) ×3 IMPLANT
WIRE HI TORQ VERSACORE-J 145CM (WIRE) ×3 IMPLANT

## 2014-05-30 NOTE — Progress Notes (Signed)
Patient Name: [name] Date of Encounter: 05/30/2014  Active Problems:  CAD- RCA BMS July 2010, CABG x 2 and AO root Sept 2010  ICM-EF 30-35% echo 04/20/14, 40% by echo June 2013, 31% by Myoview  Acute on chronic combined systolic and diastolic CHF (congestive heart failure)  Unstable angina  Dyslipidemia  Moderate MR  Essential hypertension  Chronic combined systolic and diastolic heart failure, NYHA class 2  PVD - bilat ICA 60-79%  CKD (chronic kidney disease), stage IV  Bronchiectasis without acute exacerbation  Chest pain  Chronic combined systolic and diastolic CHF (congestive heart failure)  SUBJECTIVE  Feels better. Denies chest pain, SOB or palpitation. Breathing better. On IV fluid pre cath.   CURRENT MEDS . amLODipine  10 mg Oral Daily  . [START ON 05/31/2014] aspirin EC  325 mg Oral Daily  . feeding supplement (ENSURE ENLIVE)  237 mL Oral BID BM  . furosemide  40 mg Oral Daily  . heparin  5,000 Units Subcutaneous 3 times per day  . hydrALAZINE  25 mg Oral TID  . isosorbide mononitrate  30 mg Oral Daily  . metoprolol tartrate  12.5 mg Oral Once  . metoprolol tartrate  25 mg Oral Daily  . potassium chloride SA  10 mEq Oral Daily  . pravastatin  80 mg Oral q1800  . sodium chloride  3 mL Intravenous Q12H    OBJECTIVE  Filed Vitals:   05/29/14 1359 05/29/14 1400 05/29/14 2000 05/30/14 0543  BP:  135/58 118/51 104/55  Pulse:  98 86 61  Temp:  98.9 F (37.2 C) 98.5 F (36.9 C) 98.6 F (37 C)  TempSrc:  Oral Oral Oral  Resp:    20  Height: 5\' 6"  (1.676 m)     Weight: 132 lb 0.9 oz (59.9 kg)   132 lb 4.4 oz (60 kg)  SpO2:  97% 97% 94%    Intake/Output Summary (Last 24 hours) at 05/30/14 0907 Last data filed at 05/30/14 0634  Gross per 24 hour  Intake 650.32 ml  Output   2400 ml  Net -1749.68 ml   Filed Weights   05/29/14 1359 05/30/14 0543  Weight: 132 lb 0.9 oz (59.9 kg) 132 lb 4.4 oz (60 kg)    PHYSICAL EXAM  General:  Pleasant, NAD. Neuro: Alert and oriented X 3. Moves all extremities spontaneously. Psych: Normal affect. HEENT:  Normal  Neck: Supple without bruits or JVD. Lungs:  Resp regular and unlabored. Bibasilar crackles.  Heart: RRR no s3, s4, or murmurs. Abdomen: Soft, non-tender, non-distended, BS + x 4.  Extremities: No clubbing, cyanosis or edema. DP/PT/Radials 2+ and equal bilaterally.  Accessory Clinical Findings  CBC  Recent Labs  05/29/14 1000  WBC 6.4  HGB 13.2  HCT 40.3  MCV 88.4  PLT 333*   Basic Metabolic Panel  Recent Labs  05/29/14 1000 05/30/14 0342  NA 137 139  K 3.8 3.6  CL 100* 103  CO2 24 27  GLUCOSE 103* 77  BUN 24* 23*  CREATININE 1.83* 1.82*  CALCIUM 9.5 8.9    Recent Labs  05/29/14 1000 05/29/14 1546 05/29/14 2205  TROPONINI 0.03 0.03 0.05*    TELE  Sinus rhythm with rate in 60s. Occasional PVCs.    ASSESSMENT AND PLAN  [name] is a 81 y.o. female with a history of CAD, ischemic cardiomyopathy, chronic systolic CHF, HTN, HLD, tobacco abuse, COPD, CKD, PAD, and mitral regurgitation presented 5/17 with unstable angina.  1. Unstable  angina -  Presented with SSCP CP/pressure relieved by NTG. Currently asymptomatic. ECG with no acute ST or TW change. Peak of trop is 0.05. -  Right/Left Heart Cath and Coronary/Graft Angiography today at @1300 . - Continue ASA. PRN nitro.   2. Chronic systolic and diastolic CHF/ischemic cardiomyopathy -04/20/2014 EF 28-63%, grade 2 diastolic dysfunction, severe LAE; severe MR (probably ischemia mediated); mild to moderate AS; mild AI and TR; moderately elevated pulmonary pressure. - She had 5 emergency/911 calls with this being her third admission in the last couple months. Each time she has been diuresed and discharged only to bounce back again. Cath today for ischemic eval.  - net I/O negative 1.7 L on low dose of lasix. She started on IV fluid for hydration pre cath . Held lasix pre cath. On  heparin. Monitor closely for volume status. Creatinine stable at 1.82. -  lopressor increased to 25mg  qd for high rate. Rate is stable. Nitro PRN.  -- Continue hydralazine/Imdur.  3.Hypertension - As above. BP relatively soft low. May decrease amlodipine or hydralazine dose. MD to decide.   4. Palpitation - Intermittent palpitation. Placed 30 day event monitor 5/16. Tele with occasional PVCs. Palpitation improve overnight.   5. CKD - IV - single kidney and baseline creatinine somewhere between 1.7 and 2. Nephrology following.   6. COPD - per IM  7. Hyperlipidemia -Continue statin  8. PVD - bilat ICA 60-79% -Dopplers checked during her admission April 2016- no change, no symptoms   Signed, Bhagat,Bhavinkumar PA-C Pager [phone]  I saw the patient briefly this morning along with Mr. Curly Shores, [place].   We discussed the patient as well as findings, examination and recommendations and all the available data. I agree with his note above.  Occasional PVCs noted on telemetry and no arrhythmias noted on event monitor prior to arrival. Her presenting symptoms are probably more consistent with unstable angina, the plan was for her to proceed with cardiac catheterization today.  I actually talked to Dr. Martinique later after the cardiac catheterization that revealed significant RCA and diagonal lesions that will be planned to undergo staged PCI on Friday with Dr. Martinique provided stability of renal function.  For now would continue current medications for blood pressure as an cholesterol. She will receive precath hydration similar to this morning for staged PCI on Friday.   Leonie Man, M.D., M.S. Interventional Cardiologist   Pager # [phone]

## 2014-05-30 NOTE — Progress Notes (Signed)
Pt ambulated in the room post bed rest. R groin level 0.

## 2014-05-30 NOTE — Progress Notes (Signed)
Pt resting in bed and denies any CP, troponin .05. Md on call paged. Will cont to monitor pt.

## 2014-05-30 NOTE — Progress Notes (Signed)
Initial Nutrition Assessment  DOCUMENTATION CODES:  Non-severe (moderate) malnutrition in context of chronic illness  INTERVENTION:  Ensure Enlive (each supplement provides 350kcal and 20 grams of protein), Snacks  NUTRITION DIAGNOSIS:  Malnutrition related to chronic illness as evidenced by moderate depletion of body fat, moderate depletions of muscle mass, percent weight loss.   GOAL:  Patient will meet greater than or equal to 90% of their needs   MONITOR:  Diet advancement, PO intake, Supplement acceptance, Labs, Weight trends, I & O's  REASON FOR ASSESSMENT:  Malnutrition Screening Tool    ASSESSMENT: 81 year old female with a history of ischemic cardiomyopathy with EF 30-35%, one artery disease with RCA BMS in July 2010, hypertension, COPD, bronchiectasis, hyperlipidemia presents with chest pressure that began at 5 AM on the morning of admission.  Pt reports that she has had a very poor appetite for several months and she eats very little. She reports losing from 149 lbs down to 132 lbs but, she relates some of this weight loss to fluid loss. Reviewed pt's dietary recall. She reports drinking Boost if she skips a meal.  Breakfast: oatmeal or a pastry and juice Lunch: 1/2 [place] sandwich Dinner: Yogurt, fruit, crackers Per physical exam, pt has moderate wasting of muscle and fat mass. RD emphasized the importance of nutrition. Encouraged intake of more frequent meals and snacks and nutritional supplements.  Labs: low GFR  Height:  Ht Readings from Last 1 Encounters:  05/29/14 5\' 6"  (1.676 m)    Weight:  Wt Readings from Last 1 Encounters:  05/30/14 132 lb 4.4 oz (60 kg)    Ideal Body Weight:  59.1 kg  Wt Readings from Last 10 Encounters:  05/30/14 132 lb 4.4 oz (60 kg)  05/28/14 134 lb 9.6 oz (61.054 kg)  05/23/14 135 lb 1.7 oz (61.283 kg)  05/15/14 136 lb (61.689 kg)  05/08/14 133 lb 14.4 oz (60.737 kg)  05/03/14 139 lb (63.05 kg)  04/22/14 134 lb 8  oz (61.009 kg)  04/13/14 141 lb (63.957 kg)  07/21/13 141 lb (63.957 kg)  05/10/13 142 lb (64.411 kg)    BMI:  Body mass index is 21.36 kg/(m^2).  Estimated Nutritional Needs:  Kcal:  1450-1650  Protein:  85-100 grams  Fluid:  1.5-1.7 L/day  Skin:  Reviewed, no issues  Diet Order:  Diet NPO time specified Except for: Sips with Meds  EDUCATION NEEDS:  No education needs identified at this time   Intake/Output Summary (Last 24 hours) at 05/30/14 1349 Last data filed at 05/30/14 0634  Gross per 24 hour  Intake 650.32 ml  Output   1300 ml  Net -649.68 ml    Last BM:  5/16  Pryor Ochoa RD, LDN Inpatient Clinical Dietitian Pager: [phone] After Hours Pager: [phone]

## 2014-05-30 NOTE — Interval H&P Note (Signed)
History and Physical Interval Note:  05/30/2014 1:56 PM  [name]  has presented today for surgery, with the diagnosis of cp  The various methods of treatment have been discussed with the patient and family. After consideration of risks, benefits and other options for treatment, the patient has consented to  Procedure(s): Right/Left Heart Cath and Coronary/Graft Angiography (N/A) as a surgical intervention .  The patient's history has been reviewed, patient examined, no change in status, stable for surgery.  I have reviewed the patient's chart and labs.  Questions were answered to the patient's satisfaction.   Cath Lab Visit (complete for each Cath Lab visit)  Clinical Evaluation Leading to the Procedure:   ACS: Yes.    Non-ACS:    Anginal Classification: CCS III  Anti-ischemic medical therapy: Maximal Therapy (2 or more classes of medications)  Non-Invasive Test Results: No non-invasive testing performed  Prior CABG: Previous CABG        [name] [facility] 05/30/2014 1:56 PM

## 2014-05-30 NOTE — Progress Notes (Signed)
Subjective: Interval History: has no complaint, no CP or SOB.  Objective: Vital signs in last 24 hours: Temp:  [98.1 F (36.7 C)-98.9 F (37.2 C)] 98.6 F (37 C) (05/18 0543) Pulse Rate:  [61-101] 61 (05/18 0543) Resp:  [17-22] 20 (05/18 0543) BP: (104-151)/(51-79) 104/55 mmHg (05/18 0543) SpO2:  [94 %-98 %] 94 % (05/18 0543) Weight:  [59.9 kg (132 lb 0.9 oz)-60 kg (132 lb 4.4 oz)] 60 kg (132 lb 4.4 oz) (05/18 0543) Weight change:   Intake/Output from previous day: 05/17 0701 - 05/18 0700 In: 650.3 [P.O.:240; I.V.:410.3] Out: 2400 [Urine:2400] Intake/Output this shift:    General appearance: alert, cooperative and pale Resp: rales bibasilar GI: soft,pos bs,liver down 3 cm Extremities: edema 1+ Pulses: L carotid, abdm bruit, bilat fem bruits  CV reg Gr3/6 holosys M at apex, Gr2/6 SEM ULSB  Lab Results:  Recent Labs  05/29/14 1000  WBC 6.4  HGB 13.2  HCT 40.3  PLT 459*   BMET:  Recent Labs  05/29/14 1000 05/30/14 0342  NA 137 139  K 3.8 3.6  CL 100* 103  CO2 24 27  GLUCOSE 103* 77  BUN 24* 23*  CREATININE 1.83* 1.82*  CALCIUM 9.5 8.9   No results for input(s): PTH in the last 72 hours. Iron Studies: No results for input(s): IRON, TIBC, TRANSFERRIN, FERRITIN in the last 72 hours.  Studies/Results: Dg Chest Portable 1 View  05/29/2014   CLINICAL DATA:  Chest pain.  Short of breath.  EXAM: PORTABLE CHEST - 1 VIEW  COMPARISON:  05/22/2014  FINDINGS: Advanced cardiomegaly is stable. Large hiatal hernia is stable. Small pleural effusions stable. Vascular congestion and pulmonary edema are stable. No pneumothorax.  IMPRESSION: Stable CHF.   Electronically Signed   By: Marybelle Killings M.D.   On: 05/29/2014 10:21    I have reviewed the patient's current medications.  Assessment/Plan: 1 CKD 4 answered questions, vol ok. gettting NS precath, follow closely vol ,Cr, perip exam 2 HTn 3 HPTH 4 PVD 5 CAD 6 IBs 7 MR 8 Angina 9 Recurrent CHF P Cath, follow fluid,  Cr, extrem     Tamiya Colello L 05/30/2014,8:00 AM

## 2014-05-30 NOTE — Progress Notes (Signed)
Site area: rt groin fa and fv sheaths Site Prior to Removal:  Level  0 Pressure Applied For:  20 minutes Manual:   yes Patient Status During Pull:  stable Post Pull Site:  Level  0 Post Pull Instructions Given:  yes Post Pull Pulses Present: yes Dressing Applied:  tegaderm Bedrest begins @  [id number] Comments:  none

## 2014-05-30 NOTE — Progress Notes (Signed)
[facility] Progress Note   [name] NID:[id number] DOB: December 18, 1927 DOA: 05/29/2014 PCP: Leonard Downing, MD  Brief narrative: [name] is a 81 y.o. female with a history of ischemic cardiomyopathy with EF 30-35%, one artery disease with RCA BMS in July 2010, hypertension, COPD, bronchiectasis, hyperlipidemia presents with chest pressure that began at 5 AM on the morning of admission.    Subjective: Patient is evaluated prior to cardiac cath-she had no complaints of chest pain, pressure or shortness of breath  Assessment/Plan: Principal Problem:   Chest pain-- - CAD- RCA BMS July 2010, CABG x 2 and AO root Sept 2010 - Troponin noted to be mildly elevated at 0.05- EKG did not reveal any acute findings -Left and right heart cath performed today reveals 3 vessel obstructive coronary artery disease-see full report below-cardiology recommends a staged PCI of the proximal RCA and the first diagonal once cleared by renal -Continue aspirin and Plavix- she was not on Plavix prior to this admission  Active Problems:   Dyslipidemia -Continue statin    Essential hypertension -Continue amlodipine, hydralazine, Imdur, Lopressor, Lasix    Chronic combined systolic and diastolic heart failure, NYHA class 2- ICM-EF 30-35% echo 04/20/14,  40% by echo June 2013, 31% by Myoview -Continue Lasix and potassium- all of daily weights and I and O    PVD - bilat ICA 60-79% -Continue aspirin and statin    Bronchiectasis without acute exacerbation -Continue albuterol inhaler    CKD (chronic kidney disease), stage IV -Nephrology following    Malnutrition of moderate degree -Has been started on Ensure supplements   Code Status: Full code Family Communication:  Disposition Plan: Home when stable DVT prophylaxis: Heparin Consultants: Cardiology and nephrology Procedures: Cardiac cath 5/18  Prox RCA lesion, 85% stenosed. In stent.  Prox Cx to Mid Cx lesion, 70%  stenosed.  1st Mrg lesion, 80% stenosed.  Prox LAD to Mid LAD lesion, 50% stenosed.  1st Diag lesion, 90% stenosed.  Normal right heart pressures and LV filling pressures.  1. 3 vessel obstructive CAD 2. Patent LIMA graft to the LAD 3. Patent free RIMA graft from the mid LIMA to the second OM 4. Normal right heart pressures and LV filling pressures. 5. Mild AV gradient.    Antibiotics: Anti-infectives    None      Objective: Filed Weights   05/29/14 1359 05/30/14 0543  Weight: 59.9 kg (132 lb 0.9 oz) 60 kg (132 lb 4.4 oz)    Intake/Output Summary (Last 24 hours) at 05/30/14 1630 Last data filed at 05/30/14 0634  Gross per 24 hour  Intake 650.32 ml  Output    900 ml  Net -249.68 ml     Vitals Filed Vitals:   05/30/14 1535 05/30/14 1540 05/30/14 1543 05/30/14 1603  BP: 118/46 126/53 120/65 118/54  Pulse: 73 73 72   Temp:      TempSrc:      Resp: 21 19 21    Height:      Weight:      SpO2: 88% 90% 91%     Exam:  General:  Pt is alert, not in acute distress  HEENT: No icterus, No thrush  Cardiovascular: regular rate and rhythm, S1/S2 No murmur  Respiratory: clear to auscultation bilaterally   Abdomen: Soft, +Bowel sounds, non tender, non distended, no guarding  MSK: No LE edema, cyanosis or clubbing  Data Reviewed: Basic Metabolic Panel:  Recent Labs Lab 05/29/14 1000 05/30/14 0342  NA 137 139  K  3.8 3.6  CL 100* 103  CO2 24 27  GLUCOSE 103* 77  BUN 24* 23*  CREATININE 1.83* 1.82*  CALCIUM 9.5 8.9   Liver Function Tests: No results for input(s): AST, ALT, ALKPHOS, BILITOT, PROT, ALBUMIN in the last 168 hours. No results for input(s): LIPASE, AMYLASE in the last 168 hours. No results for input(s): AMMONIA in the last 168 hours. CBC:  Recent Labs Lab 05/29/14 1000  WBC 6.4  HGB 13.2  HCT 40.3  MCV 88.4  PLT 459*   Cardiac Enzymes:  Recent Labs Lab 05/29/14 1000 05/29/14 1546 05/29/14 2205  TROPONINI 0.03 0.03 0.05*    BNP (last 3 results)  Recent Labs  05/05/14 1935 05/22/14 0409 05/29/14 1000  BNP 949.1* 801.8* 851.1*    ProBNP (last 3 results) No results for input(s): PROBNP in the last 8760 hours.  CBG: No results for input(s): GLUCAP in the last 168 hours.  No results found for this or any previous visit (from the past 240 hour(s)).   Studies: Dg Chest Portable 1 View  05/29/2014   CLINICAL DATA:  Chest pain.  Short of breath.  EXAM: PORTABLE CHEST - 1 VIEW  COMPARISON:  05/22/2014  FINDINGS: Advanced cardiomegaly is stable. Large hiatal hernia is stable. Small pleural effusions stable. Vascular congestion and pulmonary edema are stable. No pneumothorax.  IMPRESSION: Stable CHF.   Electronically Signed   By: Marybelle Killings M.D.   On: 05/29/2014 10:21    Scheduled Meds:  Scheduled Meds: . amLODipine  10 mg Oral Daily  . [START ON 05/31/2014] aspirin EC  81 mg Oral Daily  . clopidogrel  300 mg Oral Once  . [START ON 05/31/2014] clopidogrel  75 mg Oral Daily  . feeding supplement (ENSURE ENLIVE)  237 mL Oral BID BM  . furosemide  40 mg Oral Daily  . heparin  5,000 Units Subcutaneous 3 times per day  . hydrALAZINE  25 mg Oral TID  . isosorbide mononitrate  30 mg Oral Daily  . metoprolol tartrate  12.5 mg Oral Once  . metoprolol tartrate  25 mg Oral Daily  . potassium chloride SA  10 mEq Oral Daily  . pravastatin  80 mg Oral q1800  . sodium chloride  3 mL Intravenous Q12H  . sodium chloride  3 mL Intravenous Q12H   Continuous Infusions: . sodium chloride 3 mL/kg/hr (05/30/14 1542)    Time spent on care of this patient: 35 minutes   Alcan Border, MD 05/30/2014, 4:30 PM    [facility] Office  [phone] Pager - Text Page per [email or web address]  If 7PM-7AM, please contact night-coverage [email or web address]

## 2014-05-30 NOTE — H&P (View-Only) (Signed)
CARDIOLOGY CONSULT NOTE   Patient ID: [name] MRN: [id number] DOB/AGE: 79-Aug-1929 79 y.o.  Admit date: 05/29/2014  Primary Physician   Leonard Downing, MD Primary Cardiologist   Dr. Angelena Form  Reason for Consultation  CP and CHF  HPI: [name] is a 81 y.o. female with a history of CAD, ischemic cardiomyopathy, chronic systolic CHF, HTN, HLD, tobacco abuse, COPD, CKD, PAD, and mitral regurgitation  presented with chest pressure that began at 7 AM on the morning of 5/17.  She had an inferior MI in July 2010 treated with a bare metal stent in the RCA, followed by CABG September 2010 with LIMA to LAD and free RIMA Y graft to OM off of LIMA. She also had aortic root replacement at that time secondary to dilated aortic root. She is known to have severe PAD. Last carotid artery dopplers August 2015 with moderate 60-79% bilateral stenosis. She is followed in Nephrology by Dr. Marval Regal and her renal function is stable. Last echo June 2013 in [place] with LVEF 40%, moderate MR. Her Myoview June 2013 was abnormal but she was doing well and it was decided not to cath her then. MOST recent echo 04/20/14 with EF 30-35%; G2DD; severe LAE; severe MR (probably ischemia mediated); mild to moderate AS; mild AI and TR; moderately elevated, pulmonary pressure. She also had Global hypokinesis with akinesis of the basal inferolateral wall.   She hospitalized twice in April 2016, most recent hospitalization 05/22/14  is overnight observation of acute CHF and chest pressure. She diuresed ~624mL and discharged next day. She seen by Cecilie Kicks on 05/28/14 @ clinic where an 30 day event monitor was placed due to her palpitations and chest discomfort; she appeared euvolemic and no change in meds was made. She has been compliant with meds and salt.  Today 05/29/14 she woke up with a palpitation, pressure-like sensation with associated shortness of breath and nausea. She took one nitroglycerin  which made it improve. In addition, she took 4 baby aspirin. EMS was activated, and the patient was brought to the emergency department where another nitroglycerin was given to the patient. The patient states that the nitroglycerin helped relieve her chest discomfort.   Presently, she is asymptomatic. She denies any fevers, chills, coughing, hemoptysis, diarrhea, abdominal pain, dysuria, hematuria, dizziness or syncope. In the emergency department, the patient is afebrile and hemodynamically stable. BMP and CBC are unremarkable. BNP was 851. Initial troponin was unremarkable. CXR showed stable CHF. Creatinine 1.83.Marland Kitchen She was given 40mg  IV lasix in ED.    Past Medical History  Diagnosis Date  . CAD (coronary artery disease)     a. 07/2008 Inf STEMI->RCA BMS;  b. 09/2008 CABGx2: LIMA->LAD, RIMA->OM as Y from LIMA;  b. 06/2011 MV EF 31%, bsaal to dist ant infarct, inf/inflat infarct with mild to mod peri-infarct ischemia [facility]).  . Ischemic cardiomyopathy     a. 06/2011 Echo: EF 40%, inf/inflat AK and lat HK, aortic sclerosis w/ mild AI, Mild to mod MR (prev mod-sev), Gr 1 DD, mild TR, RVSP 47mmHg  . Systolic CHF, chronic     a. 06/2011 EF 40%  . Hypertension   . Hyperlipidemia   . Hiatal hernia     large  . Tobacco abuse   . Cerebrovascular disease   . COPD (chronic obstructive pulmonary disease)     presumed   . Arterial disease     peripheral arterial disease with claudication - bilateral SFA disease with ABI's 0.4-0.5  bilaterally   . Mitral regurgitation     a. prev Mod-Sev, read as mild to mod by echo 06/2011 [facility]).  . CKD (chronic kidney disease), stage IV     born with 1 kidney  . MI (myocardial infarction) 2010  . Thyroid nodule 10/2012    left dominant nodule cytology:non-neoplastic goiter.   . Cataracts, both eyes     not surgical  . Personal history of colonic polyps - adenomas 05/09/2013  . Anemia   . Systolic HF (heart failure)   . Hypertensive renal  disease   . Hypertensive heart disease with congestive heart failure   . Aorta aneurysm   . LVH (left ventricular hypertrophy)   . Arteriosclerotic vascular disease   . Bronchiectasis   . Atherosclerosis of abdominal aorta   . Heart murmur   . Shortness of breath dyspnea      Past Surgical History  Procedure Laterality Date  . Laprotomy for bowel obstruction    . Coronary artery bypass graft  09/2008    aortic thoraic anuerysm resecton 2010.   Marland Kitchen Coronary stent placement  07/2008    BMS to RCA  . Flexible sigmoidoscopy N/A 05/09/2013    Procedure: FLEXIBLE SIGMOIDOSCOPY;  Surgeon: [name], MD;  Location: [facility];  Service: Endoscopy;  Laterality: N/A;  with possible hemorrhoid banding use gastrosope unsdeated  . Appendectomy    . Oophorectomy      one removed in 70's  . Hemorrhoid surgery    . Colonoscopy N/A 05/12/2013    Procedure: COLONOSCOPY;  Surgeon: [name], MD;  Location: [facility];  Service: Endoscopy;  Laterality: N/A;    No Known Allergies  I have reviewed the patient's current medications     nitroGLYCERIN  Prior to Admission medications   Medication Sig Start Date End Date Taking? Authorizing Provider  albuterol (PROVENTIL HFA;VENTOLIN HFA) 108 (90 BASE) MCG/ACT inhaler Inhale 2 puffs into the lungs every 4 (four) hours as needed for wheezing or shortness of breath.    Yes Historical Provider, MD  amLODipine (NORVASC) 10 MG tablet TAKE ONE TABLET BY MOUTH ONCE DAILY 04/26/14  Yes Burnell Blanks, MD  aspirin EC 81 MG tablet Take 81 mg by mouth daily.   Yes Historical Provider, MD  Coenzyme Q-10 100 MG capsule Take 100 mg by mouth daily.     Yes Historical Provider, MD  furosemide (LASIX) 40 MG tablet Take 1 tablet (40 mg total) by mouth daily. 04/23/14  Yes Brett Canales, PA-C  hydrALAZINE (APRESOLINE) 25 MG tablet Take 1 tablet (25 mg total) by mouth 3 (three) times daily. 05/09/14  Yes Thayer Headings, MD  isosorbide mononitrate (IMDUR)  30 MG 24 hr tablet Take 1 tablet (30 mg total) by mouth daily. 05/09/14  Yes Thayer Headings, MD  metoprolol tartrate (LOPRESSOR) 25 MG tablet Take 0.5 tablets (12.5 mg total) by mouth daily. 04/22/14  Yes Brett Canales, PA-C  nitroGLYCERIN (NITROSTAT) 0.4 MG SL tablet Place 1 tablet (0.4 mg total) under the tongue every 5 (five) minutes as needed. Patient taking differently: Place 0.4 mg under the tongue every 5 (five) minutes as needed for chest pain.  04/13/14  Yes Burnell Blanks, MD  potassium chloride SA (K-DUR,KLOR-CON) 10 MEQ tablet Take 1 tablet (10 mEq total) by mouth daily. 04/22/14  Yes Brett Canales, PA-C  pravastatin (PRAVACHOL) 80 MG tablet TAKE ONE TABLET BY MOUTH ONCE DAILY IN THE EVENING Patient taking differently: TAKE  ONE TABLET BY MOUTH ONCE DAILY IN THE afternoon 07/21/13  Yes Burnell Blanks, MD     History   Social History  . Marital Status: Widowed    Spouse Name: N/A  . Number of Children: Y  . Years of Education: N/A   Occupational History  . 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    Social History Main Topics  . Smoking status: Former Smoker -- 1.00 packs/day for 60 years    Types: Cigarettes    Quit date: 07/12/2008  . Smokeless tobacco: Never Used  . Alcohol Use: No  . Drug Use: No  . Sexual Activity: Not on file   Other Topics Concern  . Not on file   Social History Narrative   She lives in [place]. She is retired and widowed.     Family Status  Relation Status Death Age  . Mother Deceased   . Brother Deceased   . Sister Alive   . Sister Deceased   . Brother Alive   . Brother Deceased   . Sister Alive   . Father Deceased    Family History  Problem Relation Age of Onset  . Coronary artery disease Mother     extensive family history; several other family members.  . Heart disease Mother     CHF  . Coronary artery disease Brother   . Leukemia Sister   . Leukemia Sister   . Leukemia Father   . Colon cancer Neg Hx     . Pancreatic cancer Neg Hx   . Stomach cancer Neg Hx   . Rectal cancer Neg Hx   . Kidney cancer Brother   . Heart failure Mother   . Heart attack Sister   . Stroke Neg Hx      ROS:  Full 14 point review of systems complete and found to be negative unless listed above.  Physical Exam: Blood pressure 143/57, pulse 86, temperature 98.1 F (36.7 C), temperature source Oral, resp. rate 21, SpO2 96 %.  General: Well developed, well nourished, female in no acute distress Head: Eyes PERRLA, No xanthomas. Normocephalic and atraumatic, oropharynx without edema or exudate.  Lungs: Resp regular and unlabored. Bibasilar crackles.  Heart: regular rthytam with rates 90-100s. no s3, s4,. Systolic murmurs. Neck: No carotid bruits. No lymphadenopathy.  + JVD. Abdomen: Bowel sounds present, abdomen soft and non-tender without masses or hernias noted. Msk:  No spine or cva tenderness. No weakness, no joint deformities or effusions. Extremities: No clubbing, cyanosis or edema. DP/PT/Radials 2+ and equal bilaterally. Neuro: Alert and oriented X 3. No focal deficits noted. Psych:  Good affect, responds appropriately Skin: No rashes or lesions noted.  Labs:   Lab Results  Component Value Date   WBC 6.4 05/29/2014   HGB 13.2 05/29/2014   HCT 40.3 05/29/2014   MCV 88.4 05/29/2014   PLT 459* 05/29/2014    Recent Labs Lab 05/29/14 1000  NA 137  K 3.8  CL 100*  CO2 24  BUN 24*  CREATININE 1.83*  CALCIUM 9.5  GLUCOSE 103*   Recent Labs  05/29/14 1000  TROPONINI 0.03    Recent Labs  05/29/14 1013  TROPIPOC 0.04   Lab Results  Component Value Date   CHOL 182 04/25/2014   HDL 75.50 04/25/2014   LDLCALC 88 04/25/2014   TRIG 92.0 04/25/2014    Echo: 04/20/2014 Study Conclusions  - Left ventricle: The cavity size was mildly dilated. Wall thickness was normal. Systolic function was  moderately to severely reduced. The estimated ejection fraction was in the range of 30% to  35%. Diffuse hypokinesis. There is akinesis of the basalinferolateral myocardium. Features are consistent with a pseudonormal left ventricular filling pattern, with concomitant abnormal relaxation and increased filling pressure (grade 2 diastolic dysfunction). - Aortic valve: Valve mobility was restricted. There was mild to moderate stenosis. There was mild regurgitation. Valve area (VTI): 1.46 cm^2. Valve area (Vmax): 1.59 cm^2. Valve area (Vmean): 1.39 cm^2. - Mitral valve: Calcified annulus. There was severe regurgitation. Valve area by continuity equation (using LVOT flow): 1.85 cm^2. - Left atrium: The atrium was severely dilated. - Pulmonary arteries: Systolic pressure was moderately increased. PA peak pressure: 42 mm Hg (S).  Impressions:  - Global hypokinesis with akinesis of the basal inferolateral wall; overall moderate to severe LV dysfunction; grade 2 diastolic dysfunction; severe LAE; severe MR (probably ischemia mediated); mild to moderate AS; mild AI and TR; moderately elevated pulmonary pressure.  ECG:  Sinus tachycardia with rate 102. Probable left atrial enlargement Left bundle branch block No significant change since last tracing  Radiology:  Dg Chest Portable 1 View  05/29/2014   CLINICAL DATA:  Chest pain.  Short of breath.  EXAM: PORTABLE CHEST - 1 VIEW  COMPARISON:  05/22/2014  FINDINGS: Advanced cardiomegaly is stable. Large hiatal hernia is stable. Small pleural effusions stable. Vascular congestion and pulmonary edema are stable. No pneumothorax.  IMPRESSION: Stable CHF.   Electronically Signed   By: Marybelle Killings M.D.   On: 05/29/2014 10:21    ASSESSMENT AND PLAN:    Active Problems:   Chest pain  1. Unstable angina - Presented with SSCP CP/pressure relieved by NTG. Currently asymptomatic.  - Troponin x1 negative, ECG with no acute ST or TW change - Telemtry for observation - cycle Troponin and serial ECGs - previous abnormal  myoview in 2013. No further work up needed at that time.  -  Continue ASA.  - Given multiple recent hospitalization with s/s concerning of ACS and palpitations, plan to proceed with for ischemic evaluation cath after nephrology consult..   2. Chronic systolic and diastolic CHF/ischemic cardiomyopathy -04/20/2014 EF 44-96%, grade 2 diastolic dysfunction, severe LAE; severe MR (probably ischemia mediated); mild to moderate AS; mild AI and TR; moderately elevated pulmonary pressure. -Dry weight 135 pounds. Mild volume overload. Given IV lasix 40mg  in ED.  - She received home dose lasix 40mg  po 1430 today and further management per nephrology recommendation.  - Will increase lopressor to 25mg  qd.  Nitro PRN.  -- continue hydralazine/Imdur.  3.Hypertension - As above  4. Palpitation - Intermittent palpitation. Placed 30 day event monitor yesterday. Event monitor showed nothing so far.    5. COPD - per IM  6. Hyperlipidemia -Continue statin  7. PVD - bilat ICA 60-79% -Dopplers checked during her admission April 2016- no change, no symptoms  8. CKD stage IV -Baseline creatinine 1.7-1.9. She states she only has one kidney and follows by Dr. Marval Regal. Creatinine 1.83. Lytes normal. Will get nephrology consult before cath.    SignedLeanor Kail, PA 05/29/2014, 12:53 PM Pager [phone]   Active Problems:   CAD- RCA BMS July 2010, CABG x 2 and AO root Sept 2010   ICM-EF 30-35% echo 04/20/14,  40% by echo June 2013, 31% by Myoview   Acute on chronic combined systolic and diastolic CHF (congestive heart failure)   Unstable angina   Dyslipidemia   Moderate MR   Essential hypertension   Chronic combined systolic  and diastolic heart failure, NYHA class 2   PVD - bilat ICA 60-79%   CKD (chronic kidney disease), stage IV   Bronchiectasis without acute exacerbation   Chest pain   Chronic combined systolic and diastolic CHF (congestive heart failure)   I have seen, examined  and evaluated the patient this PM along with Mr. Curly [place], [place].  After reviewing all the available data and chart,  I agree with his findings, examination as well as impression recommendations.  The patient is a very pleasant 81 year old woman with single kidney and baseline creatinine somewhere between 1.7 and 2. She has known CAD status post 2 vessel CABG 2010. Abnormal Myoview treated medically from June 2013. She also has ischemic myopathy with EF of roughly 3035% pre-and she's had 5 emergency/911 calls with this being her third admission in the last couple months. Each has been for what sounds like combined systolic/diastolic heart failure exacerbation.  Each time she has been diuresed and discharged only to bounce back again. This morning however her symptom was more consistent with a chest heaviness and pressure associated with orthopnea and PND. She was just seen in the clinic yesterday and was doing relatively well and stable post discharge with no PND or orthopnea symptoms. She was placed on monitor to evaluate palpitations which did occur this morning, but were not found to be anything significant on the monitor.  At this point I am concerned that her recurrent symptoms are related to ischemic diastolic heart failure and now with chest pain at rest concerning for unstable angina. She's had multiple admissions, and finally now needs to have some type of definitive answer. We spent a long time discussing potential options and I think the best course of action would be to proceed with cardiac catheterization. We have had nephrology also consult as far as her kidney CKD.  The best way to answer the question would be to proceed with diagnostic right left heart catheterization. I will tentatively schedule catheterization for tomorrow afternoon which will also start IV hydration tomorrow morning and 50 4 AM. Is artery received IV and by mouth Lasix today with a brisk urine output.   Leonie Man,  M.D., M.S. Interventional Cardiologist   Pager # [phone]

## 2014-05-31 ENCOUNTER — Telehealth: Payer: Self-pay | Admitting: Cardiology

## 2014-05-31 DIAGNOSIS — K921 Melena: Secondary | ICD-10-CM

## 2014-05-31 DIAGNOSIS — I255 Ischemic cardiomyopathy: Secondary | ICD-10-CM | POA: Diagnosis not present

## 2014-05-31 DIAGNOSIS — K625 Hemorrhage of anus and rectum: Secondary | ICD-10-CM | POA: Diagnosis not present

## 2014-05-31 DIAGNOSIS — I25110 Atherosclerotic heart disease of native coronary artery with unstable angina pectoris: Secondary | ICD-10-CM | POA: Diagnosis present

## 2014-05-31 DIAGNOSIS — I5042 Chronic combined systolic (congestive) and diastolic (congestive) heart failure: Secondary | ICD-10-CM | POA: Diagnosis not present

## 2014-05-31 DIAGNOSIS — K648 Other hemorrhoids: Secondary | ICD-10-CM | POA: Diagnosis not present

## 2014-05-31 DIAGNOSIS — I200 Unstable angina: Secondary | ICD-10-CM | POA: Diagnosis not present

## 2014-05-31 LAB — COMPREHENSIVE METABOLIC PANEL
ALT: 9 U/L — ABNORMAL LOW (ref 14–54)
ANION GAP: 8 (ref 5–15)
AST: 21 U/L (ref 15–41)
Albumin: 3.1 g/dL — ABNORMAL LOW (ref 3.5–5.0)
Alkaline Phosphatase: 43 U/L (ref 38–126)
BUN: 22 mg/dL — ABNORMAL HIGH (ref 6–20)
CO2: 27 mmol/L (ref 22–32)
CREATININE: 1.66 mg/dL — AB (ref 0.44–1.00)
Calcium: 8.7 mg/dL — ABNORMAL LOW (ref 8.9–10.3)
Chloride: 103 mmol/L (ref 101–111)
GFR calc Af Amer: 31 mL/min — ABNORMAL LOW (ref >60)
GFR calc non Af Amer: 27 mL/min — ABNORMAL LOW (ref >60)
Glucose, Bld: 74 mg/dL (ref 65–99)
Potassium: 3.9 mmol/L (ref 3.5–5.1)
Sodium: 138 mmol/L (ref 135–145)
TOTAL PROTEIN: 5.6 g/dL — AB (ref 6.5–8.1)
Total Bilirubin: 0.9 mg/dL (ref 0.3–1.2)

## 2014-05-31 LAB — CBC
HCT: 35 % — ABNORMAL LOW (ref 36.0–46.0)
HCT: 36.7 % (ref 36.0–46.0)
Hemoglobin: 11.2 g/dL — ABNORMAL LOW (ref 12.0–15.0)
Hemoglobin: 11.7 g/dL — ABNORMAL LOW (ref 12.0–15.0)
MCH: 28.7 pg (ref 26.0–34.0)
MCH: 28.7 pg (ref 26.0–34.0)
MCHC: 32 g/dL (ref 30.0–36.0)
MCHC: 31.9 g/dL (ref 30.0–36.0)
MCV: 89.7 fL (ref 78.0–100.0)
MCV: 90 fL (ref 78.0–100.0)
Platelets: 383 10*3/uL (ref 150–400)
Platelets: 448 10*3/uL — ABNORMAL HIGH (ref 150–400)
RBC: 3.9 MIL/uL (ref 3.87–5.11)
RBC: 4.08 MIL/uL (ref 3.87–5.11)
RDW: 14.4 % (ref 11.5–15.5)
RDW: 14.4 % (ref 11.5–15.5)
WBC: 5.2 10*3/uL (ref 4.0–10.5)
WBC: 6.5 10*3/uL (ref 4.0–10.5)

## 2014-05-31 LAB — PHOSPHORUS: PHOSPHORUS: 3.4 mg/dL (ref 2.5–4.6)

## 2014-05-31 MED ORDER — SODIUM CHLORIDE 0.9 % IV SOLN
INTRAVENOUS | Status: DC
  Administered 2014-06-01: 04:00:00 via INTRAVENOUS

## 2014-05-31 MED ORDER — METOPROLOL TARTRATE 25 MG PO TABS
25.0000 mg | ORAL_TABLET | Freq: Two times a day (BID) | ORAL | Status: DC
  Administered 2014-05-31: 25 mg via ORAL
  Filled 2014-05-31: qty 1

## 2014-05-31 MED ORDER — POLYETHYLENE GLYCOL 3350 17 G PO PACK: 17.0000 g | PACK | Freq: Every day | ORAL | Status: DC | PRN

## 2014-05-31 MED ORDER — ASPIRIN 81 MG PO CHEW
81.0000 mg | CHEWABLE_TABLET | ORAL | Status: AC
  Administered 2014-06-01: 81 mg via ORAL
  Filled 2014-05-31: qty 1

## 2014-05-31 MED ORDER — SODIUM CHLORIDE 0.9 % IJ SOLN: 3.0000 mL | INTRAMUSCULAR | Status: DC | PRN

## 2014-05-31 MED ORDER — HYDROCORTISONE ACETATE 25 MG RE SUPP
25.0000 mg | Freq: Two times a day (BID) | RECTAL | Status: AC
  Administered 2014-05-31 (×2): 25 mg via RECTAL
  Filled 2014-05-31 (×4): qty 1

## 2014-05-31 MED ORDER — METOPROLOL SUCCINATE ER 50 MG PO TB24
50.0000 mg | ORAL_TABLET | Freq: Every day | ORAL | Status: DC
Start: 2014-05-31 — End: 2014-06-06
  Administered 2014-05-31 – 2014-06-06 (×7): 50 mg via ORAL
  Filled 2014-05-31 (×7): qty 1

## 2014-05-31 MED ORDER — SODIUM CHLORIDE 0.9 % IV SOLN: 250.0000 mL | INTRAVENOUS | Status: DC | PRN

## 2014-05-31 MED ORDER — ZOLPIDEM TARTRATE 5 MG PO TABS
5.0000 mg | ORAL_TABLET | Freq: Every evening | ORAL | Status: DC | PRN
  Administered 2014-05-31: 5 mg via ORAL
  Filled 2014-05-31: qty 1

## 2014-05-31 MED ORDER — ALBUTEROL SULFATE (2.5 MG/3ML) 0.083% IN NEBU
5.0000 mg | INHALATION_SOLUTION | Freq: Once | RESPIRATORY_TRACT | Status: DC
  Filled 2014-05-31: qty 6

## 2014-05-31 MED ORDER — SODIUM CHLORIDE 0.9 % IJ SOLN
3.0000 mL | Freq: Two times a day (BID) | INTRAMUSCULAR | Status: DC
  Administered 2014-05-31 – 2014-06-03 (×5): 3 mL via INTRAVENOUS

## 2014-05-31 MED FILL — Lidocaine HCl Local Preservative Free (PF) Inj 1%: INTRAMUSCULAR | Qty: 30 | Status: AC

## 2014-05-31 MED FILL — Heparin Sodium (Porcine) 2 Unit/ML in Sodium Chloride 0.9%: INTRAMUSCULAR | Qty: 1500 | Status: AC

## 2014-05-31 NOTE — Progress Notes (Addendum)
[facility] Progress Note   [name] YYT:[id number] DOB: [birth year] DOA: 05/29/2014 PCP: Leonard Downing, MD  Brief narrative: [name] is a 81 y.o. female with a history of ischemic cardiomyopathy with EF 30-35%, one artery disease with RCA BMS in July 2010, hypertension, COPD, bronchiectasis, hyperlipidemia presents with chest pressure that began at 5 AM on the morning of admission.    Subjective: Patient was evaluated this morning-she had no complaints of chest pain, pressure shortness of breath cough nausea vomiting or diarrhea. I was later called by the nurse who told me that the patient had gotten up to go to the bathroom and was noted to have rectal bleeding. The nurses evaluated her and noted that she had something protruding from her anus which appear to be intestine--she was losing blood from this mass at that time. When she laid in bed the protruding mass retracted back up to the rectum.  Assessment/Plan: Principal Problem:   Chest pain-- - CAD- RCA BMS July 2010, CABG x 2 and AO root Sept 2010 - Troponin noted to be mildly elevated at 0.05- EKG did not reveal any acute findings -Left and right heart cath performed on 5/18 reveals 3 vessel obstructive coronary artery disease-see full report below-cardiology recommends a staged PCI of the proximal RCA and the first diagonal which will be done tomorrow -Continue aspirin and Plavix- she was not on Plavix prior to this admission  Active Problems:  Rectal bleed/possible rectal prolapse versus hemorrhoid - I have requested a GI consult and spoken with the GI PA who will evaluate her later today -I further explained to the nurse if rectal bleeding is significant, to make sure that she alerts me- -noted to have a mild drop in hemoglobin today when compared to yesterday-will recheck hemoglobin later this afternoon -Due to above-mentioned cardiac issues will need to continue aspirin and Plavix for now-also  on heparin for DVT prophylaxis    Dyslipidemia -Continue statin    Essential hypertension -Continue amlodipine, hydralazine, Imdur, Lopressor, Lasix    Chronic combined systolic and diastolic heart failure, NYHA class 2- ICM-EF 30-35% echo 04/20/14,  40% by echo June 2013, 31% by Myoview --follow daily weights and I and O- she continues to be on Lasix and potassium per cardiology service    PVD - bilat ICA 60-79% -Continue aspirin and statin    Bronchiectasis without acute exacerbation -Continue albuterol inhaler    CKD (chronic kidney disease), stage IV -Nephrology following- creatinine stable after cath yesterday    Malnutrition of moderate degree -Has been started on Ensure supplements   Code Status: Full code Family Communication:  Disposition Plan: Home when stable DVT prophylaxis: Heparin Consultants: Cardiology and nephrology Procedures: Cardiac cath 5/18  Prox RCA lesion, 85% stenosed. In stent.  Prox Cx to Mid Cx lesion, 70% stenosed.  1st Mrg lesion, 80% stenosed.  Prox LAD to Mid LAD lesion, 50% stenosed.  1st Diag lesion, 90% stenosed.  Normal right heart pressures and LV filling pressures.  1. 3 vessel obstructive CAD 2. Patent LIMA graft to the LAD 3. Patent free RIMA graft from the mid LIMA to the second OM 4. Normal right heart pressures and LV filling pressures. 5. Mild AV gradient.    Antibiotics: Anti-infectives    None      Objective: Filed Weights   05/29/14 1359 05/30/14 0543 05/31/14 0512  Weight: 59.9 kg (132 lb 0.9 oz) 60 kg (132 lb 4.4 oz) 61.1 kg (134 lb 11.2 oz)  Intake/Output Summary (Last 24 hours) at 05/31/14 1208 Last data filed at 05/31/14 0820  Gross per 24 hour  Intake    240 ml  Output    900 ml  Net   -660 ml     Vitals Filed Vitals:   05/30/14 1940 05/30/14 2050 05/31/14 0512 05/31/14 1124  BP: 115/45  134/59 111/51  Pulse: 80  79   Temp:  98 F (36.7 C) 98.3 F (36.8 C) 98.4 F (36.9 C)  TempSrc:   Oral Oral Oral  Resp: 17  18 20   Height:      Weight:   61.1 kg (134 lb 11.2 oz)   SpO2: 96%  99% 97%    Exam:  General:  Pt is alert, not in acute distress  HEENT: No icterus, No thrush  Cardiovascular: regular rate and rhythm, S1/S2 No murmur  Respiratory: clear to auscultation bilaterally   Abdomen: Soft, +Bowel sounds, non tender, non distended, no guarding  MSK: No LE edema, cyanosis or clubbing  Data Reviewed: Basic Metabolic Panel:  Recent Labs Lab 05/29/14 1000 05/30/14 0342 05/30/14 1619 05/31/14 0408  NA 137 139  --  138  K 3.8 3.6  --  3.9  CL 100* 103  --  103  CO2 24 27  --  27  GLUCOSE 103* 77  --  74  BUN 24* 23*  --  22*  CREATININE 1.83* 1.82* 1.77* 1.66*  CALCIUM 9.5 8.9  --  8.7*  PHOS  --   --   --  3.4   Liver Function Tests:  Recent Labs Lab 05/31/14 0408  AST 21  ALT 9*  ALKPHOS 43  BILITOT 0.9  PROT 5.6*  ALBUMIN 3.1*   No results for input(s): LIPASE, AMYLASE in the last 168 hours. No results for input(s): AMMONIA in the last 168 hours. CBC:  Recent Labs Lab 05/29/14 1000 05/30/14 1619 05/31/14 0408  WBC 6.4 5.9 5.2  HGB 13.2 13.1 11.2*  HCT 40.3 40.9 35.0*  MCV 88.4 90.3 89.7  PLT 459* 461* 383   Cardiac Enzymes:  Recent Labs Lab 05/29/14 1000 05/29/14 1546 05/29/14 2205  TROPONINI 0.03 0.03 0.05*   BNP (last 3 results)  Recent Labs  05/05/14 1935 05/22/14 0409 05/29/14 1000  BNP 949.1* 801.8* 851.1*    ProBNP (last 3 results) No results for input(s): PROBNP in the last 8760 hours.  CBG: No results for input(s): GLUCAP in the last 168 hours.  No results found for this or any previous visit (from the past 240 hour(s)).   Studies: No results found.  Scheduled Meds:  Scheduled Meds: . amLODipine  10 mg Oral Daily  . aspirin EC  81 mg Oral Daily  . clopidogrel  75 mg Oral Daily  . furosemide  40 mg Oral Daily  . heparin  5,000 Units Subcutaneous 3 times per day  . isosorbide mononitrate  30  mg Oral Daily  . metoprolol succinate  50 mg Oral Daily  . potassium chloride SA  10 mEq Oral Daily  . pravastatin  80 mg Oral q1800  . sodium chloride  3 mL Intravenous Q12H  . sodium chloride  3 mL Intravenous Q12H   Continuous Infusions:    Time spent on care of this patient: 35 minutes   [place], MD 05/31/2014, 12:08 PM    [facility] Office  [phone] Pager - Text Page per [email or web address]  If 7PM-7AM, please contact night-coverage [email or web address]

## 2014-05-31 NOTE — Progress Notes (Signed)
Nutrition Brief Note:  Received phone call from RN stating that ensure was causing pt some stomach upset. Will d/c supplement and offer Kozy [place] pudding from unit floor stock TID between meals.   [place], [place], [place] Pager [phone] After Hours Pager

## 2014-05-31 NOTE — Progress Notes (Signed)
Pharmacist Heart Failure Core Measure Documentation  Assessment: [name] has an EF documented as 30-35% on 04/20/14 by 2D ECHO.  Rationale: Heart failure patients with left ventricular systolic dysfunction (LVSD) and an EF < 40% should be prescribed an angiotensin converting enzyme inhibitor (ACEI) or angiotensin receptor blocker (ARB) at discharge unless a contraindication is documented in the medical record.  This patient is not currently on an ACEI or ARB for HF.  This note is being placed in the record in order to provide documentation that a contraindication to the use of these agents is present for this encounter.  ACE Inhibitor or Angiotensin Receptor Blocker is contraindicated (specify all that apply)  []   ACEI allergy AND ARB allergy []   Angioedema []   Moderate or severe aortic stenosis []   Hyperkalemia []   Hypotension []   Renal artery stenosis [x]   Worsening renal function, preexisting renal disease or dysfunction  Levester Fresh, PharmD, BCPS Clinical Pharmacist 5/19/20168:47 AM

## 2014-05-31 NOTE — Telephone Encounter (Signed)
Spoke with vickie, aware to keep the monitor and reapply once the patient gets out of the hospital

## 2014-05-31 NOTE — Progress Notes (Signed)
Called into pt room with complaint of rectal bleeding while having a bowl movement. Upon assessment I noted anatomy protruding from pts rectum and bloody.  Cleaned pt up and returned her to bed.  When pt sat down, anatomy object returned inside pt rectum.  Dr. Wynelle Cleveland notified and orders received to make pt stepdown and closely monitor.  Will continue to closely monitor.

## 2014-05-31 NOTE — Progress Notes (Signed)
Subjective: Interval History: has no complaint , no CP or SOB.  Objective: Vital signs in last 24 hours: Temp:  [98 F (36.7 C)-98.3 F (36.8 C)] 98.3 F (36.8 C) (05/19 0512) Pulse Rate:  [0-86] 79 (05/19 0512) Resp:  [0-28] 18 (05/19 0512) BP: (101-134)/(45-75) 134/59 mmHg (05/19 0512) SpO2:  [0 %-99 %] 99 % (05/19 0512) Weight:  [61.1 kg (134 lb 11.2 oz)] 61.1 kg (134 lb 11.2 oz) (05/19 0512) Weight change: 1.199 kg (2 lb 10.3 oz)  Intake/Output from previous day: 05/18 0701 - 05/19 0700 In: -  Out: 900 [Urine:900] Intake/Output this shift:    General appearance: alert, cooperative and pale Resp: diminished breath sounds bilaterally and rales bibasilar Cardio: S1, S2 normal, systolic murmur: holosystolic 3/6, blowing at apex and Gr 2/6 SEM at ULSB GI: abdm bruit, soft, pos bs Extremities: trophic changes distally  Lab Results:  Recent Labs  05/30/14 1619 05/31/14 0408  WBC 5.9 5.2  HGB 13.1 11.2*  HCT 40.9 35.0*  PLT 461* 383   BMET:  Recent Labs  05/30/14 0342 05/30/14 1619 05/31/14 0408  NA 139  --  138  K 3.6  --  3.9  CL 103  --  103  CO2 27  --  27  GLUCOSE 77  --  74  BUN 23*  --  22*  CREATININE 1.82* 1.77* 1.66*  CALCIUM 8.9  --  8.7*   No results for input(s): PTH in the last 72 hours. Iron Studies: No results for input(s): IRON, TIBC, TRANSFERRIN, FERRITIN in the last 72 hours.  Studies/Results: Dg Chest Portable 1 View  05/29/2014   CLINICAL DATA:  Chest pain.  Short of breath.  EXAM: PORTABLE CHEST - 1 VIEW  COMPARISON:  05/22/2014  FINDINGS: Advanced cardiomegaly is stable. Large hiatal hernia is stable. Small pleural effusions stable. Vascular congestion and pulmonary edema are stable. No pneumothorax.  IMPRESSION: Stable CHF.   Electronically Signed   By: Marybelle Killings M.D.   On: 05/29/2014 10:21    I have reviewed the patient's current medications.  Assessment/Plan: 1 CKD 4 stable Cr this am. Vol xs, diuresing on Lasix 2 CAD cath  with signif dz.   3 PVD 4 HTPH vit D 5 ^ lipids P lasix, would hold of PCI until Mon to allow to see how she does      Vergia Chea L 05/31/2014,8:12 AM

## 2014-05-31 NOTE — Consult Note (Signed)
[facility] Consult: 12:23 PM 05/31/2014     Referring Provider: Dr Wynelle Cleveland.   Primary Care Physician:  Leonard Downing, MD Primary Gastroenterologist:  Dr. Carlean Purl    Reason for Consultation:  Lower GIB.   HPI: [name] is a 81 y.o. female.  PMH ischemic CM with EF 30 - 35%.  Mitral regurge.  CAD, s/p BM stent in 07/2008 and CABG/aortic root replacement 09/2008, on low dose ASA at home.  Severe PAD, carotid disease. Stage 4 CKD.  Brittle htn.  COPD, bronchiectasis. Remote hemorrhoidectomy.  05/2013 Colonoscopy by Dr Carlean Purl for screening and to eval the polyp found in 04/2013  Findings: right colon and rectal polyps (tubular adenomas), diverticulosis.  No recal colon studies due to age/comorbidities.  04/2013 Flex Sigmoidoscopy: for rectal bleeding: rectal ulcers (stercoral vs enema trauma vs other) (path: benign, non-specific ulcerative and reactive changes) , large rectosigmoid polyp not removed, int/ext rrhoids. Diverticulosis. 09/2008 EGD due to melena. Distal esophageal ulcer, presumed MWT.  Moderate gastritis. Path negative for dysplasia and H pylori.   3 brief admissions in last 5 weeks with CHF. 30 day event monitor placed 5/16 to assess c/o palpitations.    Admitted 3 days ago with chest pressure/USAP/heart failure.  Peak Troponin 0.5. Diagnostic cath 5/18: 3 vessel CAD, including instent RCA stenosis.  Staged PCI to RCA planned but after cleared by renal.  Started on Plavix yesterday.    Today is passing blood per rectum and RN thought she had rectal/hemorrhoidal prolapse when standing, it resolved after she got back to bed. Patient has chronic constipation which she treats with juice.  She is taking MiraLAX before but it doesn't work. This morning she strained a little bit to have a formed  stool and saw blood when she wiped and blood mixed in with increase stool in the commode water. She then drank some prune juice and couple of hours later had soft, brown stool and did not see any blood. Hgb is 11.2 from 14 to 16 in 04/2014 and ~ 13 one week ago.    Past Medical History  Diagnosis Date  . CAD (coronary artery disease)     a. 07/2008 Inf STEMI->RCA BMS;  b. 09/2008 CABGx2: LIMA->LAD, RIMA->OM as Y from LIMA;  b. 06/2011 MV EF 31%, bsaal to dist ant infarct, inf/inflat infarct with mild to mod peri-infarct ischemia [facility]).  . Ischemic cardiomyopathy     a. 06/2011 Echo: EF 40%, inf/inflat AK and lat HK, aortic sclerosis w/ mild AI, Mild to mod MR (prev mod-sev), Gr 1 DD, mild TR, RVSP 50mmHg  . Systolic CHF, chronic     a. 06/2011 EF 40%  . Hypertension   . Hyperlipidemia   . Hiatal hernia     large  . Tobacco abuse   . Cerebrovascular disease   . COPD (chronic obstructive pulmonary disease)     presumed   . Arterial disease     peripheral arterial disease with claudication - bilateral SFA disease with ABI's 0.4-0.5 bilaterally   . Mitral regurgitation  a. prev Mod-Sev, read as mild to mod by echo 06/2011 [facility]).  . CKD (chronic kidney disease), stage IV     born with 1 kidney  . Thyroid nodule 10/2012    left dominant nodule cytology:non-neoplastic goiter.   . Cataracts, both eyes     not surgical  . Personal history of colonic polyps - adenomas 05/09/2013  . Anemia   . Systolic HF (heart failure)   . Hypertensive renal disease   . Hypertensive heart disease with congestive heart failure   . Aorta aneurysm   . LVH (left ventricular hypertrophy)   . Arteriosclerotic vascular disease   . Bronchiectasis   . Atherosclerosis of abdominal aorta   . Heart murmur   . Shortness of breath dyspnea   . MI (myocardial infarction) 2010    Past Surgical History  Procedure Laterality Date  . Laprotomy for bowel obstruction    . Coronary artery bypass  graft  09/2008    aortic thoraic anuerysm resecton 2010.   Marland Kitchen Coronary stent placement  07/2008    BMS to RCA  . Flexible sigmoidoscopy N/A 05/09/2013    Procedure: FLEXIBLE SIGMOIDOSCOPY;  Surgeon: [name], MD;  Location: [place];  Service: Endoscopy;  Laterality: N/A;  with possible hemorrhoid banding use gastrosope unsdeated  . Appendectomy    . Oophorectomy      one removed in 70's  . Hemorrhoid surgery    . Colonoscopy N/A 05/12/2013    Procedure: COLONOSCOPY;  Surgeon: [name], MD;  Location: [facility];  Service: Endoscopy;  Laterality: N/A;  . Cardiac catheterization N/A 05/30/2014    Procedure: Right/Left Heart Cath and Coronary/Graft Angiography;  Surgeon: Peter M Martinique, MD;  Location: [place] [facility];  Service: Cardiovascular;  Laterality: N/A;    Prior to Admission medications   Medication Sig Start Date End Date Taking? Authorizing Provider  albuterol (PROVENTIL HFA;VENTOLIN HFA) 108 (90 BASE) MCG/ACT inhaler Inhale 2 puffs into the lungs every 4 (four) hours as needed for wheezing or shortness of breath.    Yes Historical Provider, MD  amLODipine (NORVASC) 10 MG tablet TAKE ONE TABLET BY MOUTH ONCE DAILY 04/26/14  Yes Burnell Blanks, MD  aspirin EC 81 MG tablet Take 81 mg by mouth daily.   Yes Historical Provider, MD  Coenzyme Q-10 100 MG capsule Take 100 mg by mouth daily.     Yes Historical Provider, MD  furosemide (LASIX) 40 MG tablet Take 1 tablet (40 mg total) by mouth daily. 04/23/14  Yes Brett Canales, PA-C  hydrALAZINE (APRESOLINE) 25 MG tablet Take 1 tablet (25 mg total) by mouth 3 (three) times daily. 05/09/14  Yes Thayer Headings, MD  isosorbide mononitrate (IMDUR) 30 MG 24 hr tablet Take 1 tablet (30 mg total) by mouth daily. 05/09/14  Yes Thayer Headings, MD  metoprolol tartrate (LOPRESSOR) 25 MG tablet Take 0.5 tablets (12.5 mg total) by mouth daily. 04/22/14  Yes Brett Canales, PA-C  nitroGLYCERIN (NITROSTAT) 0.4 MG SL tablet Place 1  tablet (0.4 mg total) under the tongue every 5 (five) minutes as needed. Patient taking differently: Place 0.4 mg under the tongue every 5 (five) minutes as needed for chest pain.  04/13/14  Yes Burnell Blanks, MD  potassium chloride SA (K-DUR,KLOR-CON) 10 MEQ tablet Take 1 tablet (10 mEq total) by mouth daily. 04/22/14  Yes Brett Canales, PA-C  pravastatin (PRAVACHOL) 80 MG tablet TAKE ONE TABLET BY MOUTH ONCE DAILY IN  THE EVENING Patient taking differently: TAKE ONE TABLET BY MOUTH ONCE DAILY IN THE afternoon 07/21/13  Yes Burnell Blanks, MD    Scheduled Meds: . amLODipine  10 mg Oral Daily  . aspirin EC  81 mg Oral Daily  . clopidogrel  75 mg Oral Daily  . furosemide  40 mg Oral Daily  . heparin  5,000 Units Subcutaneous 3 times per day  . isosorbide mononitrate  30 mg Oral Daily  . metoprolol succinate  50 mg Oral Daily  . potassium chloride SA  10 mEq Oral Daily  . pravastatin  80 mg Oral q1800  . sodium chloride  3 mL Intravenous Q12H  . sodium chloride  3 mL Intravenous Q12H   Infusions:   PRN Meds: sodium chloride, acetaminophen **OR** acetaminophen, albuterol, nitroGLYCERIN, ondansetron **OR** ondansetron (ZOFRAN) IV, polyethylene glycol, sodium chloride   Allergies as of 05/29/2014  . (No Known Allergies)    Family History  Problem Relation Age of Onset  . Coronary artery disease Mother     extensive family history; several other family members.  . Heart disease Mother     CHF  . Coronary artery disease Brother   . Leukemia Sister   . Leukemia Sister   . Leukemia Father   . Colon cancer Neg Hx   . Pancreatic cancer Neg Hx   . Stomach cancer Neg Hx   . Rectal cancer Neg Hx   . Kidney cancer Brother   . Heart failure Mother   . Heart attack Sister   . Stroke Neg Hx     History   Social History  . Marital Status: Widowed    Spouse Name: N/A  . Number of Children: Y  . Years of Education: N/A   Occupational History  . 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    Social History Main Topics  . Smoking status: Former Smoker -- 1.00 packs/day for 60 years    Types: Cigarettes    Quit date: 07/12/2008  . Smokeless tobacco: Never Used  . Alcohol Use: No  . Drug Use: No  . Sexual Activity: Not on file   Other Topics Concern  . Not on file   Social History Narrative   She lives in [place]. She is retired and widowed.     REVIEW OF SYSTEMS: Constitutional:  Weight is stable. Stamina is fair although she doesn't exert herself much. ENT:  No nose bleeds Pulm:  No acute dyspnea. Stable dyspnea on exertion. No cough. CV:  No palpitations, no LE edema.  GU:  No hematuria, no frequency GI:  Per HPI Heme:  Per HPI   Transfusions:  None Neuro:  No headaches, no peripheral tingling or numbness Derm:  No itching, no rash or sores.  Endocrine:  No sweats or chills.  No polyuria or dysuria Immunization:  Not queried. Travel:  None beyond local counties in last few months.    PHYSICAL EXAM: Vital signs in last 24 hours: Filed Vitals:   05/31/14 1124  BP: 111/51  Pulse:   Temp: 98.4 F (36.9 C)  Resp: 20   Wt Readings from Last 3 Encounters:  05/31/14 134 lb 11.2 oz (61.1 kg)  05/28/14 134 lb 9.6 oz (61.054 kg)  05/23/14 135 lb 1.7 oz (61.283 kg)    General: Pleasant, elderly WF. Looks well considering her history and age. Head:  No facial swelling or asymmetry.  Eyes:  No scleral icterus. No conjunctival pallor. Ears:  Hearing intact  Nose:  Congestion or nasal discharge. Mouth:  Clear moist. Upper dentures in place. Tongue midline. Neck:  No mass, no TMG. No JVD. Lungs:  Crackles in bases bilaterally. No dyspnea or cough. Heart: RRR. 2 to 3/6 systolic murmur. S1/S2 audible. Abdomen:  Soft, NT, ND. No masses, no HSM. No bruits.  Bruising of the lower abdomen consistent with patient injections..   Rectal: Visible, small external hemorrhoids. No internal hemorrhoids or masses palpated. Stool is  soft, medium brown. No visible blood on exam glove. Did not submit some stool to FOBT testing.   Musc/Skeltl: Arthritic changes in the hands, fingers. Spinal kyphosis. Extremities:  No pedal edema. Feet are warm.  Neurologic:  Patient is oriented 3 and fully alert. Moves all 4 limbs. No tremor. No gross deficits. Skin:  No rashes, no telangiectasias. Tattoos:  None. Nodes:  No cervical adenopathy.   Psych:  Pleasant, cooperative, in good spirits, relaxed.  Intake/Output from previous day: 05/18 0701 - 05/19 0700 In: -  Out: 900 [Urine:900] Intake/Output this shift: Total I/O In: 240 [P.O.:240] Out: -   LAB RESULTS:  Recent Labs  05/29/14 1000 05/30/14 1619 05/31/14 0408  WBC 6.4 5.9 5.2  HGB 13.2 13.1 11.2*  HCT 40.3 40.9 35.0*  PLT 459* 461* 383   BMET Lab Results  Component Value Date   NA 138 05/31/2014   NA 139 05/30/2014   NA 137 05/29/2014   K 3.9 05/31/2014   K 3.6 05/30/2014   K 3.8 05/29/2014   CL 103 05/31/2014   CL 103 05/30/2014   CL 100* 05/29/2014   CO2 27 05/31/2014   CO2 27 05/30/2014   CO2 24 05/29/2014   GLUCOSE 74 05/31/2014   GLUCOSE 77 05/30/2014   GLUCOSE 103* 05/29/2014   BUN 22* 05/31/2014   BUN 23* 05/30/2014   BUN 24* 05/29/2014   CREATININE 1.66* 05/31/2014   CREATININE 1.77* 05/30/2014   CREATININE 1.82* 05/30/2014   CALCIUM 8.7* 05/31/2014   CALCIUM 8.9 05/30/2014   CALCIUM 9.5 05/29/2014   LFT  Recent Labs  05/31/14 0408  PROT 5.6*  ALBUMIN 3.1*  AST 21  ALT 9*  ALKPHOS 43  BILITOT 0.9   PT/INR Lab Results  Component Value Date   INR 1.05 05/29/2014   INR 0.98 04/19/2014   INR 1.00 09/12/2013    RADIOLOGY STUDIES: No results found.  ENDOSCOPIC STUDIES: Per HPI  IMPRESSION:   *  Self-limited rectal bleeding in the setting of Plavix therapy. Bleeding has resolved. Suspect bleeding due to hemorrhoidal source. Colonoscopy of 05/2013 showing tubular adenomas and diverticulosis. Internal and external  hemorrhoids seen on flexible sigmoidoscopy of 04/2013.  *  Coronary artery disease. Cardiology would like to pursue PCI, bare-metal stenting of RCA lesion when her renal function improves.  *  Ischemic cardiomyopathy with several recent admissions for heart failure.  *  Stage 4 CKD.      PLAN:     *  Add BID hydrocortisone suppositories for a couple of days.  *  Given her history of gastritis, wonder if we should add H2 blocker versus PPI as prophylaxis   Azucena Freed  05/31/2014, 12:23 PM Pager: [phone]  GI ATTENDING  History, laboratories, x-rays, prior endoscopy reports reviewed. Patient personally seen and examined. Agree with consultation note as outlined above. Very complicated out of the female with multiple medical problems as outlined. We are asked to see her regarding lower GI bleeding in the face of antiplatelet therapy. Is clear that her  bleeding is minor and secondary to known internal hemorrhoids. We will recommend medicated suppository therapy. Okay to continue her antiplatelet therapy. I reviewed this with her. Please call if there are questions or other needs. Will sign off. Thank you.  Docia Chuck. Geri Seminole., M.D. [facility] Division of Gastroenterology

## 2014-05-31 NOTE — Progress Notes (Signed)
Patient Name: [name] Date of Encounter: 05/31/2014  Active Problems: Principal Problem:   Unstable angina Active Problems:   CAD- RCA BMS July 2010, CABG x 2 and AO root Sept 2010   ICM-EF 30-35% echo 04/20/14,  40% by echo June 2013, 31% by Myoview   Coronary artery disease involving native coronary artery with unstable angina pectoris   Dyslipidemia   Moderate MR   Essential hypertension   Chronic combined systolic and diastolic heart failure, NYHA class 2   PVD - bilat ICA 60-79%   CKD (chronic kidney disease), stage IV   Bronchiectasis without acute exacerbation   Malnutrition of moderate degree   SUBJECTIVE Mild groin tenderness post cath Feels better. Denies chest pain, SOB or palpitation. Breathing better.    CURRENT MEDS . amLODipine  10 mg Oral Daily  . aspirin EC  81 mg Oral Daily  . clopidogrel  75 mg Oral Daily  . feeding supplement (ENSURE ENLIVE)  237 mL Oral BID BM  . furosemide  40 mg Oral Daily  . heparin  5,000 Units Subcutaneous 3 times per day  . isosorbide mononitrate  30 mg Oral Daily  . metoprolol tartrate  12.5 mg Oral Once  . metoprolol tartrate  25 mg Oral BID  . potassium chloride SA  10 mEq Oral Daily  . pravastatin  80 mg Oral q1800  . sodium chloride  3 mL Intravenous Q12H  . sodium chloride  3 mL Intravenous Q12H    OBJECTIVE  Filed Vitals:   05/30/14 1748 05/30/14 1940 05/30/14 2050 05/31/14 0512  BP: 101/75 115/45  134/59  Pulse: 74 80  79  Temp:   98 F (36.7 C) 98.3 F (36.8 C)  TempSrc:   Oral Oral  Resp: 18 17  18   Height:      Weight:    61.1 kg (134 lb 11.2 oz)  SpO2: 96% 96%  99%    Intake/Output Summary (Last 24 hours) at 05/31/14 0833 Last data filed at 05/31/14 0820  Gross per 24 hour  Intake    240 ml  Output    900 ml  Net   -660 ml   Filed Weights   05/29/14 1359 05/30/14 0543 05/31/14 0512  Weight: 59.9 kg (132 lb 0.9 oz) 60 kg (132 lb 4.4 oz) 61.1 kg (134 lb 11.2 oz)    PHYSICAL  EXAM  General: Pleasant, NAD. Neuro: Alert and oriented X 3. Moves all extremities spontaneously. Psych: Normal affect. HEENT:  [place]/AT, EOMI  Neck: Supple without bruits or JVD. Lungs:  Resp regular and unlabored. mild Bibasilar crackles.  Heart: RRR no s3, s4, or M/R/G. Abdomen: Soft, non-tender, non-distended, BS + x 4.  Extremities: No clubbing, cyanosis or edema. DP/PT/Radials 2+ and equal bilaterally. R Groin stable -mildly tender, but no bruit or hematoma.  Accessory Clinical Findings  CBC  Recent Labs  05/30/14 1619 05/31/14 0408  WBC 5.9 5.2  HGB 13.1 11.2*  HCT 40.9 35.0*  MCV 90.3 89.7  PLT 461* 595   Basic Metabolic Panel  Recent Labs  05/30/14 0342 05/30/14 1619 05/31/14 0408  NA 139  --  138  K 3.6  --  3.9  CL 103  --  103  CO2 27  --  27  GLUCOSE 77  --  74  BUN 23*  --  22*  CREATININE 1.82* 1.77* 1.66*  CALCIUM 8.9  --  8.7*  PHOS  --   --  3.4  Recent Labs  05/29/14 1000 05/29/14 1546 05/29/14 2205  TROPONINI 0.03 0.03 0.05*    TELE Sinus rhythm with rate in 80s. Occasional PVCs.  Cardiac Cath 5/18:  Conclusion     Prox RCA lesion, 85% stenosed. In stent.  Prox Cx to Mid Cx lesion, 70% stenosed.  1st Mrg lesion, 80% stenosed.  Prox LAD to Mid LAD lesion, 50% stenosed.  1st Diag lesion, 90% stenosed.  Normal right heart pressures and LV filling pressures.  1. 3 vessel obstructive CAD 2. Patent LIMA graft to the LAD 3. Patent free RIMA graft from the mid LIMA to the second OM 4. Normal right heart pressures and LV filling pressures. 5. Mild AV gradient.   Recommendation: I would consider staged PCI of the proximal RCA and the first diagonal. Recommend starting P2Y12 and ASA. When cleared by Renal will bring back to lab for PCI. I would treat the first OM ostial disease medically.     ASSESSMENT AND PLAN  [name] is a 81 y.o. female with a history of CAD, ischemic cardiomyopathy, chronic systolic CHF, HTN,  HLD, tobacco abuse, COPD, CKD, PAD, and mitral regurgitation presented 5/17 with unstable angina. -- S/p CATH 5/18 - noted to have severe RCA & Diag disease with patent Y LIMA-RIMA graft  1. Unstable angina -  Presented with SSCP CP/pressure relieved by NTG. Currently asymptomatic. ECG with no acute ST or TW change. Peak of trop is 0.05.  -- 3 V CAD with patent Graft but ungrafted RCA & D1 lesions noted - Plan Staged PCI on Friday -  IVF to start early AM 5/20 -> PCI with Dr. Martinique in the PM. - Loaded with Plavix yesterday & on 75 mg daily now. - Continue ASA. PRN nitro.   2. Chronic systolic and diastolic CHF/ischemic cardiomyopathy - normal RHC pressures with normal PCWP indicating adequate diuresis. -04/20/2014 EF 85-88%, grade 2 diastolic dysfunction, severe LAE; severe MR (probably ischemia mediated); mild to moderate AS; mild AI and TR; moderately elevated pulmonary pressure. - continue home dose of PO furosemide -  lopressor increased to 25mg  BID for high rate (with low EF - will try to convert to Toprol on d/c). Rate is stable. Nitro PRN.  -- Continue hydralazine/Imdur.  3.Hypertension - As above. BP relatively stable..   4. Palpitation - likely PVCs. - on BB. - Palpitation improve overnight.   5. CKD - IV - single kidney and baseline creatinine somewhere between 1.7 and 2. Nephrology following. Appreciate their prompt f/u.  6. COPD - per IM  7. Hyperlipidemia -Continue statin  8. PVD - bilat ICA 60-79% -Dopplers checked during her admission April 2016- no change, no symptoms   Occasional PVCs noted on telemetry and no arrhythmias noted on event monitor prior to arrival. Her presenting symptoms are probably more consistent with unstable angina, the plan was for her to proceed with cardiac catheterization today.  I actually talked to Dr. Martinique later after the cardiac catheterization that revealed significant RCA and diagonal lesions that will be planned to undergo  staged PCI on Friday with Dr. Martinique provided stability of renal function.  For now would continue current medications for blood pressure as an cholesterol. She will receive precath hydration similar to this morning for staged PCI on Friday.   Leonie Man, M.D., M.S. Interventional Cardiologist   Pager # [phone]

## 2014-05-31 NOTE — Telephone Encounter (Signed)
Pt had monitor put on Monday,now she is in the hospital.She just wanted you to know this,will have stents put in on Friday.

## 2014-05-31 NOTE — Progress Notes (Signed)
Pt  resting in bed and talking to family and C/O SOB. 02 sat dropped down to 88% on 3L of 02 via nasal cannula and increased to 4L, O2sats sustaining at 92-94%. Expiratory wheezes heard in right upper lung upon ausculation. Np on call made aware. Order received to give breathing treatment. However, when attempting to give pt the breathing treatment pt  refused and said she felt fine. 02 sat sustaining at  95% on 4L of 02 via nasal canula. Will cont to monitor pt.

## 2014-06-01 ENCOUNTER — Observation Stay (HOSPITAL_COMMUNITY): Payer: Medicare Other

## 2014-06-01 DIAGNOSIS — K625 Hemorrhage of anus and rectum: Secondary | ICD-10-CM

## 2014-06-01 DIAGNOSIS — K644 Residual hemorrhoidal skin tags: Secondary | ICD-10-CM | POA: Diagnosis present

## 2014-06-01 DIAGNOSIS — R079 Chest pain, unspecified: Secondary | ICD-10-CM | POA: Diagnosis present

## 2014-06-01 DIAGNOSIS — E440 Moderate protein-calorie malnutrition: Secondary | ICD-10-CM | POA: Diagnosis present

## 2014-06-01 DIAGNOSIS — I25119 Atherosclerotic heart disease of native coronary artery with unspecified angina pectoris: Secondary | ICD-10-CM | POA: Diagnosis not present

## 2014-06-01 DIAGNOSIS — I5042 Chronic combined systolic (congestive) and diastolic (congestive) heart failure: Secondary | ICD-10-CM | POA: Diagnosis not present

## 2014-06-01 DIAGNOSIS — Z87891 Personal history of nicotine dependence: Secondary | ICD-10-CM | POA: Diagnosis not present

## 2014-06-01 DIAGNOSIS — Z951 Presence of aortocoronary bypass graft: Secondary | ICD-10-CM | POA: Diagnosis not present

## 2014-06-01 DIAGNOSIS — I25110 Atherosclerotic heart disease of native coronary artery with unstable angina pectoris: Secondary | ICD-10-CM | POA: Diagnosis present

## 2014-06-01 DIAGNOSIS — K922 Gastrointestinal hemorrhage, unspecified: Secondary | ICD-10-CM | POA: Diagnosis present

## 2014-06-01 DIAGNOSIS — T82857A Stenosis of cardiac prosthetic devices, implants and grafts, initial encounter: Secondary | ICD-10-CM | POA: Diagnosis present

## 2014-06-01 DIAGNOSIS — I255 Ischemic cardiomyopathy: Secondary | ICD-10-CM | POA: Diagnosis present

## 2014-06-01 DIAGNOSIS — I5043 Acute on chronic combined systolic (congestive) and diastolic (congestive) heart failure: Secondary | ICD-10-CM | POA: Diagnosis present

## 2014-06-01 DIAGNOSIS — I340 Nonrheumatic mitral (valve) insufficiency: Secondary | ICD-10-CM | POA: Diagnosis not present

## 2014-06-01 DIAGNOSIS — J479 Bronchiectasis, uncomplicated: Secondary | ICD-10-CM | POA: Diagnosis not present

## 2014-06-01 DIAGNOSIS — I447 Left bundle-branch block, unspecified: Secondary | ICD-10-CM | POA: Diagnosis present

## 2014-06-01 DIAGNOSIS — N184 Chronic kidney disease, stage 4 (severe): Secondary | ICD-10-CM | POA: Diagnosis present

## 2014-06-01 DIAGNOSIS — Q600 Renal agenesis, unilateral: Secondary | ICD-10-CM | POA: Diagnosis not present

## 2014-06-01 DIAGNOSIS — I130 Hypertensive heart and chronic kidney disease with heart failure and stage 1 through stage 4 chronic kidney disease, or unspecified chronic kidney disease: Secondary | ICD-10-CM | POA: Diagnosis present

## 2014-06-01 DIAGNOSIS — Z6821 Body mass index (BMI) 21.0-21.9, adult: Secondary | ICD-10-CM | POA: Diagnosis not present

## 2014-06-01 DIAGNOSIS — E041 Nontoxic single thyroid nodule: Secondary | ICD-10-CM | POA: Diagnosis present

## 2014-06-01 DIAGNOSIS — K623 Rectal prolapse: Secondary | ICD-10-CM | POA: Diagnosis present

## 2014-06-01 DIAGNOSIS — J9601 Acute respiratory failure with hypoxia: Secondary | ICD-10-CM

## 2014-06-01 DIAGNOSIS — I200 Unstable angina: Secondary | ICD-10-CM | POA: Diagnosis not present

## 2014-06-01 DIAGNOSIS — J449 Chronic obstructive pulmonary disease, unspecified: Secondary | ICD-10-CM | POA: Diagnosis present

## 2014-06-01 DIAGNOSIS — Z79899 Other long term (current) drug therapy: Secondary | ICD-10-CM | POA: Diagnosis not present

## 2014-06-01 DIAGNOSIS — Z7982 Long term (current) use of aspirin: Secondary | ICD-10-CM | POA: Diagnosis not present

## 2014-06-01 DIAGNOSIS — I252 Old myocardial infarction: Secondary | ICD-10-CM | POA: Diagnosis not present

## 2014-06-01 DIAGNOSIS — E785 Hyperlipidemia, unspecified: Secondary | ICD-10-CM | POA: Diagnosis not present

## 2014-06-01 DIAGNOSIS — D649 Anemia, unspecified: Secondary | ICD-10-CM | POA: Diagnosis present

## 2014-06-01 DIAGNOSIS — I080 Rheumatic disorders of both mitral and aortic valves: Secondary | ICD-10-CM | POA: Diagnosis not present

## 2014-06-01 DIAGNOSIS — I739 Peripheral vascular disease, unspecified: Secondary | ICD-10-CM | POA: Diagnosis present

## 2014-06-01 LAB — CBC
HCT: 36.3 % (ref 36.0–46.0)
Hemoglobin: 11.6 g/dL — ABNORMAL LOW (ref 12.0–15.0)
MCH: 29.1 pg (ref 26.0–34.0)
MCHC: 32 g/dL (ref 30.0–36.0)
MCV: 91 fL (ref 78.0–100.0)
PLATELETS: 416 10*3/uL — AB (ref 150–400)
RBC: 3.99 MIL/uL (ref 3.87–5.11)
RDW: 14.4 % (ref 11.5–15.5)
WBC: 6.8 10*3/uL (ref 4.0–10.5)

## 2014-06-01 LAB — RENAL FUNCTION PANEL
ALBUMIN: 3.1 g/dL — AB (ref 3.5–5.0)
ANION GAP: 8 (ref 5–15)
BUN: 21 mg/dL — ABNORMAL HIGH (ref 6–20)
CHLORIDE: 102 mmol/L (ref 101–111)
CO2: 28 mmol/L (ref 22–32)
Calcium: 9.1 mg/dL (ref 8.9–10.3)
Creatinine, Ser: 1.92 mg/dL — ABNORMAL HIGH (ref 0.44–1.00)
GFR calc Af Amer: 26 mL/min — ABNORMAL LOW (ref >60)
GFR calc non Af Amer: 23 mL/min — ABNORMAL LOW (ref >60)
GLUCOSE: 82 mg/dL (ref 65–99)
PHOSPHORUS: 3.8 mg/dL (ref 2.5–4.6)
POTASSIUM: 4.1 mmol/L (ref 3.5–5.1)
SODIUM: 138 mmol/L (ref 135–145)

## 2014-06-01 LAB — TROPONIN I: Troponin I: 0.13 ng/mL — ABNORMAL HIGH (ref <0.031)

## 2014-06-01 MED ORDER — FUROSEMIDE 10 MG/ML IJ SOLN
80.0000 mg | Freq: Once | INTRAMUSCULAR | Status: AC
  Administered 2014-06-01: 80 mg via INTRAVENOUS
  Filled 2014-06-01: qty 8

## 2014-06-01 MED ORDER — FUROSEMIDE 10 MG/ML IJ SOLN
INTRAMUSCULAR | Status: AC
  Administered 2014-06-01: 40 mg via INTRAVENOUS
  Filled 2014-06-01: qty 4

## 2014-06-01 MED ORDER — ISOSORBIDE MONONITRATE ER 60 MG PO TB24
60.0000 mg | ORAL_TABLET | Freq: Every day | ORAL | Status: DC
  Administered 2014-06-02 – 2014-06-06 (×5): 60 mg via ORAL
  Filled 2014-06-01 (×5): qty 1

## 2014-06-01 MED ORDER — FUROSEMIDE 10 MG/ML IJ SOLN
40.0000 mg | Freq: Once | INTRAMUSCULAR | Status: AC
  Administered 2014-06-01: 40 mg via INTRAVENOUS

## 2014-06-01 MED ORDER — AMLODIPINE BESYLATE 2.5 MG PO TABS
2.5000 mg | ORAL_TABLET | Freq: Every day | ORAL | Status: DC
  Administered 2014-06-01: 2.5 mg via ORAL
  Filled 2014-06-01 (×2): qty 1

## 2014-06-01 MED ORDER — LEVALBUTEROL HCL 0.63 MG/3ML IN NEBU
0.6300 mg | INHALATION_SOLUTION | Freq: Four times a day (QID) | RESPIRATORY_TRACT | Status: DC | PRN
  Administered 2014-06-05: 0.63 mg via RESPIRATORY_TRACT
  Filled 2014-06-01 (×2): qty 3

## 2014-06-01 NOTE — Progress Notes (Signed)
Patient more short of breath this afternoon, only diuresed 300 cc from IV lasix this morning. Bladder scanned patient with no urine seen.  [name] PA paged and notified. Orders received. 80mg  IV lasix given per orders with 350 cc urine output. Neb treatment given per respiratory therapist with some relief of shortness of breath.  [name]

## 2014-06-01 NOTE — Progress Notes (Signed)
Pt went to the bathroom and when she got back into bed she c/o 8/10 CP that lasted for about 5 minutes and resolved on it's own. EKG obtained. Cardiologist, Dr. Enid Derry made aware.

## 2014-06-01 NOTE — Progress Notes (Signed)
Patient with increasing shortness of breath this morning.  Oxygen saturations 87% 4 liters and crackles in bilateral bases.  Oxygen increased to 5 liters [place] with sats increasing to 91%.  Dr. Ellyn Hack paged and notified, orders received for IV lasix 40mg .  Lasix given and patient states she feels some better.  Sanda Linger

## 2014-06-01 NOTE — Progress Notes (Signed)
Subjective: Interval History: has no complaint, but had CP.  Objective: Vital signs in last 24 hours: Temp:  [98 F (36.7 C)-98.7 F (37.1 C)] 98.4 F (36.9 C) (05/20 0505) Pulse Rate:  [73-84] 73 (05/20 0505) Resp:  [18-22] 18 (05/20 0505) BP: (102-113)/(50-58) 108/57 mmHg (05/20 0505) SpO2:  [88 %-97 %] 95 % (05/20 0505) Weight change:   Intake/Output from previous day: 05/19 0701 - 05/20 0700 In: 700 [P.O.:600; I.V.:100] Out: 700 [Urine:700] Intake/Output this shift:    General appearance: alert, cooperative, no distress and pale Resp: rales bibasilar Cardio: S1, S2 normal and systolic murmur: Gr 3/6 holosys at apes, Gr 2/6 SEM at ULSB 3/6, blowing at apex GI: pos bs,soft, liver down 5 cm Extremities: trophic changes distally, bilat fem bruits  Lab Results:  Recent Labs  05/31/14 1503 06/01/14 0303  WBC 6.5 6.8  HGB 11.7* 11.6*  HCT 36.7 36.3  PLT 448* 416*   BMET:  Recent Labs  05/31/14 0408 06/01/14 0302  NA 138 138  K 3.9 4.1  CL 103 102  CO2 27 28  GLUCOSE 74 82  BUN 22* 21*  CREATININE 1.66* 1.92*  CALCIUM 8.7* 9.1   No results for input(s): PTH in the last 72 hours. Iron Studies: No results for input(s): IRON, TIBC, TRANSFERRIN, FERRITIN in the last 72 hours.  Studies/Results: No results found.  I have reviewed the patient's current medications.  Assessment/Plan: 1 CKD  Cr^, borderline statistical signif.  Suspect rise could be lower bp,but, would rec hold of cath until Mon. 2 HTN too low, lower amlod 3 CHF diruesing 4 CAD concern of CP last eve 5 Anemia lower despite less vol.  Clearly loss 6 PVD P lower amlod,discussed with Dr. Ellyn Hack.  Follow Cr      Karolyna Bianchini L 06/01/2014,8:21 AM

## 2014-06-01 NOTE — Progress Notes (Signed)
Patient Name: [name] Date of Encounter: 06/01/2014  Active Problems: Principal Problem:   Unstable angina Active Problems:   CAD- RCA BMS July 2010, CABG x 2 and AO root Sept 2010   ICM-EF 30-35% echo 04/20/14,  40% by echo June 2013, 31% by Myoview   Coronary artery disease involving native coronary artery with unstable angina pectoris   Dyslipidemia   Moderate MR   Essential hypertension   Chronic combined systolic and diastolic heart failure, NYHA class 2   PVD - bilat ICA 60-79%   CKD (chronic kidney disease), stage IV   Bronchiectasis without acute exacerbation   Malnutrition of moderate degree   Rectal bleeding   SUBJECTIVE Overall feels better, but did have some episodes of chest discomfort overnight and this morning. Also had some dyspnea that was likely related to her IV fluid administration.   CURRENT MEDS . albuterol  5 mg Nebulization Once  . amLODipine  2.5 mg Oral QHS  . aspirin EC  81 mg Oral Daily  . clopidogrel  75 mg Oral Daily  . furosemide  40 mg Oral Daily  . heparin  5,000 Units Subcutaneous 3 times per day  . hydrocortisone  25 mg Rectal BID  . [START ON 06/02/2014] isosorbide mononitrate  60 mg Oral Daily  . metoprolol succinate  50 mg Oral Daily  . potassium chloride SA  10 mEq Oral Daily  . pravastatin  80 mg Oral q1800  . sodium chloride  3 mL Intravenous Q12H  . sodium chloride  3 mL Intravenous Q12H  . sodium chloride  3 mL Intravenous Q12H    OBJECTIVE  Filed Vitals:   06/01/14 0000 06/01/14 0505 06/01/14 1126 06/01/14 1430  BP:  108/57 108/61 110/60  Pulse:  73 78 74  Temp:  98.4 F (36.9 C)  98.3 F (36.8 C)  TempSrc:  Oral    Resp:  18    Height:      Weight:      SpO2: 95% 95% 91% 93%    Intake/Output Summary (Last 24 hours) at 06/01/14 1700 Last data filed at 06/01/14 1330  Gross per 24 hour  Intake    660 ml  Output    700 ml  Net    -40 ml   Filed Weights   05/29/14 1359 05/30/14 0543 05/31/14 0512    Weight: 59.9 kg (132 lb 0.9 oz) 60 kg (132 lb 4.4 oz) 61.1 kg (134 lb 11.2 oz)    PHYSICAL EXAM  General: Pleasant, NAD. Neuro: Alert and oriented X 3. Moves all extremities spontaneously. Psych: Normal affect. HEENT:  [place]/AT, EOMI  Neck: Supple without bruits or JVD. Lungs:  CTA B. mild Bibasilar crackles, nonlabored.  Heart: RRR no s3, s4, 3/6 SEM at RUSB. Also noted 2/6 HSM. Abdomen: Soft, non-tender, non-distended, BS + x 4.  Extremities: No clubbing, cyanosis or edema. DP/PT/Radials 2+ and equal bilaterally. R Groin stable -mildly tender, but no bruit or hematoma.  Accessory Clinical Findings  CBC  Recent Labs  05/31/14 1503 06/01/14 0303  WBC 6.5 6.8  HGB 11.7* 11.6*  HCT 36.7 36.3  MCV 90.0 91.0  PLT 448* 675*   Basic Metabolic Panel  Recent Labs  05/31/14 0408 06/01/14 0302  NA 138 138  K 3.9 4.1  CL 103 102  CO2 27 28  GLUCOSE 74 82  BUN 22* 21*  CREATININE 1.66* 1.92*  CALCIUM 8.7* 9.1  PHOS 3.4 3.8    Recent Labs  05/29/14 2205 06/01/14 0302  TROPONINI 0.05* 0.13*    TELE Sinus rhythm with rate in 80s. Occasional PVCs.  Cardiac Cath 5/18:  Conclusion     Prox RCA lesion, 85% stenosed. In stent.  Prox Cx to Mid Cx lesion, 70% stenosed.  1st Mrg lesion, 80% stenosed.  Prox LAD to Mid LAD lesion, 50% stenosed.  1st Diag lesion, 90% stenosed.  Normal right heart pressures and LV filling pressures.  1. 3 vessel obstructive CAD 2. Patent LIMA graft to the LAD 3. Patent free RIMA graft from the mid LIMA to the second OM 4. Normal right heart pressures and LV filling pressures. 5. Mild AV gradient.   Recommendation: I would consider staged PCI of the proximal RCA and the first diagonal. Recommend starting P2Y12 and ASA. When cleared by Renal will bring back to lab for PCI. I would treat the first OM ostial disease medically.     ASSESSMENT AND PLAN  [name] is a 81 y.o. female with a history of CAD, ischemic  cardiomyopathy, chronic systolic CHF, HTN, HLD, tobacco abuse, COPD, CKD, PAD, and mitral regurgitation presented 5/17 with unstable angina. -- S/p CATH 5/18 - noted to have severe RCA & Diag disease with patent Y LIMA-RIMA graft  1. Unstable angina = with mild troponin elevation.   -- 3 V CAD with patent Graft but ungrafted RCA & D1 lesions noted - Plan Staged PCI on Friday -  Unfortunately, the creatinine level has gone up today and after discussion with nephrology, we will postpone catheterization/PCI today until Monday. This will allow for normalization of renal function. Date 2 is more likely that time to see some effects from contrast nephropathy.  - Loaded with Plavix yesterday & on 75 mg daily now. - Continue ASA. PRN nitro.   2. Chronic systolic and diastolic CHF/ischemic cardiomyopathy - normal RHC pressures with normal PCWP indicating adequate diuresis. -04/20/2014 EF 86-57%, grade 2 diastolic dysfunction, severe LAE; severe MR (probably ischemia mediated); mild to moderate AS; mild AI and TR; moderately elevated pulmonary pressure. - continue home dose of PO furosemide - but with dyspnea today will give an additional dose of 40 mg IV Lasix. -  Converted from Lopressor 25 mg twice a day to Toprol 50 mg. Rate is stable.  - Nitro PRN.  -- Continue hydralazine/Imdur.  3.Hypertension - As above. BP relatively stable..   4. Palpitation - likely PVCs. - on BB.  Also - Palpitation improve overnight.  She did have a short run of what looked like either PAT or even potentially A. fib that was noted this morning on telemetry. I think it'll be reasonable for her to go back on the event monitor upon discharge unless we see something definitively over the weekend.  5. CKD - IV - single kidney and baseline creatinine somewhere between 1.7 and 2. Nephrology following. Appreciate their prompt f/u. - Creatinine went up a little bit today to 1.9, we'll postpone PCI until Monday provided the  renal function normalizes.  6. COPD - per IM  7. Hyperlipidemia -Continue statin  8. PVD - bilat ICA 60-79% -Dopplers checked during her admission April 2016- no change, no symptoms  Plan is gentle titration of medications including diuresis over the weekend and hopefully plan PCI on Monday. Would then probably need to watch potentially a second day post catheterization to ensure renal function stabilizes. Otherwise she would need to have labs checked as outpatient after discharge.  OK on subcutaneous heparin unless chest pain  recurs. If it does we may need to consider IV heparin. I'm reluctant since she has had some mild blood in the stool that is likely related to hemorrhoids.   Leonie Man, M.D., M.S. Interventional Cardiologist   Pager # [phone]

## 2014-06-01 NOTE — Progress Notes (Addendum)
[facility] Progress Note   DAIL MEECE PZW:[id number] DOB: Jun 02, 1927 DOA: 05/29/2014 PCP: Leonard Downing, MD  Brief narrative: [name] is a 81 y.o. female with a history of ischemic cardiomyopathy with EF 30-35%, one artery disease with RCA BMS in July 2010, hypertension, COPD, bronchiectasis, hyperlipidemia presents with chest pressure that began at 5 AM on the morning of admission.   Subjective: Evaluated this morning. Had some chest pain last night he felt like a burning in her chest. Nitroglycerin did not help. Currently pain-free. No complaints of nausea. Is getting short of breath when ambulating to the bathroom. No cough no wheezing. No constipation or diarrhea.  Assessment/Plan: Principal Problem:   Chest pain--coronary artery disease - BMS to the RCA 7/10, CABG 2 and aortic root repair 9/10 - Troponin noted to be mildly elevated at 0.05- EKG did not reveal any acute findings -Left and right heart cath performed on 5/18 reveals 3 vessel obstructive coronary artery disease-see full report below-cardiology recommends a staged PCI of the proximal RCA and the first diagonal which will be done tomorrow -Continue aspirin and Plavix- she was not on Plavix prior to this admission -Continue when necessary nitroglycerin for chest pain-will also add a GI cocktail in case there is a reflux component  Active Problems:  Rectal bleed - She had one episode of rectal bleeding 5/19 with a protruding rectal mass which retracted back into the rectum once the patient was laying back in bed-this was all noted by the nursing staff- subsequent bowel movements have not had any blood - GI evaluated the patient on 5/19 and suspecting a hemorrhoidal bleed -noted to have a mild drop in hemoglobin on 5/19 when compared to prior labs-  Hb has since been stable at 11 -Due to above-mentioned cardiac issues will need to continue aspirin and Plavix for now-also on heparin for DVT  prophylaxis  Hypoxic resp failure- acute on chronic combined systolic and diastolic heart failure/ischemic cardiomyopathy - EF 30-35% echo 04/20/14 -Noted to have increasing O2 requirements and increasing dyspnea on exertion - chest x-ray performed on 5/20 reveals increasing pulmonary edema-received 40 and then another 80 mg of IV Lasix on 5/20 with good diuresis -We'll give another dose of 40 of IV Lasix now as she continues to be hypoxic -She is already receiving 40 mg of oral Lasix daily -Start incentive spirometry as well    Dyslipidemia -Continue statin    Essential hypertension -Continue amlodipine, hydralazine, Imdur, Lopressor, Lasix    PVD - bilat ICA 60-79% -Continue aspirin and statin    Bronchiectasis without acute exacerbation -Continue albuterol inhaler    CKD (chronic kidney disease), stage IV -Nephrology following    Malnutrition of moderate degree -Has been started on Ensure supplements   Code Status: Full code Family Communication:  Disposition Plan: Home when stable DVT prophylaxis: Heparin Consultants: Cardiology and nephrology Procedures: Cardiac cath 5/18  Prox RCA lesion, 85% stenosed. In stent.  Prox Cx to Mid Cx lesion, 70% stenosed.  1st Mrg lesion, 80% stenosed.  Prox LAD to Mid LAD lesion, 50% stenosed.  1st Diag lesion, 90% stenosed.  Normal right heart pressures and LV filling pressures.  1. 3 vessel obstructive CAD 2. Patent LIMA graft to the LAD 3. Patent free RIMA graft from the mid LIMA to the second OM 4. Normal right heart pressures and LV filling pressures. 5. Mild AV gradient.    Antibiotics: Anti-infectives    None      Objective: [employer]  05/29/14 1359 05/30/14 0543 05/31/14 0512  Weight: 59.9 kg (132 lb 0.9 oz) 60 kg (132 lb 4.4 oz) 61.1 kg (134 lb 11.2 oz)    Intake/Output Summary (Last 24 hours) at 06/01/14 1319 Last data filed at 06/01/14 1000  Gross per 24 hour  Intake    660 ml  Output    700  ml  Net    -40 ml     Vitals Filed Vitals:   05/31/14 2200 06/01/14 0000 06/01/14 0505 06/01/14 1126  BP:   108/57 108/61  Pulse:   73 78  Temp:   98.4 F (36.9 C)   TempSrc:   Oral   Resp:   18   Height:      Weight:      SpO2: 94% 95% 95% 91%    Exam:  General:  Pt is alert, not in acute distress  HEENT: No icterus, No thrush  Cardiovascular: regular rate and rhythm, S1/S2 No murmur  Respiratory: Crackles at bilateral bases  Abdomen: Soft, +Bowel sounds, non tender, non distended, no guarding  MSK: No LE edema, cyanosis or clubbing  Data Reviewed: Basic Metabolic Panel:  Recent Labs Lab 05/29/14 1000 05/30/14 0342 05/30/14 1619 05/31/14 0408 06/01/14 0302  NA 137 139  --  138 138  K 3.8 3.6  --  3.9 4.1  CL 100* 103  --  103 102  CO2 24 27  --  27 28  GLUCOSE 103* 77  --  74 82  BUN 24* 23*  --  22* 21*  CREATININE 1.83* 1.82* 1.77* 1.66* 1.92*  CALCIUM 9.5 8.9  --  8.7* 9.1  PHOS  --   --   --  3.4 3.8   Liver Function Tests:  Recent Labs Lab 05/31/14 0408 06/01/14 0302  AST 21  --   ALT 9*  --   ALKPHOS 43  --   BILITOT 0.9  --   PROT 5.6*  --   ALBUMIN 3.1* 3.1*   No results for input(s): LIPASE, AMYLASE in the last 168 hours. No results for input(s): AMMONIA in the last 168 hours. CBC:  Recent Labs Lab 05/29/14 1000 05/30/14 1619 05/31/14 0408 05/31/14 1503 06/01/14 0303  WBC 6.4 5.9 5.2 6.5 6.8  HGB 13.2 13.1 11.2* 11.7* 11.6*  HCT 40.3 40.9 35.0* 36.7 36.3  MCV 88.4 90.3 89.7 90.0 91.0  PLT 459* 461* 383 448* 416*   Cardiac Enzymes:  Recent Labs Lab 05/29/14 1000 05/29/14 1546 05/29/14 2205 06/01/14 0302  TROPONINI 0.03 0.03 0.05* 0.13*   BNP (last 3 results)  Recent Labs  05/05/14 1935 05/22/14 0409 05/29/14 1000  BNP 949.1* 801.8* 851.1*    ProBNP (last 3 results) No results for input(s): PROBNP in the last 8760 hours.  CBG: No results for input(s): GLUCAP in the last 168 hours.  No results found for  this or any previous visit (from the past 240 hour(s)).   Studies: No results found.  Scheduled Meds:  Scheduled Meds: . albuterol  5 mg Nebulization Once  . amLODipine  2.5 mg Oral QHS  . aspirin EC  81 mg Oral Daily  . clopidogrel  75 mg Oral Daily  . furosemide  40 mg Oral Daily  . heparin  5,000 Units Subcutaneous 3 times per day  . hydrocortisone  25 mg Rectal BID  . [START ON 06/02/2014] isosorbide mononitrate  60 mg Oral Daily  . metoprolol succinate  50 mg Oral Daily  . potassium chloride SA  10 mEq Oral Daily  . pravastatin  80 mg Oral q1800  . sodium chloride  3 mL Intravenous Q12H  . sodium chloride  3 mL Intravenous Q12H  . sodium chloride  3 mL Intravenous Q12H   Continuous Infusions:    Time spent on care of this patient: 35 minutes   C[place], MD 06/01/2014, 1:19 PM    [facility] Office  [phone] Pager - Text Page per [email or web address]  If 7PM-7AM, please contact night-coverage [email or web address]

## 2014-06-02 LAB — RENAL FUNCTION PANEL
Albumin: 2.9 g/dL — ABNORMAL LOW (ref 3.5–5.0)
Anion gap: 8 (ref 5–15)
BUN: 23 mg/dL — AB (ref 6–20)
CO2: 29 mmol/L (ref 22–32)
CREATININE: 1.87 mg/dL — AB (ref 0.44–1.00)
Calcium: 8.7 mg/dL — ABNORMAL LOW (ref 8.9–10.3)
Chloride: 99 mmol/L — ABNORMAL LOW (ref 101–111)
GFR calc non Af Amer: 23 mL/min — ABNORMAL LOW (ref >60)
GFR, EST AFRICAN AMERICAN: 27 mL/min — AB (ref >60)
Glucose, Bld: 96 mg/dL (ref 65–99)
Phosphorus: 4.1 mg/dL (ref 2.5–4.6)
Potassium: 3.8 mmol/L (ref 3.5–5.1)
Sodium: 136 mmol/L (ref 135–145)

## 2014-06-02 LAB — CBC
HCT: 35.2 % — ABNORMAL LOW (ref 36.0–46.0)
HEMOGLOBIN: 11.4 g/dL — AB (ref 12.0–15.0)
MCH: 29.2 pg (ref 26.0–34.0)
MCHC: 32.4 g/dL (ref 30.0–36.0)
MCV: 90 fL (ref 78.0–100.0)
Platelets: 411 10*3/uL — ABNORMAL HIGH (ref 150–400)
RBC: 3.91 MIL/uL (ref 3.87–5.11)
RDW: 14.5 % (ref 11.5–15.5)
WBC: 6.6 10*3/uL (ref 4.0–10.5)

## 2014-06-02 MED ORDER — GI COCKTAIL ~~LOC~~
30.0000 mL | Freq: Three times a day (TID) | ORAL | Status: DC | PRN
  Filled 2014-06-02: qty 30

## 2014-06-02 MED ORDER — FUROSEMIDE 10 MG/ML IJ SOLN
40.0000 mg | Freq: Once | INTRAMUSCULAR | Status: AC
  Administered 2014-06-02: 40 mg via INTRAVENOUS
  Filled 2014-06-02: qty 4

## 2014-06-02 NOTE — Progress Notes (Addendum)
Patient complained of "having that chest pain again" after using the bathroom. 1 nitro given, EKG obtained. Patient now resting. Cardiologist on call made aware. New orders received to go ahead and give AM imdur dose.  Will continue to monitor.

## 2014-06-02 NOTE — Progress Notes (Signed)
Patient complained of chest pain worsening from before. 1 nitro given and EKG obtained. Chest pain resolved. Will continue to monitor.

## 2014-06-02 NOTE — Progress Notes (Signed)
Patient Name: [name] Date of Encounter: 06/02/2014  Active Problems: Principal Problem:   Unstable angina Active Problems:   Dyslipidemia   Moderate MR   Essential hypertension   CAD- RCA BMS July 2010, CABG x 2 and AO root Sept 2010   Chronic combined systolic and diastolic heart failure, NYHA class 2   PVD - bilat ICA 60-79%   Bronchiectasis without acute exacerbation   COPD (chronic obstructive pulmonary disease)   CKD (chronic kidney disease), stage IV   ICM-EF 30-35% echo 04/20/14,  40% by echo June 2013, 31% by Myoview   Malnutrition of moderate degree   Coronary artery disease involving native coronary artery with unstable angina pectoris   Rectal bleeding   Acute respiratory failure with hypoxia   Chest pain   SUBJECTIVE  81 year old female with a history of ischemic cardio myopathy with an EF of 30-35%. She has a history of coronary artery disease and has recently been found have a tight stenosis P she needs to have PCI on Monday. Unfortunately her creatinine has gradually been increasing since her diagnostic catheterization several days ago. She's been having some episodes of angina.  CURRENT MEDS . albuterol  5 mg Nebulization Once  . aspirin EC  81 mg Oral Daily  . clopidogrel  75 mg Oral Daily  . furosemide  40 mg Intravenous Once  . furosemide  40 mg Oral Daily  . heparin  5,000 Units Subcutaneous 3 times per day  . isosorbide mononitrate  60 mg Oral Daily  . metoprolol succinate  50 mg Oral Daily  . potassium chloride SA  10 mEq Oral Daily  . pravastatin  80 mg Oral q1800  . sodium chloride  3 mL Intravenous Q12H  . sodium chloride  3 mL Intravenous Q12H  . sodium chloride  3 mL Intravenous Q12H    OBJECTIVE  Filed Vitals:   06/02/14 0008 06/02/14 0434 06/02/14 0757 06/02/14 0803  BP:  104/57  102/46  Pulse:  73    Temp: 98.7 F (37.1 C) 98 F (36.7 C)  97.6 F (36.4 C)  TempSrc:      Resp:      Height:      Weight:   59.512 kg  (131 lb 3.2 oz)   SpO2:  89%      Intake/Output Summary (Last 24 hours) at 06/02/14 1213 Last data filed at 06/02/14 0918  Gross per 24 hour  Intake    840 ml  Output   2200 ml  Net  -1360 ml   Filed Weights   05/30/14 0543 05/31/14 0512 06/02/14 0757  Weight: 60 kg (132 lb 4.4 oz) 61.1 kg (134 lb 11.2 oz) 59.512 kg (131 lb 3.2 oz)    PHYSICAL EXAM  General: Pleasant, NAD. Neuro: Alert and oriented X 3. Moves all extremities spontaneously. Psych: Normal affect. HEENT:  [place]/AT, EOMI  Neck: Supple without bruits or JVD. Lungs:  CTA B. mild Bibasilar crackles, nonlabored.  Heart: RRR no s3, s4, 3/6 SEM at RUSB. Also noted 2/6 HSM. Abdomen: Soft, non-tender, non-distended, BS + x 4.  Extremities: No clubbing, cyanosis or edema. DP/PT/Radials 2+ and equal bilaterally. R Groin stable -mildly tender, but no bruit or hematoma.  Accessory Clinical Findings  CBC  Recent Labs  06/01/14 0303 06/02/14 0536  WBC 6.8 6.6  HGB 11.6* 11.4*  HCT 36.3 35.2*  MCV 91.0 90.0  PLT 416* 240*   Basic Metabolic Panel  Recent Labs  06/01/14 0302 06/02/14  0536  NA 138 136  K 4.1 3.8  CL 102 99*  CO2 28 29  GLUCOSE 82 96  BUN 21* 23*  CREATININE 1.92* 1.87*  CALCIUM 9.1 8.7*  PHOS 3.8 4.1    Recent Labs  06/01/14 0302  TROPONINI 0.13*    TELE Sinus rhythm with rate in 80s. Occasional PVCs.  Cardiac Cath 5/18:  Conclusion     Prox RCA lesion, 85% stenosed. In stent.  Prox Cx to Mid Cx lesion, 70% stenosed.  1st Mrg lesion, 80% stenosed.  Prox LAD to Mid LAD lesion, 50% stenosed.  1st Diag lesion, 90% stenosed.  Normal right heart pressures and LV filling pressures.  1. 3 vessel obstructive CAD 2. Patent LIMA graft to the LAD 3. Patent free RIMA graft from the mid LIMA to the second OM 4. Normal right heart pressures and LV filling pressures. 5. Mild AV gradient.   Recommendation: I would consider staged PCI of the proximal RCA and the first diagonal.  Recommend starting P2Y12 and ASA. When cleared by Renal will bring back to lab for PCI. I would treat the first OM ostial disease medically.     ASSESSMENT AND PLAN  [name] is a 81 y.o. female with a history of CAD, ischemic cardiomyopathy, chronic systolic CHF, HTN, HLD, tobacco abuse, COPD, CKD, PAD, and mitral regurgitation presented 5/17 with unstable angina. -- S/p CATH 5/18 - noted to have severe RCA & Diag disease with patent Y LIMA-RIMA graft  1. Unstable angina = with mild troponin elevation.   -- 3 V CAD with patent Graft but ungrafted RCA & D1 lesions noted - Plan Staged PCI on Monday  -  Unfortunately, the creatinine level has gone up today - I have discussed  with nephrology,   . She has some chest pain last night but this has been relieved with nitroglycerin and isosorbide mononitrate.  2. Chronic systolic and diastolic CHF/ischemic cardiomyopathy - normal RHC pressures with normal PCWP indicating adequate diuresis. -04/20/2014 EF 37-85%, grade 2 diastolic dysfunction, severe LAE; severe MR (probably ischemia mediated); mild to moderate AS; mild AI and TR; moderately elevated pulmonary pressure. - continue home dose of PO furosemide - but with dyspnea today will give an additional dose of 40 mg IV Lasix. -  Converted from Lopressor 25 mg twice a day to Toprol 50 mg. Rate is stable.  - Nitro PRN.  -- Continue hydralazine/Imdur.  3.Hypertension - As above. BP relatively stable..   4. Palpitation - likely PVCs. - on BB.  Also - Palpitation improve overnight.  .  5. CKD - IV - single kidney and baseline creatinine somewhere between 1.7 and 2. Nephrology following. Appreciate their prompt f/u. - Creatinine went up a little bit today to 1.9, we'll postpone PCI until Monday provided the renal function normalizes.  6. COPD - per IM  7. Hyperlipidemia -Continue statin  8. PVD - bilat ICA 60-79% -Dopplers checked during her admission April 2016- no change,  no symptoms      Ilma Achee, Wonda Cheng, MD  06/02/2014 12:19 PM    [place],  [place], [place]  [address] Pager [phone] Phone: [phone]; Fax: [phone]   [facility]  [address] [place], [place]  [address] [phone]    Fax [phone]

## 2014-06-02 NOTE — Progress Notes (Signed)
Subjective: Interval History: has complaints CP this am.  Objective: Vital signs in last 24 hours: Temp:  [97.6 F (36.4 C)-98.8 F (37.1 C)] 97.6 F (36.4 C) (05/21 0803) Pulse Rate:  [72-78] 73 (05/21 0434) Resp:  [20-24] 20 (05/20 2000) BP: (102-113)/(46-61) 102/46 mmHg (05/21 0803) SpO2:  [89 %-93 %] 89 % (05/21 0434) Weight:  [59.512 kg (131 lb 3.2 oz)] 59.512 kg (131 lb 3.2 oz) (05/21 0757) Weight change:   Intake/Output from previous day: 05/20 0701 - 05/21 0700 In: 1040 [P.O.:840; I.V.:200] Out: 1900 [Urine:1900] Intake/Output this shift: Total I/O In: 240 [P.O.:240] Out: 300 [Urine:300]  General appearance: alert, cooperative and pale Resp: diminished breath sounds bilaterally and rales bibasilar Cardio: S1, S2 normal, systolic murmur: holosystolic 2/6, blowing throughout the precordium and 2nd systolic murmur: systolic ejection 2/6, decrescendo at 2nd left intercostal space GI: soft, non-tender; bowel sounds normal; no masses,  no organomegaly Extremities: trophic changes distally  Lab Results:  Recent Labs  06/01/14 0303 06/02/14 0536  WBC 6.8 6.6  HGB 11.6* 11.4*  HCT 36.3 35.2*  PLT 416* 411*   BMET:  Recent Labs  06/01/14 0302 06/02/14 0536  NA 138 136  K 4.1 3.8  CL 102 99*  CO2 28 29  GLUCOSE 82 96  BUN 21* 23*  CREATININE 1.92* 1.87*  CALCIUM 9.1 8.7*   No results for input(s): PTH in the last 72 hours. Iron Studies: No results for input(s): IRON, TIBC, TRANSFERRIN, FERRITIN in the last 72 hours.  Studies/Results: Dg Chest 2 View  06/01/2014   CLINICAL DATA:  Heaviness and mid chest pain since April, history COPD, coronary disease post MI and CABG in 2010, planned stent placement 06/04/2014, former smoker, ischemic cardiomyopathy, chronic systolic CHF, hypertension, former smoker  EXAM: CHEST  2 VIEW  COMPARISON:  05/29/2014  FINDINGS: Enlargement of cardiac silhouette post median sternotomy, by history CABG.  Pulmonary vascular  congestion.  Diffuse interstitial infiltrates compatible pulmonary edema and CHF.  Small bibasilar effusions greater on LEFT.  Bibasilar atelectasis.  Large hiatal hernia.  RIGHT perihilar nodular density, increasing counts acuity since previous exam, could be related to prominent pulmonary vascular structures but unable to completely exclude perihilar mass or less likely hilar adenopathy.  No pneumothorax.  Bones demineralized.  IMPRESSION: Large hiatal hernia.  Post CABG.  Diffuse interstitial infiltrates likely pulmonary edema.  Questionable RIGHT perihilar mass, with an area of mass like opacity more prominent than on previous exam; followup CT chest with contrast recommended to exclude tumor.   Electronically Signed   By: Lavonia Dana M.D.   On: 06/01/2014 15:36    I have reviewed the patient's current medications.  Assessment/Plan: 1 CKD 4 stable. Diuresing 2 HTn avoid too low bp 3 CAD angina worrisome 4 PVD 5 HPTH 6 anemia P follow Cr, NTg. Cath/PCI on Mon    LOS: 1 day   Gable Odonohue L 06/02/2014,9:26 AM

## 2014-06-03 LAB — RENAL FUNCTION PANEL
Albumin: 3 g/dL — ABNORMAL LOW (ref 3.5–5.0)
Anion gap: 7 (ref 5–15)
BUN: 24 mg/dL — ABNORMAL HIGH (ref 6–20)
CO2: 32 mmol/L (ref 22–32)
Calcium: 8.7 mg/dL — ABNORMAL LOW (ref 8.9–10.3)
Chloride: 98 mmol/L — ABNORMAL LOW (ref 101–111)
Creatinine, Ser: 1.88 mg/dL — ABNORMAL HIGH (ref 0.44–1.00)
GFR calc non Af Amer: 23 mL/min — ABNORMAL LOW (ref >60)
GFR, EST AFRICAN AMERICAN: 27 mL/min — AB (ref >60)
Glucose, Bld: 86 mg/dL (ref 65–99)
Phosphorus: 3.6 mg/dL (ref 2.5–4.6)
Potassium: 4.1 mmol/L (ref 3.5–5.1)
SODIUM: 137 mmol/L (ref 135–145)

## 2014-06-03 MED ORDER — SODIUM CHLORIDE 0.9 % IV SOLN: 250.0000 mL | INTRAVENOUS | Status: DC | PRN

## 2014-06-03 MED ORDER — FUROSEMIDE 40 MG PO TABS
40.0000 mg | ORAL_TABLET | Freq: Two times a day (BID) | ORAL | Status: DC
  Administered 2014-06-03 – 2014-06-06 (×4): 40 mg via ORAL
  Filled 2014-06-03 (×4): qty 1

## 2014-06-03 MED ORDER — SODIUM CHLORIDE 0.9 % IJ SOLN
3.0000 mL | Freq: Two times a day (BID) | INTRAMUSCULAR | Status: DC
Start: 2014-06-03 — End: 2014-06-04
  Administered 2014-06-03: 3 mL via INTRAVENOUS

## 2014-06-03 MED ORDER — SODIUM CHLORIDE 0.9 % IJ SOLN: 3.0000 mL | INTRAMUSCULAR | Status: DC | PRN

## 2014-06-03 MED ORDER — ASPIRIN 81 MG PO CHEW
81.0000 mg | CHEWABLE_TABLET | ORAL | Status: AC
  Administered 2014-06-04: 81 mg via ORAL
  Filled 2014-06-03: qty 1

## 2014-06-03 MED ORDER — SODIUM CHLORIDE 0.9 % WEIGHT BASED INFUSION
1.0000 mL/kg/h | INTRAVENOUS | Status: DC
  Administered 2014-06-04: 1 mL/kg/h via INTRAVENOUS

## 2014-06-03 MED ORDER — SODIUM CHLORIDE 0.9 % WEIGHT BASED INFUSION
3.0000 mL/kg/h | INTRAVENOUS | Status: DC
  Administered 2014-06-04: 3 mL/kg/h via INTRAVENOUS

## 2014-06-03 NOTE — Progress Notes (Signed)
[facility] Progress Note   CHANDRIKA SANDLES YPP:[id number] DOB: Jan 14, 1927 DOA: 05/29/2014 PCP: Leonard Downing, MD  Brief narrative: [name] is a 81 y.o. female with a history of ischemic cardiomyopathy with EF 30-35%, one artery disease with RCA BMS in July 2010, hypertension, COPD, bronchiectasis, hyperlipidemia presents with chest pressure that began at 5 AM on the morning of admission.   Subjective: She's had no further chest pain.. No complaints of nausea. Still is getting short of breath when ambulating to the bathroom. No cough no wheezing. No constipation or diarrhea.  Assessment/Plan: Principal Problem:   Chest pain--coronary artery disease - BMS to the RCA 7/10, CABG 2 and aortic root repair 9/10 - Troponin noted to be mildly elevated at 0.05- EKG did not reveal any acute findings -Left and right heart cath performed on 5/18 reveals 3 vessel obstructive coronary artery disease-see full report below-cardiology recommends a staged PCI of the proximal RCA and the first diagonal which will be done tomorrow -Continue aspirin and Plavix- she was not on Plavix prior to this admission -Continue when necessary nitroglycerin for chest pain-also added a GI cocktail when necessary in case there is a reflux component  Active Problems:  Rectal bleed - She had one episode of rectal bleeding 5/19 with a protruding rectal mass which retracted back into the rectum once the patient was laying back in bed-this was all noted by the nursing staff- subsequent bowel movements have not had any blood - GI evaluated the patient on 5/19 and suspecting a hemorrhoidal bleed -noted to have a mild drop in hemoglobin on 5/19 when compared to prior labs-  Hb has since been stable at 11 -Due to above-mentioned cardiac issues will need to continue aspirin and Plavix for now-also on heparin for DVT prophylaxis  Hypoxic resp failure- acute on chronic combined systolic and diastolic heart  failure/ischemic cardiomyopathy - EF 30-35% echo 04/20/14 -Noted to have increasing O2 requirements and increasing dyspnea on exertion - chest x-ray performed on 5/20 reveals increasing pulmonary edema-received 40 and then another 80 mg of IV Lasix on 5/20 with good diuresis -Given him another dose 40 of IV Lasix on 5/21 as she continued to be hypoxic- he was in negative balance by almost 2 L yesterday -She is already receiving 40 mg of oral Lasix daily- I will increase this to twice a day today to continue to diurese her she is still requiring 4 L of oxygen today -Started incentive spirometry as well    Dyslipidemia -Continue statin    Essential hypertension -Continue amlodipine, hydralazine, Imdur, Lopressor, Lasix    PVD - bilat ICA 60-79% -Continue aspirin and statin    COPD without acute exacerbation -Continue albuterol inhaler    CKD (chronic kidney disease), stage IV -Nephrology following- creatinine has been stable    Malnutrition of moderate degree -Has been started on Ensure supplements   Code Status: Full code Family Communication:  Disposition Plan: Home when stable DVT prophylaxis: Heparin Consultants: Cardiology and nephrology Procedures: Cardiac cath 5/18  Prox RCA lesion, 85% stenosed. In stent.  Prox Cx to Mid Cx lesion, 70% stenosed.  1st Mrg lesion, 80% stenosed.  Prox LAD to Mid LAD lesion, 50% stenosed.  1st Diag lesion, 90% stenosed.  Normal right heart pressures and LV filling pressures.  1. 3 vessel obstructive CAD 2. Patent LIMA graft to the LAD 3. Patent free RIMA graft from the mid LIMA to the second OM 4. Normal right heart pressures and LV filling pressures.  5. Mild AV gradient.    Antibiotics: Anti-infectives    None      Objective: Filed Weights   05/31/14 0512 06/02/14 0757 06/03/14 0506  Weight: 61.1 kg (134 lb 11.2 oz) 59.512 kg (131 lb 3.2 oz) 60.2 kg (132 lb 11.5 oz)    Intake/Output Summary (Last 24 hours) at  06/03/14 1041 Last data filed at 06/03/14 0846  Gross per 24 hour  Intake    360 ml  Output   1550 ml  Net  -1190 ml     Vitals Filed Vitals:   06/02/14 2035 06/02/14 2355 06/03/14 0506 06/03/14 0832  BP: 108/75 116/51 109/59 114/53  Pulse: 78 74 79   Temp: 97.7 F (36.5 C) 97.7 F (36.5 C) 98.4 F (36.9 C)   TempSrc: Oral Oral Oral   Resp: 18 18 18    Height:      Weight:   60.2 kg (132 lb 11.5 oz)   SpO2: 95% 95% 95%     Exam:  General:  Pt is alert, not in acute distress  HEENT: No icterus, No thrush  Cardiovascular: regular rate and rhythm, S1/S2 No murmur  Respiratory: Crackles at bilateral bases  Abdomen: Soft, +Bowel sounds, non tender, non distended, no guarding  MSK: No LE edema, cyanosis or clubbing  Data Reviewed: Basic Metabolic Panel:  Recent Labs Lab 05/30/14 0342 05/30/14 1619 05/31/14 0408 06/01/14 0302 06/02/14 0536 06/03/14 0419  NA 139  --  138 138 136 137  K 3.6  --  3.9 4.1 3.8 4.1  CL 103  --  103 102 99* 98*  CO2 27  --  27 28 29  32  GLUCOSE 77  --  74 82 96 86  BUN 23*  --  22* 21* 23* 24*  CREATININE 1.82* 1.77* 1.66* 1.92* 1.87* 1.88*  CALCIUM 8.9  --  8.7* 9.1 8.7* 8.7*  PHOS  --   --  3.4 3.8 4.1 3.6   Liver Function Tests:  Recent Labs Lab 05/31/14 0408 06/01/14 0302 06/02/14 0536 06/03/14 0419  AST 21  --   --   --   ALT 9*  --   --   --   ALKPHOS 43  --   --   --   BILITOT 0.9  --   --   --   PROT 5.6*  --   --   --   ALBUMIN 3.1* 3.1* 2.9* 3.0*   No results for input(s): LIPASE, AMYLASE in the last 168 hours. No results for input(s): AMMONIA in the last 168 hours. CBC:  Recent Labs Lab 05/30/14 1619 05/31/14 0408 05/31/14 1503 06/01/14 0303 06/02/14 0536  WBC 5.9 5.2 6.5 6.8 6.6  HGB 13.1 11.2* 11.7* 11.6* 11.4*  HCT 40.9 35.0* 36.7 36.3 35.2*  MCV 90.3 89.7 90.0 91.0 90.0  PLT 461* 383 448* 416* 411*   Cardiac Enzymes:  Recent Labs Lab 05/29/14 1000 05/29/14 1546 05/29/14 2205  06/01/14 0302  TROPONINI 0.03 0.03 0.05* 0.13*   BNP (last 3 results)  Recent Labs  05/05/14 1935 05/22/14 0409 05/29/14 1000  BNP 949.1* 801.8* 851.1*    ProBNP (last 3 results) No results for input(s): PROBNP in the last 8760 hours.  CBG: No results for input(s): GLUCAP in the last 168 hours.  No results found for this or any previous visit (from the past 240 hour(s)).   Studies: Dg Chest 2 View  06/01/2014   CLINICAL DATA:  Heaviness and mid chest pain since April,  history COPD, coronary disease post MI and CABG in 2010, planned stent placement 06/04/2014, former smoker, ischemic cardiomyopathy, chronic systolic CHF, hypertension, former smoker  EXAM: CHEST  2 VIEW  COMPARISON:  05/29/2014  FINDINGS: Enlargement of cardiac silhouette post median sternotomy, by history CABG.  Pulmonary vascular congestion.  Diffuse interstitial infiltrates compatible pulmonary edema and CHF.  Small bibasilar effusions greater on LEFT.  Bibasilar atelectasis.  Large hiatal hernia.  RIGHT perihilar nodular density, increasing counts acuity since previous exam, could be related to prominent pulmonary vascular structures but unable to completely exclude perihilar mass or less likely hilar adenopathy.  No pneumothorax.  Bones demineralized.  IMPRESSION: Large hiatal hernia.  Post CABG.  Diffuse interstitial infiltrates likely pulmonary edema.  Questionable RIGHT perihilar mass, with an area of mass like opacity more prominent than on previous exam; followup CT chest with contrast recommended to exclude tumor.   Electronically Signed   By: Lavonia Dana M.D.   On: 06/01/2014 15:36    Scheduled Meds:  Scheduled Meds: . albuterol  5 mg Nebulization Once  . aspirin EC  81 mg Oral Daily  . clopidogrel  75 mg Oral Daily  . furosemide  40 mg Oral Daily  . heparin  5,000 Units Subcutaneous 3 times per day  . isosorbide mononitrate  60 mg Oral Daily  . metoprolol succinate  50 mg Oral Daily  . potassium  chloride SA  10 mEq Oral Daily  . pravastatin  80 mg Oral q1800  . sodium chloride  3 mL Intravenous Q12H  . sodium chloride  3 mL Intravenous Q12H  . sodium chloride  3 mL Intravenous Q12H   Continuous Infusions:    Time spent on care of this patient: 35 minutes   [place], MD 06/03/2014, 10:41 AM  LOS: 2 days   [facility] Office  [phone] Pager - Text Page per [email or web address]  If 7PM-7AM, please contact night-coverage [email or web address]

## 2014-06-03 NOTE — Progress Notes (Signed)
Pt ambulated on RA, 92% when we started. Pt sats dropped to 88%, pt is asymptomatic. Pt did 1 lap around the unit, with no complaints of SOB. 02 Back on at 3L once we arrived back the room, pt sats 93%. Will continue to monitor. Etta Quill, RN

## 2014-06-03 NOTE — Progress Notes (Signed)
Subjective: Interval History: has no complaint, no CP since yest am.  Objective: Vital signs in last 24 hours: Temp:  [97.7 F (36.5 C)-98.4 F (36.9 C)] 98.4 F (36.9 C) (05/22 0506) Pulse Rate:  [74-79] 79 (05/22 0506) Resp:  [18] 18 (05/22 0506) BP: (108-124)/(51-75) 114/53 mmHg (05/22 0832) SpO2:  [95 %] 95 % (05/22 0506) Weight:  [60.2 kg (132 lb 11.5 oz)] 60.2 kg (132 lb 11.5 oz) (05/22 0506) Weight change:   Intake/Output from previous day: 05/21 0701 - 05/22 0700 In: 360 [P.O.:360] Out: 2350 [Urine:2350] Intake/Output this shift: Total I/O In: 240 [P.O.:240] Out: -   General appearance: alert, cooperative and pale Resp: diminished breath sounds bibasilar and rales bibasilar Cardio: S1, S2 normal, systolic murmur: holosystolic 3/6, blowing at apex and 2nd systolic murmur: systolic ejection 2/6, decrescendo at 2nd left intercostal space GI: pos bs, liver down 4 cm, soft Extremities: trophic changes distally  Lab Results:  Recent Labs  06/01/14 0303 06/02/14 0536  WBC 6.8 6.6  HGB 11.6* 11.4*  HCT 36.3 35.2*  PLT 416* 411*   BMET:  Recent Labs  06/02/14 0536 06/03/14 0419  NA 136 137  K 3.8 4.1  CL 99* 98*  CO2 29 32  GLUCOSE 96 86  BUN 23* 24*  CREATININE 1.87* 1.88*  CALCIUM 8.7* 8.7*   No results for input(s): PTH in the last 72 hours. Iron Studies: No results for input(s): IRON, TIBC, TRANSFERRIN, FERRITIN in the last 72 hours.  Studies/Results: Dg Chest 2 View  06/01/2014   CLINICAL DATA:  Heaviness and mid chest pain since April, history COPD, coronary disease post MI and CABG in 2010, planned stent placement 06/04/2014, former smoker, ischemic cardiomyopathy, chronic systolic CHF, hypertension, former smoker  EXAM: CHEST  2 VIEW  COMPARISON:  05/29/2014  FINDINGS: Enlargement of cardiac silhouette post median sternotomy, by history CABG.  Pulmonary vascular congestion.  Diffuse interstitial infiltrates compatible pulmonary edema and CHF.  Small  bibasilar effusions greater on LEFT.  Bibasilar atelectasis.  Large hiatal hernia.  RIGHT perihilar nodular density, increasing counts acuity since previous exam, could be related to prominent pulmonary vascular structures but unable to completely exclude perihilar mass or less likely hilar adenopathy.  No pneumothorax.  Bones demineralized.  IMPRESSION: Large hiatal hernia.  Post CABG.  Diffuse interstitial infiltrates likely pulmonary edema.  Questionable RIGHT perihilar mass, with an area of mass like opacity more prominent than on previous exam; followup CT chest with contrast recommended to exclude tumor.   Electronically Signed   By: Lavonia Dana M.D.   On: 06/01/2014 15:36    I have reviewed the patient's current medications.  Assessment/Plan: 1 CKD 4 Cr stable, diuesing . Angina stable 2 CAD for PCI,  3 PVD 4 MR 5 anemia stable, 6 HPTH vit D 7 Rectal bleed stable 8 ^ lipids P lasix, start fluids 12h prior to procedure, PCI,     LOS: 2 days   Haneef Hallquist L 06/03/2014,8:47 AM

## 2014-06-03 NOTE — Progress Notes (Signed)
Patient Name: KHADEEJAH CASTNER Date of Encounter: 06/03/2014  SUBJECTIVE  81 year old female with a history of ischemic cardiomyopathy with an EF of 30-35%. Plan PCI of RCA and diagonal in AM.  CURRENT MEDS . albuterol  5 mg Nebulization Once  . aspirin EC  81 mg Oral Daily  . clopidogrel  75 mg Oral Daily  . furosemide  40 mg Oral BID  . heparin  5,000 Units Subcutaneous 3 times per day  . isosorbide mononitrate  60 mg Oral Daily  . metoprolol succinate  50 mg Oral Daily  . potassium chloride SA  10 mEq Oral Daily  . pravastatin  80 mg Oral q1800  . sodium chloride  3 mL Intravenous Q12H  . sodium chloride  3 mL Intravenous Q12H  . sodium chloride  3 mL Intravenous Q12H    OBJECTIVE  Filed Vitals:   06/02/14 2035 06/02/14 2355 06/03/14 0506 06/03/14 0832  BP: 108/75 116/51 109/59 114/53  Pulse: 78 74 79   Temp: 97.7 F (36.5 C) 97.7 F (36.5 C) 98.4 F (36.9 C)   TempSrc: Oral Oral Oral   Resp: 18 18 18    Height:      Weight:   132 lb 11.5 oz (60.2 kg)   SpO2: 95% 95% 95%     Intake/Output Summary (Last 24 hours) at 06/03/14 1053 Last data filed at 06/03/14 0846  Gross per 24 hour  Intake    360 ml  Output   1550 ml  Net  -1190 ml   Filed Weights   05/31/14 0512 06/02/14 0757 06/03/14 0506  Weight: 134 lb 11.2 oz (61.1 kg) 131 lb 3.2 oz (59.512 kg) 132 lb 11.5 oz (60.2 kg)    PHYSICAL EXAM  General: Pleasant, NAD. Neuro: Grossly normal HEENT:  Normal Neck: Supple Lungs:  CTA Heart: RRR 2/6 HSM. Abdomen: Soft, non-tender, non-distended Extremities: No edema.   Accessory Clinical Findings  CBC  Recent Labs  06/01/14 0303 06/02/14 0536  WBC 6.8 6.6  HGB 11.6* 11.4*  HCT 36.3 35.2*  MCV 91.0 90.0  PLT 416* 382*   Basic Metabolic Panel  Recent Labs  06/02/14 0536 06/03/14 0419  NA 136 137  K 3.8 4.1  CL 99* 98*  CO2 29 32  GLUCOSE 96 86  BUN 23* 24*  CREATININE 1.87* 1.88*  CALCIUM 8.7* 8.7*  PHOS 4.1 3.6    Recent Labs  06/01/14 0302  TROPONINI 0.13*    Cardiac Cath 5/18:  Conclusion     Prox RCA lesion, 85% stenosed. In stent.  Prox Cx to Mid Cx lesion, 70% stenosed.  1st Mrg lesion, 80% stenosed.  Prox LAD to Mid LAD lesion, 50% stenosed.  1st Diag lesion, 90% stenosed.  Normal right heart pressures and LV filling pressures.  1. 3 vessel obstructive CAD 2. Patent LIMA graft to the LAD 3. Patent free RIMA graft from the mid LIMA to the second OM 4. Normal right heart pressures and LV filling pressures. 5. Mild AV gradient.   Recommendation: I would consider staged PCI of the proximal RCA and the first diagonal. Recommend starting P2Y12 and ASA. When cleared by Renal will bring back to lab for PCI. I would treat the first OM ostial disease medically.     ASSESSMENT AND PLAN  CALLAHAN PEDDIE is a 81 y.o. female with a history of CAD, ischemic cardiomyopathy, chronic systolic CHF, HTN, HLD, tobacco abuse, COPD, CKD, PAD, and mitral regurgitation presented 5/17 with unstable angina. --  S/p CATH 5/18 - noted to have severe RCA & Diag disease with patent Y LIMA-RIMA graft  1. Unstable angina = with mild troponin elevation.   -- 3 V CAD with patent graft but ungrafted RCA & D1 lesions noted - Plan Staged PCI on Monday  -  Follow renal function closely following procedure  2. Chronic systolic and diastolic CHF/ischemic cardiomyopathy - normal RHC pressures with normal PCWP indicating adequate diuresis. -04/20/2014 EF 97-41%, grade 2 diastolic dysfunction, severe LAE; severe MR (probably ischemia mediated); mild to moderate AS; mild AI and TR; moderately elevated pulmonary pressure. - Hold lasix in AM prior to PCI; continue metoprolol and nitrates; resume hydralazine post procedure if BP allows; no ACEI given renal insuff.  3.Hypertension - As above. BP relatively stable..    4. CKD - IV - single kidney and baseline creatinine somewhere between 1.7 and 2. Nephrology following. Follow  renal function closely prior to procedure.  6. COPD - per IM  7. Hyperlipidemia -Continue statin  8. PVD - bilat ICA 60-79%    Kirk Ruths, MD  06/03/2014 10:53 AM

## 2014-06-03 NOTE — H&P (View-Only) (Signed)
Patient Name: [name] Date of Encounter: 06/03/2014  SUBJECTIVE  81 year old female with a history of ischemic cardiomyopathy with an EF of 30-35%. Plan PCI of RCA and diagonal in AM.  CURRENT MEDS . albuterol  5 mg Nebulization Once  . aspirin EC  81 mg Oral Daily  . clopidogrel  75 mg Oral Daily  . furosemide  40 mg Oral BID  . heparin  5,000 Units Subcutaneous 3 times per day  . isosorbide mononitrate  60 mg Oral Daily  . metoprolol succinate  50 mg Oral Daily  . potassium chloride SA  10 mEq Oral Daily  . pravastatin  80 mg Oral q1800  . sodium chloride  3 mL Intravenous Q12H  . sodium chloride  3 mL Intravenous Q12H  . sodium chloride  3 mL Intravenous Q12H    OBJECTIVE  Filed Vitals:   06/02/14 2035 06/02/14 2355 06/03/14 0506 06/03/14 0832  BP: 108/75 116/51 109/59 114/53  Pulse: 78 74 79   Temp: 97.7 F (36.5 C) 97.7 F (36.5 C) 98.4 F (36.9 C)   TempSrc: Oral Oral Oral   Resp: 18 18 18    Height:      Weight:   132 lb 11.5 oz (60.2 kg)   SpO2: 95% 95% 95%     Intake/Output Summary (Last 24 hours) at 06/03/14 1053 Last data filed at 06/03/14 0846  Gross per 24 hour  Intake    360 ml  Output   1550 ml  Net  -1190 ml   Filed Weights   05/31/14 0512 06/02/14 0757 06/03/14 0506  Weight: 134 lb 11.2 oz (61.1 kg) 131 lb 3.2 oz (59.512 kg) 132 lb 11.5 oz (60.2 kg)    PHYSICAL EXAM  General: Pleasant, NAD. Neuro: Grossly normal HEENT:  Normal Neck: Supple Lungs:  CTA Heart: RRR 2/6 HSM. Abdomen: Soft, non-tender, non-distended Extremities: No edema.   Accessory Clinical Findings  CBC  Recent Labs  06/01/14 0303 06/02/14 0536  WBC 6.8 6.6  HGB 11.6* 11.4*  HCT 36.3 35.2*  MCV 91.0 90.0  PLT 416* 371*   Basic Metabolic Panel  Recent Labs  06/02/14 0536 06/03/14 0419  NA 136 137  K 3.8 4.1  CL 99* 98*  CO2 29 32  GLUCOSE 96 86  BUN 23* 24*  CREATININE 1.87* 1.88*  CALCIUM 8.7* 8.7*  PHOS 4.1 3.6    Recent Labs  06/01/14 0302  TROPONINI 0.13*    Cardiac Cath 5/18:  Conclusion     Prox RCA lesion, 85% stenosed. In stent.  Prox Cx to Mid Cx lesion, 70% stenosed.  1st Mrg lesion, 80% stenosed.  Prox LAD to Mid LAD lesion, 50% stenosed.  1st Diag lesion, 90% stenosed.  Normal right heart pressures and LV filling pressures.  1. 3 vessel obstructive CAD 2. Patent LIMA graft to the LAD 3. Patent free RIMA graft from the mid LIMA to the second OM 4. Normal right heart pressures and LV filling pressures. 5. Mild AV gradient.   Recommendation: I would consider staged PCI of the proximal RCA and the first diagonal. Recommend starting P2Y12 and ASA. When cleared by Renal will bring back to lab for PCI. I would treat the first OM ostial disease medically.     ASSESSMENT AND PLAN  [name] is a 81 y.o. female with a history of CAD, ischemic cardiomyopathy, chronic systolic CHF, HTN, HLD, tobacco abuse, COPD, CKD, PAD, and mitral regurgitation presented 5/17 with unstable angina. --  S/p CATH 5/18 - noted to have severe RCA & Diag disease with patent Y LIMA-RIMA graft  1. Unstable angina = with mild troponin elevation.   -- 3 V CAD with patent graft but ungrafted RCA & D1 lesions noted - Plan Staged PCI on Monday  -  Follow renal function closely following procedure  2. Chronic systolic and diastolic CHF/ischemic cardiomyopathy - normal RHC pressures with normal PCWP indicating adequate diuresis. -04/20/2014 EF 06-00%, grade 2 diastolic dysfunction, severe LAE; severe MR (probably ischemia mediated); mild to moderate AS; mild AI and TR; moderately elevated pulmonary pressure. - Hold lasix in AM prior to PCI; continue metoprolol and nitrates; resume hydralazine post procedure if BP allows; no ACEI given renal insuff.  3.Hypertension - As above. BP relatively stable..    4. CKD - IV - single kidney and baseline creatinine somewhere between 1.7 and 2. Nephrology following. Follow  renal function closely prior to procedure.  6. COPD - per IM  7. Hyperlipidemia -Continue statin  8. PVD - bilat ICA 60-79%    Kirk Ruths, MD  06/03/2014 10:53 AM

## 2014-06-03 NOTE — Interval H&P Note (Signed)
Cath Lab Visit (complete for each Cath Lab visit)  Clinical Evaluation Leading to the Procedure:   ACS: Yes.    Non-ACS:    Anginal Classification: CCS III  Anti-ischemic medical therapy: Maximal Therapy (2 or more classes of medications)  Non-Invasive Test Results: No non-invasive testing performed  Prior CABG: Previous CABG      History and Physical Interval Note:  06/03/2014 4:42 PM  [name]  has presented today for surgery, with the diagnosis of cad  The various methods of treatment have been discussed with the patient and family. After consideration of risks, benefits and other options for treatment, the patient has consented to  Procedure(s): Coronary Stent Intervention (N/A) as a surgical intervention .  The patient's history has been reviewed, patient examined, no change in status, stable for surgery.  I have reviewed the patient's chart and labs.  Questions were answered to the patient's satisfaction.     Sinclair Grooms

## 2014-06-04 ENCOUNTER — Encounter (HOSPITAL_COMMUNITY)
Admission: EM | Disposition: A | Discharge status: Home / Self Care | Payer: Medicare Other | Source: Home / Self Care | Attending: Internal Medicine

## 2014-06-04 ENCOUNTER — Encounter (HOSPITAL_COMMUNITY): Payer: Self-pay | Admitting: Interventional Cardiology

## 2014-06-04 DIAGNOSIS — I25119 Atherosclerotic heart disease of native coronary artery with unspecified angina pectoris: Secondary | ICD-10-CM

## 2014-06-04 HISTORY — PX: CARDIAC CATHETERIZATION: SHX172

## 2014-06-04 LAB — RENAL FUNCTION PANEL
ANION GAP: 7 (ref 5–15)
Albumin: 2.9 g/dL — ABNORMAL LOW (ref 3.5–5.0)
BUN: 26 mg/dL — AB (ref 6–20)
CO2: 32 mmol/L (ref 22–32)
Calcium: 8.7 mg/dL — ABNORMAL LOW (ref 8.9–10.3)
Chloride: 98 mmol/L — ABNORMAL LOW (ref 101–111)
Creatinine, Ser: 1.81 mg/dL — ABNORMAL HIGH (ref 0.44–1.00)
GFR calc Af Amer: 28 mL/min — ABNORMAL LOW (ref >60)
GFR calc non Af Amer: 24 mL/min — ABNORMAL LOW (ref >60)
Glucose, Bld: 90 mg/dL (ref 65–99)
Phosphorus: 3.2 mg/dL (ref 2.5–4.6)
Potassium: 3.7 mmol/L (ref 3.5–5.1)
Sodium: 137 mmol/L (ref 135–145)

## 2014-06-04 LAB — CBC
HCT: 33 % — ABNORMAL LOW (ref 36.0–46.0)
HEMOGLOBIN: 10.8 g/dL — AB (ref 12.0–15.0)
MCH: 29.3 pg (ref 26.0–34.0)
MCHC: 32.7 g/dL (ref 30.0–36.0)
MCV: 89.7 fL (ref 78.0–100.0)
Platelets: 379 10*3/uL (ref 150–400)
RBC: 3.68 MIL/uL — AB (ref 3.87–5.11)
RDW: 14.5 % (ref 11.5–15.5)
WBC: 6.1 10*3/uL (ref 4.0–10.5)

## 2014-06-04 LAB — POCT ACTIVATED CLOTTING TIME
Activated Clotting Time: 245 seconds
Activated Clotting Time: 270 seconds

## 2014-06-04 SURGERY — CORONARY STENT INTERVENTION
Anesthesia: LOCAL

## 2014-06-04 MED ORDER — HEPARIN SODIUM (PORCINE) 1000 UNIT/ML IJ SOLN
INTRAMUSCULAR | Status: DC | PRN
  Administered 2014-06-04: 3000 [IU] via INTRAVENOUS
  Administered 2014-06-04: 6100 [IU] via INTRAVENOUS

## 2014-06-04 MED ORDER — CLOPIDOGREL BISULFATE 75 MG PO TABS
ORAL_TABLET | ORAL | Status: AC
  Filled 2014-06-04: qty 1

## 2014-06-04 MED ORDER — LIDOCAINE HCL (PF) 1 % IJ SOLN
INTRAMUSCULAR | Status: AC
  Filled 2014-06-04: qty 30

## 2014-06-04 MED ORDER — SODIUM CHLORIDE 0.9 % IJ SOLN: 3.0000 mL | INTRAMUSCULAR | Status: DC | PRN

## 2014-06-04 MED ORDER — SODIUM CHLORIDE 0.9 % IV SOLN
INTRAVENOUS | Status: AC
  Administered 2014-06-04: 18:00:00 via INTRAVENOUS

## 2014-06-04 MED ORDER — CLOPIDOGREL BISULFATE 75 MG PO TABS
ORAL_TABLET | ORAL | Status: DC | PRN
  Administered 2014-06-04: 150 mg via ORAL

## 2014-06-04 MED ORDER — VERAPAMIL HCL 2.5 MG/ML IV SOLN
INTRAVENOUS | Status: DC | PRN
  Administered 2014-06-04: 12:00:00 via INTRA_ARTERIAL

## 2014-06-04 MED ORDER — MIDAZOLAM HCL 2 MG/2ML IJ SOLN
INTRAMUSCULAR | Status: DC | PRN
  Administered 2014-06-04: 1 mg via INTRAVENOUS

## 2014-06-04 MED ORDER — CLOPIDOGREL BISULFATE 75 MG PO TABS
75.0000 mg | ORAL_TABLET | Freq: Every day | ORAL | Status: DC
  Administered 2014-06-05 – 2014-06-06 (×2): 75 mg via ORAL
  Filled 2014-06-04 (×2): qty 1

## 2014-06-04 MED ORDER — VERAPAMIL HCL 2.5 MG/ML IV SOLN
INTRAVENOUS | Status: AC
  Filled 2014-06-04: qty 2

## 2014-06-04 MED ORDER — ASPIRIN 81 MG PO CHEW
81.0000 mg | CHEWABLE_TABLET | Freq: Every day | ORAL | Status: DC
  Administered 2014-06-05 – 2014-06-06 (×2): 81 mg via ORAL
  Filled 2014-06-04 (×2): qty 1

## 2014-06-04 MED ORDER — FENTANYL CITRATE (PF) 100 MCG/2ML IJ SOLN
INTRAMUSCULAR | Status: AC
  Filled 2014-06-04: qty 2

## 2014-06-04 MED ORDER — HEPARIN SODIUM (PORCINE) 1000 UNIT/ML IJ SOLN
INTRAMUSCULAR | Status: AC
  Filled 2014-06-04: qty 1

## 2014-06-04 MED ORDER — OXYCODONE-ACETAMINOPHEN 5-325 MG PO TABS: 1.0000 | ORAL_TABLET | ORAL | Status: DC | PRN

## 2014-06-04 MED ORDER — ONDANSETRON HCL 4 MG/2ML IJ SOLN: 4.0000 mg | Freq: Four times a day (QID) | INTRAMUSCULAR | Status: DC | PRN

## 2014-06-04 MED ORDER — SODIUM CHLORIDE 0.9 % IV SOLN: 250.0000 mL | INTRAVENOUS | Status: DC | PRN

## 2014-06-04 MED ORDER — ACETAMINOPHEN 325 MG PO TABS
650.0000 mg | ORAL_TABLET | ORAL | Status: DC | PRN
  Filled 2014-06-04: qty 2

## 2014-06-04 MED ORDER — IOHEXOL 350 MG/ML SOLN
INTRAVENOUS | Status: DC | PRN
  Administered 2014-06-04: 45 mL via INTRAVENOUS

## 2014-06-04 MED ORDER — FENTANYL CITRATE (PF) 100 MCG/2ML IJ SOLN
INTRAMUSCULAR | Status: DC | PRN
  Administered 2014-06-04: 50 ug via INTRAVENOUS

## 2014-06-04 MED ORDER — ENOXAPARIN SODIUM 30 MG/0.3ML ~~LOC~~ SOLN
30.0000 mg | SUBCUTANEOUS | Status: DC
  Administered 2014-06-05 – 2014-06-06 (×2): 30 mg via SUBCUTANEOUS
  Filled 2014-06-04 (×3): qty 0.3

## 2014-06-04 MED ORDER — HEPARIN (PORCINE) IN NACL 2-0.9 UNIT/ML-% IJ SOLN
INTRAMUSCULAR | Status: AC
  Filled 2014-06-04: qty 1000

## 2014-06-04 MED ORDER — NITROGLYCERIN 1 MG/10 ML FOR IR/CATH LAB
INTRA_ARTERIAL | Status: AC
  Filled 2014-06-04: qty 10

## 2014-06-04 MED ORDER — SODIUM CHLORIDE 0.9 % IJ SOLN
3.0000 mL | Freq: Two times a day (BID) | INTRAMUSCULAR | Status: DC
  Administered 2014-06-04 – 2014-06-05 (×2): 3 mL via INTRAVENOUS

## 2014-06-04 MED ORDER — MIDAZOLAM HCL 2 MG/2ML IJ SOLN
INTRAMUSCULAR | Status: AC
  Filled 2014-06-04: qty 2

## 2014-06-04 MED ORDER — SODIUM CHLORIDE 0.9 % WEIGHT BASED INFUSION: 1.0000 mL/kg/h | INTRAVENOUS | Status: AC

## 2014-06-04 SURGICAL SUPPLY — 17 items
BALLN ANGIOSCULPT RX 3.0X10 (BALLOONS) ×2
BALLN ~~LOC~~ EUPHORA RX 4.0X20 (BALLOONS) ×2
BALLOON ANGIOSCULPT RX 3.0X10 (BALLOONS) ×1 IMPLANT
BALLOON ~~LOC~~ EUPHORA RX 4.0X20 (BALLOONS) ×1 IMPLANT
CATH VISTA GUIDE 6FR JR4 (CATHETERS) ×2 IMPLANT
DEVICE RAD COMP TR BAND LRG (VASCULAR PRODUCTS) ×2 IMPLANT
GLIDESHEATH SLEND A-KIT 6F 20G (SHEATH) ×2 IMPLANT
GLIDESHEATH SLEND SS 6F .021 (SHEATH) IMPLANT
KIT ENCORE 26 ADVANTAGE (KITS) ×2 IMPLANT
KIT HEART LEFT (KITS) ×2 IMPLANT
PACK CARDIAC CATHETERIZATION (CUSTOM PROCEDURE TRAY) ×2 IMPLANT
STENT PROMUS PREM MR 3.5X24 (Permanent Stent) ×2 IMPLANT
TRANSDUCER W/STOPCOCK (MISCELLANEOUS) ×4 IMPLANT
TUBING CIL FLEX 10 FLL-RA (TUBING) ×2 IMPLANT
WIRE HI TORQ BMW 190CM (WIRE) ×2 IMPLANT
WIRE HI TORQ VERSACORE-J 145CM (WIRE) ×2 IMPLANT
WIRE SAFE-T 1.5MM-J .035X260CM (WIRE) ×2 IMPLANT

## 2014-06-04 NOTE — Progress Notes (Addendum)
Had some RUE swelling after cath, esp near R brachial area, instructed nursing staff to use ice pack. R radial site began to wooze when TR band taken off, area also puffed up concerning for formation of hematoma, nurse holding pressure. Likely will resolve once pressure is held for extended period. Exam shows bounding 2+ distal pulse in R radial site.   Hilbert Corrigan PA Pager: [phone]

## 2014-06-04 NOTE — Progress Notes (Signed)
[facility] Progress Note   [name] XTK:[id number] DOB: 06-10-1927 DOA: 05/29/2014 PCP: Leonard Downing, MD  Brief narrative: [name] is a 81 y.o. female with a history of ischemic cardiomyopathy with EF 30-35%, one artery disease with RCA BMS in July 2010, hypertension, COPD, bronchiectasis, hyperlipidemia presents with chest pressure that began at 5 AM on the morning of admission.   Subjective: Evaluated prior to cath this morning-she had no complaints of chest pain or shortness of breath.  Assessment/Plan: Principal Problem:   Chest pain--coronary artery disease - BMS to the RCA 7/10, CABG 2 and aortic root repair 9/10 - Troponin noted to be mildly elevated at 0.05- EKG did not reveal any acute findings -Left and right heart cath performed on 5/18 reveals 3 vessel obstructive coronary artery disease-see full report below -Cardiac cath performed today with PCI to proximal RCA stent -Continue aspirin and Plavix- she was not on Plavix prior to this admission  Active Problems:  Rectal bleed - She had one episode of rectal bleeding 5/19 with a protruding rectal mass which retracted back into the rectum once the patient was laying back in bed-this was all noted by the nursing staff- subsequent bowel movements have not had any blood - GI evaluated the patient on 5/19 and suspecting a hemorrhoidal bleed -noted to have a mild drop in hemoglobin on 5/19 when compared to prior labs-  Hb has since been stable at 11 -Due to above-mentioned cardiac issues will need to continue aspirin and Plavix for now-also on heparin for DVT prophylaxis  Hypoxic resp failure- acute on chronic combined systolic and diastolic heart failure/ischemic cardiomyopathy - EF 30-35% echo 04/20/14 -Noted to have increasing O2 requirements and increasing dyspnea on exertion - chest x-ray performed on 5/20 reveals increasing pulmonary edema -Have been diuresing with intermittent doses of IV  Lasix-Increased Lasix to 40 mg twice a day yesterday-she is still requiring 2-3 L of oxygen today-was not chronically on oxygen prior to admission-continue to follow creatinine while diuresing -Started incentive spirometry as well    Dyslipidemia -Continue statin    Essential hypertension -Continue amlodipine, hydralazine, Imdur, Lopressor, Lasix    PVD - bilat ICA 60-79% -Continue aspirin and statin    COPD without acute exacerbation -Continue albuterol inhaler    CKD (chronic kidney disease), stage IV -Nephrology following- creatinine has been stable    Malnutrition of moderate degree -Has been started on Ensure supplements   Code Status: Full code Family Communication:  Disposition Plan: Home when stable DVT prophylaxis: Heparin Consultants: Cardiology and nephrology Procedures: Cardiac cath 5/18  Prox RCA lesion, 85% stenosed. In stent.  Prox Cx to Mid Cx lesion, 70% stenosed.  1st Mrg lesion, 80% stenosed.  Prox LAD to Mid LAD lesion, 50% stenosed.  1st Diag lesion, 90% stenosed.  Normal right heart pressures and LV filling pressures.  1. 3 vessel obstructive CAD 2. Patent LIMA graft to the LAD 3. Patent free RIMA graft from the mid LIMA to the second OM 4. Normal right heart pressures and LV filling pressures. 5. Mild AV gradient.    Antibiotics: Anti-infectives    None      Objective: Filed Weights   06/02/14 0757 06/03/14 0506 06/04/14 0607  Weight: 59.512 kg (131 lb 3.2 oz) 60.2 kg (132 lb 11.5 oz) 60.102 kg (132 lb 8 oz)    Intake/Output Summary (Last 24 hours) at 06/04/14 1439 Last data filed at 06/04/14 0651  Gross per 24 hour  Intake 444.28 ml  Output   1402 ml  Net -957.72 ml     Vitals Filed Vitals:   06/04/14 1217 06/04/14 1222 06/04/14 1227 06/04/14 1257  BP: 117/59 123/59 126/67 131/46  Pulse: 69 73 81 76  Temp:      TempSrc:      Resp: 19 25 12 20   Height:      Weight:      SpO2: 93% 93% 88% 83%    Exam:  General:   Pt is alert, not in acute distress  HEENT: No icterus, No thrush  Cardiovascular: regular rate and rhythm, S1/S2 No murmur  Respiratory: Crackles at left lung base  Abdomen: Soft, +Bowel sounds, non tender, non distended, no guarding  MSK: No LE edema, cyanosis or clubbing  Data Reviewed: Basic Metabolic Panel:  Recent Labs Lab 05/31/14 0408 06/01/14 0302 06/02/14 0536 06/03/14 0419 06/04/14 0225  NA 138 138 136 137 137  K 3.9 4.1 3.8 4.1 3.7  CL 103 102 99* 98* 98*  CO2 27 28 29  32 32  GLUCOSE 74 82 96 86 90  BUN 22* 21* 23* 24* 26*  CREATININE 1.66* 1.92* 1.87* 1.88* 1.81*  CALCIUM 8.7* 9.1 8.7* 8.7* 8.7*  PHOS 3.4 3.8 4.1 3.6 3.2   Liver Function Tests:  Recent Labs Lab 05/31/14 0408 06/01/14 0302 06/02/14 0536 06/03/14 0419 06/04/14 0225  AST 21  --   --   --   --   ALT 9*  --   --   --   --   ALKPHOS 43  --   --   --   --   BILITOT 0.9  --   --   --   --   PROT 5.6*  --   --   --   --   ALBUMIN 3.1* 3.1* 2.9* 3.0* 2.9*   No results for input(s): LIPASE, AMYLASE in the last 168 hours. No results for input(s): AMMONIA in the last 168 hours. CBC:  Recent Labs Lab 05/31/14 0408 05/31/14 1503 06/01/14 0303 06/02/14 0536 06/04/14 0225  WBC 5.2 6.5 6.8 6.6 6.1  HGB 11.2* 11.7* 11.6* 11.4* 10.8*  HCT 35.0* 36.7 36.3 35.2* 33.0*  MCV 89.7 90.0 91.0 90.0 89.7  PLT 383 448* 416* 411* 379   Cardiac Enzymes:  Recent Labs Lab 05/29/14 1000 05/29/14 1546 05/29/14 2205 06/01/14 0302  TROPONINI 0.03 0.03 0.05* 0.13*   BNP (last 3 results)  Recent Labs  05/05/14 1935 05/22/14 0409 05/29/14 1000  BNP 949.1* 801.8* 851.1*    ProBNP (last 3 results) No results for input(s): PROBNP in the last 8760 hours.  CBG: No results for input(s): GLUCAP in the last 168 hours.  No results found for this or any previous visit (from the past 240 hour(s)).   Studies: No results found.  Scheduled Meds:  Scheduled Meds: . albuterol  5 mg Nebulization  Once  . aspirin  81 mg Oral Daily  . aspirin EC  81 mg Oral Daily  . clopidogrel  75 mg Oral Daily  . [START ON 06/05/2014] clopidogrel  75 mg Oral Q breakfast  . [START ON 06/05/2014] enoxaparin (LOVENOX) injection  30 mg Subcutaneous Q24H  . furosemide  40 mg Oral BID  . isosorbide mononitrate  60 mg Oral Daily  . metoprolol succinate  50 mg Oral Daily  . potassium chloride SA  10 mEq Oral Daily  . pravastatin  80 mg Oral q1800  . sodium chloride  3 mL Intravenous Q12H  . sodium  chloride  3 mL Intravenous Q12H  . sodium chloride  3 mL Intravenous Q12H   Continuous Infusions: . sodium chloride      Time spent on care of this patient: 35 minutes   [place], MD 06/04/2014, 2:39 PM  LOS: 3 days   [facility] Office  [phone] Pager - Text Page per [email or web address]  If 7PM-7AM, please contact night-coverage [email or web address]

## 2014-06-04 NOTE — Progress Notes (Signed)
[facility] ROUNDING NOTE   Subjective:   Interval History: no issues this morning in great spirits  Objective:  Vital signs in last 24 hours:  Temp:  [98.4 F (36.9 C)-99.3 F (37.4 C)] 98.5 F (36.9 C) (05/23 0607) Pulse Rate:  [74-109] 74 (05/23 0607) Resp:  [16-18] 18 (05/23 0607) BP: (100-134)/(50-72) 103/57 mmHg (05/23 0607) SpO2:  [92 %-94 %] 94 % (05/23 0607) Weight:  [60.102 kg (132 lb 8 oz)] 60.102 kg (132 lb 8 oz) (05/23 4403)  Weight change: 0.59 kg (1 lb 4.8 oz) Filed Weights   06/02/14 0757 06/03/14 0506 06/04/14 0607  Weight: 59.512 kg (131 lb 3.2 oz) 60.2 kg (132 lb 11.5 oz) 60.102 kg (132 lb 8 oz)    Intake/Output: I/O last 3 completed shifts: In: 1044.3 [P.O.:960; I.V.:84.3] Out: 2002 [Urine:2000; Stool:2]   Intake/Output this shift:     CVS- RRR RS- CTA ABD- BS present soft non-distended EXT- no edema   Basic Metabolic Panel:  Recent Labs Lab 05/31/14 0408 06/01/14 0302 06/02/14 0536 06/03/14 0419 06/04/14 0225  NA 138 138 136 137 137  K 3.9 4.1 3.8 4.1 3.7  CL 103 102 99* 98* 98*  CO2 27 28 29  32 32  GLUCOSE 74 82 96 86 90  BUN 22* 21* 23* 24* 26*  CREATININE 1.66* 1.92* 1.87* 1.88* 1.81*  CALCIUM 8.7* 9.1 8.7* 8.7* 8.7*  PHOS 3.4 3.8 4.1 3.6 3.2    Liver Function Tests:  Recent Labs Lab 05/31/14 0408 06/01/14 0302 06/02/14 0536 06/03/14 0419 06/04/14 0225  AST 21  --   --   --   --   ALT 9*  --   --   --   --   ALKPHOS 43  --   --   --   --   BILITOT 0.9  --   --   --   --   PROT 5.6*  --   --   --   --   ALBUMIN 3.1* 3.1* 2.9* 3.0* 2.9*   No results for input(s): LIPASE, AMYLASE in the last 168 hours. No results for input(s): AMMONIA in the last 168 hours.  CBC:  Recent Labs Lab 05/31/14 0408 05/31/14 1503 06/01/14 0303 06/02/14 0536 06/04/14 0225  WBC 5.2 6.5 6.8 6.6 6.1  HGB 11.2* 11.7* 11.6* 11.4* 10.8*  HCT 35.0* 36.7 36.3 35.2* 33.0*  MCV 89.7 90.0 91.0 90.0 89.7  PLT 383 448* 416*  411* 379    Cardiac Enzymes:  Recent Labs Lab 05/29/14 1000 05/29/14 1546 05/29/14 2205 06/01/14 0302  TROPONINI 0.03 0.03 0.05* 0.13*    BNP: Invalid input(s): POCBNP  CBG: No results for input(s): GLUCAP in the last 168 hours.  Microbiology: Results for orders placed or performed during the hospital encounter of 04/19/14  Culture, blood (routine x 2)     Status: None   Collection Time: 04/19/14  4:20 AM  Result Value Ref Range Status   Specimen Description BLOOD RIGHT ARM  Final   Special Requests BOTTLES DRAWN AEROBIC AND ANAEROBIC 10CC EACH  Final   Culture   Final    NO GROWTH 5 DAYS Note: Culture results may be compromised due to an excessive volume of blood received in culture bottles. Performed at [facility]    Report Status 04/25/2014 FINAL  Final  Culture, blood (routine x 2)     Status: None   Collection Time: 04/19/14  4:25 AM  Result Value Ref Range Status  Specimen Description BLOOD LEFT HAND  Final   Special Requests BOTTLES DRAWN AEROBIC ONLY 10CC  Final   Culture   Final    NO GROWTH 5 DAYS Note: Culture results may be compromised due to an excessive volume of blood received in culture bottles. Performed at [facility]    Report Status 04/25/2014 FINAL  Final    Coagulation Studies: No results for input(s): LABPROT, INR in the last 72 hours.  Urinalysis: No results for input(s): COLORURINE, LABSPEC, PHURINE, GLUCOSEU, HGBUR, BILIRUBINUR, KETONESUR, PROTEINUR, UROBILINOGEN, NITRITE, LEUKOCYTESUR in the last 72 hours.  Invalid input(s): APPERANCEUR    Imaging: No results found.   Medications:   . sodium chloride 1 mL/kg/hr (06/04/14 0700)   . albuterol  5 mg Nebulization Once  . aspirin EC  81 mg Oral Daily  . clopidogrel  75 mg Oral Daily  . furosemide  40 mg Oral BID  . heparin  5,000 Units Subcutaneous 3 times per day  . isosorbide mononitrate  60 mg Oral Daily  . metoprolol succinate  50 mg Oral Daily  .  potassium chloride SA  10 mEq Oral Daily  . pravastatin  80 mg Oral q1800  . sodium chloride  3 mL Intravenous Q12H  . sodium chloride  3 mL Intravenous Q12H  . sodium chloride  3 mL Intravenous Q12H  . sodium chloride  3 mL Intravenous Q12H   sodium chloride, sodium chloride, sodium chloride, acetaminophen **OR** acetaminophen, gi cocktail, levalbuterol, ondansetron **OR** ondansetron (ZOFRAN) IV, polyethylene glycol, sodium chloride, sodium chloride, sodium chloride, zolpidem  Assessment/ Plan:   Chronic renal disease  Creatinine 1.8  This appears optimal for cardiac catheterization. Limiting dye exposure   Anemia stable  Lipids controlled with statins  Volume appears optimized   LOS: 3 Damean Poffenberger W @TODAY @9 :56 AM

## 2014-06-04 NOTE — Progress Notes (Signed)
Patient Name: [name] Date of Encounter: 06/04/2014  Primary Cardiologist: Dr. Angelena Form   Principal Problem:   Unstable angina Active Problems:   Dyslipidemia   Moderate MR   Essential hypertension   CAD- RCA BMS July 2010, CABG x 2 and AO root Sept 2010   Chronic combined systolic and diastolic heart failure, NYHA class 2   PVD - bilat ICA 60-79%   Bronchiectasis without acute exacerbation   COPD (chronic obstructive pulmonary disease)   CKD (chronic kidney disease), stage IV   ICM-EF 30-35% echo 04/20/14,  40% by echo June 2013, 31% by Myoview   Malnutrition of moderate degree   Coronary artery disease involving native coronary artery with unstable angina pectoris   Rectal bleeding   Acute respiratory failure with hypoxia   Chest pain    SUBJECTIVE  Denies any CP or SOB overnight.   CURRENT MEDS . albuterol  5 mg Nebulization Once  . aspirin EC  81 mg Oral Daily  . clopidogrel  75 mg Oral Daily  . furosemide  40 mg Oral BID  . heparin  5,000 Units Subcutaneous 3 times per day  . isosorbide mononitrate  60 mg Oral Daily  . metoprolol succinate  50 mg Oral Daily  . potassium chloride SA  10 mEq Oral Daily  . pravastatin  80 mg Oral q1800  . sodium chloride  3 mL Intravenous Q12H  . sodium chloride  3 mL Intravenous Q12H  . sodium chloride  3 mL Intravenous Q12H  . sodium chloride  3 mL Intravenous Q12H    OBJECTIVE  Filed Vitals:   06/03/14 1500 06/03/14 2000 06/04/14 0000 06/04/14 0607  BP: 103/56 134/72 100/50 103/57  Pulse: 109 99 78 74  Temp: 98.5 F (36.9 C) 98.4 F (36.9 C) 99.3 F (37.4 C) 98.5 F (36.9 C)  TempSrc: Oral Oral Oral Oral  Resp: 16 18 18 18   Height:      Weight:    132 lb 8 oz (60.102 kg)  SpO2: 94% 92% 94% 94%    Intake/Output Summary (Last 24 hours) at 06/04/14 1023 Last data filed at 06/04/14 0651  Gross per 24 hour  Intake 684.28 ml  Output   1702 ml  Net -1017.72 ml   Filed Weights   06/02/14 0757 06/03/14  0506 06/04/14 0607  Weight: 131 lb 3.2 oz (59.512 kg) 132 lb 11.5 oz (60.2 kg) 132 lb 8 oz (60.102 kg)    PHYSICAL EXAM  General: Pleasant, NAD. Neuro: Alert and oriented X 3. Moves all extremities spontaneously. Psych: Normal affect. HEENT:  Normal  Neck: Supple without bruits or JVD. Lungs:  Resp regular and unlabored, CTA. Heart: RRR no s3, s4, or murmurs. Abdomen: Soft, non-tender, non-distended, BS + x 4.  Extremities: No clubbing, cyanosis or edema. DP/PT/Radials 2+ and equal bilaterally.  Accessory Clinical Findings  CBC  Recent Labs  06/02/14 0536 06/04/14 0225  WBC 6.6 6.1  HGB 11.4* 10.8*  HCT 35.2* 33.0*  MCV 90.0 89.7  PLT 411* 559   Basic Metabolic Panel  Recent Labs  06/03/14 0419 06/04/14 0225  NA 137 137  K 4.1 3.7  CL 98* 98*  CO2 32 32  GLUCOSE 86 90  BUN 24* 26*  CREATININE 1.88* 1.81*  CALCIUM 8.7* 8.7*  PHOS 3.6 3.2   Liver Function Tests  Recent Labs  06/03/14 0419 06/04/14 0225  ALBUMIN 3.0* 2.9*    TELE NSR with LBBB    ECG  No  new EKG  Echocardiogram 04/20/2014  LV EF: 30% -  35%  ------------------------------------------------------------------- Indications:   CHF - 428.0.  ------------------------------------------------------------------- History:  PMH:  Coronary artery disease. Ischemic cardiomyopathy. Chronic obstructive pulmonary disease. Risk factors: Former tobacco use. Hypertension. Dyslipidemia.  ------------------------------------------------------------------- Study Conclusions  - Left ventricle: The cavity size was mildly dilated. Wall thickness was normal. Systolic function was moderately to severely reduced. The estimated ejection fraction was in the range of 30% to 35%. Diffuse hypokinesis. There is akinesis of the basalinferolateral myocardium. Features are consistent with a pseudonormal left ventricular filling pattern, with concomitant abnormal relaxation and increased  filling pressure (grade 2 diastolic dysfunction). - Aortic valve: Valve mobility was restricted. There was mild to moderate stenosis. There was mild regurgitation. Valve area (VTI): 1.46 cm^2. Valve area (Vmax): 1.59 cm^2. Valve area (Vmean): 1.39 cm^2. - Mitral valve: Calcified annulus. There was severe regurgitation. Valve area by continuity equation (using LVOT flow): 1.85 cm^2. - Left atrium: The atrium was severely dilated. - Pulmonary arteries: Systolic pressure was moderately increased. PA peak pressure: 42 mm Hg (S).  Impressions:  - Global hypokinesis with akinesis of the basal inferolateral wall; overall moderate to severe LV dysfunction; grade 2 diastolic dysfunction; severe LAE; severe MR (probably ischemia mediated); mild to moderate AS; mild AI and TR; moderately elevated pulmonary pressure.     Radiology/Studies  Dg Chest 2 View  06/01/2014   CLINICAL DATA:  Heaviness and mid chest pain since April, history COPD, coronary disease post MI and CABG in 2010, planned stent placement 06/04/2014, former smoker, ischemic cardiomyopathy, chronic systolic CHF, hypertension, former smoker  EXAM: CHEST  2 VIEW  COMPARISON:  05/29/2014  FINDINGS: Enlargement of cardiac silhouette post median sternotomy, by history CABG.  Pulmonary vascular congestion.  Diffuse interstitial infiltrates compatible pulmonary edema and CHF.  Small bibasilar effusions greater on LEFT.  Bibasilar atelectasis.  Large hiatal hernia.  RIGHT perihilar nodular density, increasing counts acuity since previous exam, could be related to prominent pulmonary vascular structures but unable to completely exclude perihilar mass or less likely hilar adenopathy.  No pneumothorax.  Bones demineralized.  IMPRESSION: Large hiatal hernia.  Post CABG.  Diffuse interstitial infiltrates likely pulmonary edema.  Questionable RIGHT perihilar mass, with an area of mass like opacity more prominent than on previous  exam; followup CT chest with contrast recommended to exclude tumor.   Electronically Signed   By: Lavonia Dana M.D.   On: 06/01/2014 15:36   Dg Chest 2 View  05/22/2014   CLINICAL DATA:  Shortness of breath off and on for 1 month. Chest pain. History of CHF.  EXAM: CHEST  2 VIEW  COMPARISON:  05/05/2014  FINDINGS: Postoperative changes in the mediastinum. Cardiac enlargement with mild pulmonary vascular congestion. Diffuse interstitial parenchymal pattern consistent with interstitial edema. Small bilateral pleural effusions. Congestive changes are improving since prior study. Prominence a right hilum consistent with prominent pulmonary vessels. Calcified and tortuous aorta. No pneumothorax. Emphysematous changes in the lungs.  IMPRESSION: Cardiac enlargement with pulmonary vascular congestion, interstitial edema, and small pleural effusions, improving since prior study. Emphysematous changes.   Electronically Signed   By: Lucienne Capers M.D.   On: 05/22/2014 05:09   Nm Pulmonary Perf And Vent  05/06/2014   CLINICAL DATA:  Dyspnea/short of breath. Difficulty breathing. Pulmonary embolism. Positive D-dimer. Hypoxia.  EXAM: NUCLEAR MEDICINE VENTILATION - PERFUSION LUNG SCAN  TECHNIQUE: Ventilation images were obtained in multiple projections using inhaled aerosol technetium 99 M DTPA. Perfusion images were obtained in  multiple projections after intravenous injection of Tc-7m MAA.  RADIOPHARMACEUTICALS:  40.0 mCi Tc-51m DTPA aerosol and 6.0 mCi Tc-56m MAA  COMPARISON:  Chest radiograph 05/05/2014.  FINDINGS: Ventilation: Heterogeneous ventilation study with clumping of radiotracer and a mottled appearance of the lungs. Some of this is probably due to CHF with airspace disease identified on yesterday's chest radiograph.  Perfusion: No wedge shaped peripheral perfusion defects to suggest acute pulmonary embolism.  IMPRESSION: Low probability of pulmonary embolus.   Electronically Signed   By: Dereck Ligas M.D.    On: 05/06/2014 13:12   Dg Chest Portable 1 View  05/29/2014   CLINICAL DATA:  Chest pain.  Short of breath.  EXAM: PORTABLE CHEST - 1 VIEW  COMPARISON:  05/22/2014  FINDINGS: Advanced cardiomegaly is stable. Large hiatal hernia is stable. Small pleural effusions stable. Vascular congestion and pulmonary edema are stable. No pneumothorax.  IMPRESSION: Stable CHF.   Electronically Signed   By: Marybelle Killings M.D.   On: 05/29/2014 10:21   Dg Chest Port 1 View  05/05/2014   CLINICAL DATA:  Short of breath  EXAM: PORTABLE CHEST - 1 VIEW  COMPARISON:  05/03/2014  FINDINGS: Cardiac enlargement with congestive heart failure. Vascular congestion and mild edema. Small bilateral effusions. These findings have progressed since the prior study. There is bibasilar atelectasis.  IMPRESSION: Progressive congestive heart failure with mild edema and bilateral effusions and bibasilar atelectasis.   Electronically Signed   By: Franchot Gallo M.D.   On: 05/05/2014 19:42   Cardiac Cath 5/18:  Conclusion     Prox RCA lesion, 85% stenosed. In stent.  Prox Cx to Mid Cx lesion, 70% stenosed.  1st Mrg lesion, 80% stenosed.  Prox LAD to Mid LAD lesion, 50% stenosed.  1st Diag lesion, 90% stenosed.  Normal right heart pressures and LV filling pressures.  1. 3 vessel obstructive CAD 2. Patent LIMA graft to the LAD 3. Patent free RIMA graft from the mid LIMA to the second OM 4. Normal right heart pressures and LV filling pressures. 5. Mild AV gradient.   Recommendation: I would consider staged PCI of the proximal RCA and the first diagonal. Recommend starting P2Y12 and ASA. When cleared by Renal will bring back to lab for PCI. I would treat the first OM ostial disease medically.          ASSESSMENT AND PLAN  BRALYN ESPINO is a 81 y.o. female with a history of CAD, ischemic cardiomyopathy, chronic systolic CHF, HTN, HLD, tobacco abuse, COPD, CKD, PAD, and mitral regurgitation presented 5/17 with  unstable angina. S/p Cath 5/18 severe RCA and diag dx with patent LIMA and RIMA graft.   1. Unstable angina  - 3 V CAD with patent graft but ungrafted RCA & D1 lesions noted - Plan Staged PCI today, renal function stable.   - continue ASA, plavix, statin, toprol XL, Imdur. BP does not allow addition of ACEI/ARB  2. Chronic systolic and diastolic HF  -75/10/2583 EF 27-78%, grade 2 diastolic dysfunction, severe LAE; severe MR (probably ischemia mediated); mild to moderate AS; mild AI and TR; moderately elevated pulmonary pressure.  3. ICM 4. HTN 5. CKD IV  - baseline Cr 1.7 -2.0.  6. COPD 7. Hyperlipidemia 8. PVD with bilateral ICA 60-79%  Ruben Im Pager: [phone]   Attending Note:   The patient was seen and examined.  Agree with assessment and plan as noted above.  Changes made to the above note as needed.  She  is s/p PIC to the prox RCA using a 3.5 mm x 24 Primier stent dilated to 4. 0 mm Doing well Slight oozing from TR band. Nurse to address. Anticipate DC tomorrow   Thayer Headings, Brooke Bonito., MD, [facility] 06/04/2014, 1:57 PM [address],  [place] Pager [phone]

## 2014-06-04 NOTE — Progress Notes (Signed)
Small hematoma at the elbow and medial. Removed ice and placed ACE wrap to provide hemostasis for cath related hematoma in mid arm. ? Injury during catheter removal which required a little tug due to spas, in radial/brachial. Radial pulse 2+. Will further hydrate over night. She will develop ecchymosis at wrist and right elbow/medial. If not hard or firm, will still be eligible for discharge in AM.

## 2014-06-05 ENCOUNTER — Encounter (HOSPITAL_COMMUNITY): Payer: Self-pay

## 2014-06-05 ENCOUNTER — Inpatient Hospital Stay (HOSPITAL_COMMUNITY): Payer: Medicare Other

## 2014-06-05 DIAGNOSIS — I340 Nonrheumatic mitral (valve) insufficiency: Secondary | ICD-10-CM

## 2014-06-05 LAB — BASIC METABOLIC PANEL
Anion gap: 6 (ref 5–15)
BUN: 20 mg/dL (ref 6–20)
CALCIUM: 8.4 mg/dL — AB (ref 8.9–10.3)
CO2: 25 mmol/L (ref 22–32)
Chloride: 105 mmol/L (ref 101–111)
Creatinine, Ser: 1.45 mg/dL — ABNORMAL HIGH (ref 0.44–1.00)
GFR calc Af Amer: 37 mL/min — ABNORMAL LOW (ref >60)
GFR calc non Af Amer: 32 mL/min — ABNORMAL LOW (ref >60)
Glucose, Bld: 87 mg/dL (ref 65–99)
POTASSIUM: 3.9 mmol/L (ref 3.5–5.1)
Sodium: 136 mmol/L (ref 135–145)

## 2014-06-05 LAB — CBC
HCT: 31.1 % — ABNORMAL LOW (ref 36.0–46.0)
Hemoglobin: 10.1 g/dL — ABNORMAL LOW (ref 12.0–15.0)
MCH: 29.4 pg (ref 26.0–34.0)
MCHC: 32.5 g/dL (ref 30.0–36.0)
MCV: 90.4 fL (ref 78.0–100.0)
PLATELETS: 370 10*3/uL (ref 150–400)
RBC: 3.44 MIL/uL — ABNORMAL LOW (ref 3.87–5.11)
RDW: 14.7 % (ref 11.5–15.5)
WBC: 6.2 10*3/uL (ref 4.0–10.5)

## 2014-06-05 MED ORDER — TRAZODONE HCL 50 MG PO TABS
25.0000 mg | ORAL_TABLET | Freq: Every evening | ORAL | Status: DC | PRN
  Administered 2014-06-05: 25 mg via ORAL
  Filled 2014-06-05: qty 1

## 2014-06-05 MED ORDER — METOPROLOL TARTRATE 25 MG PO TABS: 25.0000 mg | ORAL_TABLET | Freq: Every day | ORAL | Status: DC

## 2014-06-05 MED ORDER — METOPROLOL SUCCINATE ER 50 MG PO TB24: 50.0000 mg | ORAL_TABLET | Freq: Every day | ORAL | Status: DC

## 2014-06-05 MED ORDER — CLOPIDOGREL BISULFATE 75 MG PO TABS: 75.0000 mg | ORAL_TABLET | Freq: Every day | ORAL | Status: DC

## 2014-06-05 MED ORDER — ISOSORBIDE MONONITRATE ER 30 MG PO TB24: 30.0000 mg | ORAL_TABLET | Freq: Every day | ORAL | Status: DC

## 2014-06-05 MED ORDER — HYDRALAZINE HCL 25 MG PO TABS: 10.0000 mg | ORAL_TABLET | Freq: Three times a day (TID) | ORAL | Status: DC

## 2014-06-05 MED ORDER — FUROSEMIDE 40 MG PO TABS: 40.0000 mg | ORAL_TABLET | Freq: Two times a day (BID) | ORAL | Status: DC

## 2014-06-05 MED ORDER — POTASSIUM CHLORIDE CRYS ER 10 MEQ PO TBCR: 10.0000 meq | EXTENDED_RELEASE_TABLET | Freq: Two times a day (BID) | ORAL | Status: DC

## 2014-06-05 MED ORDER — HYDRALAZINE HCL 10 MG PO TABS: 10.0000 mg | ORAL_TABLET | Freq: Three times a day (TID) | ORAL | Status: DC

## 2014-06-05 MED FILL — Heparin Sodium (Porcine) 2 Unit/ML in Sodium Chloride 0.9%: INTRAMUSCULAR | Qty: 1000 | Status: AC

## 2014-06-05 MED FILL — Lidocaine HCl Local Preservative Free (PF) Inj 1%: INTRAMUSCULAR | Qty: 30 | Status: AC

## 2014-06-05 NOTE — Evaluation (Signed)
Physical Therapy Evaluation Patient Details Name: [name] MRN: [id number] DOB: 1927/05/30 Today's Date: 06/05/2014   History of Present Illness  Tinea Nobile is a 81 y.o. female, lives alone with pmh of CAD, ICM, chronic systolic HF, HTN, HLD, tobacco abuse, COPD, CKD presented to the ED with shortness of breath. now s/p Percuatneous intervention, stent placed during cardiac cath  Clinical Impression   Patient evaluated by Physical Therapy with no further acute PT needs identified, as pt will be discharging today. All education has been completed and the patient has no further questions.  See below for any follow-up Physical Therapy or equipment needs. PT is signing off. Thank you for this referral.     Follow Up Recommendations Home health PT;Supervision - Intermittent [facility])    Equipment Recommendations  Rolling walker with 5" wheels and home O2   Recommendations for Other Services       Precautions / Restrictions Precautions Precautions: None Precaution Comments: Fall risk greatly reduced by using RW      Mobility  Bed Mobility Overal bed mobility: Independent                Transfers Overall transfer level: Independent Equipment used: None                Ambulation/Gait Ambulation/Gait assistance: Supervision Ambulation Distance (Feet): 110 Feet Assistive device: Rolling walker (2 wheeled) Gait Pattern/deviations: Decreased stride length     General Gait Details: Cues to self-monitor for activity tolerance  Stairs            Wheelchair Mobility    Modified [name] (Stroke Patients Only)       Balance                                             Pertinent Vitals/Pain Pain Assessment: No/denies pain    Home Living Family/patient expects to be discharged to:: Private residence Living Arrangements: Alone Available Help at Discharge: Family;Available PRN/intermittently Type of Home: Mobile home Home  Access: Stairs to enter Entrance Stairs-Rails: Right Entrance Stairs-Number of Steps: 2 Home Layout: One level Home Equipment: Cane - single point;Walker - 2 wheels;Wheelchair - manual      Prior Function Level of Independence: Independent         Comments: pt does not use DME and drives to the store and church but usually has dgtr with her for MD appointments and other outings. Pt reports no falls in the last 5 years.     Hand Dominance        Extremity/Trunk Assessment   Upper Extremity Assessment: Overall WFL for tasks assessed           Lower Extremity Assessment: Overall WFL for tasks assessed;Generalized weakness      Cervical / Trunk Assessment: Normal  Communication   Communication: No difficulties  Cognition Arousal/Alertness: Awake/alert Behavior During Therapy: WFL for tasks assessed/performed Overall Cognitive Status: Within Functional Limits for tasks assessed                      General Comments      Exercises        Assessment/Plan    PT Assessment All further PT needs can be met in the next venue of care  PT Diagnosis Generalized weakness   PT Problem List Decreased activity tolerance;Decreased balance;Decreased knowledge of precautions;Cardiopulmonary status limiting  activity  PT Treatment Interventions     PT Goals (Current goals can be found in the Care Plan section) Acute Rehab PT Goals Patient Stated Goal: Doesn't want to have to be readmitted PT Goal Formulation: All assessment and education complete, DC therapy    Frequency     Barriers to discharge        Co-evaluation               End of Session Equipment Utilized During Treatment: Oxygen Activity Tolerance: Patient tolerated treatment well Patient left: in bed;with family/visitor present Nurse Communication: Mobility status         Time: 7829-5621 PT Time Calculation (min) (ACUTE ONLY): 17 min   Charges:   PT Evaluation $Initial PT Evaluation  Tier I: 1 Procedure     PT G CodesRoney Marion Hamff 06/05/2014, 4:09 PM  Roney Marion, [place] Pager [phone] Office [phone]

## 2014-06-05 NOTE — Progress Notes (Signed)
CARDIAC REHAB PHASE I   PRE:  Rate/Rhythm: 89 SR  BP:  Lying: 89 SR        SaO2: 93 3L O2, 87 RA  MODE:  Ambulation: 200 ft   POST:  Rate/Rhythm: 101 ST  BP:  Sitting: 134/62         SaO2: 91 6L  Pt on 3L O2, asked to check walking O2 sat. Pt O2 sats dropped to 87% on RA at rest prior to ambulation, returned to 91% on 3L at rest. Pt ambulated 200 ft on 3-6 L O2 (87% on 3L while ambulating, 88% on 4L while ambulating, 91% on 6L while ambulating), handheld assist x1, IV, mildly usteady gait (pt states she feels unsteady), poor activity tolerance. Pt c/o of significant DOE, denies cp, dizziness, standing rest x 3. Completed stent/CHF education.  Reviewed anti-platelet therapy, stent card, activity restrictions, ntg, exercise, heart healthy diet, daily weights, sodium and fluid restrictions, HF zones (gave pt heart failure book), and phase 2 cardiac rehab. Pt verbalized understanding. Pt agrees to phase 2 cardiac rehab. Will send referral to [facility].  Pt states she has been "in and out of the hospital since her heart surgery." Pt states she lives alone in a trailer (pt states she tried to use a walker previously and couldn't fit it through the door of her trailer) and her family is only available to check in on her periodically. Pt qualifies for home O2 and has never used oxygen before. Pt also states she has "had a poor appetite and does not eat much."  Pt is very weak and has very poor activity tolerance. Would benefit from [facility], PT consult, case management consult for home health needs, and nutrition consult and may not be ready for discharge today.  [id number]  Lenna Sciara, RN, BSN 06/05/2014 10:28 AM

## 2014-06-05 NOTE — Progress Notes (Signed)
Patient Name: [name] Date of Encounter: 06/05/2014  Primary Cardiologist: Dr. Angelena Form   Principal Problem:   Unstable angina Active Problems:   Moderate MR   CAD- RCA BMS July 2010, CABG x 2 and AO root Sept 2010   Chronic combined systolic and diastolic heart failure, NYHA class 2   COPD (chronic obstructive pulmonary disease)   CKD (chronic kidney disease), stage IV   ICM-EF 30-35% echo 04/20/14,  40% by echo June 2013, 31% by Myoview   Dyslipidemia   Essential hypertension   PVD - bilat ICA 60-79%   Bronchiectasis without acute exacerbation   Malnutrition of moderate degree   Coronary artery disease involving native coronary artery with unstable angina pectoris   Rectal bleeding   Acute respiratory failure with hypoxia   Chest pain    SUBJECTIVE  Denies any CP. Some SOB this am when she got up, O2 sats stable.   CURRENT MEDS . albuterol  5 mg Nebulization Once  . aspirin  81 mg Oral Daily  . clopidogrel  75 mg Oral Q breakfast  . enoxaparin (LOVENOX) injection  30 mg Subcutaneous Q24H  . furosemide  40 mg Oral BID  . isosorbide mononitrate  60 mg Oral Daily  . metoprolol succinate  50 mg Oral Daily  . potassium chloride SA  10 mEq Oral Daily  . pravastatin  80 mg Oral q1800  . sodium chloride  3 mL Intravenous Q12H  . sodium chloride  3 mL Intravenous Q12H    OBJECTIVE  Filed Vitals:   06/04/14 1900 06/04/14 2000 06/05/14 0030 06/05/14 0656  BP: 121/98 136/51 113/48 119/51  Pulse: 82 76 72 92  Temp:  98.6 F (37 C) 97.2 F (36.2 C) 97.3 F (36.3 C)  TempSrc:  Oral Oral Oral  Resp: 24 26 21  88  Height:      Weight:   134 lb 14.7 oz (61.2 kg)   SpO2: 95% 92% 98% 91%    Intake/Output Summary (Last 24 hours) at 06/05/14 0809 Last data filed at 06/05/14 0233  Gross per 24 hour  Intake    390 ml  Output    850 ml  Net   -460 ml   Filed Weights   06/03/14 0506 06/04/14 0607 06/05/14 0030  Weight: 132 lb 11.5 oz (60.2 kg) 132 lb 8 oz  (60.102 kg) 134 lb 14.7 oz (61.2 kg)    PHYSICAL EXAM  General: Pleasant, NAD. Neuro: Alert and oriented X 3. Moves all extremities spontaneously. Psych: Normal affect. HEENT:  Normal  Neck: Supple without bruits or JVD. Lungs:  Resp regular and unlabored, decreased breath sounds, no rales Heart: RRR no s3, s4, 2/6 systolic murmur Abdomen: Soft, non-tender, non-distended, BS + x 4.  Extremities: No clubbing, cyanosis or edema. DP/PT/Radials 2+ and equal bilaterally.  Accessory Clinical Findings  CBC  Recent Labs  06/04/14 0225 06/05/14 0355  WBC 6.1 6.2  HGB 10.8* 10.1*  HCT 33.0* 31.1*  MCV 89.7 90.4  PLT 379 301   Basic Metabolic Panel  Recent Labs  06/03/14 0419 06/04/14 0225 06/05/14 0355  NA 137 137 136  K 4.1 3.7 3.9  CL 98* 98* 105  CO2 32 32 25  GLUCOSE 86 90 87  BUN 24* 26* 20  CREATININE 1.88* 1.81* 1.45*  CALCIUM 8.7* 8.7* 8.4*  PHOS 3.6 3.2  --    Liver Function Tests  Recent Labs  06/03/14 0419 06/04/14 0225  ALBUMIN 3.0* 2.9*  TELE NSR with LBBB    Echocardiogram 04/20/2014  LV EF: 30% -  35%  ------------------------------------------------------------------- Indications:   CHF - 428.0.  ------------------------------------------------------------------- History:  PMH:  Coronary artery disease. Ischemic cardiomyopathy. Chronic obstructive pulmonary disease. Risk factors: Former tobacco use. Hypertension. Dyslipidemia.  ------------------------------------------------------------------- Study Conclusions  - Left ventricle: The cavity size was mildly dilated. Wall thickness was normal. Systolic function was moderately to severely reduced. The estimated ejection fraction was in the range of 30% to 35%. Diffuse hypokinesis. There is akinesis of the basalinferolateral myocardium. Features are consistent with a pseudonormal left ventricular filling pattern, with concomitant abnormal relaxation and increased  filling pressure (grade 2 diastolic dysfunction). - Aortic valve: Valve mobility was restricted. There was mild to moderate stenosis. There was mild regurgitation. Valve area (VTI): 1.46 cm^2. Valve area (Vmax): 1.59 cm^2. Valve area (Vmean): 1.39 cm^2. - Mitral valve: Calcified annulus. There was severe regurgitation. Valve area by continuity equation (using LVOT flow): 1.85 cm^2. - Left atrium: The atrium was severely dilated. - Pulmonary arteries: Systolic pressure was moderately increased. PA peak pressure: 42 mm Hg (S).  Impressions:  - Global hypokinesis with akinesis of the basal inferolateral wall; overall moderate to severe LV dysfunction; grade 2 diastolic dysfunction; severe LAE; severe MR (probably ischemia mediated); mild to moderate AS; mild AI and TR; moderately elevated pulmonary pressure.     Radiology/Studies  Dg Chest 2 View  06/01/2014   CLINICAL DATA:  Heaviness and mid chest pain since April, history COPD, coronary disease post MI and CABG in 2010, planned stent placement 06/04/2014, former smoker, ischemic cardiomyopathy, chronic systolic CHF, hypertension, former smoker  EXAM: CHEST  2 VIEW  COMPARISON:  05/29/2014  FINDINGS: Enlargement of cardiac silhouette post median sternotomy, by history CABG.  Pulmonary vascular congestion.  Diffuse interstitial infiltrates compatible pulmonary edema and CHF.  Small bibasilar effusions greater on LEFT.  Bibasilar atelectasis.  Large hiatal hernia.  RIGHT perihilar nodular density, increasing counts acuity since previous exam, could be related to prominent pulmonary vascular structures but unable to completely exclude perihilar mass or less likely hilar adenopathy.  No pneumothorax.  Bones demineralized.  IMPRESSION: Large hiatal hernia.  Post CABG.  Diffuse interstitial infiltrates likely pulmonary edema.  Questionable RIGHT perihilar mass, with an area of mass like opacity more prominent than on previous  exam; followup CT chest with contrast recommended to exclude tumor.   Electronically Signed   By: Lavonia Dana M.D.   On: 06/01/2014 15:36     Cardiac Cath 5/18:  Conclusion     Prox RCA lesion, 85% stenosed. In stent.  Prox Cx to Mid Cx lesion, 70% stenosed.  1st Mrg lesion, 80% stenosed.  Prox LAD to Mid LAD lesion, 50% stenosed.  1st Diag lesion, 90% stenosed.  Normal right heart pressures and LV filling pressures.  1. 3 vessel obstructive CAD 2. Patent LIMA graft to the LAD 3. Patent free RIMA graft from the mid LIMA to the second OM 4. Normal right heart pressures and LV filling pressures. 5. Mild AV gradient.   Recommendation: I would consider staged PCI of the proximal RCA and the first diagonal. Recommend starting P2Y12 and ASA. When cleared by Renal will bring back to lab for PCI. I would treat the first OM ostial disease medically.          ASSESSMENT AND PLAN  PRESCIOUS HURLESS is a 81 y.o. female with a history of CAD, ischemic cardiomyopathy, chronic systolic CHF, HTN, HLD, tobacco abuse, COPD, CKD,  PAD, and mitral regurgitation presented 5/17 with unstable angina. S/p Cath 5/18 severe RCA and diag dx with patent LIMA and RIMA graft.   1. Unstable angina  - 3 V CAD with patent graft but ungrafted RCA & D1 lesions noted -  renal function stable. She is s/p PIC to the prox RCA using a 3.5 mm x 24 Primier stent dilated to 4. 0 mm.  - continue ASA, plavix, statin, toprol XL, Imdur.   2. Chronic systolic and diastolic HF  -19/41/7408 EF 14-48%, grade 2 diastolic dysfunction, severe LAE; severe MR (probably ischemia mediated); mild to moderate AS; mild AI and TR; moderately elevated pulmonary pressure. Not on ACE secondary to low B/P and CRI.  3. ICM 4. HTN 5. CKD IV  - baseline Cr 1.7 -2.0.  6. COPD 7. Hyperlipidemia 8. PVD with bilateral ICA 60-79%  Plan: I checked her O2 sats this am on RA- she dropped down to 82 on RA. Will review with MD, she is  on Lasix and I/O negative 6L. Abnormal CXR noted- doubt she could have CT with contrast anytime soon.   Angelena Form PA-C 06/05/2014 8:13 AM  Attending Note:   The patient was seen and examined.  Agree with assessment and plan as noted above.  Changes made to the above note as needed.  I have personally reviewed the angiograms and the echocardiogram And talked with Dr. Angelena Form  1. CAD:  She is s/p PCI of the prox RCA.  The diag is a small vessel, long stenosis of moderate severity.  I would not attempt PCI of that branch.  She is having no symptoms of CP at this point.  Continue DAPT for 1 year  2. CHF:  Has diuresed 6 liters during this hospitalization .  She will need to go home on a dose of lasix that is higher than her previous home dose.  Her LV is dilated and EF is 30-35%.  I think that there is substantial risk in repairing the MV in an 81 yo female with this degree of LV dysfunction .   3. MR. :  Has severe MR but also in the setting of reduced LV function.  I do not think she is a good candidate for MR repair given these comorbidities.   4. Hypoxemia:  She may need to go home on O2.  Cardiac rehab to ambulate with her.   5. CKD :  Will need BMP checked in next week. Stable so far.   Probable DC today - seems to be stable from a cardiac standpoint.      Thayer Headings, Brooke Bonito., MD, [facility] 06/05/2014, 9:13 AM [address],  [place] Pager [phone]

## 2014-06-05 NOTE — Discharge Summary (Addendum)
Physician Discharge Summary  [name] EXB:[id number] DOB: 04/20/1927 DOA: 05/29/2014  PCP: Leonard Downing, MD  Admit date: 05/29/2014 Discharge date: 06/06/2014  Time spent: 50 minutes  Follow-up -Will need a repeat CT scan of the chest in 4-6 weeks to rule out neoplasm-see below scan results OB going home with continuous oxygen and home health PT  Discharge Condition: stable Diet recommendation: heart healthy, low sodium  Discharge Diagnoses:  Principal Problem:   Unstable angina Active Problems:   Acute respiratory failure with hypoxia   Dyslipidemia   Moderate MR   Essential hypertension   CAD- RCA BMS July 2010, CABG x 2 and AO root Sept 2010   Chronic combined systolic and diastolic heart failure, NYHA class 2   PVD - bilat ICA 60-79%   COPD (chronic obstructive pulmonary disease)   CKD (chronic kidney disease), stage IV   ICM-EF 30-35% echo 04/20/14,  40% by echo June 2013, 31% by Myoview   Malnutrition of moderate degree   Coronary artery disease involving native coronary artery with unstable angina pectoris   Rectal bleeding  thyroid goiter  History of present illness:  [name] is a 81 y.o. female with a history of ischemic cardiomyopathy with EF 30-35%, one artery disease with RCA BMS in July 2010, hypertension, COPD, bronchiectasis, hyperlipidemia presents with chest pressure that began at 5 AM on the morning of admission.   Hospital Course:  Principal Problem:  Chest pain--coronary artery disease - BMS to the RCA 7/10, CABG 2 and aortic root repair 9/10 - Troponin noted to be mildly elevated at 0.05- EKG did not reveal any acute findings -Left and right heart cath performed on 5/18 reveals 3 vessel obstructive coronary artery disease-see full report below -Cardiac cath performed with PCI to proximal RCA stent -Continue aspirin and Plavix- she was not on Plavix prior to this admission  Active Problems:  Rectal bleed - She had one  episode of rectal bleeding 5/19 with a protruding rectal mass which retracted back into the rectum once the patient was laying back in bed-this was all noted by the nursing staff- subsequent bowel movements have not had any blood - GI evaluated the patient on 5/19 and suspecting a hemorrhoidal bleed -noted to have a mild drop in hemoglobin on 5/19 when compared to prior labs- Hb has since been stable at 11 -Due to above-mentioned cardiac issues will need to continue aspirin and Plavix for now- -no further bleeding noted during the hospital stay  Hypoxic resp failure- acute on chronic combined systolic and diastolic heart failure/ischemic cardiomyopathy - EF 30-35% echo 04/20/14 -Noted to have increasing O2 requirements and increasing dyspnea on exertion - chest x-ray performed on 5/20 reveals increasing pulmonary edema -Have been diuresing with intermittent doses of IV Lasix-she is 6700cc negative balance since admission - at this point, cardiology is recommending against further aggressive diuresisin the hospital-they have recommended we we increase her Lasix from 40 mg daily to 40 mg twice a day -We have not been able to wean her off of O2 and she will go home with 3 L of O2 on exertion -Of note chest x-ray performed on 5/20 was suspicious for right perihilar nodular density-follow-up CT was performed which is negative for mass. She does have some mild mediastinal adenopathy which is suspected to be reactive and it is recommended she have a repeat CT scan in 4-6 weeks to exclude the possibility of a neoplasm   Dyslipidemia -Continue statin   Essential hypertension -Continue  amlodipine, hydralazine, Imdur, Lopressor, Lasix   PVD - bilat ICA 60-79% -Continue aspirin and statin   COPD without acute exacerbation -Continue albuterol inhaler   CKD (chronic kidney disease), stage IV -Nephrology following- creatinine has been stable   Malnutrition of moderate degree -Has been started on  Ensure supplements  Consultants: Cardiology and nephrology  Procedures: Cardiac cath 5/18  Prox RCA lesion, 85% stenosed. In stent.  Prox Cx to Mid Cx lesion, 70% stenosed.  1st Mrg lesion, 80% stenosed.  Prox LAD to Mid LAD lesion, 50% stenosed.  1st Diag lesion, 90% stenosed.  Normal right heart pressures and LV filling pressures.  1. 3 vessel obstructive CAD 2. Patent LIMA graft to the LAD 3. Patent free RIMA graft from the mid LIMA to the second OM 4. Normal right heart pressures and LV filling pressures. 5. Mild AV gradient.   Discharge Exam: Filed Weights   06/04/14 9518 06/05/14 0030 06/06/14 0038  Weight: 60.102 kg (132 lb 8 oz) 61.2 kg (134 lb 14.7 oz) 61.6 kg (135 lb 12.9 oz)   Filed Vitals:   06/06/14 1206  BP: 121/48  Pulse: 74  Temp: 98.7 F (37.1 C)  Resp: 18    General: AAO x 3, no distress Cardiovascular: RRR, no murmurs  Respiratory: clear to auscultation bilaterally GI: soft, non-tender, non-distended, bowel sound positive  Discharge Instructions You were cared for by a hospitalist during your hospital stay. If you have any questions about your discharge medications or the care you received while you were in the hospital after you are discharged, you can call the unit and asked to speak with the hospitalist on call if the hospitalist that took care of you is not available. Once you are discharged, your primary care physician will handle any further medical issues. Please note that NO REFILLS for any discharge medications will be authorized once you are discharged, as it is imperative that you return to your primary care physician (or establish a relationship with a primary care physician if you do not have one) for your aftercare needs so that they can reassess your need for medications and monitor your lab values.      Discharge Instructions    Amb Referral to Cardiac Rehabilitation    Complete by:  As directed   Congestive Heart Failure: If  diagnosis is Heart Failure, patient MUST meet each of the CMS criteria: 1. Left Ventricular Ejection Fraction </= 35% 2. NYHA class II-IV symptoms despite being on optimal heart failure therapy for at least 6 weeks. 3. Stable = have not had a recent (<6 weeks) or planned (<6 months) major cardiovascular hospitalization or procedure  Program Details: - Physician supervised classes - 1-3 classes per week over a 12-18 week period, generally for a total of 36 sessions  Physician Certification: I certify that the above Cardiac Rehabilitation treatment is medically necessary and is medically approved by me for treatment of this patient. The patient is willing and cooperative, able to ambulate and medically stable to participate in exercise rehabilitation. The participant's progress and Individualized Treatment Plan will be reviewed by the Medical Director, Cardiac Rehab staff and as indicated by the Referring/Ordering Physician.  Diagnosis:  PCI     Diet - low sodium heart healthy    Complete by:  As directed      Increase activity slowly    Complete by:  As directed             Medication List    STOP  taking these medications        amLODipine 10 MG tablet  Commonly known as:  NORVASC      TAKE these medications        albuterol 108 (90 BASE) MCG/ACT inhaler  Commonly known as:  PROVENTIL HFA;VENTOLIN HFA  Inhale 2 puffs into the lungs every 4 (four) hours as needed for wheezing or shortness of breath.     aspirin EC 81 MG tablet  Take 81 mg by mouth daily.     clopidogrel 75 MG tablet  Commonly known as:  PLAVIX  Take 1 tablet (75 mg total) by mouth daily with breakfast.     Coenzyme Q-10 100 MG capsule  Take 100 mg by mouth daily.     furosemide 40 MG tablet  Commonly known as:  LASIX  Take 1 tablet (40 mg total) by mouth 2 (two) times daily.     hydrALAZINE 10 MG tablet  Commonly known as:  APRESOLINE  Take 1 tablet (10 mg total) by mouth 3 (three) times daily.      isosorbide mononitrate 30 MG 24 hr tablet  Commonly known as:  IMDUR  Take 1 tablet (30 mg total) by mouth daily.     metoprolol tartrate 25 MG tablet  Commonly known as:  LOPRESSOR  Take 1 tablet (25 mg total) by mouth daily.     nitroGLYCERIN 0.4 MG SL tablet  Commonly known as:  NITROSTAT  Place 1 tablet (0.4 mg total) under the tongue every 5 (five) minutes as needed.     potassium chloride 10 MEQ tablet  Commonly known as:  K-DUR,KLOR-CON  Take 1 tablet (10 mEq total) by mouth 2 (two) times daily.     pravastatin 80 MG tablet  Commonly known as:  PRAVACHOL  TAKE ONE TABLET BY MOUTH ONCE DAILY IN THE EVENING       No Known Allergies Follow-up Information    Follow up with [facility].   Why:  OXYGEN  AND WALKER  ARRANGED, WILL BE DELIVERED TO PT'S ROOM   Contact information:   [address] [place] [address] [phone]       Follow up with [facility].   Why:  Home Health RN/PT arranged   Contact information:   [address] [place] [address] [phone]        The results of significant diagnostics from this hospitalization (including imaging, microbiology, ancillary and laboratory) are listed below for reference.    Significant Diagnostic Studies: Dg Chest 2 View  06/01/2014   CLINICAL DATA:  Heaviness and mid chest pain since April, history COPD, coronary disease post MI and CABG in 2010, planned stent placement 06/04/2014, former smoker, ischemic cardiomyopathy, chronic systolic CHF, hypertension, former smoker  EXAM: CHEST  2 VIEW  COMPARISON:  05/29/2014  FINDINGS: Enlargement of cardiac silhouette post median sternotomy, by history CABG.  Pulmonary vascular congestion.  Diffuse interstitial infiltrates compatible pulmonary edema and CHF.  Small bibasilar effusions greater on LEFT.  Bibasilar atelectasis.  Large hiatal hernia.  RIGHT perihilar nodular density, increasing counts acuity since previous  exam, could be related to prominent pulmonary vascular structures but unable to completely exclude perihilar mass or less likely hilar adenopathy.  No pneumothorax.  Bones demineralized.  IMPRESSION: Large hiatal hernia.  Post CABG.  Diffuse interstitial infiltrates likely pulmonary edema.  Questionable RIGHT perihilar mass, with an area of mass like opacity more prominent than on previous exam; followup  CT chest with contrast recommended to exclude tumor.   Electronically Signed   By: Lavonia Dana M.D.   On: 06/01/2014 15:36   Dg Chest 2 View  05/22/2014   CLINICAL DATA:  Shortness of breath off and on for 1 month. Chest pain. History of CHF.  EXAM: CHEST  2 VIEW  COMPARISON:  05/05/2014  FINDINGS: Postoperative changes in the mediastinum. Cardiac enlargement with mild pulmonary vascular congestion. Diffuse interstitial parenchymal pattern consistent with interstitial edema. Small bilateral pleural effusions. Congestive changes are improving since prior study. Prominence a right hilum consistent with prominent pulmonary vessels. Calcified and tortuous aorta. No pneumothorax. Emphysematous changes in the lungs.  IMPRESSION: Cardiac enlargement with pulmonary vascular congestion, interstitial edema, and small pleural effusions, improving since prior study. Emphysematous changes.   Electronically Signed   By: Lucienne Capers M.D.   On: 05/22/2014 05:09   Ct Chest Wo Contrast  06/05/2014   CLINICAL DATA:  Shortness of breath since stent placed yesterday. Possible right perihilar mass on recent chest x-ray.  EXAM: CT CHEST WITHOUT CONTRAST  TECHNIQUE: Multidetector CT imaging of the chest was performed following the standard protocol without IV contrast.  COMPARISON:  11/24/2011 and 03/19/2011  FINDINGS: Examination demonstrates small bilateral pleural effusions left greater than right with associated mild bibasilar consolidation likely atelectasis. Mild scarring versus atelectasis over the right middle lobe and  lingula. There is a 3-4 mm nodule over the right lower lobe unchanged from 2013. There is nodularity over the posterior right upper lobe which is new with the largest sub solid nodular area measuring approximate 1.4 cm. Airways are within normal.  Median sternotomy wires are present. There is mild stable cardiomegaly. Evidence of patient's right coronary artery stent. There is a moderate size hiatal hernia unchanged. There is prominence of the right hilum likely responsible for the chest x-ray abnormality as this appears to be vascular and unchanged from 2013. There has been interval development of mediastinal adenopathy with 2 adjacent precarinal nodes measuring 1.2 and 1.5 cm by short axis. Right peritracheal lymph node measuring 1.4 cm by short axis. No definitive hilar or axillary adenopathy on this noncontrast exam. Continued evidence of enlarged thyroid likely goiter which is stable.  Images through the upper abdomen demonstrate moderate calcified plaque over the abdominal aorta as well as diverticulosis of the colon. There are mild degenerate changes of the spine.  IMPRESSION: No focal right hilar mass.  Mild nodularity over the posterior right upper lobe which may be infectious or inflammatory in nature. Development of mild mediastinal adenopathy as described likely reactive. Recommend a follow-up chest CT in 4-6 weeks to document resolution and exclude possibility of neoplasm.  Small bilateral pleural effusions left greater than right with associated consolidation likely atelectasis.  Stable cardiomegaly.  Stable goiter. Moderate size hiatal hernia unchanged. Diverticulosis of the colon.   Electronically Signed   By: Marin Olp M.D.   On: 06/05/2014 19:51   Dg Chest Portable 1 View  05/29/2014   CLINICAL DATA:  Chest pain.  Short of breath.  EXAM: PORTABLE CHEST - 1 VIEW  COMPARISON:  05/22/2014  FINDINGS: Advanced cardiomegaly is stable. Large hiatal hernia is stable. Small pleural effusions stable.  Vascular congestion and pulmonary edema are stable. No pneumothorax.  IMPRESSION: Stable CHF.   Electronically Signed   By: Marybelle Killings M.D.   On: 05/29/2014 10:21    Microbiology: No results found for this or any previous visit (from the past 240 hour(s)).   Labs:  Basic Metabolic Panel:  Recent Labs Lab 05/31/14 0408 06/01/14 0302 06/02/14 0536 06/03/14 0419 06/04/14 0225 06/05/14 0355  NA 138 138 136 137 137 136  K 3.9 4.1 3.8 4.1 3.7 3.9  CL 103 102 99* 98* 98* 105  CO2 27 28 29  32 32 25  GLUCOSE 74 82 96 86 90 87  BUN 22* 21* 23* 24* 26* 20  CREATININE 1.66* 1.92* 1.87* 1.88* 1.81* 1.45*  CALCIUM 8.7* 9.1 8.7* 8.7* 8.7* 8.4*  PHOS 3.4 3.8 4.1 3.6 3.2  --    Liver Function Tests:  Recent Labs Lab 05/31/14 0408 06/01/14 0302 06/02/14 0536 06/03/14 0419 06/04/14 0225  AST 21  --   --   --   --   ALT 9*  --   --   --   --   ALKPHOS 43  --   --   --   --   BILITOT 0.9  --   --   --   --   PROT 5.6*  --   --   --   --   ALBUMIN 3.1* 3.1* 2.9* 3.0* 2.9*   No results for input(s): LIPASE, AMYLASE in the last 168 hours. No results for input(s): AMMONIA in the last 168 hours. CBC:  Recent Labs Lab 05/31/14 1503 06/01/14 0303 06/02/14 0536 06/04/14 0225 06/05/14 0355  WBC 6.5 6.8 6.6 6.1 6.2  HGB 11.7* 11.6* 11.4* 10.8* 10.1*  HCT 36.7 36.3 35.2* 33.0* 31.1*  MCV 90.0 91.0 90.0 89.7 90.4  PLT 448* 416* 411* 379 370   Cardiac Enzymes:  Recent Labs Lab 06/01/14 0302  TROPONINI 0.13*   BNP: BNP (last 3 results)  Recent Labs  05/05/14 1935 05/22/14 0409 05/29/14 1000  BNP 949.1* 801.8* 851.1*    ProBNP (last 3 results) No results for input(s): PROBNP in the last 8760 hours.  CBG: No results for input(s): GLUCAP in the last 168 hours.     SignedDebbe Odea, MD [facility] 06/06/2014, 12:12 PM

## 2014-06-05 NOTE — Progress Notes (Signed)
[facility] ROUNDING NOTE   Subjective:   Interval History: no complaints   Objective:  Vital signs in last 24 hours:  Temp:  [97.2 F (36.2 C)-98.6 F (37 C)] 97.3 F (36.3 C) (05/24 0656) Pulse Rate:  [68-92] 92 (05/24 0656) Resp:  [12-88] 88 (05/24 0656) BP: (71-136)/(32-98) 119/51 mmHg (05/24 0656) SpO2:  [83 %-98 %] 91 % (05/24 0656) Weight:  [61.2 kg (134 lb 14.7 oz)] 61.2 kg (134 lb 14.7 oz) (05/24 0030)  Weight change: 1.098 kg (2 lb 6.7 oz) Filed Weights   06/03/14 0506 06/04/14 0607 06/05/14 0030  Weight: 60.2 kg (132 lb 11.5 oz) 60.102 kg (132 lb 8 oz) 61.2 kg (134 lb 14.7 oz)    Intake/Output: I/O last 3 completed shifts: In: 594.3 [P.O.:120; I.V.:474.3] Out: 1951 [Urine:1950; Stool:1]   Intake/Output this shift:     CVS- RRR RS- CTA ABD- BS present soft non-distended EXT- no edema   Basic Metabolic Panel:  Recent Labs Lab 05/31/14 0408 06/01/14 0302 06/02/14 0536 06/03/14 0419 06/04/14 0225 06/05/14 0355  NA 138 138 136 137 137 136  K 3.9 4.1 3.8 4.1 3.7 3.9  CL 103 102 99* 98* 98* 105  CO2 27 28 29  32 32 25  GLUCOSE 74 82 96 86 90 87  BUN 22* 21* 23* 24* 26* 20  CREATININE 1.66* 1.92* 1.87* 1.88* 1.81* 1.45*  CALCIUM 8.7* 9.1 8.7* 8.7* 8.7* 8.4*  PHOS 3.4 3.8 4.1 3.6 3.2  --     Liver Function Tests:  Recent Labs Lab 05/31/14 0408 06/01/14 0302 06/02/14 0536 06/03/14 0419 06/04/14 0225  AST 21  --   --   --   --   ALT 9*  --   --   --   --   ALKPHOS 43  --   --   --   --   BILITOT 0.9  --   --   --   --   PROT 5.6*  --   --   --   --   ALBUMIN 3.1* 3.1* 2.9* 3.0* 2.9*   No results for input(s): LIPASE, AMYLASE in the last 168 hours. No results for input(s): AMMONIA in the last 168 hours.  CBC:  Recent Labs Lab 05/31/14 1503 06/01/14 0303 06/02/14 0536 06/04/14 0225 06/05/14 0355  WBC 6.5 6.8 6.6 6.1 6.2  HGB 11.7* 11.6* 11.4* 10.8* 10.1*  HCT 36.7 36.3 35.2* 33.0* 31.1*  MCV 90.0 91.0 90.0 89.7  90.4  PLT 448* 416* 411* 379 370    Cardiac Enzymes:  Recent Labs Lab 05/29/14 1546 05/29/14 2205 06/01/14 0302  TROPONINI 0.03 0.05* 0.13*    BNP: Invalid input(s): POCBNP  CBG: No results for input(s): GLUCAP in the last 168 hours.  Microbiology: Results for orders placed or performed during the hospital encounter of 04/19/14  Culture, blood (routine x 2)     Status: None   Collection Time: 04/19/14  4:20 AM  Result Value Ref Range Status   Specimen Description BLOOD RIGHT ARM  Final   Special Requests BOTTLES DRAWN AEROBIC AND ANAEROBIC 10CC EACH  Final   Culture   Final    NO GROWTH 5 DAYS Note: Culture results may be compromised due to an excessive volume of blood received in culture bottles. Performed at [facility]    Report Status 04/25/2014 FINAL  Final  Culture, blood (routine x 2)     Status: None   Collection Time: 04/19/14  4:25 AM  Result  Value Ref Range Status   Specimen Description BLOOD LEFT HAND  Final   Special Requests BOTTLES DRAWN AEROBIC ONLY 10CC  Final   Culture   Final    NO GROWTH 5 DAYS Note: Culture results may be compromised due to an excessive volume of blood received in culture bottles. Performed at [facility]    Report Status 04/25/2014 FINAL  Final    Coagulation Studies: No results for input(s): LABPROT, INR in the last 72 hours.  Urinalysis: No results for input(s): COLORURINE, LABSPEC, PHURINE, GLUCOSEU, HGBUR, BILIRUBINUR, KETONESUR, PROTEINUR, UROBILINOGEN, NITRITE, LEUKOCYTESUR in the last 72 hours.  Invalid input(s): APPERANCEUR    Imaging: No results found.   Medications:     . albuterol  5 mg Nebulization Once  . aspirin  81 mg Oral Daily  . clopidogrel  75 mg Oral Q breakfast  . enoxaparin (LOVENOX) injection  30 mg Subcutaneous Q24H  . furosemide  40 mg Oral BID  . isosorbide mononitrate  60 mg Oral Daily  . metoprolol succinate  50 mg Oral Daily  . potassium chloride SA  10 mEq Oral  Daily  . pravastatin  80 mg Oral q1800  . sodium chloride  3 mL Intravenous Q12H  . sodium chloride  3 mL Intravenous Q12H   sodium chloride, acetaminophen, gi cocktail, levalbuterol, ondansetron (ZOFRAN) IV, oxyCODONE-acetaminophen, polyethylene glycol, sodium chloride, zolpidem  Assessment/ Plan:   Chronic renal disease Creatinine 1.5   Anemia stable  Lipids controlled with statins  Volume appears optimized  Will sign off and can follow up with Dr Marval Regal   LOS: 4 [name] W @TODAY @12 :08 PM

## 2014-06-05 NOTE — Significant Event (Signed)
SATURATION QUALIFICATIONS: (This note is used to comply with regulatory documentation for home oxygen)  Patient Saturations on Room Air at Rest = 87%  Patient Saturations on Room Air while Ambulating = 84%  Patient Saturations on 2 Liters of oxygen while Ambulating = 92%  Please briefly explain why patient needs home oxygen: 

## 2014-06-05 NOTE — Progress Notes (Signed)
Notified MD about heart elevated 140's and complained about shortness of breath being worse and anxiety is elevated. I spent some time talking with her to help anxiety and temporary increased oxygen up to 4Lnc. Heart rate came down 80's and BP stable. Xopenix breathing treatment also done. I offered her sleeping pill Ambien she said the last dose she took she hallucinated I spoke with MD he agreed to trazodone. Patient is feeling better with breathing at this time but still elevated anxiety. I will continue to monitor.

## 2014-06-05 NOTE — Progress Notes (Signed)
SATURATION QUALIFICATIONS: (This note is used to comply with regulatory documentation for home oxygen)  Patient Saturations on Room Air at Rest = 87%, 91% on 3L at rest  Patient Saturations on Room Air while Ambulating = N/A  Patient Saturations on 3 Liters of oxygen while Ambulating = 87%, 88% on 4L, 91% on 6L while ambulating.    Please briefly explain why patient needs home oxygen: Pt requiring O2 to maintain sats at rest and during ambulation. Pt qualifies for home O2.

## 2014-06-05 NOTE — Progress Notes (Addendum)
Referral made with [facility]  for HHRN/PT,  home 02  and walker per CM.

## 2014-06-06 DIAGNOSIS — J439 Emphysema, unspecified: Secondary | ICD-10-CM

## 2014-06-06 MED ORDER — BOOST / RESOURCE BREEZE PO LIQD
1.0000 | Freq: Three times a day (TID) | ORAL | Status: DC
  Filled 2014-06-06 (×5): qty 1

## 2014-06-06 NOTE — Progress Notes (Signed)
Patient Name: [name] Date of Encounter: 06/06/2014  Primary Cardiologist: Dr. Angelena Form   Principal Problem:   Unstable angina Active Problems:   Dyslipidemia   Moderate MR   Essential hypertension   CAD- RCA BMS July 2010, CABG x 2 and AO root Sept 2010   Chronic combined systolic and diastolic heart failure, NYHA class 2   PVD - bilat ICA 60-79%   Bronchiectasis without acute exacerbation   COPD (chronic obstructive pulmonary disease)   CKD (chronic kidney disease), stage IV   ICM-EF 30-35% echo 04/20/14,  40% by echo June 2013, 31% by Myoview   Malnutrition of moderate degree   Coronary artery disease involving native coronary artery with unstable angina pectoris   Rectal bleeding   Acute respiratory failure with hypoxia    SUBJECTIVE  She had severe dyspnea yesterday . Nurses report a HR of 140 but I cannot find any tele strips confirming that   CURRENT MEDS . albuterol  5 mg Nebulization Once  . aspirin  81 mg Oral Daily  . clopidogrel  75 mg Oral Q breakfast  . enoxaparin (LOVENOX) injection  30 mg Subcutaneous Q24H  . furosemide  40 mg Oral BID  . isosorbide mononitrate  60 mg Oral Daily  . metoprolol succinate  50 mg Oral Daily  . potassium chloride SA  10 mEq Oral Daily  . pravastatin  80 mg Oral q1800  . sodium chloride  3 mL Intravenous Q12H  . sodium chloride  3 mL Intravenous Q12H    OBJECTIVE  Filed Vitals:   06/05/14 2107 06/06/14 0038 06/06/14 0627 06/06/14 0803  BP: 124/52 125/51 119/54 120/68  Pulse: 81 77 87 86  Temp: 97.9 F (36.6 C) 97.6 F (36.4 C) 98 F (36.7 C) 98 F (36.7 C)  TempSrc: Oral Oral Oral Oral  Resp: 18 16 20 17   Height:      Weight:  61.6 kg (135 lb 12.9 oz)    SpO2: 96% 98% 95% 97%    Intake/Output Summary (Last 24 hours) at 06/06/14 0857 Last data filed at 06/06/14 0804  Gross per 24 hour  Intake    540 ml  Output   4050 ml  Net  -3510 ml   Filed Weights   06/04/14 0607 06/05/14 0030 06/06/14 0038    Weight: 60.102 kg (132 lb 8 oz) 61.2 kg (134 lb 14.7 oz) 61.6 kg (135 lb 12.9 oz)    PHYSICAL EXAM  General: Pleasant, NAD. Neuro: Alert and oriented X 3. Moves all extremities spontaneously. Psych: Normal affect. HEENT:  Normal  Neck: Supple without bruits or JVD. Lungs:  Resp regular and unlabored, decreased breath sounds, no rales Heart: RRR no s3, s4, 2/6 systolic murmur Abdomen: Soft, non-tender, non-distended, BS + x 4.  Extremities: No clubbing, cyanosis or edema. DP/PT/Radials 2+ and equal bilaterally.  Accessory Clinical Findings  CBC  Recent Labs  06/04/14 0225 06/05/14 0355  WBC 6.1 6.2  HGB 10.8* 10.1*  HCT 33.0* 31.1*  MCV 89.7 90.4  PLT 379 938   Basic Metabolic Panel  Recent Labs  06/04/14 0225 06/05/14 0355  NA 137 136  K 3.7 3.9  CL 98* 105  CO2 32 25  GLUCOSE 90 87  BUN 26* 20  CREATININE 1.81* 1.45*  CALCIUM 8.7* 8.4*  PHOS 3.2  --    Liver Function Tests  Recent Labs  06/04/14 0225  ALBUMIN 2.9*    TELE NSR with LBBB    Echocardiogram 04/20/2014  LV EF: 30% -  35%  ------------------------------------------------------------------- Indications:   CHF - 428.0.  ------------------------------------------------------------------- History:  PMH:  Coronary artery disease. Ischemic cardiomyopathy. Chronic obstructive pulmonary disease. Risk factors: Former tobacco use. Hypertension. Dyslipidemia.  ------------------------------------------------------------------- Study Conclusions  - Left ventricle: The cavity size was mildly dilated. Wall thickness was normal. Systolic function was moderately to severely reduced. The estimated ejection fraction was in the range of 30% to 35%. Diffuse hypokinesis. There is akinesis of the basalinferolateral myocardium. Features are consistent with a pseudonormal left ventricular filling pattern, with concomitant abnormal relaxation and increased filling pressure (grade  2 diastolic dysfunction). - Aortic valve: Valve mobility was restricted. There was mild to moderate stenosis. There was mild regurgitation. Valve area (VTI): 1.46 cm^2. Valve area (Vmax): 1.59 cm^2. Valve area (Vmean): 1.39 cm^2. - Mitral valve: Calcified annulus. There was severe regurgitation. Valve area by continuity equation (using LVOT flow): 1.85 cm^2. - Left atrium: The atrium was severely dilated. - Pulmonary arteries: Systolic pressure was moderately increased. PA peak pressure: 42 mm Hg (S).  Impressions:  - Global hypokinesis with akinesis of the basal inferolateral wall; overall moderate to severe LV dysfunction; grade 2 diastolic dysfunction; severe LAE; severe MR (probably ischemia mediated); mild to moderate AS; mild AI and TR; moderately elevated pulmonary pressure.     Radiology/Studies  Dg Chest 2 View  06/01/2014   CLINICAL DATA:  Heaviness and mid chest pain since April, history COPD, coronary disease post MI and CABG in 2010, planned stent placement 06/04/2014, former smoker, ischemic cardiomyopathy, chronic systolic CHF, hypertension, former smoker  EXAM: CHEST  2 VIEW  COMPARISON:  05/29/2014  FINDINGS: Enlargement of cardiac silhouette post median sternotomy, by history CABG.  Pulmonary vascular congestion.  Diffuse interstitial infiltrates compatible pulmonary edema and CHF.  Small bibasilar effusions greater on LEFT.  Bibasilar atelectasis.  Large hiatal hernia.  RIGHT perihilar nodular density, increasing counts acuity since previous exam, could be related to prominent pulmonary vascular structures but unable to completely exclude perihilar mass or less likely hilar adenopathy.  No pneumothorax.  Bones demineralized.  IMPRESSION: Large hiatal hernia.  Post CABG.  Diffuse interstitial infiltrates likely pulmonary edema.  Questionable RIGHT perihilar mass, with an area of mass like opacity more prominent than on previous exam; followup CT chest  with contrast recommended to exclude tumor.   Electronically Signed   By: Lavonia Dana M.D.   On: 06/01/2014 15:36     Cardiac Cath 5/18:  Conclusion     Prox RCA lesion, 85% stenosed. In stent.  Prox Cx to Mid Cx lesion, 70% stenosed.  1st Mrg lesion, 80% stenosed.  Prox LAD to Mid LAD lesion, 50% stenosed.  1st Diag lesion, 90% stenosed.  Normal right heart pressures and LV filling pressures.  1. 3 vessel obstructive CAD 2. Patent LIMA graft to the LAD 3. Patent free RIMA graft from the mid LIMA to the second OM 4. Normal right heart pressures and LV filling pressures. 5. Mild AV gradient.   Recommendation: I would consider staged PCI of the proximal RCA and the first diagonal. Recommend starting P2Y12 and ASA. When cleared by Renal will bring back to lab for PCI. I would treat the first OM ostial disease medically.          ASSESSMENT AND PLAN  TERESIA MYINT is a 81 y.o. female with a history of CAD, ischemic cardiomyopathy, chronic systolic CHF, HTN, HLD, tobacco abuse, COPD, CKD, PAD, and mitral regurgitation presented 5/17 with unstable angina. S/p  Cath 5/18 severe RCA and diag dx with patent LIMA and RIMA graft.   1. Unstable angina  - 3 V CAD with patent graft but ungrafted RCA & D1 lesions noted -  renal function stable. She is s/p PIC to the prox RCA using a 3.5 mm x 24 Primier stent dilated to 4. 0 mm.  - continue ASA, plavix, statin, toprol XL, Imdur.   2. Chronic systolic and diastolic HF  -43/32/9518 EF 84-16%, grade 2 diastolic dysfunction, severe LAE; severe MR (probably ischemia mediated); mild to moderate AS; mild AI and TR; moderately elevated pulmonary pressure. Not on ACE secondary to low B/P and CRI.  3. ICM 4. HTN 5. CKD IV  - baseline Cr 1.7 -2.0.  6. COPD 7. Hyperlipidemia 8. PVD with bilateral ICA 60-79%  Plan: I checked her O2 sats this am on RA- she dropped down to 82 on RA. Will review with MD, she is on Lasix and I/O negative  6L. Abnormal CXR noted- doubt she could have CT with contrast anytime soon.     I have personally reviewed the angiograms and the echocardiogram And talked with Dr. Angelena Form  1. CAD:  She is s/p PCI of the prox RCA.  The diag is a small vessel, long stenosis of moderate severity.  I would not attempt PCI of that branch.  She is having no symptoms of CP at this point.  Continue DAPT for 1 year  2. CHF:  Has diuresed 6 liters during this hospitalization .  She will need to go home on a dose of lasix that is higher than her previous home dose.  Her LV is dilated and EF is 30-35%.  I think that there is substantial risk in repairing the MV in an 81 yo female with this degree of LV dysfunction .  She had an episode of dyspnea yesterday .  Seems to have resolved.    3. MR. :  Has severe MR but also in the setting of reduced LV function.  I do not think she is a good candidate for MR repair given these comorbidities.   4. Hypoxemia:  She may need to go home on O2.  Cardiac rehab to ambulate with her.   5. CKD :  Will need BMP checked in next week. Stable so far.   Further plans per Dr. Wynelle Cleveland.    Thayer Headings, Brooke Bonito., MD, [facility] 06/06/2014, 8:57 AM [address],  [place] Pager [phone]

## 2014-06-06 NOTE — Progress Notes (Signed)
CARDIAC REHAB PHASE I   PRE:  Rate/Rhythm: 91 SR  BP:  Sitting: 120/68        SaO2: 98 5L  MODE:  Ambulation: 260 ft   POST:  Rate/Rhythm: 100 SR  BP:  Sitting: 142/57         SaO2: 94 6L  Pt states she feels better today, on 5L O2 at rest (increased from 3L yesterday), pt states she felt very short of breath last night but improved after breathing treatment. Pt c/o of R arm pain from hematoma, pt is maintaining ice to site and keeping arm elevated. Pt ambulated  260 ft on 6L, rolling walker, assist x1, steady gait, tolerated well. Pt demonstrates improved activity tolerance today with greater ambulation distance. Pt O2 sats 93% on 6L during ambulation. Pt to recliner after walk, call bell within reach, O2 at 5 L. Pt states she has no questions regarding education. Pt states she wants to eat a milkshake when she leaves. Pt would still benefit from nutrition consult, MD aware, states she will order breeze supplement. Will send phase 2 referral at discharge.  [id number]   Lenna Sciara, RN, BSN 06/06/2014 9:28 AM

## 2014-06-06 NOTE — Progress Notes (Deleted)
Patient Name: [name] Date of Encounter: 06/06/2014  Primary Cardiologist: Dr. Angelena Form   Principal Problem:   Unstable angina Active Problems:   Dyslipidemia   Moderate MR   Essential hypertension   CAD- RCA BMS July 2010, CABG x 2 and AO root Sept 2010   Chronic combined systolic and diastolic heart failure, NYHA class 2   PVD - bilat ICA 60-79%   Bronchiectasis without acute exacerbation   COPD (chronic obstructive pulmonary disease)   CKD (chronic kidney disease), stage IV   ICM-EF 30-35% echo 04/20/14,  40% by echo June 2013, 31% by Myoview   Malnutrition of moderate degree   Coronary artery disease involving native coronary artery with unstable angina pectoris   Rectal bleeding   Acute respiratory failure with hypoxia    SUBJECTIVE  Denies any CP. Some SOB this am when she got up, O2 sats stable.   CURRENT MEDS . albuterol  5 mg Nebulization Once  . aspirin  81 mg Oral Daily  . clopidogrel  75 mg Oral Q breakfast  . enoxaparin (LOVENOX) injection  30 mg Subcutaneous Q24H  . furosemide  40 mg Oral BID  . isosorbide mononitrate  60 mg Oral Daily  . metoprolol succinate  50 mg Oral Daily  . potassium chloride SA  10 mEq Oral Daily  . pravastatin  80 mg Oral q1800  . sodium chloride  3 mL Intravenous Q12H  . sodium chloride  3 mL Intravenous Q12H    OBJECTIVE  Filed Vitals:   06/05/14 2107 06/06/14 0038 06/06/14 0627 06/06/14 0803  BP: 124/52 125/51 119/54 120/68  Pulse: 81 77 87 86  Temp: 97.9 F (36.6 C) 97.6 F (36.4 C) 98 F (36.7 C) 98 F (36.7 C)  TempSrc: Oral Oral Oral Oral  Resp: 18 16 20 17   Height:      Weight:  61.6 kg (135 lb 12.9 oz)    SpO2: 96% 98% 95% 97%    Intake/Output Summary (Last 24 hours) at 06/06/14 0849 Last data filed at 06/06/14 0804  Gross per 24 hour  Intake    540 ml  Output   4050 ml  Net  -3510 ml   Filed Weights   06/04/14 0607 06/05/14 0030 06/06/14 0038  Weight: 60.102 kg (132 lb 8 oz) 61.2 kg (134  lb 14.7 oz) 61.6 kg (135 lb 12.9 oz)    PHYSICAL EXAM  General: Pleasant, NAD. Neuro: Alert and oriented X 3. Moves all extremities spontaneously. Psych: Normal affect. HEENT:  Normal  Neck: Supple without bruits or JVD. Lungs:  Resp regular and unlabored, decreased breath sounds, no rales Heart: RRR no s3, s4, 2/6 systolic murmur Abdomen: Soft, non-tender, non-distended, BS + x 4.  Extremities: No clubbing, cyanosis or edema. DP/PT/Radials 2+ and equal bilaterally.  Accessory Clinical Findings  CBC  Recent Labs  06/04/14 0225 06/05/14 0355  WBC 6.1 6.2  HGB 10.8* 10.1*  HCT 33.0* 31.1*  MCV 89.7 90.4  PLT 379 350   Basic Metabolic Panel  Recent Labs  06/04/14 0225 06/05/14 0355  NA 137 136  K 3.7 3.9  CL 98* 105  CO2 32 25  GLUCOSE 90 87  BUN 26* 20  CREATININE 1.81* 1.45*  CALCIUM 8.7* 8.4*  PHOS 3.2  --    Liver Function Tests  Recent Labs  06/04/14 0225  ALBUMIN 2.9*    TELE NSR with LBBB    Echocardiogram 04/20/2014  LV EF: 30% -  35%  -------------------------------------------------------------------  Indications:   CHF - 428.0.  ------------------------------------------------------------------- History:  PMH:  Coronary artery disease. Ischemic cardiomyopathy. Chronic obstructive pulmonary disease. Risk factors: Former tobacco use. Hypertension. Dyslipidemia.  ------------------------------------------------------------------- Study Conclusions  - Left ventricle: The cavity size was mildly dilated. Wall thickness was normal. Systolic function was moderately to severely reduced. The estimated ejection fraction was in the range of 30% to 35%. Diffuse hypokinesis. There is akinesis of the basalinferolateral myocardium. Features are consistent with a pseudonormal left ventricular filling pattern, with concomitant abnormal relaxation and increased filling pressure (grade 2 diastolic dysfunction). - Aortic valve:  Valve mobility was restricted. There was mild to moderate stenosis. There was mild regurgitation. Valve area (VTI): 1.46 cm^2. Valve area (Vmax): 1.59 cm^2. Valve area (Vmean): 1.39 cm^2. - Mitral valve: Calcified annulus. There was severe regurgitation. Valve area by continuity equation (using LVOT flow): 1.85 cm^2. - Left atrium: The atrium was severely dilated. - Pulmonary arteries: Systolic pressure was moderately increased. PA peak pressure: 42 mm Hg (S).  Impressions:  - Global hypokinesis with akinesis of the basal inferolateral wall; overall moderate to severe LV dysfunction; grade 2 diastolic dysfunction; severe LAE; severe MR (probably ischemia mediated); mild to moderate AS; mild AI and TR; moderately elevated pulmonary pressure.     Radiology/Studies  Dg Chest 2 View  06/01/2014   CLINICAL DATA:  Heaviness and mid chest pain since April, history COPD, coronary disease post MI and CABG in 2010, planned stent placement 06/04/2014, former smoker, ischemic cardiomyopathy, chronic systolic CHF, hypertension, former smoker  EXAM: CHEST  2 VIEW  COMPARISON:  05/29/2014  FINDINGS: Enlargement of cardiac silhouette post median sternotomy, by history CABG.  Pulmonary vascular congestion.  Diffuse interstitial infiltrates compatible pulmonary edema and CHF.  Small bibasilar effusions greater on LEFT.  Bibasilar atelectasis.  Large hiatal hernia.  RIGHT perihilar nodular density, increasing counts acuity since previous exam, could be related to prominent pulmonary vascular structures but unable to completely exclude perihilar mass or less likely hilar adenopathy.  No pneumothorax.  Bones demineralized.  IMPRESSION: Large hiatal hernia.  Post CABG.  Diffuse interstitial infiltrates likely pulmonary edema.  Questionable RIGHT perihilar mass, with an area of mass like opacity more prominent than on previous exam; followup CT chest with contrast recommended to exclude tumor.    Electronically Signed   By: Lavonia Dana M.D.   On: 06/01/2014 15:36     Cardiac Cath 5/18:  Conclusion     Prox RCA lesion, 85% stenosed. In stent.  Prox Cx to Mid Cx lesion, 70% stenosed.  1st Mrg lesion, 80% stenosed.  Prox LAD to Mid LAD lesion, 50% stenosed.  1st Diag lesion, 90% stenosed.  Normal right heart pressures and LV filling pressures.  1. 3 vessel obstructive CAD 2. Patent LIMA graft to the LAD 3. Patent free RIMA graft from the mid LIMA to the second OM 4. Normal right heart pressures and LV filling pressures. 5. Mild AV gradient.   Recommendation: I would consider staged PCI of the proximal RCA and the first diagonal. Recommend starting P2Y12 and ASA. When cleared by Renal will bring back to lab for PCI. I would treat the first OM ostial disease medically.          ASSESSMENT AND PLAN  IOWA KAPPES is a 81 y.o. female with a history of CAD, ischemic cardiomyopathy, chronic systolic CHF, HTN, HLD, tobacco abuse, COPD, CKD, PAD, and mitral regurgitation presented 5/17 with unstable angina. S/p Cath 5/18 severe RCA and diag dx with  patent LIMA and RIMA graft.   1. Unstable angina  - 3 V CAD with patent graft but ungrafted RCA & D1 lesions noted -  renal function stable. She is s/p PIC to the prox RCA using a 3.5 mm x 24 Primier stent dilated to 4. 0 mm.  - continue ASA, plavix, statin, toprol XL, Imdur.   2. Chronic systolic and diastolic HF  -62/86/3817 EF 71-16%, grade 2 diastolic dysfunction, severe LAE; severe MR (probably ischemia mediated); mild to moderate AS; mild AI and TR; moderately elevated pulmonary pressure. Not on ACE secondary to low B/P and CRI.  3. ICM 4. HTN 5. CKD IV  - baseline Cr 1.7 -2.0.  6. COPD 7. Hyperlipidemia 8. PVD with bilateral ICA 60-79%  Plan: I checked her O2 sats this am on RA- she dropped down to 82 on RA. Will review with MD, she is on Lasix and I/O negative 6L. Abnormal CXR noted- doubt she could have  CT with contrast anytime soon.   Angelena Form PA-C 06/06/2014 8:49 AM  Attending Note:   The patient was seen and examined.  Agree with assessment and plan as noted above.  Changes made to the above note as needed.  I have personally reviewed the angiograms and the echocardiogram And talked with Dr. Angelena Form  1. CAD:  She is s/p PCI of the prox RCA.  The diag is a small vessel, long stenosis of moderate severity.  I would not attempt PCI of that branch.  She is having no symptoms of CP at this point.  Continue DAPT for 1 year  2. CHF:  Has diuresed 6 liters during this hospitalization .  She will need to go home on a dose of lasix that is higher than her previous home dose.  Her LV is dilated and EF is 30-35%.  I think that there is substantial risk in repairing the MV in an 81 yo female with this degree of LV dysfunction .   3. MR. :  Has severe MR but also in the setting of reduced LV function.  I do not think she is a good candidate for MR repair given these comorbidities.   4. Hypoxemia:  She may need to go home on O2.  Cardiac rehab to ambulate with her.   5. CKD :  Will need BMP checked in next week. Stable so far.   Probable DC today - seems to be stable from a cardiac standpoint.      Thayer Headings, Brooke Bonito., MD, [facility] 06/06/2014, 8:49 AM [address]  [place] Pager [phone]

## 2014-06-06 NOTE — Progress Notes (Signed)
The patient was not able to be discharged yesterday as a CT scan was required and this was done very late in the day. Please see discharge summary from yesterday. She is stable to be discharged home today.

## 2014-06-12 ENCOUNTER — Telehealth: Payer: Self-pay | Admitting: *Deleted

## 2014-06-12 NOTE — Telephone Encounter (Signed)
Pt recently discharged from hospital. Dr. Angelena Form would like pt to have office visit in about 3 weeks.  Message sent to scheduling. When contacted by scheduling pt reported she was feeling fine and did not want to come in for an office visit at this time. She reports she has a home health nurse that comes out to see her.  She has previously scheduled appt with Dr. Angelena Form on August 15, 2014 and will plan on being here for this appt.

## 2014-06-13 ENCOUNTER — Telehealth: Payer: Self-pay | Admitting: *Deleted

## 2014-06-13 NOTE — Telephone Encounter (Signed)
Spoke with pt and reviewed monitor results with her.  She reports she is feeling well.  Home health nurse and therapists see her frequently.

## 2014-06-14 ENCOUNTER — Other Ambulatory Visit: Payer: Self-pay | Admitting: Family Medicine

## 2014-06-14 ENCOUNTER — Ambulatory Visit
Admission: RE | Admit: 2014-06-14 | Discharge: 2014-06-14 | Disposition: A | Discharge status: Home / Self Care | Payer: Medicare Other | Source: Ambulatory Visit | Attending: Family Medicine | Admitting: Family Medicine

## 2014-06-14 DIAGNOSIS — R0989 Other specified symptoms and signs involving the circulatory and respiratory systems: Secondary | ICD-10-CM

## 2014-06-22 ENCOUNTER — Telehealth: Payer: Self-pay | Admitting: Cardiovascular Disease

## 2014-06-22 NOTE — Telephone Encounter (Signed)
New message     FYI Moving today's visit to next week so primary nurse can make last visit.  Primary nurse is out sick today.  Pt is doing good

## 2014-06-22 NOTE — Telephone Encounter (Signed)
OK. Thanks.

## 2014-06-25 ENCOUNTER — Other Ambulatory Visit: Payer: Self-pay

## 2014-06-25 DIAGNOSIS — E7800 Pure hypercholesterolemia, unspecified: Secondary | ICD-10-CM

## 2014-06-25 MED ORDER — PRAVASTATIN SODIUM 80 MG PO TABS: ORAL_TABLET | ORAL | Status: DC

## 2014-06-26 ENCOUNTER — Other Ambulatory Visit: Payer: Self-pay | Admitting: *Deleted

## 2014-06-26 ENCOUNTER — Telehealth: Payer: Self-pay | Admitting: Cardiovascular Disease

## 2014-06-26 DIAGNOSIS — E7800 Pure hypercholesterolemia, unspecified: Secondary | ICD-10-CM

## 2014-06-26 MED ORDER — PRAVASTATIN SODIUM 80 MG PO TABS: ORAL_TABLET | ORAL | Status: DC

## 2014-06-26 NOTE — Telephone Encounter (Signed)
New Message    Pt daughter would like to speak to RN about mothers nausea

## 2014-06-26 NOTE — Telephone Encounter (Signed)
Patient is having nausea and diarrhea according to daughter-in-law when she started drinking a protein like shake. Informed her that patient should stop drinking the shake if it is causing these symptoms. Daughter-in-law had questions about medications patient was taking. As she told me the medications, I explained what that medication is typically used for. Informed daughter-in-law to follow up with PCP if these symptoms continue. She verbalized understanding.

## 2014-06-27 ENCOUNTER — Telehealth: Payer: Self-pay | Admitting: Cardiovascular Disease

## 2014-06-27 NOTE — Telephone Encounter (Signed)
New Prob  Pt c/o BP issue: STAT if pt c/o blurred vision, one-sided weakness or slurred speech  1. What are your last 5 BP readings? 120/54 (sitting) 100/52 (standing) all taken today prior to taking medications  2. Are you having any other symptoms (ex. Dizziness, headache, blurred vision, passed out)? No  3. What is your BP issue? Nurse is questioning current prescribed blood pressure medications for pt  Please call.

## 2014-06-27 NOTE — Telephone Encounter (Signed)
[facility]  called regarding pt's BP medications. Donita states that pt is taking three different BP medications and pt's daughter thinks that pt is taking too many med's. Pt's BP this morning is 120/54 sitting and 100/52 standing before taking the medications this AM. Pt has no symptoms or C/O. Pt takes among other med's, Furosemide 40 mg twice a day, Hydralazine 10 mg 3 times a day and Metoprolol 25 mg daily.  Spoke with pt states she does not have any symptoms, she states that her kidney doctor wants for her BP to be up to 413 systolic. Pt states she space taking her medication out, because she can't take them all at one time. Pt is aware to continue taking her medications and to call the office if any symptoms.  Pt has an appointment with Dr. Angelena Form on August 3 rd 2016.

## 2014-07-02 ENCOUNTER — Other Ambulatory Visit: Payer: Self-pay | Admitting: Internal Medicine

## 2014-07-02 NOTE — Telephone Encounter (Signed)
Agree. Would not make any changes. Thanks, chris

## 2014-07-05 ENCOUNTER — Other Ambulatory Visit: Payer: Self-pay

## 2014-07-05 MED ORDER — CLOPIDOGREL BISULFATE 75 MG PO TABS: 75.0000 mg | ORAL_TABLET | Freq: Every day | ORAL | Status: DC

## 2014-07-05 MED ORDER — HYDRALAZINE HCL 10 MG PO TABS: 10.0000 mg | ORAL_TABLET | Freq: Three times a day (TID) | ORAL | Status: DC

## 2014-08-03 ENCOUNTER — Encounter (HOSPITAL_COMMUNITY): Payer: Self-pay

## 2014-08-03 ENCOUNTER — Inpatient Hospital Stay (HOSPITAL_COMMUNITY)
Admission: EM | Admit: 2014-08-03 | Discharge: 2014-08-15 | DRG: 287 | Disposition: A | Discharge status: Home Health | Payer: Medicare Other | Attending: Internal Medicine | Admitting: Internal Medicine

## 2014-08-03 ENCOUNTER — Emergency Department (HOSPITAL_COMMUNITY): Payer: Medicare Other

## 2014-08-03 DIAGNOSIS — I25110 Atherosclerotic heart disease of native coronary artery with unstable angina pectoris: Secondary | ICD-10-CM | POA: Diagnosis not present

## 2014-08-03 DIAGNOSIS — I10 Essential (primary) hypertension: Secondary | ICD-10-CM | POA: Diagnosis present

## 2014-08-03 DIAGNOSIS — Z6820 Body mass index (BMI) 20.0-20.9, adult: Secondary | ICD-10-CM

## 2014-08-03 DIAGNOSIS — I739 Peripheral vascular disease, unspecified: Secondary | ICD-10-CM | POA: Diagnosis present

## 2014-08-03 DIAGNOSIS — N179 Acute kidney failure, unspecified: Secondary | ICD-10-CM | POA: Insufficient documentation

## 2014-08-03 DIAGNOSIS — I509 Heart failure, unspecified: Secondary | ICD-10-CM | POA: Diagnosis not present

## 2014-08-03 DIAGNOSIS — R64 Cachexia: Secondary | ICD-10-CM | POA: Diagnosis present

## 2014-08-03 DIAGNOSIS — Z87891 Personal history of nicotine dependence: Secondary | ICD-10-CM | POA: Diagnosis not present

## 2014-08-03 DIAGNOSIS — N189 Chronic kidney disease, unspecified: Secondary | ICD-10-CM

## 2014-08-03 DIAGNOSIS — Z9981 Dependence on supplemental oxygen: Secondary | ICD-10-CM

## 2014-08-03 DIAGNOSIS — D638 Anemia in other chronic diseases classified elsewhere: Secondary | ICD-10-CM | POA: Insufficient documentation

## 2014-08-03 DIAGNOSIS — Z79899 Other long term (current) drug therapy: Secondary | ICD-10-CM

## 2014-08-03 DIAGNOSIS — E785 Hyperlipidemia, unspecified: Secondary | ICD-10-CM | POA: Diagnosis present

## 2014-08-03 DIAGNOSIS — E440 Moderate protein-calorie malnutrition: Secondary | ICD-10-CM | POA: Diagnosis present

## 2014-08-03 DIAGNOSIS — I5042 Chronic combined systolic (congestive) and diastolic (congestive) heart failure: Secondary | ICD-10-CM | POA: Diagnosis not present

## 2014-08-03 DIAGNOSIS — Z951 Presence of aortocoronary bypass graft: Secondary | ICD-10-CM

## 2014-08-03 DIAGNOSIS — R938 Abnormal findings on diagnostic imaging of other specified body structures: Secondary | ICD-10-CM

## 2014-08-03 DIAGNOSIS — E876 Hypokalemia: Secondary | ICD-10-CM | POA: Diagnosis not present

## 2014-08-03 DIAGNOSIS — I5041 Acute combined systolic (congestive) and diastolic (congestive) heart failure: Secondary | ICD-10-CM | POA: Diagnosis not present

## 2014-08-03 DIAGNOSIS — E871 Hypo-osmolality and hyponatremia: Secondary | ICD-10-CM | POA: Diagnosis not present

## 2014-08-03 DIAGNOSIS — Z955 Presence of coronary angioplasty implant and graft: Secondary | ICD-10-CM | POA: Diagnosis not present

## 2014-08-03 DIAGNOSIS — J449 Chronic obstructive pulmonary disease, unspecified: Secondary | ICD-10-CM | POA: Diagnosis present

## 2014-08-03 DIAGNOSIS — R0789 Other chest pain: Secondary | ICD-10-CM | POA: Diagnosis not present

## 2014-08-03 DIAGNOSIS — J90 Pleural effusion, not elsewhere classified: Secondary | ICD-10-CM

## 2014-08-03 DIAGNOSIS — Z7902 Long term (current) use of antithrombotics/antiplatelets: Secondary | ICD-10-CM

## 2014-08-03 DIAGNOSIS — R079 Chest pain, unspecified: Secondary | ICD-10-CM | POA: Diagnosis present

## 2014-08-03 DIAGNOSIS — I4891 Unspecified atrial fibrillation: Secondary | ICD-10-CM | POA: Diagnosis not present

## 2014-08-03 DIAGNOSIS — I5021 Acute systolic (congestive) heart failure: Secondary | ICD-10-CM | POA: Diagnosis not present

## 2014-08-03 DIAGNOSIS — I2510 Atherosclerotic heart disease of native coronary artery without angina pectoris: Secondary | ICD-10-CM | POA: Diagnosis present

## 2014-08-03 DIAGNOSIS — Z7982 Long term (current) use of aspirin: Secondary | ICD-10-CM

## 2014-08-03 DIAGNOSIS — R0602 Shortness of breath: Secondary | ICD-10-CM | POA: Diagnosis present

## 2014-08-03 DIAGNOSIS — I255 Ischemic cardiomyopathy: Secondary | ICD-10-CM | POA: Diagnosis present

## 2014-08-03 DIAGNOSIS — N184 Chronic kidney disease, stage 4 (severe): Secondary | ICD-10-CM | POA: Diagnosis present

## 2014-08-03 DIAGNOSIS — I480 Paroxysmal atrial fibrillation: Secondary | ICD-10-CM | POA: Diagnosis not present

## 2014-08-03 DIAGNOSIS — I340 Nonrheumatic mitral (valve) insufficiency: Secondary | ICD-10-CM | POA: Diagnosis present

## 2014-08-03 DIAGNOSIS — I5023 Acute on chronic systolic (congestive) heart failure: Secondary | ICD-10-CM | POA: Diagnosis not present

## 2014-08-03 DIAGNOSIS — I719 Aortic aneurysm of unspecified site, without rupture: Secondary | ICD-10-CM | POA: Diagnosis present

## 2014-08-03 DIAGNOSIS — I252 Old myocardial infarction: Secondary | ICD-10-CM

## 2014-08-03 DIAGNOSIS — I129 Hypertensive chronic kidney disease with stage 1 through stage 4 chronic kidney disease, or unspecified chronic kidney disease: Secondary | ICD-10-CM | POA: Diagnosis present

## 2014-08-03 DIAGNOSIS — I5043 Acute on chronic combined systolic (congestive) and diastolic (congestive) heart failure: Principal | ICD-10-CM | POA: Diagnosis present

## 2014-08-03 DIAGNOSIS — D631 Anemia in chronic kidney disease: Secondary | ICD-10-CM | POA: Diagnosis present

## 2014-08-03 DIAGNOSIS — I25738 Atherosclerosis of nonautologous biological coronary artery bypass graft(s) with other forms of angina pectoris: Secondary | ICD-10-CM | POA: Diagnosis not present

## 2014-08-03 LAB — BASIC METABOLIC PANEL
Anion gap: 14 (ref 5–15)
BUN: 30 mg/dL — ABNORMAL HIGH (ref 6–20)
CHLORIDE: 97 mmol/L — AB (ref 101–111)
CO2: 24 mmol/L (ref 22–32)
CREATININE: 1.72 mg/dL — AB (ref 0.44–1.00)
Calcium: 9.3 mg/dL (ref 8.9–10.3)
GFR calc Af Amer: 30 mL/min — ABNORMAL LOW (ref >60)
GFR calc non Af Amer: 26 mL/min — ABNORMAL LOW (ref >60)
GLUCOSE: 113 mg/dL — AB (ref 65–99)
POTASSIUM: 4.6 mmol/L (ref 3.5–5.1)
Sodium: 135 mmol/L (ref 135–145)

## 2014-08-03 LAB — CBC
HCT: 30.8 % — ABNORMAL LOW (ref 36.0–46.0)
Hemoglobin: 9.4 g/dL — ABNORMAL LOW (ref 12.0–15.0)
MCH: 24.6 pg — ABNORMAL LOW (ref 26.0–34.0)
MCHC: 30.5 g/dL (ref 30.0–36.0)
MCV: 80.6 fL (ref 78.0–100.0)
PLATELETS: 512 10*3/uL — AB (ref 150–400)
RBC: 3.82 MIL/uL — AB (ref 3.87–5.11)
RDW: 17 % — ABNORMAL HIGH (ref 11.5–15.5)
WBC: 7.2 10*3/uL (ref 4.0–10.5)

## 2014-08-03 LAB — BRAIN NATRIURETIC PEPTIDE: B NATRIURETIC PEPTIDE 5: 2801.4 pg/mL — AB (ref 0.0–100.0)

## 2014-08-03 LAB — I-STAT TROPONIN, ED: Troponin i, poc: 0.03 ng/mL (ref 0.00–0.08)

## 2014-08-03 MED ORDER — POTASSIUM CHLORIDE CRYS ER 10 MEQ PO TBCR
10.0000 meq | EXTENDED_RELEASE_TABLET | Freq: Every day | ORAL | Status: DC
  Administered 2014-08-04 – 2014-08-12 (×8): 10 meq via ORAL
  Filled 2014-08-03 (×11): qty 1

## 2014-08-03 MED ORDER — ENOXAPARIN SODIUM 30 MG/0.3ML ~~LOC~~ SOLN
30.0000 mg | Freq: Every day | SUBCUTANEOUS | Status: DC
  Administered 2014-08-04 – 2014-08-05 (×2): 30 mg via SUBCUTANEOUS
  Filled 2014-08-03 (×5): qty 0.3

## 2014-08-03 MED ORDER — SODIUM CHLORIDE 0.9 % IJ SOLN
3.0000 mL | Freq: Two times a day (BID) | INTRAMUSCULAR | Status: DC
  Administered 2014-08-04 – 2014-08-12 (×12): 3 mL via INTRAVENOUS

## 2014-08-03 MED ORDER — ACETAMINOPHEN 325 MG PO TABS
650.0000 mg | ORAL_TABLET | Freq: Every day | ORAL | Status: DC | PRN
  Administered 2014-08-08 – 2014-08-10 (×3): 650 mg via ORAL
  Filled 2014-08-03 (×3): qty 2

## 2014-08-03 MED ORDER — ALBUTEROL SULFATE (2.5 MG/3ML) 0.083% IN NEBU
2.5000 mg | INHALATION_SOLUTION | RESPIRATORY_TRACT | Status: DC | PRN
Start: 2014-08-03 — End: 2014-08-15
  Administered 2014-08-13 (×2): 2.5 mg via RESPIRATORY_TRACT
  Filled 2014-08-03 (×2): qty 3

## 2014-08-03 MED ORDER — ISOSORBIDE MONONITRATE ER 30 MG PO TB24
30.0000 mg | ORAL_TABLET | Freq: Every day | ORAL | Status: DC
  Administered 2014-08-04 – 2014-08-05 (×2): 30 mg via ORAL
  Filled 2014-08-03 (×4): qty 1

## 2014-08-03 MED ORDER — FUROSEMIDE 10 MG/ML IJ SOLN
40.0000 mg | Freq: Two times a day (BID) | INTRAMUSCULAR | Status: DC
  Administered 2014-08-04 – 2014-08-06 (×5): 40 mg via INTRAVENOUS
  Filled 2014-08-03 (×7): qty 4

## 2014-08-03 MED ORDER — PRAVASTATIN SODIUM 80 MG PO TABS
80.0000 mg | ORAL_TABLET | Freq: Every day | ORAL | Status: DC
  Administered 2014-08-04 – 2014-08-14 (×11): 80 mg via ORAL
  Filled 2014-08-03 (×12): qty 1

## 2014-08-03 MED ORDER — FUROSEMIDE 10 MG/ML IJ SOLN
40.0000 mg | Freq: Once | INTRAMUSCULAR | Status: AC
  Administered 2014-08-03: 40 mg via INTRAVENOUS
  Filled 2014-08-03: qty 4

## 2014-08-03 MED ORDER — NITROGLYCERIN 0.4 MG SL SUBL: 0.4000 mg | SUBLINGUAL_TABLET | SUBLINGUAL | Status: DC | PRN

## 2014-08-03 MED ORDER — SODIUM CHLORIDE 0.9 % IJ SOLN: 3.0000 mL | INTRAMUSCULAR | Status: DC | PRN

## 2014-08-03 MED ORDER — IPRATROPIUM-ALBUTEROL 0.5-2.5 (3) MG/3ML IN SOLN: 3.0000 mL | Freq: Four times a day (QID) | RESPIRATORY_TRACT | Status: DC | PRN

## 2014-08-03 MED ORDER — ONDANSETRON HCL 4 MG/2ML IJ SOLN
4.0000 mg | Freq: Four times a day (QID) | INTRAMUSCULAR | Status: DC | PRN
  Administered 2014-08-14: 4 mg via INTRAVENOUS
  Filled 2014-08-03: qty 2

## 2014-08-03 MED ORDER — HYDRALAZINE HCL 10 MG PO TABS
10.0000 mg | ORAL_TABLET | Freq: Three times a day (TID) | ORAL | Status: DC
  Administered 2014-08-04 – 2014-08-11 (×17): 10 mg via ORAL
  Filled 2014-08-03 (×25): qty 1

## 2014-08-03 MED ORDER — METOPROLOL TARTRATE 25 MG PO TABS
25.0000 mg | ORAL_TABLET | Freq: Two times a day (BID) | ORAL | Status: DC
  Administered 2014-08-04 – 2014-08-05 (×3): 25 mg via ORAL
  Filled 2014-08-03 (×7): qty 1

## 2014-08-03 MED ORDER — CLOPIDOGREL BISULFATE 75 MG PO TABS
75.0000 mg | ORAL_TABLET | Freq: Every day | ORAL | Status: DC
  Administered 2014-08-04 – 2014-08-15 (×11): 75 mg via ORAL
  Filled 2014-08-03 (×16): qty 1

## 2014-08-03 MED ORDER — IPRATROPIUM-ALBUTEROL 0.5-2.5 (3) MG/3ML IN SOLN
3.0000 mL | Freq: Four times a day (QID) | RESPIRATORY_TRACT | Status: DC
  Administered 2014-08-04 (×3): 3 mL via RESPIRATORY_TRACT
  Filled 2014-08-03 (×3): qty 3

## 2014-08-03 MED ORDER — ASPIRIN EC 81 MG PO TBEC
81.0000 mg | DELAYED_RELEASE_TABLET | Freq: Every day | ORAL | Status: DC
  Administered 2014-08-04 – 2014-08-15 (×11): 81 mg via ORAL
  Filled 2014-08-03 (×12): qty 1

## 2014-08-03 MED ORDER — SODIUM CHLORIDE 0.9 % IV SOLN: 250.0000 mL | INTRAVENOUS | Status: DC | PRN

## 2014-08-03 NOTE — ED Notes (Signed)
Robyn, PA at the bedside.

## 2014-08-03 NOTE — Progress Notes (Signed)
   08/03/14 2317  Vitals  Temp 98.2 F (36.8 C)  Temp Source Oral  BP (!) 136/91 mmHg  BP Location Left Arm  Pulse Rate 86  Pulse Rate Source Dinamap  Resp 18  Oxygen Therapy  SpO2 98 %  O2 Device Nasal Cannula  O2 Flow Rate (L/min) 2 L/min  Height and Weight  Height 5\' 6"  (1.676 m)  Weight 60.238 kg (132 lb 12.8 oz) (scale a)  Type of Scale Used Standing  BSA (Calculated - sq m) 1.67 sq meters  BMI (Calculated) 21.5  Weight in (lb) to have BMI = 25 154.6  Admitted pt to rm 3E05 from ED, oriented to room, call bell placed within reach, pt denied pain at this time. Admission orders carried out, assessment done. Will continue to monitor.

## 2014-08-03 NOTE — ED Provider Notes (Signed)
The patient is an 81 year old female, she has a history of congestive heart failure.  She has a history of ischemic cardiomyopathy, has had a recent echocardiogram in April showing 30-35% ejection fraction with severe hypokinesis diffusely. She has a recent heart catheterization where she had a in-stent restenosis in the right coronary artery, this required repeat stenting. The patient presents today with some shortness of breath with increased fluid retention, on exam she has pitting edema to the bilateral lower extremities left greater than right as well as rales at the bases. She does have a systolic rumbling murmur. She is in no distress, she is not hypoxic, she speaks in full sentences and has no abdominal pain and at this time has no chest pain. EKG shows left bundle branch block similar to old EKGs. Labs pending. Will give IV Lasix.   EKG Interpretation  Date/Time:  Friday August 03 2014 19:51:02 EDT Ventricular Rate:  81 PR Interval:  161 QRS Duration: 138 QT Interval:  426 QTC Calculation: 494 R Axis:   -58 Text Interpretation:  Sinus rhythm Ventricular premature complex Left bundle branch block Confirmed by Rykar Lebleu  MD, Chanin Frumkin ([id number]) on 08/03/2014 8:00:16 PM      Medical screening examination/treatment/procedure(s) were conducted as a shared visit with non-physician practitioner(s) and myself.  I personally evaluated the patient during the encounter.  Clinical Impression:   Final diagnoses:  CHF exacerbation         Noemi Chapel, MD 08/04/14 1045

## 2014-08-03 NOTE — ED Notes (Signed)
Per [facility], pt from home lives by self. Pt had c/o SOB and per EMS, talked to student the entire way to the hospital. SR on the monitor. RA sats 88%. Pt is on home oxygen with tubing all through the house. 97% on 2 liters. Had c/o some cp with pins/needles over the left side of her chest earlier today and took 1 NTG but did not recall the time. NAD at this time.

## 2014-08-03 NOTE — ED Notes (Signed)
Family at bedside. 

## 2014-08-03 NOTE — ED Provider Notes (Signed)
CSN: [id number]     Arrival date & time 08/03/14  1935 History   First MD Initiated Contact with Patient 08/03/14 1936     Chief Complaint  Patient presents with  . Shortness of Breath     (Consider location/radiation/quality/duration/timing/severity/associated sxs/prior Treatment) HPI Comments: 81 year old female presenting via EMS with shortness of breath 2 days. States yesterday she started to feel a little short of breath, however today, shortness of breath increased, worse when laying flat or exerting herself, relieved by resting and being on oxygen. She is on 2 L of oxygen at home. Whenever her oxygen is off, she develops a pressure in her chest and a sensation that she cannot breathe that is relieved by being put back on oxygen. Around 12 noon today, she developed "pins and needles pain" in the left side of her chest that immediately went away after she took a sublingual nitroglycerin. States she "feels like I need another stent put in my heart like last time". Over the past week, her legs have been increasingly swollen. She is feeling slightly nauseated. Denies fever, chills, vomiting, cough, diaphoresis. Per EMS- the pt was very talkative the entire ride to the ED and did not express any SOB while talking or en route.  Patient is a 81 y.o. female presenting with shortness of breath. The history is provided by the patient and the EMS personnel.  Shortness of Breath Associated symptoms: chest pain     Past Medical History  Diagnosis Date  . CAD (coronary artery disease)     a. 07/2008 Inf STEMI->RCA BMS;  b. 09/2008 CABGx2: LIMA->LAD, RIMA->OM as Y from LIMA;  b. 06/2011 MV EF 31%, bsaal to dist ant infarct, inf/inflat infarct with mild to mod peri-infarct ischemia [facility]).  . Ischemic cardiomyopathy     a. 06/2011 Echo: EF 40%, inf/inflat AK and lat HK, aortic sclerosis w/ mild AI, Mild to mod MR (prev mod-sev), Gr 1 DD, mild TR, RVSP 95mmHg  . Systolic CHF, chronic     a.  06/2011 EF 40%  . Hypertension   . Hyperlipidemia   . Hiatal hernia     large  . Tobacco abuse   . Cerebrovascular disease   . COPD (chronic obstructive pulmonary disease)     presumed   . Arterial disease     peripheral arterial disease with claudication - bilateral SFA disease with ABI's 0.4-0.5 bilaterally   . Mitral regurgitation     a. prev Mod-Sev, read as mild to mod by echo 06/2011 [facility]).  . CKD (chronic kidney disease), stage IV     born with 1 kidney  . Thyroid nodule 10/2012    left dominant nodule cytology:non-neoplastic goiter.   . Cataracts, both eyes     not surgical  . Personal history of colonic polyps - adenomas 05/09/2013  . Anemia   . Systolic HF (heart failure)   . Hypertensive renal disease   . Hypertensive heart disease with congestive heart failure   . Aorta aneurysm   . LVH (left ventricular hypertrophy)   . Arteriosclerotic vascular disease   . Bronchiectasis   . Atherosclerosis of abdominal aorta   . Heart murmur   . Shortness of breath dyspnea   . MI (myocardial infarction) 2010   Past Surgical History  Procedure Laterality Date  . Laprotomy for bowel obstruction    . Coronary artery bypass graft  09/2008    aortic thoraic anuerysm resecton 2010.   Marland Kitchen Coronary stent  placement  07/2008    BMS to RCA  . Flexible sigmoidoscopy N/A 05/09/2013    Procedure: FLEXIBLE SIGMOIDOSCOPY;  Surgeon: [name], MD;  Location: [facility];  Service: Endoscopy;  Laterality: N/A;  with possible hemorrhoid banding use gastrosope unsdeated  . Appendectomy    . Oophorectomy      one removed in 70's  . Hemorrhoid surgery    . Colonoscopy N/A 05/12/2013    Procedure: COLONOSCOPY;  Surgeon: [name], MD;  Location: [facility];  Service: Endoscopy;  Laterality: N/A;  . Cardiac catheterization N/A 05/30/2014    Procedure: Right/Left Heart Cath and Coronary/Graft Angiography;  Surgeon: Peter M Martinique, MD;  Location: [place] CV [facility];  Service:  Cardiovascular;  Laterality: N/A;  . Cardiac catheterization N/A 06/04/2014    Procedure: Coronary Stent Intervention;  Surgeon: Belva Crome, MD;  Location: [place] [facility];  Service: Cardiovascular;  Laterality: N/A;   Family History  Problem Relation Age of Onset  . Coronary artery disease Mother     extensive family history; several other family members.  . Heart disease Mother     CHF  . Coronary artery disease Brother   . Leukemia Sister   . Leukemia Sister   . Leukemia Father   . Colon cancer Neg Hx   . Pancreatic cancer Neg Hx   . Stomach cancer Neg Hx   . Rectal cancer Neg Hx   . Kidney cancer Brother   . Heart failure Mother   . Heart attack Sister   . Stroke Neg Hx    History  Substance Use Topics  . Smoking status: Former Smoker -- 1.00 packs/day for 60 years    Types: Cigarettes    Quit date: 07/12/2008  . Smokeless tobacco: Never Used  . Alcohol Use: No   OB History    No data available     Review of Systems  Respiratory: Positive for shortness of breath.   Cardiovascular: Positive for chest pain and leg swelling.  Gastrointestinal: Positive for nausea.  All other systems reviewed and are negative.     Allergies  Review of patient's allergies indicates no known allergies.  Home Medications   Prior to Admission medications   Medication Sig Start Date End Date Taking? Authorizing Provider  acetaminophen (TYLENOL) 325 MG tablet Take 650 mg by mouth daily as needed (pain).   Yes Historical Provider, MD  albuterol (PROVENTIL HFA;VENTOLIN HFA) 108 (90 BASE) MCG/ACT inhaler Inhale 2 puffs into the lungs every 4 (four) hours as needed for wheezing or shortness of breath.    Yes Historical Provider, MD  aspirin EC 81 MG tablet Take 81 mg by mouth daily.   Yes Historical Provider, MD  clopidogrel (PLAVIX) 75 MG tablet Take 1 tablet (75 mg total) by mouth daily with breakfast. 07/05/14  Yes Burnell Blanks, MD  Coenzyme Q-10 100 MG capsule Take 100  mg by mouth daily at 2 PM daily at 2 PM.    Yes Historical Provider, MD  furosemide (LASIX) 40 MG tablet Take 1 tablet (40 mg total) by mouth 2 (two) times daily. 06/05/14  Yes Debbe Odea, MD  hydrALAZINE (APRESOLINE) 10 MG tablet Take 1 tablet (10 mg total) by mouth 3 (three) times daily. 07/05/14  Yes Burnell Blanks, MD  isosorbide mononitrate (IMDUR) 30 MG 24 hr tablet Take 1 tablet (30 mg total) by mouth daily. 06/05/14  Yes Debbe Odea, MD  metoprolol tartrate (LOPRESSOR) 25 MG tablet Take 1  tablet (25 mg total) by mouth daily. Patient taking differently: Take 25 mg by mouth 2 (two) times daily.  06/05/14  Yes Debbe Odea, MD  nitroGLYCERIN (NITROSTAT) 0.4 MG SL tablet Place 1 tablet (0.4 mg total) under the tongue every 5 (five) minutes as needed. Patient taking differently: Place 0.4 mg under the tongue every 5 (five) minutes as needed for chest pain.  04/13/14  Yes Burnell Blanks, MD  potassium chloride (K-DUR,KLOR-CON) 10 MEQ tablet Take 1 tablet (10 mEq total) by mouth 2 (two) times daily. Patient taking differently: Take 10 mEq by mouth daily.  06/05/14  Yes Debbe Odea, MD  pravastatin (PRAVACHOL) 80 MG tablet TAKE ONE TABLET BY MOUTH ONCE DAILY IN THE EVENING Patient taking differently: Take 80 mg by mouth daily before supper.  06/26/14  Yes Burnell Blanks, MD   BP 133/77 mmHg  Pulse 82  Temp(Src) 98 F (36.7 C) (Oral)  Resp 22  SpO2 100% Physical Exam  Constitutional: She is oriented to person, place, and time. She appears well-developed and well-nourished. No distress.  HENT:  Head: Normocephalic and atraumatic.  Mouth/Throat: Oropharynx is clear and moist.  Eyes: Conjunctivae and EOM are normal. Pupils are equal, round, and reactive to light.  Neck: Normal range of motion. Neck supple. No JVD present.  Cardiovascular: Normal rate, regular rhythm, normal heart sounds and intact distal pulses.   + 1 pitting edema around ankles BL.  Pulmonary/Chest:  Effort normal. No respiratory distress. She has rales (bases BL).  Abdominal: Soft. Bowel sounds are normal. There is no tenderness.  Musculoskeletal: Normal range of motion. She exhibits no edema.  Neurological: She is alert and oriented to person, place, and time. She has normal strength. No sensory deficit.  Speech fluent, goal oriented. Moves extremities without ataxia. Equal grip strength bilateral.  Skin: Skin is warm and dry. She is not diaphoretic.  Psychiatric: She has a normal mood and affect. Her behavior is normal.  Nursing note and vitals reviewed.   ED Course  Procedures (including critical care time) Labs Review Labs Reviewed  CBC - Abnormal; Notable for the following:    RBC 3.82 (*)    Hemoglobin 9.4 (*)    HCT 30.8 (*)    MCH 24.6 (*)    RDW 17.0 (*)    Platelets 512 (*)    All other components within normal limits  BASIC METABOLIC PANEL - Abnormal; Notable for the following:    Chloride 97 (*)    Glucose, Bld 113 (*)    BUN 30 (*)    Creatinine, Ser 1.72 (*)    GFR calc non Af Amer 26 (*)    GFR calc Af Amer 30 (*)    All other components within normal limits  BRAIN NATRIURETIC PEPTIDE - Abnormal; Notable for the following:    B Natriuretic Peptide 2801.4 (*)    All other components within normal limits  I-STAT TROPOININ, ED    Imaging Review Dg Chest 2 View  08/03/2014   CLINICAL DATA:  Former smoker with current history of oxygen dependent COPD, presenting with left-sided chest pain and paresthesias. Prior CABG.  EXAM: CHEST  2 VIEW  COMPARISON:  06/14/2014 and earlier, including CT chest 06/05/2014.  FINDINGS: Prior sternotomy CABG. Cardiac silhouette markedly enlarged but stable. Prominent right pulmonary artery accounting for the opacity in the right hilum as identified on the prior CT. Large hiatal hernia, unchanged. Mild diffuse interstitial pulmonary edema with Kerley B-lines, new since the most recent  examination 06/14/2014. Interval increase in  size of a now moderately large right pleural effusion. Stable small left pleural effusion. New consolidation in the anterior right lower lobe. Osseous demineralization, thoracolumbar scoliosis, and exaggeration of the usual thoracic kyphosis.  IMPRESSION: 1. Mild CHF, with stable marked cardiomegaly and mild diffuse interstitial pulmonary edema. 2. Moderately large right pleural effusion, increased since 06/14/2014. Stable small left pleural effusion. (The patient has chronic bilateral effusions. ) 3. New consolidation in the anterior right lower lobe, passive atelectasis versus pneumonia. 4. Stable large hiatal hernia.   Electronically Signed   By: Evangeline Dakin M.D.   On: 08/03/2014 20:17     EKG Interpretation   Date/Time:  Friday August 03 2014 19:51:02 EDT Ventricular Rate:  81 PR Interval:  161 QRS Duration: 138 QT Interval:  426 QTC Calculation: 494 R Axis:   -58 Text Interpretation:  Sinus rhythm Ventricular premature complex Left  bundle branch block Confirmed by MILLER  MD, BRIAN ([id number]) on 08/03/2014  8:00:16 PM      MDM   Final diagnoses:  CHF exacerbation   Nontoxic appearing, NAD. AF VSS. No respiratory distress. Frequent admissions for similar symptoms. Extensive cardiac history. Rales at bilateral bases on exam along with peripheral edema. Plan to give IV Lasix pending kidney functions. Labs, chest x-ray pending.  IV Lasix given. Labs consistent with acute CHF exacerbation, also seen on chest x-ray. Patient remains comfortable with stable vital signs. Admit to hospitalist. I spoke with Dr. Tommi Rumps with cardiology who will evaluate patient on admission.  Discussed with attending Dr. Loreta Ave who also evaluated patient and agrees with plan of care.  Carman Ching, PA-C 08/03/14 2220  Noemi Chapel, MD 08/04/14 1045

## 2014-08-03 NOTE — Consult Note (Addendum)
Patient ID: [name] MRN: [id number], DOB/AGE: August 06, 1927   Admit date: 08/03/2014   Primary Physician: Leonard Downing, MD Primary Cardiologist: Dr. Angelena Form  Pt. Profile:  [name] is a 81 y.o. female with a history of CAD s/p CABG (09/2008, LIMA to LAD and free RIMA Y graft to OM off of LIMA with ortic root replacement) ischemic cardiomyopathy (30% to 35%, 02/4399), chronic systolic CHF, HTN, HLD, tobacco abuse, COPD on 2L oxygen, CKD, PAD, and severe mitral regurgitation who presents with chest pain and shortness of breath.   Problem List  Past Medical History  Diagnosis Date  . CAD (coronary artery disease)     a. 07/2008 Inf STEMI->RCA BMS;  b. 09/2008 CABGx2: LIMA->LAD, RIMA->OM as Y from LIMA;  b. 06/2011 MV EF 31%, bsaal to dist ant infarct, inf/inflat infarct with mild to mod peri-infarct ischemia [facility]).  . Ischemic cardiomyopathy     a. 06/2011 Echo: EF 40%, inf/inflat AK and lat HK, aortic sclerosis w/ mild AI, Mild to mod MR (prev mod-sev), Gr 1 DD, mild TR, RVSP 1mmHg  . Systolic CHF, chronic     a. 06/2011 EF 40%  . Hypertension   . Hyperlipidemia   . Hiatal hernia     large  . Tobacco abuse   . Cerebrovascular disease   . COPD (chronic obstructive pulmonary disease)     presumed   . Arterial disease     peripheral arterial disease with claudication - bilateral SFA disease with ABI's 0.4-0.5 bilaterally   . Mitral regurgitation     a. prev Mod-Sev, read as mild to mod by echo 06/2011 [facility]).  . CKD (chronic kidney disease), stage IV     born with 1 kidney  . Thyroid nodule 10/2012    left dominant nodule cytology:non-neoplastic goiter.   . Cataracts, both eyes     not surgical  . Personal history of colonic polyps - adenomas 05/09/2013  . Anemia   . Systolic HF (heart failure)   . Hypertensive renal disease   . Hypertensive heart disease with congestive heart failure   . Aorta aneurysm   . LVH (left  ventricular hypertrophy)   . Arteriosclerotic vascular disease   . Bronchiectasis   . Atherosclerosis of abdominal aorta   . Heart murmur   . Shortness of breath dyspnea   . MI (myocardial infarction) 2010    Past Surgical History  Procedure Laterality Date  . Laprotomy for bowel obstruction    . Coronary artery bypass graft  09/2008    aortic thoraic anuerysm resecton 2010.   Marland Kitchen Coronary stent placement  07/2008    BMS to RCA  . Flexible sigmoidoscopy N/A 05/09/2013    Procedure: FLEXIBLE SIGMOIDOSCOPY;  Surgeon: [name], MD;  Location: [place];  Service: Endoscopy;  Laterality: N/A;  with possible hemorrhoid banding use gastrosope unsdeated  . Appendectomy    . Oophorectomy      one removed in 70's  . Hemorrhoid surgery    . Colonoscopy N/A 05/12/2013    Procedure: COLONOSCOPY;  Surgeon: [name], MD;  Location: [facility];  Service: Endoscopy;  Laterality: N/A;  . Cardiac catheterization N/A 05/30/2014    Procedure: Right/Left Heart Cath and Coronary/Graft Angiography;  Surgeon: Peter M Martinique, MD;  Location: [place] CV LAB;  Service: Cardiovascular;  Laterality: N/A;  . Cardiac catheterization N/A 06/04/2014    Procedure: Coronary Stent Intervention;  Surgeon: Belva Crome, MD;  Location: [place] CV LAB;  Service: Cardiovascular;  Laterality: N/A;     Allergies  No Known Allergies  HPI  [name] is a 81 y.o. female with a history of CAD s/p CABG (09/2008, LIMA to LAD and free RIMA Y graft to OM off of LIMA with ortic root replacement) ischemic cardiomyopathy (30% to 35%, 03/8754), chronic systolic CHF, HTN, HLD, tobacco abuse, COPD on 2L oxygen, CKD, PAD, and severe mitral regurgitation who presents with chest pain and shortness of breath.   [name] has had multiple admissions over the past few months for CHF and CP.  She was most recently admitted from 5/17-5/25 when she underwent angiography which demonstrated 85% pRCA ISR, 70% prox to  mid LCx, OM1 80%, pLAD to mLAD 50%, D1 90%. Patent LIMA graft to the LAD and patent free RIMA graft from the mid LIMA to the second OM. Due to CKD, she underwent a staged procedure and had PCI of her RCA lesion on 06/04/14 (3.5 x 24 Promus Premier was post dilated to 4.0 mm in diameter). The diag is a small vessel, long stenosis of moderate severity, and was not felt to be a good candidate for PCI.   She presents today with dyspnea for the last few days, LE edema "real bad" for 2-3 days, on and off chest discomfort this week, and a sensation of left side chest pain that feld like "needles". She has associated nausea, PND, palpitations. The needles sensation is what she felt prior to her PCI in May. She took a nitro for this sensation and pain improved. No missed doses of medications including DAPT or lasix. No dietary indiscretion.   On arrival to the ER, she was hemodynamically stable. HR 82, RR 26, BP 135/58, 97% on 1.5L. ECG demonstrated NSR, LAD, LBBB. Single PVC.  Labs were notable for K 4.6, Cr 1.72, BNP 2801 (up from 851 on 05/29/14), POC TnI 0.03, hct 30.8. ECG demonstrated NSR, LAD, LBBB. Single PVC. CXR demonstrated mild diffuse interstitial edema, moderate R pleural effusion, small left pleural effusion, consolidation in anterior RLL (atelectasis vs. PNA). She was given 40mg  IV lasix and admitted to medicine.   Most recent echo 04/20/14 with EF 30-35%; G2DD; severe LAE; severe MR (probably ischemia mediated); mild to moderate AS; mild AI and TR; moderately elevated, pulmonary pressure. She also had Global hypokinesis with akinesis of the basal inferolateral wall.   Home Medications  Prior to Admission medications   Medication Sig Start Date End Date Taking? Authorizing Provider  acetaminophen (TYLENOL) 325 MG tablet Take 650 mg by mouth daily as needed (pain).   Yes Historical Provider, MD  albuterol (PROVENTIL HFA;VENTOLIN HFA) 108 (90 BASE) MCG/ACT inhaler Inhale 2 puffs into the lungs every  4 (four) hours as needed for wheezing or shortness of breath.    Yes Historical Provider, MD  aspirin EC 81 MG tablet Take 81 mg by mouth daily.   Yes Historical Provider, MD  clopidogrel (PLAVIX) 75 MG tablet Take 1 tablet (75 mg total) by mouth daily with breakfast. 07/05/14  Yes Burnell Blanks, MD  Coenzyme Q-10 100 MG capsule Take 100 mg by mouth daily at 2 PM daily at 2 PM.    Yes Historical Provider, MD  furosemide (LASIX) 40 MG tablet Take 1 tablet (40 mg total) by mouth 2 (two) times daily. 06/05/14  Yes Debbe Odea, MD  hydrALAZINE (APRESOLINE) 10 MG tablet Take 1 tablet (10 mg total) by mouth 3 (three) times daily.  07/05/14  Yes Burnell Blanks, MD  isosorbide mononitrate (IMDUR) 30 MG 24 hr tablet Take 1 tablet (30 mg total) by mouth daily. 06/05/14  Yes Debbe Odea, MD  metoprolol tartrate (LOPRESSOR) 25 MG tablet Take 1 tablet (25 mg total) by mouth daily. Patient taking differently: Take 25 mg by mouth 2 (two) times daily.  06/05/14  Yes Debbe Odea, MD  nitroGLYCERIN (NITROSTAT) 0.4 MG SL tablet Place 1 tablet (0.4 mg total) under the tongue every 5 (five) minutes as needed. Patient taking differently: Place 0.4 mg under the tongue every 5 (five) minutes as needed for chest pain.  04/13/14  Yes Burnell Blanks, MD  potassium chloride (K-DUR,KLOR-CON) 10 MEQ tablet Take 1 tablet (10 mEq total) by mouth 2 (two) times daily. Patient taking differently: Take 10 mEq by mouth daily.  06/05/14  Yes Debbe Odea, MD  pravastatin (PRAVACHOL) 80 MG tablet TAKE ONE TABLET BY MOUTH ONCE DAILY IN THE EVENING Patient taking differently: Take 80 mg by mouth daily before supper.  06/26/14  Yes Burnell Blanks, MD    Family History  Family History  Problem Relation Age of Onset  . Coronary artery disease Mother     extensive family history; several other family members.  . Heart disease Mother     CHF  . Coronary artery disease Brother   . Leukemia Sister   . Leukemia  Sister   . Leukemia Father   . Colon cancer Neg Hx   . Pancreatic cancer Neg Hx   . Stomach cancer Neg Hx   . Rectal cancer Neg Hx   . Kidney cancer Brother   . Heart failure Mother   . Heart attack Sister   . Stroke Neg Hx     Social History  History   Social History  . Marital Status: Widowed    Spouse Name: N/A  . Number of Children: Y  . Years of Education: N/A   Occupational History  . 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    Social History Main Topics  . Smoking status: Former Smoker -- 1.00 packs/day for 60 years    Types: Cigarettes    Quit date: 07/12/2008  . Smokeless tobacco: Never Used  . Alcohol Use: No  . Drug Use: No  . Sexual Activity: Not on file   Other Topics Concern  . Not on file   Social History Narrative   She lives in [place]. She is retired and widowed.      Review of Systems General:  No chills, fever, night sweats or weight changes.  Cardiovascular:  See HPI Dermatological: No rash, lesions/masses Respiratory: No cough, + dyspnea Urologic: No hematuria, dysuria Abdominal:   +nausea, - vomiting, diarrhea, bright red blood per rectum, melena, or hematemesis Neurologic:  No visual changes, wkns, changes in mental status. All other systems reviewed and are otherwise negative except as noted above.  Physical Exam  Blood pressure 127/107, pulse 83, temperature 98 F (36.7 C), temperature source Oral, resp. rate 26, SpO2 96 %.  General: Pleasant, NAD Psych: Normal affect. Neuro: Alert and oriented X 3. Moves all extremities spontaneously. HEENT: Normal  Neck: Supple without bruits. JVP to earlobe.  Lungs:  Mild resp distress. Decreased breath sounds at the bases. Rales 2/3 way up.  Heart: RRR no s3, s4. 3/6 holosystolic murmur across the precordium with radiation to the axilla Abdomen: Soft, non-tender, non-distended, BS + x 4.  Extremities: No clubbing, cyanosis. 2+ soft pitting edema to  knees. DP/PT/Radials 2+ and equal  bilaterally.  Labs  Troponin [facility] Test)  Recent Labs  08/03/14 2028  TROPIPOC 0.03   No results for input(s): CKTOTAL, CKMB, TROPONINI in the last 72 hours. Lab Results  Component Value Date   WBC 7.2 08/03/2014   HGB 9.4* 08/03/2014   HCT 30.8* 08/03/2014   MCV 80.6 08/03/2014   PLT 512* 08/03/2014    Recent Labs Lab 08/03/14 2021  NA 135  K 4.6  CL 97*  CO2 24  BUN 30*  CREATININE 1.72*  CALCIUM 9.3  GLUCOSE 113*   Lab Results  Component Value Date   CHOL 182 04/25/2014   HDL 75.50 04/25/2014   LDLCALC 88 04/25/2014   TRIG 92.0 04/25/2014   Lab Results  Component Value Date   DDIMER 0.87* 05/05/2014     Radiology/Studies  Dg Chest 2 View  08/03/2014   CLINICAL DATA:  Former smoker with current history of oxygen dependent COPD, presenting with left-sided chest pain and paresthesias. Prior CABG.  EXAM: CHEST  2 VIEW  COMPARISON:  06/14/2014 and earlier, including CT chest 06/05/2014.  FINDINGS: Prior sternotomy CABG. Cardiac silhouette markedly enlarged but stable. Prominent right pulmonary artery accounting for the opacity in the right hilum as identified on the prior CT. Large hiatal hernia, unchanged. Mild diffuse interstitial pulmonary edema with Kerley B-lines, new since the most recent examination 06/14/2014. Interval increase in size of a now moderately large right pleural effusion. Stable small left pleural effusion. New consolidation in the anterior right lower lobe. Osseous demineralization, thoracolumbar scoliosis, and exaggeration of the usual thoracic kyphosis.  IMPRESSION: 1. Mild CHF, with stable marked cardiomegaly and mild diffuse interstitial pulmonary edema. 2. Moderately large right pleural effusion, increased since 06/14/2014. Stable small left pleural effusion. (The patient has chronic bilateral effusions. ) 3. New consolidation in the anterior right lower lobe, passive atelectasis versus pneumonia. 4. Stable large hiatal hernia.    Electronically Signed   By: Evangeline Dakin M.D.   On: 08/03/2014 20:17   05/30/14 Cath     Prox RCA lesion, 85% stenosed. In stent.     Prox Cx to Mid Cx lesion, 70% stenosed.     1st Mrg lesion, 80% stenosed.     Prox LAD to Mid LAD lesion, 50% stenosed.     1st Diag lesion, 90% stenosed.     Normal right heart pressures and LV filling pressures.  1. 3 vessel obstructive CAD 2. Patent LIMA graft to the LAD 3. Patent free RIMA graft from the mid LIMA to the second OM 4. Normal right heart pressures and LV filling pressures. 5. Mild AV gradient.   Recommendation: I would consider staged PCI of the proximal RCA and the first diagonal. Recommend starting P2Y12 and ASA. When cleared by Renal will bring back to lab for PCI. I would treat the first OM ostial disease medically.    06/04/14 PCI     85% in-stent restenosis in the proximal RCA was reduced to 0% with TIMI grade 3 flow.     A 3.5 x 24 Promus Premier was post dilated to 4.0 mm in diameter.     46 cc of contrast was used.  Echo: 04/20/2014 Study Conclusions  - Left ventricle: The cavity size was mildly dilated. Wall thickness was normal. Systolic function was moderately to severely reduced. The estimated ejection fraction was in the range of 30% to 35%. Diffuse hypokinesis. There is akinesis of the basalinferolateral myocardium. Features are consistent  with a pseudonormal left ventricular filling pattern, with concomitant abnormal relaxation and increased filling pressure (grade 2 diastolic dysfunction). - Aortic valve: Valve mobility was restricted. There was mild to moderate stenosis. There was mild regurgitation. Valve area (VTI): 1.46 cm^2. Valve area (Vmax): 1.59 cm^2. Valve area (Vmean): 1.39 cm^2. - Mitral valve: Calcified annulus. There was severe regurgitation. Valve area by continuity equation (using LVOT flow): 1.85 cm^2. - Left atrium: The atrium was severely dilated. - Pulmonary  arteries: Systolic pressure was moderately increased. PA peak pressure: 42 mm Hg (S).  Impressions:  - Global hypokinesis with akinesis of the basal inferolateral wall; overall moderate to severe LV dysfunction; grade 2 diastolic dysfunction; severe LAE; severe MR (probably ischemia mediated); mild to moderate AS; mild AI and TR; moderately elevated pulmonary pressure.  ECG  08/03/14 @ 19:51: NSR, LAD, LBBB. Single PVC.  06/05/14 @ 7:04: NSR, LBBB like IVCD. LAD. STD and TW in V6 likely related to ICVD  ASSESSMENT AND PLAN  [name] is a 81 y.o. female with a history of CAD s/p CABG (09/2008, LIMA to LAD and free RIMA Y graft to OM off of LIMA with ortic root replacement) ischemic cardiomyopathy (30% to 35%, 01/6107), chronic systolic CHF, HTN, HLD, tobacco abuse, COPD on 2L oxygen, CKD, PAD, and severe mitral regurgitation who presents with chest pain and shortness of breath. I suspect that the HF is driven largely by the severe MR. She also has LVSD. Based on the circumflex and OM1 lesions, it is possible that this represents ischemic MR which might improve with revasc. She is not a candidate for surgical MV repair/replacement but might be a candidate for mitraclip. She has anginal symptoms that are consistent with her prior ischemic symptoms.   Given the extent of her renal insufficieny, it would be best to optimize volume status and then consider cardiac cath during the week. Without some therapy aimed at her severe MR (revasc +/- mitraclip), she will be bound to have multiple recurrent HF admissions. If these treatments are not options, it may be worth involving palliative care.   CHF: Acute on chronic systolic HF. Responding well to 40mg  IV lasix.  - continue metoprolol - continue imdur/hydral - lasix 40mg  IV BID  CAD:  - ASA - plavix - Coenzyme Q - metoprolol - imdur - pravachol - cycle troponins - continue cath early next week.   Tobi Bastos, MD 08/03/2014, 10:52 PM

## 2014-08-03 NOTE — ED Notes (Signed)
Patient transported to X-ray 

## 2014-08-03 NOTE — H&P (Signed)
[facility] History and Physical  [name]:[id number] DOB: 09/28/1927 DOA: 08/03/2014  Referring physician: ED physician PCP: Leonard Downing, MD   Chief Complaint: SOB, chest pressure   HPI:  [name] is an 81yo woman with PMH of CAD, systolic CHF, HTN, Heart murmur, COPD, CKD, anemia who presents for acute SOB and chest pressure.  She notes that she has been SOB all of today.  She is usually SOB when lying flat, but noticed that she was now SOB with sitting up and activity.  She has also noted increased swelling in the legs which will not go down.  She has had normal urinary output and notes no weight gain, but weight loss.  She noticed no trigger, has not had a change in diet and has been taking her medications as prescribed.  The chest pressure she notes as a sensation of not being able to draw a deep breath b/c something is pushing on her sternum. It is substernal and right sided, no radiation, came on at rest.  + nausea all day per her.  She denies dizziness, lightheadedness, syncope, radiation of chest pain, diaphoresis.  Further symptoms include chronic pain and swelling in her legs, burning pain in her feet due to the swelling and varicose veins.  She denied vomiting diarrhea, fever, chills, abdominal pain.   In the ED she has had a CXR which showed mild CHF, cardiomegaly, worsening right sided pleural effusion and no consolidation (no other signs of infection currently, no cough, sputum).  She had a BNP which was elevated to 2800 and a TnI of 0.03.  Last TTE in April showed EF of 35% and grade 2 diastolic dysfunction.  She was given IV lasix 40mg  with good urination.   Her last admission was in May of this year for chest pain.  She had a LHC and a stent placed.  She is on dual antiplatelet therapy.  During that admission, she also was noted to have LAD on a CT chest and she needs a repeat study.    Assessment and Plan: Acute combined CHF exacerbation, EF of 35%,  h/o CAD - Patient with acute SOB and leg swelling, normal urination.  Received lasix in the ED with good output per report - CXR showed mild CHF and worsening pleural effusions - Continue IV lasix 40mg  BID for now - Cardiology consult placed - Strict I/Os, daily weights - Telemetry - Consider repeat TTE for worsening - Continue aspirin, plavix, hydralazine, imdur, metoprolol (on prior to exacerbation), pravastatin - BNP elevated to 2800 - Consider repeat CXR after diuresis - O2 to maintain saturation > 90%, she does have concomitant COPD, so should avoid pulse ox > 92% if possible  Chest pain, pressure like, h/o CAD and CABG - She reports similar chest pain to previous angina and need for stenting in May - Troponin X 1 negative  - Trend troponin overnight - AM EKG ordered  Abnormal CT chest from May - Repeat CT in the AM for resolution of adenopathy (see previous discharge summary from May 2016)  HTN - continuing home meds, BP has been well controlled while here, 120s-140s/70s-90s - Continue hydralazine, imdur, metoprolol  HLD - Check lipid panel, last LDL was 88 - Continue pravastatin  COPD - She has not required her albuterol since starting O2, but she is wheezing today - Duonebs q6 hours while awake and prn   CKD - Renal function slightly worsened from last admission, but similar to previously reported baseline -  Possibly a component of cardiorenal syndrome - Treatment of HF as above, will need to trend BMET and monitor closely  Malnutrition to moderate degree - Heart healthy diet - Consider adding ensure if she is not getting adequate calories - Also concerning is weight loss and findings on previous CT scan, repeat CT scan as noted above.   DVT PPx: Lovenox  Diet: Heart Healthy  Radiological Exams on Admission: Dg Chest 2 View  08/03/2014   CLINICAL DATA:  Former smoker with current history of oxygen dependent COPD, presenting with left-sided chest pain and  paresthesias. Prior CABG.  EXAM: CHEST  2 VIEW  COMPARISON:  06/14/2014 and earlier, including CT chest 06/05/2014.  FINDINGS: Prior sternotomy CABG. Cardiac silhouette markedly enlarged but stable. Prominent right pulmonary artery accounting for the opacity in the right hilum as identified on the prior CT. Large hiatal hernia, unchanged. Mild diffuse interstitial pulmonary edema with Kerley B-lines, new since the most recent examination 06/14/2014. Interval increase in size of a now moderately large right pleural effusion. Stable small left pleural effusion. New consolidation in the anterior right lower lobe. Osseous demineralization, thoracolumbar scoliosis, and exaggeration of the usual thoracic kyphosis.  IMPRESSION: 1. Mild CHF, with stable marked cardiomegaly and mild diffuse interstitial pulmonary edema. 2. Moderately large right pleural effusion, increased since 06/14/2014. Stable small left pleural effusion. (The patient has chronic bilateral effusions. ) 3. New consolidation in the anterior right lower lobe, passive atelectasis versus pneumonia. 4. Stable large hiatal hernia.   Electronically Signed   By: Evangeline Dakin M.D.   On: 08/03/2014 20:17    Code Status: Full Family Communication: Pt and family at bedside Disposition Plan: Admit for further evaluation    Gilles Chiquito, MD [phone]  Review of Systems:  Constitutional: Negative for fever, chills and malaise/fatigue. Negative for diaphoresis.  HENT: Negative for hearing loss, ear pain Eyes: + for decreased vision due to cataracts, stable.  Negative for blurred vision, double vision Respiratory: + for SOB and wheezing Negative for cough, sputum production Cardiovascular: + for chest pressure, leg swelling, orthopnea Negative for palpitations, claudication, PND Gastrointestinal: + for nausea Negative for vomiting and abdominal pain. Negative for heartburn, constipation, blood in stool and melena.  Genitourinary: Negative for dysuria,  urgency, frequency Musculoskeletal: Negative for back pain, joint pain and falls.  Skin: Negative for itching and rash.  Neurological: Negative for dizziness and weakness, syncope Endo/Heme/Allergies: Negative for environmental allergies and polydipsia. Does not bruise/bleed easily.  Psychiatric/Behavioral: The patient is not nervous/anxious.      Past Medical History  Diagnosis Date  . CAD (coronary artery disease)     a. 07/2008 Inf STEMI->RCA BMS;  b. 09/2008 CABGx2: LIMA->LAD, RIMA->OM as Y from LIMA;  b. 06/2011 MV EF 31%, bsaal to dist ant infarct, inf/inflat infarct with mild to mod peri-infarct ischemia [facility]).  . Ischemic cardiomyopathy     a. 06/2011 Echo: EF 40%, inf/inflat AK and lat HK, aortic sclerosis w/ mild AI, Mild to mod MR (prev mod-sev), Gr 1 DD, mild TR, RVSP 48mmHg  . Systolic CHF, chronic     a. 06/2011 EF 40%  . Hypertension   . Hyperlipidemia   . Hiatal hernia     large  . Tobacco abuse   . Cerebrovascular disease   . COPD (chronic obstructive pulmonary disease)     presumed   . Arterial disease     peripheral arterial disease with claudication - bilateral SFA disease with ABI's 0.4-0.5 bilaterally   .  Mitral regurgitation     a. prev Mod-Sev, read as mild to mod by echo 06/2011 [facility]).  . CKD (chronic kidney disease), stage IV     born with 1 kidney  . Thyroid nodule 10/2012    left dominant nodule cytology:non-neoplastic goiter.   . Cataracts, both eyes     not surgical  . Personal history of colonic polyps - adenomas 05/09/2013  . Anemia   . Systolic HF (heart failure)   . Hypertensive renal disease   . Hypertensive heart disease with congestive heart failure   . Aorta aneurysm   . LVH (left ventricular hypertrophy)   . Arteriosclerotic vascular disease   . Bronchiectasis   . Atherosclerosis of abdominal aorta   . Heart murmur   . Shortness of breath dyspnea   . MI (myocardial infarction) 2010    Past Surgical History   Procedure Laterality Date  . Laprotomy for bowel obstruction    . Coronary artery bypass graft  09/2008    aortic thoraic anuerysm resecton 2010.   Marland Kitchen Coronary stent placement  07/2008    BMS to RCA  . Flexible sigmoidoscopy N/A 05/09/2013    Procedure: FLEXIBLE SIGMOIDOSCOPY;  Surgeon: [name], MD;  Location: [facility];  Service: Endoscopy;  Laterality: N/A;  with possible hemorrhoid banding use gastrosope unsdeated  . Appendectomy    . Oophorectomy      one removed in 70's  . Hemorrhoid surgery    . Colonoscopy N/A 05/12/2013    Procedure: COLONOSCOPY;  Surgeon: [name], MD;  Location: [facility] END[facility];  Service: Endoscopy;  Laterality: N/A;  . Cardiac catheterization N/A 05/30/2014    Procedure: Right/Left Heart Cath and Coronary/Graft Angiography;  Surgeon: Peter M Martinique, MD;  Location: [facility] CV [facility];  Service: Cardiovascular;  Laterality: N/A;  . Cardiac catheterization N/A 06/04/2014    Procedure: Coronary Stent Intervention;  Surgeon: Belva Crome, MD;  Location: [place];  Service: Cardiovascular;  Laterality: N/A;    Social History:  reports that she quit smoking about 6 years ago. Her smoking use included Cigarettes. She has a 60 pack-year smoking history. She has never used smokeless tobacco. She reports that she does not drink alcohol or use illicit drugs.  Confirmed with patient.   No Known Allergies  Family History  Problem Relation Age of Onset  . Coronary artery disease Mother     extensive family history; several other family members.  . Heart disease Mother     CHF  . Coronary artery disease Brother   . Leukemia Sister   . Leukemia Sister   . Leukemia Father   . Colon cancer Neg Hx   . Pancreatic cancer Neg Hx   . Stomach cancer Neg Hx   . Rectal cancer Neg Hx   . Kidney cancer Brother   . Heart failure Mother   . Heart attack Sister   . Stroke Neg Hx     Prior to Admission medications   Medication Sig Start Date End Date  Taking? Authorizing Provider  acetaminophen (TYLENOL) 325 MG tablet Take 650 mg by mouth daily as needed (pain).   Yes Historical Provider, MD  albuterol (PROVENTIL HFA;VENTOLIN HFA) 108 (90 BASE) MCG/ACT inhaler Inhale 2 puffs into the lungs every 4 (four) hours as needed for wheezing or shortness of breath.    Yes Historical Provider, MD  aspirin EC 81 MG tablet Take 81 mg by mouth daily.   Yes Historical Provider,  MD  clopidogrel (PLAVIX) 75 MG tablet Take 1 tablet (75 mg total) by mouth daily with breakfast. 07/05/14  Yes Burnell Blanks, MD  Coenzyme Q-10 100 MG capsule Take 100 mg by mouth daily at 2 PM daily at 2 PM.    Yes Historical Provider, MD  furosemide (LASIX) 40 MG tablet Take 1 tablet (40 mg total) by mouth 2 (two) times daily. 06/05/14  Yes Debbe Odea, MD  hydrALAZINE (APRESOLINE) 10 MG tablet Take 1 tablet (10 mg total) by mouth 3 (three) times daily. 07/05/14  Yes Burnell Blanks, MD  isosorbide mononitrate (IMDUR) 30 MG 24 hr tablet Take 1 tablet (30 mg total) by mouth daily. 06/05/14  Yes Debbe Odea, MD  metoprolol tartrate (LOPRESSOR) 25 MG tablet Take 1 tablet (25 mg total) by mouth daily. Patient taking differently: Take 25 mg by mouth 2 (two) times daily.  06/05/14  Yes Debbe Odea, MD  nitroGLYCERIN (NITROSTAT) 0.4 MG SL tablet Place 1 tablet (0.4 mg total) under the tongue every 5 (five) minutes as needed. Patient taking differently: Place 0.4 mg under the tongue every 5 (five) minutes as needed for chest pain.  04/13/14  Yes Burnell Blanks, MD  potassium chloride (K-DUR,KLOR-CON) 10 MEQ tablet Take 1 tablet (10 mEq total) by mouth 2 (two) times daily. Patient taking differently: Take 10 mEq by mouth daily.  06/05/14  Yes Debbe Odea, MD  pravastatin (PRAVACHOL) 80 MG tablet TAKE ONE TABLET BY MOUTH ONCE DAILY IN THE EVENING Patient taking differently: Take 80 mg by mouth daily before supper.  06/26/14  Yes Burnell Blanks, MD    Physical  Exam: Filed Vitals:   08/03/14 2145 08/03/14 2200 08/03/14 2215 08/03/14 2230  BP: 133/77 131/52 135/72 127/107  Pulse: 79 79 82 83  Temp:      TempSrc:      Resp: 23 23 21 26   SpO2: 95% 99% 100% 96%    Physical Exam  Constitutional: Appears thin and somewhat malnourished, no distress, answering questions appropriately.  HENT: Normocephalic. Thayer in place Eyes: Conjunctivae normal. No scleral icterus.  Neck: Normal ROM. Neck supple. No JVD noted CVS: RR, NR, S1/S2 +, + murmur noted Pulmonary: Patient with increased WOB, but able to speak in full sentences, effort for deep breathing was normal.  She had decreased breath sounds in the bases.  She had expiratory wheezing throughout.  No obvious crackles or rales.   Abdominal: Soft. BS +,  no distension, tenderness, rebound or guarding.  + small hernia on the left Musculoskeletal: + edema pitting to mid calf and tenderness.  Neuro: Alert. Oriented X 3 Skin: Skin is warm and dry. Significant varicosities in legs, legs are pale, no rash or warmth noted.  Psychiatric: Normal mood and affect. Behavior, judgment, thought content normal.   Labs on Admission:  Basic Metabolic Panel:  Recent Labs Lab 08/03/14 2021  NA 135  K 4.6  CL 97*  CO2 24  GLUCOSE 113*  BUN 30*  CREATININE 1.72*  CALCIUM 9.3   CBC:  Recent Labs Lab 08/03/14 2021  WBC 7.2  HGB 9.4*  HCT 30.8*  MCV 80.6  PLT 512*   POC TnI 0.03   EKG: LBBB, unchanged from previous.    If 7PM-7AM, please contact night-coverage [email or web address] Password [email or web address] 08/03/2014, 10:36 PM

## 2014-08-03 NOTE — ED Notes (Signed)
Pt returned from xray and Dr. Sabra Heck at the bedside.

## 2014-08-04 ENCOUNTER — Inpatient Hospital Stay (HOSPITAL_COMMUNITY): Payer: Medicare Other

## 2014-08-04 DIAGNOSIS — I509 Heart failure, unspecified: Secondary | ICD-10-CM

## 2014-08-04 LAB — TROPONIN I
TROPONIN I: 0.03 ng/mL (ref <0.031)
Troponin I: 0.04 ng/mL — ABNORMAL HIGH (ref <0.031)
Troponin I: 0.04 ng/mL — ABNORMAL HIGH (ref <0.031)

## 2014-08-04 LAB — BASIC METABOLIC PANEL
Anion gap: 10 (ref 5–15)
BUN: 28 mg/dL — AB (ref 6–20)
CO2: 29 mmol/L (ref 22–32)
CREATININE: 1.69 mg/dL — AB (ref 0.44–1.00)
Calcium: 8.9 mg/dL (ref 8.9–10.3)
Chloride: 99 mmol/L — ABNORMAL LOW (ref 101–111)
GFR calc Af Amer: 31 mL/min — ABNORMAL LOW (ref >60)
GFR, EST NON AFRICAN AMERICAN: 26 mL/min — AB (ref >60)
GLUCOSE: 87 mg/dL (ref 65–99)
Potassium: 3.9 mmol/L (ref 3.5–5.1)
Sodium: 138 mmol/L (ref 135–145)

## 2014-08-04 LAB — CBC
HEMATOCRIT: 30.3 % — AB (ref 36.0–46.0)
Hemoglobin: 9.3 g/dL — ABNORMAL LOW (ref 12.0–15.0)
MCH: 25 pg — ABNORMAL LOW (ref 26.0–34.0)
MCHC: 30.7 g/dL (ref 30.0–36.0)
MCV: 81.5 fL (ref 78.0–100.0)
PLATELETS: 380 10*3/uL (ref 150–400)
RBC: 3.72 MIL/uL — ABNORMAL LOW (ref 3.87–5.11)
RDW: 17 % — AB (ref 11.5–15.5)
WBC: 5.7 10*3/uL (ref 4.0–10.5)

## 2014-08-04 LAB — LIPID PANEL
Cholesterol: 153 mg/dL (ref 0–200)
HDL: 50 mg/dL (ref >40)
LDL CALC: 88 mg/dL (ref 0–99)
Total CHOL/HDL Ratio: 3.1 RATIO
Triglycerides: 75 mg/dL (ref <150)
VLDL: 15 mg/dL (ref 0–40)

## 2014-08-04 NOTE — Progress Notes (Signed)
@   06:15 pt had 16 beats run of vtach, pt resting in bed, no c/o pain. Tylene Fantasia NP notified.

## 2014-08-04 NOTE — Progress Notes (Signed)
Patient alert oriented, denies pain, no shortness of breath, v/s stable SR on the monitor. Daughter at bedside will continue to monitor patient.

## 2014-08-04 NOTE — Progress Notes (Signed)
Subjective:  She is feeling better today with less shortness of breath following diuresis overnight.  Currently sitting up in bed eating lunch.  Objective:  Vital Signs in the last 24 hours: BP 128/71 mmHg  Pulse 77  Temp(Src) 97.7 F (36.5 C) (Oral)  Resp 20  Ht 5\' 6"  (1.676 m)  Wt 58.2 kg (128 lb 4.9 oz)  BMI 20.72 kg/m2  SpO2 100%  Physical Exam: Thin, elderly female in no acute distress Lungs:  Reduced breath sounds with faint crackles Cardiac:  Regular rhythm, normal S1 and S2, no S3, loud holosystolic murmur of mitral regurgitation at the apex Abdomen:  Soft, nontender, no masses Extremities:  No edema present  Intake/Output from previous day: 07/22 0701 - 07/23 0700 In: 0  Out: 2300 [Urine:2300]  Weight Filed Weights   08/03/14 2317 08/04/14 0456  Weight: 60.238 kg (132 lb 12.8 oz) 58.2 kg (128 lb 4.9 oz)    Lab Results: Basic Metabolic Panel:  Recent Labs  08/03/14 2021 08/04/14 0543  NA 135 138  K 4.6 3.9  CL 97* 99*  CO2 24 29  GLUCOSE 113* 87  BUN 30* 28*  CREATININE 1.72* 1.69*   CBC:  Recent Labs  08/03/14 2021 08/04/14 1120  WBC 7.2 5.7  HGB 9.4* 9.3*  HCT 30.8* 30.3*  MCV 80.6 81.5  PLT 512* 380   Cardiac Enzymes: Troponin (Point of Care Test)  Recent Labs  08/03/14 2028  TROPIPOC 0.03   Cardiac Panel (last 3 results)  Recent Labs  08/04/14 0008 08/04/14 0543  TROPONINI 0.03 0.04*    Telemetry: Sinus rhythm  Assessment/Plan:  1.  Acute systolic congestive heart failure 2.  Severe mitral regurgitation 3.  Coronary artery disease with previous stenting 4.  Stage 3-4 chronic kidney disease  Recommendations:  She clinically is better today with improvement in her dyspnea.  Echo shows severe mitral regurgitation with depressed ejection fraction.  She certainly will need a TEE to assess whether she would be a candidate for mitral clipping.  In addition will require catheterization early next week.  Clinically much better  and continue intravenous diuresis.      Kerry Hough  MD [facility] Card[facility]  08/04/2014, 12:50 PM

## 2014-08-04 NOTE — Progress Notes (Signed)
Echocardiogram 2D Echocardiogram has been performed.  Tresa Res 08/04/2014, 10:48 AM

## 2014-08-04 NOTE — Progress Notes (Signed)
[facility] PROGRESS NOTE  [name] WNU:[id number] DOB: 02-24-1927 DOA: 08/03/2014 PCP: Leonard Downing, MD  Assessment/Plan: 1. Acute on chronic combined systolic and diastolic congestive heart failure. -Patient presenting with clinical signs and symptoms consistent with acute CHF -Her last transthoracic echocardiogram was performed on 04/20/2014 that revealed an EF of 30-35% with diffuse hypokinesis as well as grade 2 diastolic dysfunction. -She was seen and evaluated by cardiology feeling that severe MR contributing to CHF. -She was started on Lasix 40 mg IV twice a day with good urine output. She has a net negative fluid balance of 2.6 L, diuresing 2.3 L overnight -Continue to follow ins and outs along with weights  2.  Coronary artery disease. -Patient had reported having chest pain overnight. Troponins negative. -She had a cardiac catheterizations done on 06/04/2014 and 05/30/2014 showing 85% in-stent restenosis in the proximal RCA, proximal circumflex to mid circumflex 70% stenosis, first marginal lesion 80% stenosis, first on the lesion 90% stenosis in proximal LAD to mid LAD lesion 50% stenosis. She had stenting with drug-eluting stent on 06/04/2014, with plans for staged PCI given chronic kidney disease.  -Cardiology felt that circumflex and first marginal stenosis could be causing ischemic MR -Plan for cardiac catheterization early next week -Continue aspirin, Plavix, beta blocker, nitrate, statin  3.  Pleural effusions.  -Likely secondary to congestive heart failure -Previous imaging studies of chest have shown chronic pleural effusions -Chest x-ray on admission showing moderately large right pleural effusion that has increased since 06/14/2014 -Perform ultrasound-guided thoracentesis  4.  Mitral valve regurgitation -Transthoracic echocardiogram from 04/20/2014 showing severe regurgitation -Cardiology consulted, felt would benefit from reperfusion  therapy -2-D echo repeated on this admission, results pending  5. Hypertension - Blood pressures are stable, continue hydralazine 10 mg by mouth 3 times a day, metoprolol 25 Milli grams by mouth twice a day  6.  Dyslipidemia -Continue pravastatin 80 mg by mouth daily  Code Status: Full code Family Communication: Spoke with her daughter was present at bedside Disposition Plan: X-ray discharge home when medically stable   Consultants:  Radiology  Procedures:    HPI/Subjective: Patient is a pleasant 81 year old female with an established history of coronary artery disease, chronic combined systolic and diastolic congestive heart failure, hypertension, dyslipidemia, admitted to the medicine service on 08/03/2014 when she presented with complaints of chest pain associate with shortness of breath. Initial workup included a two-view chest x-ray that revealed findings consistent with CHF with cardiomegaly and diffuse interstitial pulmonary edema. Also noted was a moderately large right pleural effusion, that had increased in size compared to exam on 06/14/2014. Per radiology patient having chronic bilateral effusions. Troponins were stable at 0.03 and 0.04. Symptoms felt to be due to acute on chronic systolic congestive heart failure as she was started on Lasix 40 mg IV twice a day. She was evaluated by cardiology who recommended optimizing volume status prior to undergoing heart cath next week.   Objective: Filed Vitals:   08/04/14 0456  BP: 113/51  Pulse: 18  Temp: 97.9 F (36.6 C)  Resp: 18    Intake/Output Summary (Last 24 hours) at 08/04/14 1055 Last data filed at 08/04/14 1026  Gross per 24 hour  Intake      0 ml  Output   2600 ml  Net  -2600 ml   Filed Weights   08/03/14 2317 08/04/14 0456  Weight: 60.238 kg (132 lb 12.8 oz) 58.2 kg (128 lb 4.9 oz)    Exam:   General:  Patient states feeling little better, has ongoing shortness of breath  Cardiovascular: Regular  rate and rhythm normal S1-S2 no murmurs rubs or gallops, having 2+ bilateral extremity pitting edema  Respiratory: Having crackles to bilateral bases, positive rales, on supplemental oxygen  Abdomen: Soft nontender nondistended  Musculoskeletal: 2+ edema to lower extremities bilaterally  Data Reviewed: Basic Metabolic Panel:  Recent Labs Lab 08/03/14 2021 08/04/14 0543  NA 135 138  K 4.6 3.9  CL 97* 99*  CO2 24 29  GLUCOSE 113* 87  BUN 30* 28*  CREATININE 1.72* 1.69*  CALCIUM 9.3 8.9   Liver Function Tests: No results for input(s): AST, ALT, ALKPHOS, BILITOT, PROT, ALBUMIN in the last 168 hours. No results for input(s): LIPASE, AMYLASE in the last 168 hours. No results for input(s): AMMONIA in the last 168 hours. CBC:  Recent Labs Lab 08/03/14 2021  WBC 7.2  HGB 9.4*  HCT 30.8*  MCV 80.6  PLT 512*   Cardiac Enzymes:  Recent Labs Lab 08/04/14 0008 08/04/14 0543  TROPONINI 0.03 0.04*   BNP (last 3 results)  Recent Labs  05/22/14 0409 05/29/14 1000 08/03/14 2021  BNP 801.8* 851.1* 2801.4*    ProBNP (last 3 results) No results for input(s): PROBNP in the last 8760 hours.  CBG: No results for input(s): GLUCAP in the last 168 hours.  No results found for this or any previous visit (from the past 240 hour(s)).   Studies: Dg Chest 2 View  08/03/2014   CLINICAL DATA:  Former smoker with current history of oxygen dependent COPD, presenting with left-sided chest pain and paresthesias. Prior CABG.  EXAM: CHEST  2 VIEW  COMPARISON:  06/14/2014 and earlier, including CT chest 06/05/2014.  FINDINGS: Prior sternotomy CABG. Cardiac silhouette markedly enlarged but stable. Prominent right pulmonary artery accounting for the opacity in the right hilum as identified on the prior CT. Large hiatal hernia, unchanged. Mild diffuse interstitial pulmonary edema with Kerley B-lines, new since the most recent examination 06/14/2014. Interval increase in size of a now moderately  large right pleural effusion. Stable small left pleural effusion. New consolidation in the anterior right lower lobe. Osseous demineralization, thoracolumbar scoliosis, and exaggeration of the usual thoracic kyphosis.  IMPRESSION: 1. Mild CHF, with stable marked cardiomegaly and mild diffuse interstitial pulmonary edema. 2. Moderately large right pleural effusion, increased since 06/14/2014. Stable small left pleural effusion. (The patient has chronic bilateral effusions. ) 3. New consolidation in the anterior right lower lobe, passive atelectasis versus pneumonia. 4. Stable large hiatal hernia.   Electronically Signed   By: Evangeline Dakin M.D.   On: 08/03/2014 20:17    Scheduled Meds: . aspirin EC  81 mg Oral Daily  . clopidogrel  75 mg Oral Q breakfast  . enoxaparin (LOVENOX) injection  30 mg Subcutaneous Daily  . furosemide  40 mg Intravenous Q12H  . hydrALAZINE  10 mg Oral TID  . ipratropium-albuterol  3 mL Nebulization Q6H  . isosorbide mononitrate  30 mg Oral Daily  . metoprolol tartrate  25 mg Oral BID  . potassium chloride  10 mEq Oral Daily  . pravastatin  80 mg Oral QAC supper  . sodium chloride  3 mL Intravenous Q12H   Continuous Infusions:   Active Problems:   Dyslipidemia   Essential hypertension   CAD- RCA BMS July 2010, CABG x 2 and AO root Sept 2010   Chronic combined systolic and diastolic heart failure, NYHA class 2   COPD (chronic obstructive pulmonary disease)  CKD (chronic kidney disease), stage IV   Acute systolic congestive heart failure   Pain in the chest   Malnutrition of moderate degree   Acute systolic CHF (congestive heart failure), NYHA class 4    Time spent: 35 min    Kelvin Cellar  [facility] Pager [phone]. If 7PM-7AM, please contact night-coverage at [email or web address], password [facility] 08/04/2014, 10:55 AM  LOS: 1 day

## 2014-08-05 ENCOUNTER — Inpatient Hospital Stay (HOSPITAL_COMMUNITY): Payer: Medicare Other

## 2014-08-05 LAB — BASIC METABOLIC PANEL
Anion gap: 9 (ref 5–15)
BUN: 28 mg/dL — AB (ref 6–20)
CHLORIDE: 98 mmol/L — AB (ref 101–111)
CO2: 30 mmol/L (ref 22–32)
CREATININE: 1.69 mg/dL — AB (ref 0.44–1.00)
Calcium: 8.4 mg/dL — ABNORMAL LOW (ref 8.9–10.3)
GFR calc Af Amer: 31 mL/min — ABNORMAL LOW (ref >60)
GFR calc non Af Amer: 26 mL/min — ABNORMAL LOW (ref >60)
GLUCOSE: 81 mg/dL (ref 65–99)
POTASSIUM: 3.8 mmol/L (ref 3.5–5.1)
Sodium: 137 mmol/L (ref 135–145)

## 2014-08-05 NOTE — Progress Notes (Signed)
Subjective:  Sitting up at side of bed and feels better.  Weight is down further.  Renal function is stable and she has diuresed well.  No shortness of breath.  Objective:  Vital Signs in the last 24 hours: BP 121/84 mmHg  Pulse 79  Temp(Src) 97.9 F (36.6 C) (Oral)  Resp 18  Ht 5\' 6"  (1.676 m)  Wt 56.337 kg (124 lb 3.2 oz)  BMI 20.06 kg/m2  SpO2 98%  Physical Exam: Thin, elderly female in no acute distress Lungs:  Relatively clear today  Cardiac:  Regular rhythm, normal S1 and S2, no S3, loud holosystolic murmur of mitral regurgitation at the apex Abdomen:  Soft, nontender, no masses Extremities:  No edema present  Intake/Output from previous day: 07/23 0701 - 07/24 0700 In: 720 [P.O.:720] Out: 2450 [Urine:2450]  Weight Filed Weights   08/03/14 2317 08/04/14 0456 08/05/14 0513  Weight: 60.238 kg (132 lb 12.8 oz) 58.2 kg (128 lb 4.9 oz) 56.337 kg (124 lb 3.2 oz)    Lab Results: Basic Metabolic Panel:  Recent Labs  08/04/14 0543 08/05/14 0331  NA 138 137  K 3.9 3.8  CL 99* 98*  CO2 29 30  GLUCOSE 87 81  BUN 28* 28*  CREATININE 1.69* 1.69*   CBC:  Recent Labs  08/03/14 2021 08/04/14 1120  WBC 7.2 5.7  HGB 9.4* 9.3*  HCT 30.8* 30.3*  MCV 80.6 81.5  PLT 512* 380   Cardiac Enzymes: Troponin (Point of Care Test)  Recent Labs  08/03/14 2028  TROPIPOC 0.03   Cardiac Panel (last 3 results)  Recent Labs  08/04/14 0008 08/04/14 0543 08/04/14 1120  TROPONINI 0.03 0.04* 0.04*    Telemetry: Sinus rhythm  Assessment/Plan:  1.  Acute systolic congestive heart failure 2.  Severe mitral regurgitation 3.  Coronary artery disease with previous stenting 4.  Stage 3-4 chronic kidney disease  Recommendations:  She is clinically much better and is diuresed without heart failure.  Keep any by mouth after midnight for possible catheterization versus TEE tomorrow.      Kerry Hough  MD [facility]  08/05/2014, 11:45 AM

## 2014-08-05 NOTE — Progress Notes (Signed)
[facility] PROGRESS NOTE  [name] WUX:[id number] DOB: [birth year] DOA: 08/03/2014 PCP: Leonard Downing, MD  Assessment/Plan: 1. Acute on chronic combined systolic and diastolic congestive heart failure. -Patient presenting with clinical signs and symptoms consistent with acute CHF -Her last transthoracic echocardiogram was performed on 04/20/2014 that revealed an EF of 30-35% with diffuse hypokinesis as well as grade 2 diastolic dysfunction. -She was seen and evaluated by cardiology feeling that severe MR contributing to CHF. -She was started on Lasix 40 mg IV twice a day with good urine output. She has a net negative fluid balance of 2.6 L, diuresing 2.3 L overnight -Her weight coming down from 58.2 kg to 56 kg this morning. She has a net negative fluid balance of 4.59 L, with urine output of 2.4 L past 24 hours. -Clinically she seems to be doing much better today.  2.  Coronary artery disease. -Patient had reported having chest pain overnight. Troponins negative. -She had a cardiac catheterizations done on 06/04/2014 and 05/30/2014 showing 85% in-stent restenosis in the proximal RCA, proximal circumflex to mid circumflex 70% stenosis, first marginal lesion 80% stenosis, first on the lesion 90% stenosis in proximal LAD to mid LAD lesion 50% stenosis. She had stenting with drug-eluting stent on 06/04/2014, with plans for staged PCI given chronic kidney disease.  -Cardiology felt that circumflex and first marginal stenosis could be causing ischemic MR -Plan for cardiac catheterization early next week -Continue aspirin, Plavix, beta blocker, nitrate, statin -She will be made NPO after midnight  3.  Pleural effusions.  -Likely secondary to congestive heart failure -Previous imaging studies of chest have shown chronic pleural effusions -Chest x-ray on admission showing moderately large right pleural effusion that has increased since 06/14/2014 -Repeat CXR showing  improvement as clinically she seems to be doing much better. Will continue with diuresis.  4.  Mitral valve regurgitation -Transthoracic echocardiogram from 04/20/2014 showing severe regurgitation -Cardiology consulted, felt would benefit from reperfusion therapy -2-D echo performed on 08/04/2014 showing ejection fraction of 30% with diffuse hypokinesis. There is severe mitral regurgitation   5. Hypertension - Blood pressures are stable, continue hydralazine 10 mg by mouth 3 times a day, metoprolol 25 Milli grams by mouth twice a day  6.  Dyslipidemia -Continue pravastatin 80 mg by mouth daily  Code Status: Full code Family Communication: Spoke with her daughter was present at bedside Disposition Plan: Plan for heart cath early this week   Consultants:  Radiology  Procedures:  Transthoracic echocardiogram impression: Left ventricle: The cavity size was moderately dilated. Wall thickness was normal. The estimated ejection fraction was 30%. Diffuse hypokinesis. Findings consistent with left ventricular diastolic dysfunction. Doppler parameters are consistent with high ventricular filling pressure. Mildly thickened leaflets . There was severe regurgitation.  HPI/Subjective: Patient is a pleasant 81 year old female with an established history of coronary artery disease, chronic combined systolic and diastolic congestive heart failure, hypertension, dyslipidemia, admitted to the medicine service on 08/03/2014 when she presented with complaints of chest pain associate with shortness of breath. Initial workup included a two-view chest x-ray that revealed findings consistent with CHF with cardiomegaly and diffuse interstitial pulmonary edema. Also noted was a moderately large right pleural effusion, that had increased in size compared to exam on 06/14/2014. Per radiology patient having chronic bilateral effusions. Troponins were stable at 0.03 and 0.04. Symptoms felt to be due to  acute on chronic systolic congestive heart failure as she was started on Lasix 40 mg IV twice a day. She was evaluated  by cardiology who recommended optimizing volume status prior to undergoing heart cath next week.   Objective: Filed Vitals:   08/05/14 1034  BP: 121/84  Pulse:   Temp:   Resp:     Intake/Output Summary (Last 24 hours) at 08/05/14 1201 Last data filed at 08/05/14 1038  Gross per 24 hour  Intake    720 ml  Output   2950 ml  Net  -2230 ml   Filed Weights   08/03/14 2317 08/04/14 0456 08/05/14 0513  Weight: 60.238 kg (132 lb 12.8 oz) 58.2 kg (128 lb 4.9 oz) 56.337 kg (124 lb 3.2 oz)    Exam:   General:  Patient states feeling little better, has ongoing shortness of breath  Cardiovascular: Regular rate and rhythm normal S1-S2 no murmurs rubs or gallops, improvement to lower extremity edema  Respiratory: Having crackles to bilateral bases, positive rales, on supplemental oxygen  Abdomen: Soft nontender nondistended  Musculoskeletal: 1+ LE edema  Data Reviewed: Basic Metabolic Panel:  Recent Labs Lab 08/03/14 2021 08/04/14 0543 08/05/14 0331  NA 135 138 137  K 4.6 3.9 3.8  CL 97* 99* 98*  CO2 24 29 30   GLUCOSE 113* 87 81  BUN 30* 28* 28*  CREATININE 1.72* 1.69* 1.69*  CALCIUM 9.3 8.9 8.4*   Liver Function Tests: No results for input(s): AST, ALT, ALKPHOS, BILITOT, PROT, ALBUMIN in the last 168 hours. No results for input(s): LIPASE, AMYLASE in the last 168 hours. No results for input(s): AMMONIA in the last 168 hours. CBC:  Recent Labs Lab 08/03/14 2021 08/04/14 1120  WBC 7.2 5.7  HGB 9.4* 9.3*  HCT 30.8* 30.3*  MCV 80.6 81.5  PLT 512* 380   Cardiac Enzymes:  Recent Labs Lab 08/04/14 0008 08/04/14 0543 08/04/14 1120  TROPONINI 0.03 0.04* 0.04*   BNP (last 3 results)  Recent Labs  05/22/14 0409 05/29/14 1000 08/03/14 2021  BNP 801.8* 851.1* 2801.4*    ProBNP (last 3 results) No results for input(s): PROBNP in the last  8760 hours.  CBG: No results for input(s): GLUCAP in the last 168 hours.  No results found for this or any previous visit (from the past 240 hour(s)).   Studies: Dg Chest 2 View  08/05/2014   CLINICAL DATA:  Pleural effusion, shortness of breath  EXAM: CHEST  2 VIEW  COMPARISON:  08/03/2014  FINDINGS: Moderate enlargement of the cardiac silhouette noted. Hyperinflation and coarsened interstitial opacities suggest emphysema with possible superimposed interstitial edema. Bilateral hilar prominence with tapering may suggest pulmonary arterial hypertension. Small pleural effusions. Bones are subjectively osteopenic. Patchy bibasilar opacities are most likely compressive atelectasis. Atheromatous aortic calcification noted.  IMPRESSION: Probable emphysematous change with cardiomegaly and possible superimposed interstitial edema.  Small pleural effusions.   Electronically Signed   By: Conchita Paris M.D.   On: 08/05/2014 09:55   Dg Chest 2 View  08/03/2014   CLINICAL DATA:  Former smoker with current history of oxygen dependent COPD, presenting with left-sided chest pain and paresthesias. Prior CABG.  EXAM: CHEST  2 VIEW  COMPARISON:  06/14/2014 and earlier, including CT chest 06/05/2014.  FINDINGS: Prior sternotomy CABG. Cardiac silhouette markedly enlarged but stable. Prominent right pulmonary artery accounting for the opacity in the right hilum as identified on the prior CT. Large hiatal hernia, unchanged. Mild diffuse interstitial pulmonary edema with Kerley B-lines, new since the most recent examination 06/14/2014. Interval increase in size of a now moderately large right pleural effusion. Stable small left pleural effusion. New  consolidation in the anterior right lower lobe. Osseous demineralization, thoracolumbar scoliosis, and exaggeration of the usual thoracic kyphosis.  IMPRESSION: 1. Mild CHF, with stable marked cardiomegaly and mild diffuse interstitial pulmonary edema. 2. Moderately large right  pleural effusion, increased since 06/14/2014. Stable small left pleural effusion. (The patient has chronic bilateral effusions. ) 3. New consolidation in the anterior right lower lobe, passive atelectasis versus pneumonia. 4. Stable large hiatal hernia.   Electronically Signed   By: Evangeline Dakin M.D.   On: 08/03/2014 20:17    Scheduled Meds: . aspirin EC  81 mg Oral Daily  . clopidogrel  75 mg Oral Q breakfast  . enoxaparin (LOVENOX) injection  30 mg Subcutaneous Daily  . furosemide  40 mg Intravenous Q12H  . hydrALAZINE  10 mg Oral TID  . isosorbide mononitrate  30 mg Oral Daily  . metoprolol tartrate  25 mg Oral BID  . potassium chloride  10 mEq Oral Daily  . pravastatin  80 mg Oral QAC supper  . sodium chloride  3 mL Intravenous Q12H   Continuous Infusions:   Active Problems:   Hyperlipidemia   Essential hypertension   CAD-native   Chronic combined systolic and diastolic heart failure, NYHA class 2   COPD (chronic obstructive pulmonary disease)   CKD (chronic kidney disease), stage IV   Acute systolic congestive heart failure   Malnutrition of moderate degree   Acute systolic CHF (congestive heart failure), NYHA class 4    Time spent: 25 min    Kelvin Cellar  [facility] Pager [phone]. If 7PM-7AM, please contact night-coverage at [email or web address], password [facility] 08/05/2014, 12:01 PM  LOS: 2 days

## 2014-08-06 ENCOUNTER — Encounter (HOSPITAL_COMMUNITY)
Admission: EM | Disposition: A | Discharge status: Home Health | Payer: Self-pay | Source: Home / Self Care | Attending: Internal Medicine

## 2014-08-06 HISTORY — PX: CARDIAC CATHETERIZATION: SHX172

## 2014-08-06 LAB — CBC
HCT: 32.7 % — ABNORMAL LOW (ref 36.0–46.0)
Hemoglobin: 10.2 g/dL — ABNORMAL LOW (ref 12.0–15.0)
MCH: 25.6 pg — ABNORMAL LOW (ref 26.0–34.0)
MCHC: 31.2 g/dL (ref 30.0–36.0)
MCV: 82 fL (ref 78.0–100.0)
Platelets: 486 10*3/uL — ABNORMAL HIGH (ref 150–400)
RBC: 3.99 MIL/uL (ref 3.87–5.11)
RDW: 17.1 % — ABNORMAL HIGH (ref 11.5–15.5)
WBC: 6 10*3/uL (ref 4.0–10.5)

## 2014-08-06 LAB — POCT I-STAT 3, VENOUS BLOOD GAS (G3P V)
Acid-Base Excess: 8 mmol/L — ABNORMAL HIGH (ref 0.0–2.0)
Bicarbonate: 33.7 mEq/L — ABNORMAL HIGH (ref 20.0–24.0)
O2 Saturation: 69 %
PCO2 VEN: 49.4 mmHg (ref 45.0–50.0)
PH VEN: 7.442 — AB (ref 7.250–7.300)
TCO2: 35 mmol/L (ref 0–100)
pO2, Ven: 35 mmHg (ref 30.0–45.0)

## 2014-08-06 LAB — CREATININE, SERUM
Creatinine, Ser: 1.65 mg/dL — ABNORMAL HIGH (ref 0.44–1.00)
GFR calc Af Amer: 32 mL/min — ABNORMAL LOW (ref >60)
GFR, EST NON AFRICAN AMERICAN: 27 mL/min — AB (ref >60)

## 2014-08-06 LAB — BASIC METABOLIC PANEL
Anion gap: 7 (ref 5–15)
BUN: 21 mg/dL — AB (ref 6–20)
CO2: 32 mmol/L (ref 22–32)
CREATININE: 1.6 mg/dL — AB (ref 0.44–1.00)
Calcium: 8.9 mg/dL (ref 8.9–10.3)
Chloride: 101 mmol/L (ref 101–111)
GFR calc Af Amer: 33 mL/min — ABNORMAL LOW (ref >60)
GFR, EST NON AFRICAN AMERICAN: 28 mL/min — AB (ref >60)
Glucose, Bld: 81 mg/dL (ref 65–99)
Potassium: 3.6 mmol/L (ref 3.5–5.1)
SODIUM: 140 mmol/L (ref 135–145)

## 2014-08-06 LAB — POCT I-STAT 3, ART BLOOD GAS (G3+)
Acid-Base Excess: 7 mmol/L — ABNORMAL HIGH (ref 0.0–2.0)
BICARBONATE: 32.3 meq/L — AB (ref 20.0–24.0)
O2 SAT: 98 %
PO2 ART: 99 mmHg (ref 80.0–100.0)
TCO2: 34 mmol/L (ref 0–100)
pCO2 arterial: 46.3 mmHg — ABNORMAL HIGH (ref 35.0–45.0)
pH, Arterial: 7.451 — ABNORMAL HIGH (ref 7.350–7.450)

## 2014-08-06 SURGERY — LEFT HEART CATH AND CORONARY ANGIOGRAPHY
Anesthesia: LOCAL

## 2014-08-06 MED ORDER — CARVEDILOL 3.125 MG PO TABS
3.1250 mg | ORAL_TABLET | Freq: Two times a day (BID) | ORAL | Status: DC
  Administered 2014-08-07 – 2014-08-11 (×8): 3.125 mg via ORAL
  Filled 2014-08-06 (×12): qty 1

## 2014-08-06 MED ORDER — METOPROLOL TARTRATE 12.5 MG HALF TABLET
12.5000 mg | ORAL_TABLET | Freq: Two times a day (BID) | ORAL | Status: DC
  Filled 2014-08-06: qty 1

## 2014-08-06 MED ORDER — IOHEXOL 350 MG/ML SOLN
INTRAVENOUS | Status: DC | PRN
  Administered 2014-08-06: 140 mL via INTRACARDIAC

## 2014-08-06 MED ORDER — SODIUM CHLORIDE 0.9 % IJ SOLN
3.0000 mL | Freq: Two times a day (BID) | INTRAMUSCULAR | Status: DC
  Administered 2014-08-06: 3 mL via INTRAVENOUS

## 2014-08-06 MED ORDER — SODIUM CHLORIDE 0.9 % IJ SOLN: 3.0000 mL | INTRAMUSCULAR | Status: DC | PRN

## 2014-08-06 MED ORDER — SODIUM CHLORIDE 0.9 % IV SOLN: 250.0000 mL | INTRAVENOUS | Status: DC | PRN

## 2014-08-06 MED ORDER — SODIUM CHLORIDE 0.9 % IV SOLN
INTRAVENOUS | Status: DC
  Administered 2014-08-06: 1000 mL via INTRAVENOUS

## 2014-08-06 MED ORDER — MIDAZOLAM HCL 2 MG/2ML IJ SOLN
INTRAMUSCULAR | Status: DC | PRN
Start: 2014-08-06 — End: 2014-08-06
  Administered 2014-08-06: 1 mg via INTRAVENOUS

## 2014-08-06 MED ORDER — FENTANYL CITRATE (PF) 100 MCG/2ML IJ SOLN
INTRAMUSCULAR | Status: AC
  Filled 2014-08-06: qty 2

## 2014-08-06 MED ORDER — LIDOCAINE HCL (PF) 1 % IJ SOLN
INTRAMUSCULAR | Status: AC
  Filled 2014-08-06: qty 30

## 2014-08-06 MED ORDER — NITROGLYCERIN 1 MG/10 ML FOR IR/CATH LAB
INTRA_ARTERIAL | Status: AC
  Filled 2014-08-06: qty 10

## 2014-08-06 MED ORDER — LIDOCAINE HCL (PF) 1 % IJ SOLN: INTRAMUSCULAR | Status: DC | PRN

## 2014-08-06 MED ORDER — MIDAZOLAM HCL 2 MG/2ML IJ SOLN
INTRAMUSCULAR | Status: AC
  Filled 2014-08-06: qty 2

## 2014-08-06 MED ORDER — SODIUM CHLORIDE 0.9 % IJ SOLN
3.0000 mL | Freq: Two times a day (BID) | INTRAMUSCULAR | Status: DC
  Administered 2014-08-07 – 2014-08-12 (×10): 3 mL via INTRAVENOUS

## 2014-08-06 MED ORDER — SODIUM CHLORIDE 0.9 % IV SOLN: INTRAVENOUS | Status: DC

## 2014-08-06 MED ORDER — HEPARIN (PORCINE) IN NACL 2-0.9 UNIT/ML-% IJ SOLN
INTRAMUSCULAR | Status: AC
  Filled 2014-08-06: qty 1500

## 2014-08-06 MED ORDER — ISOSORBIDE MONONITRATE 15 MG HALF TABLET
15.0000 mg | ORAL_TABLET | Freq: Every day | ORAL | Status: DC
  Administered 2014-08-07 – 2014-08-11 (×5): 15 mg via ORAL
  Filled 2014-08-06 (×5): qty 1

## 2014-08-06 MED ORDER — LIDOCAINE HCL (PF) 1 % IJ SOLN
INTRAMUSCULAR | Status: DC | PRN
  Administered 2014-08-06: 17:00:00

## 2014-08-06 MED ORDER — SODIUM CHLORIDE 0.9 % IV SOLN: INTRAVENOUS | Status: AC

## 2014-08-06 MED ORDER — LIDOCAINE HCL (PF) 1 % IJ SOLN
INTRAMUSCULAR | Status: DC | PRN
  Administered 2014-08-06: 10 mL via INTRADERMAL

## 2014-08-06 MED ORDER — FUROSEMIDE 40 MG PO TABS
40.0000 mg | ORAL_TABLET | Freq: Two times a day (BID) | ORAL | Status: DC
  Administered 2014-08-06 – 2014-08-08 (×5): 40 mg via ORAL
  Filled 2014-08-06 (×8): qty 1

## 2014-08-06 SURGICAL SUPPLY — 15 items
CATH INFINITI 5 FR IM (CATHETERS) ×2 IMPLANT
CATH INFINITI 5FR ANG PIGTAIL (CATHETERS) ×2 IMPLANT
CATH INFINITI 5FR JL4 (CATHETERS) ×2 IMPLANT
CATH INFINITI JR4 5F (CATHETERS) ×2 IMPLANT
CATH SITESEER 5F MULTI A 2 (CATHETERS) ×2 IMPLANT
CATH SWAN GANZ 7F STRAIGHT (CATHETERS) ×2 IMPLANT
KIT HEART LEFT (KITS) ×2 IMPLANT
KIT HEART RIGHT NAMIC (KITS) ×2 IMPLANT
PACK CARDIAC CATHETERIZATION (CUSTOM PROCEDURE TRAY) ×2 IMPLANT
SHEATH PINNACLE 5F 10CM (SHEATH) ×2 IMPLANT
SHEATH PINNACLE 7F 10CM (SHEATH) ×2 IMPLANT
TRANSDUCER W/STOPCOCK (MISCELLANEOUS) ×4 IMPLANT
WIRE EMERALD 3MM-J .025X260CM (WIRE) ×2 IMPLANT
WIRE EMERALD 3MM-J .035X150CM (WIRE) ×2 IMPLANT
WIRE SAFE-T 1.5MM-J .035X260CM (WIRE) ×2 IMPLANT

## 2014-08-06 NOTE — Progress Notes (Signed)
[facility] PROGRESS NOTE  [name] YTK:[id number] DOB: July 22, 1927 DOA: 08/03/2014 PCP: Leonard Downing, MD  Assessment/Plan: 1. Acute on chronic combined systolic and diastolic congestive heart failure. -Patient presenting with clinical signs and symptoms consistent with acute CHF -Her last transthoracic echocardiogram was performed on 04/20/2014 that revealed an EF of 30-35% with diffuse hypokinesis as well as grade 2 diastolic dysfunction. -She was seen and evaluated by cardiology feeling that severe MR contributing to CHF. -She was started on Lasix 40 mg IV twice a day with good urine output. -Has a net negative fluid balance of 5.8 L, diuresing 2.3 L overnight -Her weight coming down from 58.2 kg to 55 kg this morning. -Will transition to Lasix 40 mg PO BID -Cardiology recommending changing metoprolol to coreg  2.  Coronary artery disease. -Patient had reported having chest pain overnight. Troponins negative. -She had a cardiac catheterizations done on 06/04/2014 and 05/30/2014 showing 85% in-stent restenosis in the proximal RCA, proximal circumflex to mid circumflex 70% stenosis, first marginal lesion 80% stenosis, first on the lesion 90% stenosis in proximal LAD to mid LAD lesion 50% stenosis. She had stenting with drug-eluting stent on 06/04/2014, with plans for staged PCI given chronic kidney disease.  -Cardiology felt that circumflex and first marginal stenosis could be causing ischemic MR -Plan for cardiac catheterization early next week  3.  Pleural effusions.  -Likely secondary to congestive heart failure -Previous imaging studies of chest have shown chronic pleural effusions -Chest x-ray on admission showing moderately large right pleural effusion that has increased since 06/14/2014 -Repeat CXR showing improvement as clinically she seems to be doing much better.   4.  Mitral valve regurgitation -Transthoracic echocardiogram from 04/20/2014 showing severe  regurgitation -Cardiology consulted, felt would benefit from reperfusion therapy -2-D echo performed on 08/04/2014 showing ejection fraction of 30% with diffuse hypokinesis. There is severe mitral regurgitation  -Plan for TEE  5. Hypertension -She had low normal BP's overnight and this am, will decrease Imdur dose to 15 mg PO daily. Switching metoprolol to coreg 3.125 mg PO BID  6.  Dyslipidemia -Continue pravastatin 80 mg by mouth daily  Code Status: Full code Family Communication: Spoke with her daughter was present at bedside Disposition Plan: Plan for heart cath and TEE   Consultants:  Radiology  Procedures:  Transthoracic echocardiogram impression: Left ventricle: The cavity size was moderately dilated. Wall thickness was normal. The estimated ejection fraction was 30%. Diffuse hypokinesis. Findings consistent with left ventricular diastolic dysfunction. Doppler parameters are consistent with high ventricular filling pressure. Mildly thickened leaflets . There was severe regurgitation.  HPI/Subjective: Patient is a pleasant 81 year old female with an established history of coronary artery disease, chronic combined systolic and diastolic congestive heart failure, hypertension, dyslipidemia, admitted to the medicine service on 08/03/2014 when she presented with complaints of chest pain associate with shortness of breath. Initial workup included a two-view chest x-ray that revealed findings consistent with CHF with cardiomegaly and diffuse interstitial pulmonary edema. Also noted was a moderately large right pleural effusion, that had increased in size compared to exam on 06/14/2014. Per radiology patient having chronic bilateral effusions. Troponins were stable at 0.03 and 0.04. Symptoms felt to be due to acute on chronic systolic congestive heart failure as she was started on Lasix 40 mg IV twice a day. She was evaluated by cardiology who recommended optimizing volume  status prior to undergoing heart cath next week.   Objective: Filed Vitals:   08/06/14 0643  BP: 103/57  Pulse:  72  Temp: 97.9 F (36.6 C)  Resp: 18    Intake/Output Summary (Last 24 hours) at 08/06/14 1306 Last data filed at 08/06/14 0836  Gross per 24 hour  Intake    700 ml  Output   1950 ml  Net  -1250 ml   Filed Weights   08/04/14 0456 08/05/14 0513 08/06/14 0643  Weight: 58.2 kg (128 lb 4.9 oz) 56.337 kg (124 lb 3.2 oz) 55.702 kg (122 lb 12.8 oz)    Exam:   General:  Patient states feeling little better, has ongoing shortness of breath  Cardiovascular: Regular rate and rhythm normal S1-S2 no murmurs rubs or gallops, improvement to lower extremity edema  Respiratory: Having crackles to bilateral bases, positive rales, on supplemental oxygen  Abdomen: Soft nontender nondistended  Musculoskeletal: resolution of edema  Data Reviewed: Basic Metabolic Panel:  Recent Labs Lab 08/03/14 2021 08/04/14 0543 08/05/14 0331 08/06/14 0335  NA 135 138 137 140  K 4.6 3.9 3.8 3.6  CL 97* 99* 98* 101  CO2 24 29 30  32  GLUCOSE 113* 87 81 81  BUN 30* 28* 28* 21*  CREATININE 1.72* 1.69* 1.69* 1.60*  CALCIUM 9.3 8.9 8.4* 8.9   Liver Function Tests: No results for input(s): AST, ALT, ALKPHOS, BILITOT, PROT, ALBUMIN in the last 168 hours. No results for input(s): LIPASE, AMYLASE in the last 168 hours. No results for input(s): AMMONIA in the last 168 hours. CBC:  Recent Labs Lab 08/03/14 2021 08/04/14 1120  WBC 7.2 5.7  HGB 9.4* 9.3*  HCT 30.8* 30.3*  MCV 80.6 81.5  PLT 512* 380   Cardiac Enzymes:  Recent Labs Lab 08/04/14 0008 08/04/14 0543 08/04/14 1120  TROPONINI 0.03 0.04* 0.04*   BNP (last 3 results)  Recent Labs  05/22/14 0409 05/29/14 1000 08/03/14 2021  BNP 801.8* 851.1* 2801.4*    ProBNP (last 3 results) No results for input(s): PROBNP in the last 8760 hours.  CBG: No results for input(s): GLUCAP in the last 168 hours.  No results  found for this or any previous visit (from the past 240 hour(s)).   Studies: Dg Chest 2 View  08/05/2014   CLINICAL DATA:  Pleural effusion, shortness of breath  EXAM: CHEST  2 VIEW  COMPARISON:  08/03/2014  FINDINGS: Moderate enlargement of the cardiac silhouette noted. Hyperinflation and coarsened interstitial opacities suggest emphysema with possible superimposed interstitial edema. Bilateral hilar prominence with tapering may suggest pulmonary arterial hypertension. Small pleural effusions. Bones are subjectively osteopenic. Patchy bibasilar opacities are most likely compressive atelectasis. Atheromatous aortic calcification noted.  IMPRESSION: Probable emphysematous change with cardiomegaly and possible superimposed interstitial edema.  Small pleural effusions.   Electronically Signed   By: Conchita Paris M.D.   On: 08/05/2014 09:55    Scheduled Meds: . aspirin EC  81 mg Oral Daily  . carvedilol  3.125 mg Oral BID WC  . clopidogrel  75 mg Oral Q breakfast  . enoxaparin (LOVENOX) injection  30 mg Subcutaneous Daily  . furosemide  40 mg Oral BID  . hydrALAZINE  10 mg Oral TID  . [START ON 08/07/2014] isosorbide mononitrate  15 mg Oral Daily  . potassium chloride  10 mEq Oral Daily  . pravastatin  80 mg Oral QAC supper  . sodium chloride  3 mL Intravenous Q12H  . sodium chloride  3 mL Intravenous Q12H   Continuous Infusions: . sodium chloride      Active Problems:   Hyperlipidemia   Essential hypertension  CAD-native   Chronic combined systolic and diastolic heart failure, NYHA class 2   COPD (chronic obstructive pulmonary disease)   CKD (chronic kidney disease), stage IV   Acute systolic congestive heart failure   Malnutrition of moderate degree   Acute systolic CHF (congestive heart failure), NYHA class 4    Time spent: 25 min    Kelvin Cellar  [facility] Pager [phone]. If 7PM-7AM, please contact night-coverage at [email or web address], password [facility] 08/06/2014,  1:06 PM  LOS: 3 days

## 2014-08-06 NOTE — Progress Notes (Signed)
Patient Profile: 81 y.o. female with a history of CAD s/p CABG (09/2008, LIMA to LAD and free RIMA Y graft to OM off of LIMA with ortic root replacement) ischemic cardiomyopathy (30% to 35%, 04/979), chronic systolic CHF, HTN, HLD, tobacco abuse, COPD on 2L oxygen, CKD, PAD, and severe mitral regurgitation who presents with chest pain and shortness of breath.   Subjective: Breathing improved. No chest pain.   Objective: Vital signs in last 24 hours: Temp:  [97.9 F (36.6 C)-98.5 F (36.9 C)] 97.9 F (36.6 C) (07/25 0643) Pulse Rate:  [72-86] 72 (07/25 0643) Resp:  [17-18] 18 (07/25 0643) BP: (101-129)/(42-60) 103/57 mmHg (07/25 0643) SpO2:  [94 %-98 %] 98 % (07/25 0643) Weight:  [122 lb 12.8 oz (55.702 kg)] 122 lb 12.8 oz (55.702 kg) (07/25 0643) Last BM Date: 08/03/14  Intake/Output from previous day: 07/24 0701 - 07/25 0700 In: 940 [P.O.:940] Out: 2350 [Urine:2350] Intake/Output this shift: Total I/O In: 0  Out: 400 [Urine:400]  Medications Current Facility-Administered Medications  Medication Dose Route Frequency Provider Last Rate Last Dose  . 0.9 %  sodium chloride infusion  250 mL Intravenous PRN Sid Falcon, MD      . acetaminophen (TYLENOL) tablet 650 mg  650 mg Oral Daily PRN Sid Falcon, MD      . albuterol (PROVENTIL) (2.5 MG/3ML) 0.083% nebulizer solution 2.5 mg  2.5 mg Inhalation Q4H PRN Sid Falcon, MD      . aspirin EC tablet 81 mg  81 mg Oral Daily Sid Falcon, MD   Stopped at 08/06/14 1000  . clopidogrel (PLAVIX) tablet 75 mg  75 mg Oral Q breakfast Sid Falcon, MD   Stopped at 08/06/14 0800  . enoxaparin (LOVENOX) injection 30 mg  30 mg Subcutaneous Daily Sid Falcon, MD   Stopped at 08/06/14 1000  . furosemide (LASIX) tablet 40 mg  40 mg Oral BID Kelvin Cellar, MD      . hydrALAZINE (APRESOLINE) tablet 10 mg  10 mg Oral TID Sid Falcon, MD   Stopped at 08/06/14 1000  . ipratropium-albuterol (DUONEB) 0.5-2.5 (3) MG/3ML nebulizer  solution 3 mL  3 mL Nebulization Q6H PRN Sid Falcon, MD      . Derrill Memo ON 08/07/2014] isosorbide mononitrate (IMDUR) 24 hr tablet 15 mg  15 mg Oral Daily Kelvin Cellar, MD      . metoprolol tartrate (LOPRESSOR) tablet 12.5 mg  12.5 mg Oral BID Kelvin Cellar, MD      . nitroGLYCERIN (NITROSTAT) SL tablet 0.4 mg  0.4 mg Sublingual Q5 min PRN Sid Falcon, MD      . ondansetron [facility]) injection 4 mg  4 mg Intravenous Q6H PRN Sid Falcon, MD      . potassium chloride (K-DUR,KLOR-CON) CR tablet 10 mEq  10 mEq Oral Daily Sid Falcon, MD   10 mEq at 08/05/14 1150  . pravastatin (PRAVACHOL) tablet 80 mg  80 mg Oral QAC supper Sid Falcon, MD   80 mg at 08/05/14 1942  . sodium chloride 0.9 % injection 3 mL  3 mL Intravenous Q12H Sid Falcon, MD   3 mL at 08/05/14 2146  . sodium chloride 0.9 % injection 3 mL  3 mL Intravenous PRN Sid Falcon, MD        PE: General appearance: alert, cooperative and no distress Neck: no carotid bruit and no JVD Lungs: decreased BS at the bases Heart: regular rate and  rhythm and 3/6 holosystolic murmur Extremities: no LEE Pulses: 2+ and symmetric Skin: warm and dry Neurologic: Grossly normal  Lab Results:   Recent Labs  08/03/14 2021 08/04/14 1120  WBC 7.2 5.7  HGB 9.4* 9.3*  HCT 30.8* 30.3*  PLT 512* 380   BMET  Recent Labs  08/04/14 0543 08/05/14 0331 08/06/14 0335  NA 138 137 140  K 3.9 3.8 3.6  CL 99* 98* 101  CO2 29 30 32  GLUCOSE 87 81 81  BUN 28* 28* 21*  CREATININE 1.69* 1.69* 1.60*  CALCIUM 8.9 8.4* 8.9   PT/INR No results for input(s): LABPROT, INR in the last 72 hours. Cholesterol  Recent Labs  08/04/14 0543  CHOL 153   Cardiac Enzymes Invalid input(s): TROPONIN,  CKMB  Studies/Results: @RISRSLT2 @   Assessment/Plan    Active Problems:   Hyperlipidemia   Essential hypertension   CAD-native   Chronic combined systolic and diastolic heart failure, NYHA class 2   COPD (chronic obstructive  pulmonary disease)   CKD (chronic kidney disease), stage IV   Acute systolic congestive heart failure   Malnutrition of moderate degree   Acute systolic CHF (congestive heart failure), NYHA class 64  81 y/o female with a history of CAD s/p CABG (09/2008, LIMA to LAD and free RIMA Y graft to OM off of LIMA with ortic root replacement) ischemic cardiomyopathy (30% to 35%, 01/256), chronic systolic CHF, HTN, HLD, tobacco abuse, COPD on 2L oxygen, CKD, PAD, and severe mitral regurgitation, readmitted for a/c CHF and chest pain. She has had multiple readmissions for acute HF secondary to her severe mitral valve disease over the last several months.   1. Acute on Chronic Systolic CHF: EF 52% on echo and unchanged compared to prior. She has diuresed well. I/Os net negative 5.8L. Dyspnea resolved. Euvolemic on physical exam. Continue PO Lasix, and BB therapy. Consider switching from Lopressor to Coreg given low EF. Continue Imdur and hydralazine in place of ACE/ARB given CKD. Continue low sodium diet, daily weights and strict I/Os.   2. CAD: h/o CABG in 2010. Recent CP resolved after diuresis. Only slight bump in troponin to 0.04 which likely 2/2 demand ischemia from her acute CHF. However plan for LHC today given her severe MR.   3. Severe Mitral Valve Regurgitation: ? If ischemically mediated and if condition can improve with revascularization. She did have an abnormal NST in 2013 but decision was made to not undergo [facility] at that time because she was asymptomatic. However with worsened MV disease, will plan for LHC today to redefine coronary anatomy. Also ? If a good candidate for mitral clip. Will try to arrange TEE tomorrow.   4. CKD: Scr today is 1.60. Monitor closely post cath.     LOS: 3 days    [name] 08/06/2014 11:31 AM  Patient seen and examined. I agree with the assessment and plan as detailed above. See also my additional thoughts below.   I reviewed all of the  information. I spoken with the patient and her family. I spoken with Dr. Pernell Dupre. The patient is having recurrent admissions for CHF. This appears to be related to her mitral regurgitation. The family and the patient wanted to proceed with more aggressive workup. We will proceed with cardiac catheterization to read document her coronary anatomy. She will then have a transesophageal echo tomorrow to further assess her mitral anatomy. With all of this information we will be sure that Dr. Angelena Form is involved  with the final decisions as to whether or not she could be a candidate for a procedure for her mitral valve.  Candice Argyle, MD, [facility] 08/06/2014 12:20 PM

## 2014-08-06 NOTE — Progress Notes (Signed)
Utilization Review Completed.[name] T7/25/2016  

## 2014-08-06 NOTE — Plan of Care (Signed)
Problem: Phase I Progression Outcomes Goal: Vascular site scale level 0 - I Vascular Site Scale Level 0: No bruising/bleeding/hematoma Level I (Mild): Bruising/Ecchymosis, minimal bleeding/ooozing, palpable hematoma < 3 cm Level II (Moderate): Bleeding not affecting hemodynamic parameters, pseudoaneurysm, palpable hematoma > 3 cm Level III (Severe) Bleeding which affects hemodynamic parameters or retroperitoneal hemorrhage  Outcome: Completed/Met Date Met:  08/06/14 Level 0

## 2014-08-06 NOTE — Care Management Important Message (Signed)
Important Message  Patient Details  Name: [name] MRN: [id number] Date of Birth: [birth year]   Medicare Important Message Given:  Yes-second notification given    Pricilla Handler 08/06/2014, 2:23 PM

## 2014-08-06 NOTE — Progress Notes (Signed)
Pt  Up in recliner.  Family at at the bedside.   Introduced self to pt and family member as Teacher, music 11am-7pm.  Call bell at reach.  Instructed to call for assistance.  Verbalized understanding.  Will continue to monitor.  Karie Kirks, Therapist, sports.

## 2014-08-06 NOTE — Progress Notes (Signed)
Pt arrived to floor at 1850 from the Cath lab.  Rt groin dsg. D&I.  Denies pain/discomfort.  Instructed to continue bed rest until tonight around 10pm.  Will continue to monitor.  Karie Kirks, Therapist, sports.

## 2014-08-06 NOTE — Progress Notes (Signed)
Site area: RFA/RFV Site Prior to Removal:  Level 0 Pressure Applied For:68min Manual:yes    Patient Status During Pull:stable   Post Pull Site:  Level 0 Post Pull Instructions Given: yes  Post Pull Pulses Present: palpable Dressing Applied: clear  Bedrest begins @ 1825 Comments:  RFA/RFV

## 2014-08-07 ENCOUNTER — Encounter (HOSPITAL_COMMUNITY)
Admission: EM | Disposition: A | Discharge status: Home Health | Payer: Self-pay | Source: Home / Self Care | Attending: Internal Medicine

## 2014-08-07 ENCOUNTER — Encounter (HOSPITAL_COMMUNITY): Payer: Self-pay | Admitting: Interventional Cardiology

## 2014-08-07 ENCOUNTER — Ambulatory Visit (HOSPITAL_COMMUNITY): Payer: Medicare Other

## 2014-08-07 DIAGNOSIS — I4891 Unspecified atrial fibrillation: Secondary | ICD-10-CM | POA: Insufficient documentation

## 2014-08-07 LAB — CBC
HCT: 33.3 % — ABNORMAL LOW (ref 36.0–46.0)
HEMOGLOBIN: 10.3 g/dL — AB (ref 12.0–15.0)
MCH: 25.4 pg — ABNORMAL LOW (ref 26.0–34.0)
MCHC: 30.9 g/dL (ref 30.0–36.0)
MCV: 82 fL (ref 78.0–100.0)
PLATELETS: 503 10*3/uL — AB (ref 150–400)
RBC: 4.06 MIL/uL (ref 3.87–5.11)
RDW: 17.3 % — AB (ref 11.5–15.5)
WBC: 6 10*3/uL (ref 4.0–10.5)

## 2014-08-07 LAB — BASIC METABOLIC PANEL
Anion gap: 9 (ref 5–15)
BUN: 17 mg/dL (ref 6–20)
CO2: 31 mmol/L (ref 22–32)
Calcium: 9 mg/dL (ref 8.9–10.3)
Chloride: 95 mmol/L — ABNORMAL LOW (ref 101–111)
Creatinine, Ser: 1.65 mg/dL — ABNORMAL HIGH (ref 0.44–1.00)
GFR calc Af Amer: 32 mL/min — ABNORMAL LOW (ref >60)
GFR calc non Af Amer: 27 mL/min — ABNORMAL LOW (ref >60)
GLUCOSE: 82 mg/dL (ref 65–99)
Potassium: 3.7 mmol/L (ref 3.5–5.1)
Sodium: 135 mmol/L (ref 135–145)

## 2014-08-07 LAB — HEPARIN LEVEL (UNFRACTIONATED): HEPARIN UNFRACTIONATED: 0.31 [IU]/mL (ref 0.30–0.70)

## 2014-08-07 SURGERY — CANCELLED PROCEDURE

## 2014-08-07 MED ORDER — HEPARIN (PORCINE) IN NACL 100-0.45 UNIT/ML-% IJ SOLN
900.0000 [IU]/h | INTRAMUSCULAR | Status: DC
  Administered 2014-08-07: 850 [IU]/h via INTRAVENOUS
  Administered 2014-08-08: 900 [IU]/h via INTRAVENOUS
  Filled 2014-08-07 (×3): qty 250

## 2014-08-07 MED ORDER — SODIUM CHLORIDE 0.9 % IV SOLN
INTRAVENOUS | Status: DC
  Administered 2014-08-07: 20 mL via INTRAVENOUS

## 2014-08-07 MED ORDER — AMIODARONE HCL IN DEXTROSE 360-4.14 MG/200ML-% IV SOLN
30.0000 mg/h | INTRAVENOUS | Status: DC
  Administered 2014-08-07 – 2014-08-08 (×4): 30 mg/h via INTRAVENOUS
  Filled 2014-08-07 (×7): qty 200

## 2014-08-07 MED ORDER — HEPARIN BOLUS VIA INFUSION
2500.0000 [IU] | Freq: Once | INTRAVENOUS | Status: AC
  Administered 2014-08-07: 2500 [IU] via INTRAVENOUS
  Filled 2014-08-07: qty 2500

## 2014-08-07 MED ORDER — POLYETHYLENE GLYCOL 3350 17 G PO PACK
17.0000 g | PACK | Freq: Every day | ORAL | Status: DC
  Administered 2014-08-08: 17 g via ORAL
  Filled 2014-08-07 (×2): qty 1

## 2014-08-07 MED ORDER — DOCUSATE SODIUM 100 MG PO CAPS
100.0000 mg | ORAL_CAPSULE | Freq: Two times a day (BID) | ORAL | Status: DC
  Administered 2014-08-08 – 2014-08-14 (×9): 100 mg via ORAL
  Filled 2014-08-07 (×18): qty 1

## 2014-08-07 MED ORDER — AMIODARONE HCL IN DEXTROSE 360-4.14 MG/200ML-% IV SOLN
60.0000 mg/h | INTRAVENOUS | Status: DC
  Administered 2014-08-07 (×2): 60 mg/h via INTRAVENOUS
  Filled 2014-08-07 (×2): qty 200

## 2014-08-07 MED ORDER — AMIODARONE LOAD VIA INFUSION
150.0000 mg | Freq: Once | INTRAVENOUS | Status: AC
  Administered 2014-08-07: 150 mg via INTRAVENOUS
  Filled 2014-08-07: qty 83.34

## 2014-08-07 NOTE — Progress Notes (Signed)
On rounding at about 1920 noted rectal prolapse has retracted.  Very little blood noted around rectum.  Pt made aware that is been resolved.  On coming nurse made aware also.  Karie Kirks, Therapist, sports.

## 2014-08-07 NOTE — Progress Notes (Signed)
Cath yesterday reviewed. Don't know if  she would be a candidate for Mitraclip. Her LV is worse than the past. But I feel we should do TEE to complete all data  to make best decisions.  Daryel November, MD

## 2014-08-07 NOTE — Progress Notes (Addendum)
Pt c/o of constipation and requested prune juice which pt had and went to bathroom to have a BM.  While sitting on the commode pt yelled out for nurse to come.  Pt. Stated I had a BM and I think  some thing is wrong, can feel something hanging.  Noted pt has had a rectal prolapse.  Informed Dr. Coralyn Pear, and he instructed that he will call surgery.   Assisted pt back to bed.  Will continue to monitor.  Javonna Balli,RN.

## 2014-08-07 NOTE — Progress Notes (Signed)
ANTICOAGULATION CONSULT NOTE - Follow-Up Consult  Pharmacy Consult for heparin Indication: atrial fibrillation  No Known Allergies  Patient Measurements: Height: 5\' 6"  (167.6 cm) Weight: 122 lb 1.6 oz (55.384 kg) (a scale) IBW/kg (Calculated) : 59.3  Vital Signs: Temp: 97.8 F (36.6 C) (07/26 1339) Temp Source: Oral (07/26 1339) BP: 105/51 mmHg (07/26 1721) Pulse Rate: 84 (07/26 1721)  Labs:  Recent Labs  08/06/14 0335 08/06/14 1854 08/07/14 0345 08/07/14 1934  HGB  --  10.2* 10.3*  --   HCT  --  32.7* 33.3*  --   PLT  --  486* 503*  --   HEPARINUNFRC  --   --   --  0.31  CREATININE 1.60* 1.65* 1.65*  --     Estimated Creatinine Clearance: 21.8 mL/min (by C-G formula based on Cr of 1.65).   Medical History: Past Medical History  Diagnosis Date  . CAD (coronary artery disease)     a. 07/2008 Inf STEMI->RCA BMS;  b. 09/2008 CABGx2: LIMA->LAD, RIMA->OM as Y from LIMA;  b. 06/2011 MV EF 31%, bsaal to dist ant infarct, inf/inflat infarct with mild to mod peri-infarct ischemia [facility]).  . Ischemic cardiomyopathy     a. 06/2011 Echo: EF 40%, inf/inflat AK and lat HK, aortic sclerosis w/ mild AI, Mild to mod MR (prev mod-sev), Gr 1 DD, mild TR, RVSP 25mmHg  . Systolic CHF, chronic     a. 06/2011 EF 40%  . Hypertension   . Hyperlipidemia   . Hiatal hernia     large  . Tobacco abuse   . Cerebrovascular disease   . COPD (chronic obstructive pulmonary disease)     presumed   . Arterial disease     peripheral arterial disease with claudication - bilateral SFA disease with ABI's 0.4-0.5 bilaterally   . Mitral regurgitation     a. prev Mod-Sev, read as mild to mod by echo 06/2011 [facility]).  . CKD (chronic kidney disease), stage IV     born with 1 kidney  . Thyroid nodule 10/2012    left dominant nodule cytology:non-neoplastic goiter.   . Cataracts, both eyes     not surgical  . Personal history of colonic polyps - adenomas 05/09/2013  . Anemia   .  Systolic HF (heart failure)   . Hypertensive renal disease   . Hypertensive heart disease with congestive heart failure   . Aorta aneurysm   . LVH (left ventricular hypertrophy)   . Arteriosclerotic vascular disease   . Bronchiectasis   . Atherosclerosis of abdominal aorta   . Heart murmur   . Shortness of breath dyspnea   . MI (myocardial infarction) 2010   Assessment: 81 yo F presents on 7/22 with SOB and chest pressure. PMH includes CAD, systolic CHF, HTN, CKD, anemia. On 7/26 she developed Afib with RVR with the HR in the 150s. She is starting on an amio gtt and pharmacy consulted to start heparin. Hgb 10.3, plts 503. No s/s of bleed. Pt on a baby aspirin and plavix as well.  PM update: Pt has converted to NSR on Amio.  Heparin level is at low end of therapeutic goal on 850 units/hr.  Will increase slightly.  Goal of Therapy:  Heparin level 0.3-0.7 units/ml Monitor platelets by anticoagulation protocol: Yes   Plan:  Increase heparin to 900 units/hr. Next heparin level with AM labs. Monitor daily HL, CBC, s/s of bleed  [employer], Pharm.D., BCPS Clinical Pharmacist Pager [phone] 08/07/2014 8:48  PM   

## 2014-08-07 NOTE — Progress Notes (Signed)
Patient seen in the endo suite for TEE.  However, this morning she has developed atrial fibrillation with RVR, HR in 150s (new-onset).    Will cancel plans for TEE until she is rate-controlled.  Given low EF, I will start her on amiodarone gtt.  I will also put her on heparin gtt.  Conversion to NSR should be safe in this situation given new atrial fibrillation over the last few hours.   [name] 08/07/2014 9:58 AM

## 2014-08-07 NOTE — Progress Notes (Signed)
ANTICOAGULATION CONSULT NOTE - Initial Consult  Pharmacy Consult for heparin Indication: atrial fibrillation  No Known Allergies  Patient Measurements: Height: 5\' 6"  (167.6 cm) Weight: 122 lb 1.6 oz (55.384 kg) (a scale) IBW/kg (Calculated) : 59.3  Vital Signs: Temp: 97.9 F (36.6 C) (07/26 0607) Temp Source: Oral (07/26 0607) BP: 131/113 mmHg (07/26 0924) Pulse Rate: 157 (07/26 0924)  Labs:  Recent Labs  08/04/14 1120  08/06/14 0335 08/06/14 1854 08/07/14 0345  HGB 9.3*  --   --  10.2* 10.3*  HCT 30.3*  --   --  32.7* 33.3*  PLT 380  --   --  486* 503*  CREATININE  --   < > 1.60* 1.65* 1.65*  TROPONINI 0.04*  --   --   --   --   < > = values in this interval not displayed.  Estimated Creatinine Clearance: 21.8 mL/min (by C-G formula based on Cr of 1.65).   Medical History: Past Medical History  Diagnosis Date  . CAD (coronary artery disease)     a. 07/2008 Inf STEMI->RCA BMS;  b. 09/2008 CABGx2: LIMA->LAD, RIMA->OM as Y from LIMA;  b. 06/2011 MV EF 31%, bsaal to dist ant infarct, inf/inflat infarct with mild to mod peri-infarct ischemia [facility]).  . Ischemic cardiomyopathy     a. 06/2011 Echo: EF 40%, inf/inflat AK and lat HK, aortic sclerosis w/ mild AI, Mild to mod MR (prev mod-sev), Gr 1 DD, mild TR, RVSP 39mmHg  . Systolic CHF, chronic     a. 06/2011 EF 40%  . Hypertension   . Hyperlipidemia   . Hiatal hernia     large  . Tobacco abuse   . Cerebrovascular disease   . COPD (chronic obstructive pulmonary disease)     presumed   . Arterial disease     peripheral arterial disease with claudication - bilateral SFA disease with ABI's 0.4-0.5 bilaterally   . Mitral regurgitation     a. prev Mod-Sev, read as mild to mod by echo 06/2011 [facility]).  . CKD (chronic kidney disease), stage IV     born with 1 kidney  . Thyroid nodule 10/2012    left dominant nodule cytology:non-neoplastic goiter.   . Cataracts, both eyes     not surgical  .  Personal history of colonic polyps - adenomas 05/09/2013  . Anemia   . Systolic HF (heart failure)   . Hypertensive renal disease   . Hypertensive heart disease with congestive heart failure   . Aorta aneurysm   . LVH (left ventricular hypertrophy)   . Arteriosclerotic vascular disease   . Bronchiectasis   . Atherosclerosis of abdominal aorta   . Heart murmur   . Shortness of breath dyspnea   . MI (myocardial infarction) 2010   Assessment: 81 yo F presents on 7/22 with SOB and chest pressure. PMH includes CAD, systolic CHF, HTN, CKD, anemia. On 7/26 she has developed Afib with RVR with the HR in the 150s. She is starting on an amio gtt and pharmacy consulted to start heparin. Hgb 10.3, plts 503. No s/s of bleed. Pt on a baby aspirin and plavix as well.  Goal of Therapy:  Heparin level 0.3-0.7 units/ml Monitor platelets by anticoagulation protocol: Yes   Plan:  Give heparin 2,500 units BOLUS Start heparin gtt at 850 units/hr Check 8 hr HL at 1830 tonight Monitor daily HL, CBC, s/s of bleed  Keyston Ardolino J 08/07/2014,10:04 AM

## 2014-08-07 NOTE — Progress Notes (Signed)
Notified [place], [place] that pt is for TEE today at 10 am & , to be picked up at 9 am,t but no order.  Instructed that he will put orders in.  Karie Kirks, Therapist, sports.

## 2014-08-07 NOTE — Progress Notes (Signed)
Patient converted back to NSR, heart rate 60s-70s confirmed by EKG.  Almyra [place], [place] made aware.  Will continue to monitor.

## 2014-08-07 NOTE — Progress Notes (Signed)
Patient Name: [name] Date of Encounter: 08/07/2014  Primary Cardiologist: Dr. Angelena Form   Active Problems:   Hyperlipidemia   Essential hypertension   CAD-native   Chronic combined systolic and diastolic heart failure, NYHA class 2   COPD (chronic obstructive pulmonary disease)   CKD (chronic kidney disease), stage IV   Acute systolic congestive heart failure   Malnutrition of moderate degree   Acute systolic CHF (congestive heart failure), NYHA class 4   Severe mitral regurgitation    SUBJECTIVE  Denies any CP or SOB. Feeling good after cath yesterday  CURRENT MEDS . aspirin EC  81 mg Oral Daily  . carvedilol  3.125 mg Oral BID WC  . clopidogrel  75 mg Oral Q breakfast  . enoxaparin (LOVENOX) injection  30 mg Subcutaneous Daily  . furosemide  40 mg Oral BID  . hydrALAZINE  10 mg Oral TID  . isosorbide mononitrate  15 mg Oral Daily  . potassium chloride  10 mEq Oral Daily  . pravastatin  80 mg Oral QAC supper  . sodium chloride  3 mL Intravenous Q12H  . sodium chloride  3 mL Intravenous Q12H    OBJECTIVE  Filed Vitals:   08/06/14 2115 08/06/14 2145 08/07/14 0237 08/07/14 0607  BP: 100/42 105/46 115/54 116/50  Pulse: 80 79 86 77  Temp:   98 F (36.7 C) 97.9 F (36.6 C)  TempSrc:   Oral Oral  Resp:   17 18  Height:      Weight:   122 lb 1.6 oz (55.384 kg)   SpO2:   92% 94%    Intake/Output Summary (Last 24 hours) at 08/07/14 0837 Last data filed at 08/07/14 0809  Gross per 24 hour  Intake 364.17 ml  Output   1600 ml  Net -1235.83 ml   Filed Weights   08/05/14 0513 08/06/14 0643 08/07/14 0237  Weight: 124 lb 3.2 oz (56.337 kg) 122 lb 12.8 oz (55.702 kg) 122 lb 1.6 oz (55.384 kg)    PHYSICAL EXAM  General: Pleasant, NAD. Neuro: Alert and oriented X 3. Moves all extremities spontaneously. Psych: Normal affect. HEENT:  Normal  Neck: Supple without bruits or JVD. Lungs:  Resp regular and unlabored, CTA. Heart: RRR no s3, s4, or  murmurs. Abdomen: Soft, non-tender, non-distended, BS + x 4.  R femoral cath site stable, mildly sore, however no obvious bleeding, dressing in place. Extremities: No clubbing, cyanosis or edema. DP/PT/Radials 2+ and equal bilaterally.  Accessory Clinical Findings  CBC  Recent Labs  08/06/14 1854 08/07/14 0345  WBC 6.0 6.0  HGB 10.2* 10.3*  HCT 32.7* 33.3*  MCV 82.0 82.0  PLT 486* 382*   Basic Metabolic Panel  Recent Labs  08/06/14 0335 08/06/14 1854 08/07/14 0345  NA 140  --  135  K 3.6  --  3.7  CL 101  --  95*  CO2 32  --  31  GLUCOSE 81  --  82  BUN 21*  --  17  CREATININE 1.60* 1.65* 1.65*  CALCIUM 8.9  --  9.0   Cardiac Enzymes  Recent Labs  08/04/14 1120  TROPONINI 0.04*    TELE NSR with occasional 5 beats run of NSVT     ECG  No new EKG  Echocardiogram 08/04/2014  LV EF: 30%  ------------------------------------------------------------------- Indications:   CHF - 428.0.  ------------------------------------------------------------------- History:  PMH:  Coronary artery disease. Congestive heart failure. Aortic valve disease. Mitral valve disease. Chronic obstructive pulmonary disease. PMH:  Myocardial infarction. Risk factors: Former tobacco use. Hypertension.  ------------------------------------------------------------------- Study Conclusions  - Left ventricle: The cavity size was moderately dilated. Wall thickness was normal. The estimated ejection fraction was 30%. Diffuse hypokinesis. Findings consistent with left ventricular diastolic dysfunction. Doppler parameters are consistent with high ventricular filling pressure. - Aortic valve: Leaflets thickened and calcified, Mild AS. Mild AI. - Mitral valve: Mildly thickened leaflets . There was severe regurgitation. - Left atrium: The atrium was severely dilated. - Right ventricle: The cavity size was mildly dilated. Systolic function was mildly reduced. -  Right atrium: The atrium was mildly dilated. - Tricuspid valve: There was mild-moderate regurgitation. - Pulmonary arteries: PA peak pressure: 35 mm Hg (S). - Pericardium, extracardiac: There was a left pleural effusion.    Radiology/Studies  Dg Chest 2 View  08/05/2014   CLINICAL DATA:  Pleural effusion, shortness of breath  EXAM: CHEST  2 VIEW  COMPARISON:  08/03/2014  FINDINGS: Moderate enlargement of the cardiac silhouette noted. Hyperinflation and coarsened interstitial opacities suggest emphysema with possible superimposed interstitial edema. Bilateral hilar prominence with tapering may suggest pulmonary arterial hypertension. Small pleural effusions. Bones are subjectively osteopenic. Patchy bibasilar opacities are most likely compressive atelectasis. Atheromatous aortic calcification noted.  IMPRESSION: Probable emphysematous change with cardiomegaly and possible superimposed interstitial edema.  Small pleural effusions.   Electronically Signed   By: Conchita Paris M.D.   On: 08/05/2014 09:55   Dg Chest 2 View  08/03/2014   CLINICAL DATA:  Former smoker with current history of oxygen dependent COPD, presenting with left-sided chest pain and paresthesias. Prior CABG.  EXAM: CHEST  2 VIEW  COMPARISON:  06/14/2014 and earlier, including CT chest 06/05/2014.  FINDINGS: Prior sternotomy CABG. Cardiac silhouette markedly enlarged but stable. Prominent right pulmonary artery accounting for the opacity in the right hilum as identified on the prior CT. Large hiatal hernia, unchanged. Mild diffuse interstitial pulmonary edema with Kerley B-lines, new since the most recent examination 06/14/2014. Interval increase in size of a now moderately large right pleural effusion. Stable small left pleural effusion. New consolidation in the anterior right lower lobe. Osseous demineralization, thoracolumbar scoliosis, and exaggeration of the usual thoracic kyphosis.  IMPRESSION: 1. Mild CHF, with stable marked  cardiomegaly and mild diffuse interstitial pulmonary edema. 2. Moderately large right pleural effusion, increased since 06/14/2014. Stable small left pleural effusion. (The patient has chronic bilateral effusions. ) 3. New consolidation in the anterior right lower lobe, passive atelectasis versus pneumonia. 4. Stable large hiatal hernia.   Electronically Signed   By: Evangeline Dakin M.D.   On: 08/03/2014 20:17    ASSESSMENT AND PLAN  81 y/o female with a history of CAD s/p CABG (09/2008, LIMA to LAD and free RIMA Y graft to OM off of LIMA with ortic root replacement) ischemic cardiomyopathy (30% to 35%, 0/1779), chronic systolic CHF, HTN, HLD, tobacco abuse, COPD on 2L oxygen, CKD, PAD, and severe mitral regurgitation, readmitted for a/c CHF and chest pain. She has had multiple readmissions for acute HF secondary to her severe mitral valve disease over the last several months.   1. Acute on Chronic Systolic CHF:   - EF 39% on echo 08/04/2014 and unchanged compared to prior.   - She has diuresed well. I/Os net negative 7L. Dyspnea resolved. Euvolemic on physical exam. Continue PO Lasix, coreg, hydralazine and imdur.   2. CAD: h/o CABG in 2010.   - cath 08/06/2014 patent DES to prox RCA, 70% prox to mid LCx, 50% prox to  mid LAD, 90% OM1, 75% D1, EF 15-20%. Severe MR.   - per Dr. Tamala Julian, in the absence of anginal pattern, intervention on OM1 is questionable and potentially harmful.    3. Severe Mitral Valve Regurgitation:  - Per Dr. Tamala Julian, severe MR likely significant contribution from annular dilatation, however may have possible ischemic component and intrinsic mitral valve dx. Felt with significant deterioration of LV function, impact of mitral clip on symptoms may be minimal   - discussed with Dr. Ron Parker who has reviewed the cath report, will proceed with TEE today to complete all date to make best decisions  - discussed benefit and risk of TEE with family and patient include hypotension, bradycardia,  aspiration/acute respiratory failure, esophageal bleeding, hematoma, or rupture. They displayed clear understanding and agree to proceed. Her BP has been in 110s. Euvolemic on exam. Generally stable at this point  4. CKD: Cr stable after cath  Hilbert Corrigan PA-C Pager: [phone] Patient seen and examined. I agree with the assessment and plan as detailed above. See also my additional thoughts below.   Catheterization yesterday revealed that LV function was worse than previously noted. We have decided to proceed with TEE to be sure that all data is gathered before final decisions concerning any potential interventions.  Dola Argyle, MD, [facility] 08/07/2014 9:11 AM

## 2014-08-07 NOTE — Progress Notes (Signed)
[facility] PROGRESS NOTE  [name] TXM:[id number] DOB: Nov 21, 1927 DOA: 08/03/2014 PCP: Leonard Downing, MD Interim summary Patient is a pleasant 81 year old female with an established history of coronary artery disease, chronic combined systolic and diastolic congestive heart failure, hypertension, dyslipidemia, admitted to the medicine service on 08/03/2014 when she presented with complaints of chest pain associate with shortness of breath. Initial workup included a two-view chest x-ray that revealed findings consistent with CHF with cardiomegaly and diffuse interstitial pulmonary edema. Also noted was a moderately large right pleural effusion, that had increased in size compared to exam on 06/14/2014. Per radiology patient having chronic bilateral effusions. Troponins were stable at 0.03 and 0.04. Symptoms felt to be due to acute on chronic systolic congestive heart failure as she was started on Lasix 40 mg IV twice a day. She had cardiac catheterization done on 08/06/2014 showing widely patent drug-eluting stent to the proximal RCA, proximal circumflex to mid circumflex lesion 70% stenosis, proximal LAD 2 mid LAD lesion 50% stenosis, first marginal containing ostial 90% stenosis, first diagonal branches containing 75% stenosis. Patient found having EF of 15-20%. She did not undergo percutaneous intervention and cardiology recommended aggressive medical therapy. With regard to severe mitral regurgitation it is unclear the benefits of mitral clip in her case. Plan to further evaluate with a transesophageal echocardiogram. This morning patient going into A. fib with RVR as cardiologist started IV amiodarone.   Assessment/Plan: 1. Acute on chronic combined systolic and diastolic congestive heart failure. -Patient presenting with clinical signs and symptoms consistent with acute CHF -Her last transthoracic echocardiogram was performed on 04/20/2014 that revealed an EF of 30-35% with  diffuse hypokinesis as well as grade 2 diastolic dysfunction. -She was seen and evaluated by cardiology feeling that severe MR contributing to CHF. -She was started on Lasix 40 mg IV twice a day with good urine output. -Has a net negative fluid balance of 7.4 L -Her weight coming down from 58.2 kg to 55 kg this morning. -Now on Lasix 40 mg by mouth twice a day -Cardiology recommending changing metoprolol to coreg  2.  Coronary artery disease. -Patient had reported having chest pain overnight. Troponins negative. -She had a cardiac catheterizations done on 06/04/2014 and 05/30/2014 showing 85% in-stent restenosis in the proximal RCA, proximal circumflex to mid circumflex 70% stenosis, first marginal lesion 80% stenosis, first on the lesion 90% stenosis in proximal LAD to mid LAD lesion 50% stenosis. She had stenting with drug-eluting stent on 06/04/2014, with plans for staged PCI given chronic kidney disease.  -Cardiology felt that circumflex and first marginal stenosis could be causing ischemic MR -Patient undergoing cardiac catheterization on 08/06/2014 showing widely patent drug-eluting stent to the proximal RCA, proximal circumflex to mid circumflex lesion 70% stenosis, proximal LAD 2 mid LAD lesion 50% stenosis, first marginal containing ostial 90% stenosis, first diagonal branches containing 75% stenosis. Patient found having EF of 15-20%.  -Cardiology recommending continuing medical management, she did not undergo percutaneous intervention  3.  Atrial fibrillation with rapid ventricular response -On 08/07/2014 she was found to be in A. fib with RVR having ventricular rates in the 150s -She was started on an amiodarone drip as transesophageal echocardiogram was postponed.  4.  Mitral valve regurgitation -Transthoracic echocardiogram from 04/20/2014 showing severe regurgitation -Possibility of mitral clip had been discussed, unclear benefits, TEE had been planned for today -Transesophageal  echocardiogram was postponed due to A. fib with RVR  5. Hypertension -Continue Imdur dose to 15 mg PO daily and  coreg 3.125 mg PO BID  6.  Dyslipidemia -Continue pravastatin 80 mg by mouth daily  Code Status: Full code Family Communication: Spoke with her daughter was present at bedside Disposition Plan: TEE postponed   Consultants:  Radiology  Procedures:  Transthoracic echocardiogram impression: Left ventricle: The cavity size was moderately dilated. Wall thickness was normal. The estimated ejection fraction was 30%. Diffuse hypokinesis. Findings consistent with left ventricular diastolic dysfunction. Doppler parameters are consistent with high ventricular filling pressure. Mildly thickened leaflets . There was severe regurgitation.  HPI/Subjective: Patient reporting having palpitations this morning, felt to be in A. fib with RVR. She currently denies chest pain or shortness of breath  Objective: Filed Vitals:   08/07/14 1339  BP: 90/49  Pulse: 78  Temp: 97.8 F (36.6 C)  Resp: 18    Intake/Output Summary (Last 24 hours) at 08/07/14 1532 Last data filed at 08/07/14 1309  Gross per 24 hour  Intake 364.17 ml  Output   1550 ml  Net -1185.83 ml   Filed Weights   08/05/14 0513 08/06/14 0643 08/07/14 0237  Weight: 56.337 kg (124 lb 3.2 oz) 55.702 kg (122 lb 12.8 oz) 55.384 kg (122 lb 1.6 oz)    Exam:   General:  Reported having palpitations this morning  Cardiovascular: Regular rate and rhythm normal S1-S2 no murmurs rubs or gallops, improvement to lower extremity edema  Respiratory: Having faint crackles to bilateral bases, breathing currently on room air  Abdomen: Soft nontender nondistended  Musculoskeletal: resolution of edema  Data Reviewed: Basic Metabolic Panel:  Recent Labs Lab 08/03/14 2021 08/04/14 0543 08/05/14 0331 08/06/14 0335 08/06/14 1854 08/07/14 0345  NA 135 138 137 140  --  135  K 4.6 3.9 3.8 3.6  --  3.7  CL 97*  99* 98* 101  --  95*  CO2 24 29 30  32  --  31  GLUCOSE 113* 87 81 81  --  82  BUN 30* 28* 28* 21*  --  17  CREATININE 1.72* 1.69* 1.69* 1.60* 1.65* 1.65*  CALCIUM 9.3 8.9 8.4* 8.9  --  9.0   Liver Function Tests: No results for input(s): AST, ALT, ALKPHOS, BILITOT, PROT, ALBUMIN in the last 168 hours. No results for input(s): LIPASE, AMYLASE in the last 168 hours. No results for input(s): AMMONIA in the last 168 hours. CBC:  Recent Labs Lab 08/03/14 2021 08/04/14 1120 08/06/14 1854 08/07/14 0345  WBC 7.2 5.7 6.0 6.0  HGB 9.4* 9.3* 10.2* 10.3*  HCT 30.8* 30.3* 32.7* 33.3*  MCV 80.6 81.5 82.0 82.0  PLT 512* 380 486* 503*   Cardiac Enzymes:  Recent Labs Lab 08/04/14 0008 08/04/14 0543 08/04/14 1120  TROPONINI 0.03 0.04* 0.04*   BNP (last 3 results)  Recent Labs  05/22/14 0409 05/29/14 1000 08/03/14 2021  BNP 801.8* 851.1* 2801.4*    ProBNP (last 3 results) No results for input(s): PROBNP in the last 8760 hours.  CBG: No results for input(s): GLUCAP in the last 168 hours.  No results found for this or any previous visit (from the past 240 hour(s)).   Studies: No results found.  Scheduled Meds: . aspirin EC  81 mg Oral Daily  . carvedilol  3.125 mg Oral BID WC  . clopidogrel  75 mg Oral Q breakfast  . furosemide  40 mg Oral BID  . hydrALAZINE  10 mg Oral TID  . isosorbide mononitrate  15 mg Oral Daily  . potassium chloride  10 mEq Oral Daily  .  pravastatin  80 mg Oral QAC supper  . sodium chloride  3 mL Intravenous Q12H  . sodium chloride  3 mL Intravenous Q12H   Continuous Infusions: . sodium chloride 20 mL (08/07/14 0855)  . amiodarone 60 mg/hr (08/07/14 1255)   Followed by  . amiodarone    . heparin 850 Units/hr (08/07/14 1025)    Active Problems:   Hyperlipidemia   Essential hypertension   CAD-native   Chronic combined systolic and diastolic heart failure, NYHA class 2   COPD (chronic obstructive pulmonary disease)   CKD (chronic kidney  disease), stage IV   Acute systolic congestive heart failure   Malnutrition of moderate degree   Acute systolic CHF (congestive heart failure), NYHA class 4   Severe mitral regurgitation    Time spent: 35 min    Kelvin Cellar  [facility] Pager [phone]. If 7PM-7AM, please contact night-coverage at [email or web address], password [facility] 08/07/2014, 3:32 PM  LOS: 4 days

## 2014-08-07 NOTE — Progress Notes (Signed)
At [place] informed by Dr. Coralyn Pear that someone will be up to  See pt  Encourage pt to relax and lay on her side until MD sees her with further instruction.  No rectal bleeding or oozing noted.  Will continue to monitor.  Sheldon Amara, Therapist, sports.

## 2014-08-08 DIAGNOSIS — I480 Paroxysmal atrial fibrillation: Secondary | ICD-10-CM

## 2014-08-08 LAB — BASIC METABOLIC PANEL
Anion gap: 8 (ref 5–15)
BUN: 20 mg/dL (ref 6–20)
CO2: 28 mmol/L (ref 22–32)
Calcium: 8.8 mg/dL — ABNORMAL LOW (ref 8.9–10.3)
Chloride: 99 mmol/L — ABNORMAL LOW (ref 101–111)
Creatinine, Ser: 2.02 mg/dL — ABNORMAL HIGH (ref 0.44–1.00)
GFR calc Af Amer: 25 mL/min — ABNORMAL LOW (ref >60)
GFR calc non Af Amer: 21 mL/min — ABNORMAL LOW (ref >60)
Glucose, Bld: 96 mg/dL (ref 65–99)
Potassium: 3.7 mmol/L (ref 3.5–5.1)
SODIUM: 135 mmol/L (ref 135–145)

## 2014-08-08 LAB — CBC
HCT: 29.6 % — ABNORMAL LOW (ref 36.0–46.0)
Hemoglobin: 9.2 g/dL — ABNORMAL LOW (ref 12.0–15.0)
MCH: 25.2 pg — ABNORMAL LOW (ref 26.0–34.0)
MCHC: 31.1 g/dL (ref 30.0–36.0)
MCV: 81.1 fL (ref 78.0–100.0)
PLATELETS: 411 10*3/uL — AB (ref 150–400)
RBC: 3.65 MIL/uL — ABNORMAL LOW (ref 3.87–5.11)
RDW: 17.3 % — ABNORMAL HIGH (ref 11.5–15.5)
WBC: 6.3 10*3/uL (ref 4.0–10.5)

## 2014-08-08 LAB — HEPARIN LEVEL (UNFRACTIONATED): Heparin Unfractionated: 0.34 IU/mL (ref 0.30–0.70)

## 2014-08-08 MED ORDER — POLYETHYLENE GLYCOL 3350 17 G PO PACK
17.0000 g | PACK | Freq: Two times a day (BID) | ORAL | Status: DC
  Administered 2014-08-10 – 2014-08-15 (×5): 17 g via ORAL
  Filled 2014-08-08 (×16): qty 1

## 2014-08-08 MED ORDER — AMIODARONE HCL 200 MG PO TABS
200.0000 mg | ORAL_TABLET | Freq: Two times a day (BID) | ORAL | Status: DC
  Administered 2014-08-08 – 2014-08-15 (×14): 200 mg via ORAL
  Filled 2014-08-08 (×16): qty 1

## 2014-08-08 NOTE — Progress Notes (Signed)
[facility] PROGRESS NOTE  [name] YKD:[id number] DOB: 1927/06/29 DOA: 08/03/2014 PCP: Leonard Downing, MD  Assessment/Plan: 1. Acute on chronic combined systolic and diastolic congestive heart failure. -Patient presenting with clinical signs and symptoms consistent with acute CHF -Her last transthoracic echocardiogram was performed on 04/20/2014 that revealed an EF of 30-35% with diffuse hypokinesis as well as grade 2 diastolic dysfunction. -She was seen and evaluated by cardiology feeling that severe MR contributing to CHF, along with PAF. -Has a net negative fluid balance of 7.8 L -Her weight coming down from 58.2 kg to 55 kg this morning. -continue lasix PO 40mg  BID; volume appears to be stable currently  -Cardiology recommended to use coreg  2.  Coronary artery disease. -Patient had reported having chest pain overnight. Troponins negative. -She had a cardiac catheterizations done on 06/04/2014 and 05/30/2014 showing 85% in-stent restenosis in the proximal RCA, proximal circumflex to mid circumflex 70% stenosis, first marginal lesion 80% stenosis, first on the lesion 90% stenosis in proximal LAD to mid LAD lesion 50% stenosis. She had stenting with drug-eluting stent on 06/04/2014, with plans for staged PCI given chronic kidney disease.  -Cardiology felt that circumflex and first marginal stenosis could be causing ischemic MR -Patient undergoing cardiac catheterization on 08/06/2014 showing widely patent drug-eluting stent to the proximal RCA, proximal circumflex to mid circumflex lesion 70% stenosis, proximal LAD 2 mid LAD lesion 50% stenosis, first marginal containing ostial 90% stenosis, first diagonal branches containing 75% stenosis.  -Patient found having EF of 15-20%.  -Cardiology recommending continuing medical management -continue b-blocker, ASA and Plavix. No CP reported by patient   3.  Atrial fibrillation with rapid ventricular response -On 08/07/2014 she  was found to be in A. fib with RVR having ventricular rates in the 150s -She was started on an amiodarone drip and hep drip -per cardiology will d/c heparin and continue amiodarone -patient sinus rhythm now  4.  Mitral valve regurgitation -Transthoracic echocardiogram from 04/20/2014 showing severe regurgitation -Possibility of mitral clip had been discussed, unclear benefits, TEE had been planned for this admission, but patient experienced A. Fib with RVR and has now been cancelled -will continue controlling volume and arrhyhtmia -will follow cardiology rec's  5. Hypertension -Continue Imdur dose to 15 mg PO daily and coreg 3.125 mg PO BID  6.  Dyslipidemia -Continue pravastatin 80 mg by mouth daily  Code Status: Full code Family Communication: Spoke with her daughter was present at bedside Disposition Plan: continue medical therapy; remains inpatient. TEE postponed and without plans to do it this time    Consultants:  Cardiology  Procedures:  Transthoracic echocardiogram impression: Left ventricle: The cavity size was moderately dilated. Wall thickness was normal. The estimated ejection fraction was 30%. Diffuse hypokinesis. Findings consistent with left ventricular diastolic dysfunction. Doppler parameters are consistent with high ventricular filling pressure. Mildly thickened leaflets . There was severe regurgitation.  HPI/Subjective: Patient denies CP, SOB, nausea/vomiting, abd pain or any other complaints. Overnight she endorses experiencing sensation of rectocele, but resolved on its own   Objective: Filed Vitals:   08/08/14 1649  BP: 108/52  Pulse: 71  Temp:   Resp:     Intake/Output Summary (Last 24 hours) at 08/08/14 1727 Last data filed at 08/08/14 1627  Gross per 24 hour  Intake 1874.13 ml  Output    402 ml  Net 1472.13 ml   Filed Weights   08/06/14 0643 08/07/14 0237 08/08/14 0531  Weight: 55.702 kg (122 lb 12.8 oz) 55.384 kg (  122 lb  1.6 oz) 55.566 kg (122 lb 8 oz)    Exam:   General:  Feeling better, no CP and endorses breathing is stable. No fever, nausea, vomiting or any other complaints.  Cardiovascular: positive SEM, no rubs or gallops; no LE edema or JVD on exam  Respiratory: good air movement, no crackles on exam; no wheezing or rhonchi appreciated   Abdomen: Soft, nontender, nondistended  Extremities:  resolution of edema  Data Reviewed: Basic Metabolic Panel:  Recent Labs Lab 08/04/14 0543 08/05/14 0331 08/06/14 0335 08/06/14 1854 08/07/14 0345 08/08/14 0409  NA 138 137 140  --  135 135  K 3.9 3.8 3.6  --  3.7 3.7  CL 99* 98* 101  --  95* 99*  CO2 29 30 32  --  31 28  GLUCOSE 87 81 81  --  82 96  BUN 28* 28* 21*  --  17 20  CREATININE 1.69* 1.69* 1.60* 1.65* 1.65* 2.02*  CALCIUM 8.9 8.4* 8.9  --  9.0 8.8*   CBC:  Recent Labs Lab 08/03/14 2021 08/04/14 1120 08/06/14 1854 08/07/14 0345 08/08/14 0409  WBC 7.2 5.7 6.0 6.0 6.3  HGB 9.4* 9.3* 10.2* 10.3* 9.2*  HCT 30.8* 30.3* 32.7* 33.3* 29.6*  MCV 80.6 81.5 82.0 82.0 81.1  PLT 512* 380 486* 503* 411*   Cardiac Enzymes:  Recent Labs Lab 08/04/14 0008 08/04/14 0543 08/04/14 1120  TROPONINI 0.03 0.04* 0.04*   BNP (last 3 results)  Recent Labs  05/22/14 0409 05/29/14 1000 08/03/14 2021  BNP 801.8* 851.1* 2801.4*    Studies: No results found.  Scheduled Meds: . amiodarone  200 mg Oral BID  . aspirin EC  81 mg Oral Daily  . carvedilol  3.125 mg Oral BID WC  . clopidogrel  75 mg Oral Q breakfast  . docusate sodium  100 mg Oral BID  . furosemide  40 mg Oral BID  . hydrALAZINE  10 mg Oral TID  . isosorbide mononitrate  15 mg Oral Daily  . polyethylene glycol  17 g Oral Daily  . potassium chloride  10 mEq Oral Daily  . pravastatin  80 mg Oral QAC supper  . sodium chloride  3 mL Intravenous Q12H  . sodium chloride  3 mL Intravenous Q12H   Continuous Infusions: . sodium chloride 20 mL (08/07/14 0855)  . amiodarone  30 mg/hr (08/08/14 0717)  . heparin 900 Units/hr (08/08/14 0716)    Active Problems:   Hyperlipidemia   Essential hypertension   CAD-native   Chronic combined systolic and diastolic heart failure, NYHA class 2   COPD (chronic obstructive pulmonary disease)   CKD (chronic kidney disease), stage IV   Acute systolic congestive heart failure   Malnutrition of moderate degree   Acute systolic CHF (congestive heart failure), NYHA class 4   Severe mitral regurgitation   Atrial fibrillation with RVR   Paroxysmal atrial fibrillation   Time spent: 35 min   [facility] Pager [phone]. If 7PM-7AM, please contact night-coverage at [email or web address], password [email or web address] 08/08/2014, 5:27 PM  LOS: 5 days

## 2014-08-08 NOTE — Progress Notes (Addendum)
ANTICOAGULATION CONSULT NOTE - Follow Up Consult  Pharmacy Consult for heparin Indication: atrial fibrillation  No Known Allergies  Patient Measurements: Height: 5\' 6"  (167.6 cm) Weight: 122 lb 8 oz (55.566 kg) (a scale) IBW/kg (Calculated) : 59.3  Vital Signs: Temp: 97.9 F (36.6 C) (07/27 0531) Temp Source: Oral (07/27 0531) BP: 115/45 mmHg (07/27 0531) Pulse Rate: 81 (07/27 0531)  Labs:  Recent Labs  08/06/14 1854 08/07/14 0345 08/07/14 1934 08/08/14 0409  HGB 10.2* 10.3*  --  9.2*  HCT 32.7* 33.3*  --  29.6*  PLT 486* 503*  --  411*  HEPARINUNFRC  --   --  0.31 0.34  CREATININE 1.65* 1.65*  --  2.02*    Estimated Creatinine Clearance: 17.9 mL/min (by C-G formula based on Cr of 2.02).  Assessment:  81 yo F presents on 7/22 with SOB and chest pressure. On 7/26 she has developed Afib with RVR with the HR in the 150s. She is starting on an amio gtt and pharmacy consulted to start heparin.  HL 0.34, Hgb 9.2, plts 411. No signs of bleeding. Pt is currently in NSR on amio gtt.  Goal of Therapy:  Heparin level 0.3-0.7 units/ml Monitor platelets by anticoagulation protocol: Yes   Plan:  Continue heparin gtt 900 units/hr Monitor daily HL, CBC, s/s of bleed  Angela Burke, PharmD Pharmacy Resident Pager: [phone] 08/08/2014,8:24 AM

## 2014-08-08 NOTE — Progress Notes (Signed)
SUBJECTIVE:  The patient is stable today. Her overall situation is quite complex. She has significant coronary disease. She underwent staged PCI in May, 2016. She is on dual antiplatelets therapy for this. She has had recurrent admissions for CHF. This admission we decided to assess her mitral valve further. She had follow-up catheterization and her coronaries were stable, not requiring further PCI. The next step was to proceed with a transesophageal echo. The day that she went for that procedure, she developed rapid atrial fibrillation. The procedure was canceled. She was placed on IV amiodarone and ultimately converted to sinus rhythm. It is our thinking that paroxysms of atrial fib may also play a role with her decompensating episodes. On this basis I decided not to proceed with transesophageal echo. The plan is to use amiodarone for her rhythm and follow her. The issue of anticoagulation is also complex. She has a hemoglobin of 9.4. She is on aspirin and Plavix for her stents. Her stents are fairly recent. I'm concerned that it is not prudent to use a combination of an anticoagulated and Plavix only in this setting. I'm concerned twice a day of using triple drugs with a hemoglobin of 52 in this 81 year old patient. She is on IV heparin here in the hospital since yesterday for her atrial fib. I will stop the heparin.   Filed Vitals:   08/07/14 1721 08/07/14 2131 08/08/14 0531 08/08/14 1100  BP: 105/51 108/48 115/45 104/61  Pulse: 84 94 81 75  Temp:  98 F (36.7 C) 97.9 F (36.6 C)   TempSrc:  Oral Oral   Resp:  17 18   Height:      Weight:   122 lb 8 oz (55.566 kg)   SpO2:  95% 93%      Intake/Output Summary (Last 24 hours) at 08/08/14 1344 Last data filed at 08/08/14 1343  Gross per 24 hour  Intake 5226.22 ml  Output    401 ml  Net 4825.22 ml    LABS: Basic Metabolic Panel:  Recent Labs  08/07/14 0345 08/08/14 0409  NA 135 135  K 3.7 3.7  CL 95* 99*  CO2 31 28  GLUCOSE 82  96  BUN 17 20  CREATININE 1.65* 2.02*  CALCIUM 9.0 8.8*   Liver Function Tests: No results for input(s): AST, ALT, ALKPHOS, BILITOT, PROT, ALBUMIN in the last 72 hours. No results for input(s): LIPASE, AMYLASE in the last 72 hours. CBC:  Recent Labs  08/07/14 0345 08/08/14 0409  WBC 6.0 6.3  HGB 10.3* 9.2*  HCT 33.3* 29.6*  MCV 82.0 81.1  PLT 503* 411*   Cardiac Enzymes: No results for input(s): CKTOTAL, CKMB, CKMBINDEX, TROPONINI in the last 72 hours. BNP: Invalid input(s): POCBNP D-Dimer: No results for input(s): DDIMER in the last 72 hours. Hemoglobin A1C: No results for input(s): HGBA1C in the last 72 hours. Fasting Lipid Panel: No results for input(s): CHOL, HDL, LDLCALC, TRIG, CHOLHDL, LDLDIRECT in the last 72 hours. Thyroid Function Tests: No results for input(s): TSH, T4TOTAL, T3FREE, THYROIDAB in the last 72 hours.  Invalid input(s): FREET3  RADIOLOGY: Dg Chest 2 View  08/05/2014   CLINICAL DATA:  Pleural effusion, shortness of breath  EXAM: CHEST  2 VIEW  COMPARISON:  08/03/2014  FINDINGS: Moderate enlargement of the cardiac silhouette noted. Hyperinflation and coarsened interstitial opacities suggest emphysema with possible superimposed interstitial edema. Bilateral hilar prominence with tapering may suggest pulmonary arterial hypertension. Small pleural effusions. Bones are subjectively osteopenic. Patchy bibasilar opacities  are most likely compressive atelectasis. Atheromatous aortic calcification noted.  IMPRESSION: Probable emphysematous change with cardiomegaly and possible superimposed interstitial edema.  Small pleural effusions.   Electronically Signed   By: Conchita Paris M.D.   On: 08/05/2014 09:55   Dg Chest 2 View  08/03/2014   CLINICAL DATA:  Former smoker with current history of oxygen dependent COPD, presenting with left-sided chest pain and paresthesias. Prior CABG.  EXAM: CHEST  2 VIEW  COMPARISON:  06/14/2014 and earlier, including CT chest  06/05/2014.  FINDINGS: Prior sternotomy CABG. Cardiac silhouette markedly enlarged but stable. Prominent right pulmonary artery accounting for the opacity in the right hilum as identified on the prior CT. Large hiatal hernia, unchanged. Mild diffuse interstitial pulmonary edema with Kerley B-lines, new since the most recent examination 06/14/2014. Interval increase in size of a now moderately large right pleural effusion. Stable small left pleural effusion. New consolidation in the anterior right lower lobe. Osseous demineralization, thoracolumbar scoliosis, and exaggeration of the usual thoracic kyphosis.  IMPRESSION: 1. Mild CHF, with stable marked cardiomegaly and mild diffuse interstitial pulmonary edema. 2. Moderately large right pleural effusion, increased since 06/14/2014. Stable small left pleural effusion. (The patient has chronic bilateral effusions. ) 3. New consolidation in the anterior right lower lobe, passive atelectasis versus pneumonia. 4. Stable large hiatal hernia.   Electronically Signed   By: Evangeline Dakin M.D.   On: 08/03/2014 20:17    PHYSICAL EXAM  patient is oriented to person time and place. Affect is normal. Head is atraumatic. Sclera and conjunctiva are normal. There are family members in the room. Lungs are clear. Respiratory effort is nonlabored. Cardiac exam reveals S1 and S2. Abdomen is soft. There is no peripheral edema.   TELEMETRY: I have reviewed telemetry today August 08, 2014. There is normal sinus rhythm.   ASSESSMENT AND PLAN:    Hyperlipidemia   Essential hypertension   CAD-native   Chronic combined systolic and diastolic heart failure,      The patient is having recurrent admissions with heart failure. This is related to her left ventricular dysfunction and her mitral regurgitation. We know it is not on an ischemic basis at this time as she was recathed this admission. We have thought about whether aggressive approach with a possible Mitraclip might be  appropriate. She then had an episode of rapid atrial fib. For now we will try to manage her atrial fibrillation with amiodarone and see how she does clinically.    COPD (chronic obstructive pulmonary disease)    CKD (chronic kidney disease), stage IV    Severe mitral regurgitation I have outlined above that we have considered an aggressive approach to her mitral valve. At this time the plan will be to try to manage her volume and manage her arrhythmia.        Atrial fibrillation with RVR   Paroxysmal atrial fibrillation     Amiodarone is now being used. This is new. I will add oral amiodarone and stop IV tomorrow. I have outlined above that I'm concerned about the drugs needed to treat her on an anticoagulation basis. She has recent coronary stents. I'm very hesitant to not use aspirin and Plavix in combination at this time. Her hemoglobin is only 9.4. I've chosen not to add an oral anticoagulated yet.    Dola Argyle 08/08/2014 1:44 PM

## 2014-08-09 DIAGNOSIS — I509 Heart failure, unspecified: Secondary | ICD-10-CM

## 2014-08-09 LAB — CBC
HCT: 27.3 % — ABNORMAL LOW (ref 36.0–46.0)
Hemoglobin: 8.6 g/dL — ABNORMAL LOW (ref 12.0–15.0)
MCH: 25.2 pg — AB (ref 26.0–34.0)
MCHC: 31.5 g/dL (ref 30.0–36.0)
MCV: 80.1 fL (ref 78.0–100.0)
Platelets: 405 10*3/uL — ABNORMAL HIGH (ref 150–400)
RBC: 3.41 MIL/uL — AB (ref 3.87–5.11)
RDW: 17.3 % — ABNORMAL HIGH (ref 11.5–15.5)
WBC: 5.7 10*3/uL (ref 4.0–10.5)

## 2014-08-09 LAB — BASIC METABOLIC PANEL
Anion gap: 10 (ref 5–15)
BUN: 23 mg/dL — AB (ref 6–20)
CALCIUM: 8.6 mg/dL — AB (ref 8.9–10.3)
CO2: 26 mmol/L (ref 22–32)
Chloride: 95 mmol/L — ABNORMAL LOW (ref 101–111)
Creatinine, Ser: 2.39 mg/dL — ABNORMAL HIGH (ref 0.44–1.00)
GFR, EST AFRICAN AMERICAN: 20 mL/min — AB (ref >60)
GFR, EST NON AFRICAN AMERICAN: 17 mL/min — AB (ref >60)
Glucose, Bld: 168 mg/dL — ABNORMAL HIGH (ref 65–99)
POTASSIUM: 4.1 mmol/L (ref 3.5–5.1)
Sodium: 131 mmol/L — ABNORMAL LOW (ref 135–145)

## 2014-08-09 LAB — HEPARIN LEVEL (UNFRACTIONATED): Heparin Unfractionated: 0.45 IU/mL (ref 0.30–0.70)

## 2014-08-09 NOTE — Progress Notes (Addendum)
[facility] PROGRESS NOTE  [name] LKG:[id number] DOB: 1927/02/22 DOA: 08/03/2014 PCP: Leonard Downing, MD  Assessment/Plan: 1. Acute on chronic combined systolic and diastolic congestive heart failure. -Patient presenting with clinical signs and symptoms consistent with acute CHF -Her last transthoracic echocardiogram was performed on 04/20/2014 that revealed an EF of 30-35% with diffuse hypokinesis as well as grade 2 diastolic dysfunction. -She was seen and evaluated by cardiology feeling that severe MR contributing to CHF, along with PAF. -Has a net negative fluid balance of 7.8 L -Cr up and plan is to hold lasix and follow trend. Volume is stable currently  -Cardiology recommended to use coreg  2.  Coronary artery disease. -Patient had reported having chest pain overnight. Troponins negative. -She had a cardiac catheterizations done on 06/04/2014 and 05/30/2014 showing 85% in-stent restenosis in the proximal RCA, proximal circumflex to mid circumflex 70% stenosis, first marginal lesion 80% stenosis, first on the lesion 90% stenosis in proximal LAD to mid LAD lesion 50% stenosis. She had stenting with drug-eluting stent on 06/04/2014, with plans for staged PCI given chronic kidney disease.  -Cardiology felt that circumflex and first marginal stenosis could be causing ischemic MR -Patient undergoing cardiac catheterization on 08/06/2014 showing widely patent drug-eluting stent to the proximal RCA, proximal circumflex to mid circumflex lesion 70% stenosis, proximal LAD 2 mid LAD lesion 50% stenosis, first marginal containing ostial 90% stenosis, first diagonal branches containing 75% stenosis.  -Patient found having EF of 15-20%.  -Cardiology recommending continuing medical management -continue b-blocker, ASA and Plavix. No CP reported by patient   3.  Atrial fibrillation with rapid ventricular response -On 08/07/2014 she was found to be in A. fib with RVR having  ventricular rates in the 150s -She was started on an amiodarone drip and hep drip -per cardiology will d/c heparin and continue amiodarone alone; plan is to transitioned it to PO -patient sinus rhythm now  4.  Mitral valve regurgitation -Transthoracic echocardiogram from 04/20/2014 showing severe regurgitation -Possibility of mitral clip had been discussed, unclear benefits, TEE had been planned for this admission, but patient experienced A. Fib with RVR and has now been cancelled -will continue controlling volume and arrhyhtmia -will follow cardiology rec's  5. Hypertension -Continue Imdur dose to 15 mg PO daily and coreg 3.125 mg PO BID -watch VS and try to avoid hypotension   6.  Dyslipidemia -Continue pravastatin 80 mg by mouth daily  7. Acute on chronic kidney dysfunction: stage 3-4 at baseline -most likely multifactorial: contras, hypotension and diuresis -will follow trend -hold diuretics for now   Code Status: Full code Family Communication: Spoke with her daughter was present at bedside Disposition Plan: continue medical therapy; remains inpatient. TEE postponed and without plans to do it this time    Consultants:  Cardiology  Procedures:  Transthoracic echocardiogram impression: Left ventricle: The cavity size was moderately dilated. Wall thickness was normal. The estimated ejection fraction was 30%. Diffuse hypokinesis. Findings consistent with left ventricular diastolic dysfunction. Doppler parameters are consistent with high ventricular filling pressure. Mildly thickened leaflets . There was severe regurgitation.  HPI/Subjective: Patient denies CP, SOB, nausea/vomiting, abd pain or any other complaints. No leg swelling and no having JVD  Objective: Filed Vitals:   08/09/14 1500  BP:   Pulse:   Temp: 97 F (36.1 C)  Resp:     Intake/Output Summary (Last 24 hours) at 08/09/14 1524 Last data filed at 08/09/14 1357  Gross per 24 hour   Intake 1959.04 ml  Output    401 ml  Net 1558.04 ml   Filed Weights   08/07/14 0237 08/08/14 0531 08/09/14 0648  Weight: 55.384 kg (122 lb 1.6 oz) 55.566 kg (122 lb 8 oz) 57.924 kg (127 lb 11.2 oz)    Exam:   General:  Feeling better, no CP and endorses breathing is good as well. Patient is expressing that she is ready to go home. No fever, nausea, vomiting or any other complaints.  Cardiovascular: positive SEM, no rubs or gallops; no LE edema or JVD on exam  Respiratory: good air movement, no crackles on exam; no wheezing or rhonchi appreciated   Abdomen: Soft, nontender, nondistended  Extremities:  resolution of edema, no cyanosis   Data Reviewed: Basic Metabolic Panel:  Recent Labs Lab 08/05/14 0331 08/06/14 0335 08/06/14 1854 08/07/14 0345 08/08/14 0409 08/09/14 0312  NA 137 140  --  135 135 131*  K 3.8 3.6  --  3.7 3.7 4.1  CL 98* 101  --  95* 99* 95*  CO2 30 32  --  31 28 26   GLUCOSE 81 81  --  82 96 168*  BUN 28* 21*  --  17 20 23*  CREATININE 1.69* 1.60* 1.65* 1.65* 2.02* 2.39*  CALCIUM 8.4* 8.9  --  9.0 8.8* 8.6*   CBC:  Recent Labs Lab 08/04/14 1120 08/06/14 1854 08/07/14 0345 08/08/14 0409 08/09/14 0312  WBC 5.7 6.0 6.0 6.3 5.7  HGB 9.3* 10.2* 10.3* 9.2* 8.6*  HCT 30.3* 32.7* 33.3* 29.6* 27.3*  MCV 81.5 82.0 82.0 81.1 80.1  PLT 380 486* 503* 411* 405*   Cardiac Enzymes:  Recent Labs Lab 08/04/14 0008 08/04/14 0543 08/04/14 1120  TROPONINI 0.03 0.04* 0.04*   BNP (last 3 results)  Recent Labs  05/22/14 0409 05/29/14 1000 08/03/14 2021  BNP 801.8* 851.1* 2801.4*    Studies: No results found.  Scheduled Meds: . amiodarone  200 mg Oral BID  . aspirin EC  81 mg Oral Daily  . carvedilol  3.125 mg Oral BID WC  . clopidogrel  75 mg Oral Q breakfast  . docusate sodium  100 mg Oral BID  . hydrALAZINE  10 mg Oral TID  . isosorbide mononitrate  15 mg Oral Daily  . polyethylene glycol  17 g Oral BID  . potassium chloride  10 mEq  Oral Daily  . pravastatin  80 mg Oral QAC supper  . sodium chloride  3 mL Intravenous Q12H  . sodium chloride  3 mL Intravenous Q12H   Continuous Infusions: . sodium chloride 20 mL (08/07/14 0855)    Active Problems:   Hyperlipidemia   Essential hypertension   CAD-native   Chronic combined systolic and diastolic heart failure, NYHA class 2   COPD (chronic obstructive pulmonary disease)   CKD (chronic kidney disease), stage IV   Acute systolic congestive heart failure   Malnutrition of moderate degree   Acute systolic CHF (congestive heart failure), NYHA class 4   Severe mitral regurgitation   Atrial fibrillation with RVR   Paroxysmal atrial fibrillation   Time spent: 35 min   [facility] Pager [phone]. If 7PM-7AM, please contact night-coverage at [email or web address], password [facility] 08/09/2014, 3:24 PM  LOS: 6 days

## 2014-08-09 NOTE — Progress Notes (Signed)
Heart Failure Navigator Consult Note  Presentation: [name] is an 81yo woman with PMH of CAD, systolic CHF, HTN, Heart murmur, COPD, CKD, anemia who presents for acute SOB and chest pressure. She notes that she has been SOB all of today. She is usually SOB when lying flat, but noticed that she was now SOB with sitting up and activity. She has also noted increased swelling in the legs which will not go down. She has had normal urinary output and notes no weight gain, but weight loss. She noticed no trigger, has not had a change in diet and has been taking her medications as prescribed. The chest pressure she notes as a sensation of not being able to draw a deep breath b/c something is pushing on her sternum. It is substernal and right sided, no radiation, came on at rest. + nausea all day per her. She denies dizziness, lightheadedness, syncope, radiation of chest pain, diaphoresis. Further symptoms include chronic pain and swelling in her legs, burning pain in her feet due to the swelling and varicose veins. She denied vomiting diarrhea, fever, chills, abdominal pain.   In the ED she has had a CXR which showed mild CHF, cardiomegaly, worsening right sided pleural effusion and no consolidation (no other signs of infection currently, no cough, sputum). She had a BNP which was elevated to 2800 and a TnI of 0.03. Last TTE in April showed EF of 35% and grade 2 diastolic dysfunction. She was given IV lasix 40mg  with good urination.   Her last admission was in May of this year for chest pain. She had a LHC and a stent placed. She is on dual antiplatelet therapy. During that admission, she also was noted to have LAD on a CT chest and she needs a repeat study.   Past Medical History  Diagnosis Date  . CAD (coronary artery disease)     a. 07/2008 Inf STEMI->RCA BMS;  b. 09/2008 CABGx2: LIMA->LAD, RIMA->OM as Y from LIMA;  b. 06/2011 MV EF 31%, bsaal to dist ant infarct, inf/inflat infarct  with mild to mod peri-infarct ischemia [facility]).  . Ischemic cardiomyopathy     a. 06/2011 Echo: EF 40%, inf/inflat AK and lat HK, aortic sclerosis w/ mild AI, Mild to mod MR (prev mod-sev), Gr 1 DD, mild TR, RVSP 14mmHg  . Systolic CHF, chronic     a. 06/2011 EF 40%  . Hypertension   . Hyperlipidemia   . Hiatal hernia     large  . Tobacco abuse   . Cerebrovascular disease   . COPD (chronic obstructive pulmonary disease)     presumed   . Arterial disease     peripheral arterial disease with claudication - bilateral SFA disease with ABI's 0.4-0.5 bilaterally   . Mitral regurgitation     a. prev Mod-Sev, read as mild to mod by echo 06/2011 [facility]).  . CKD (chronic kidney disease), stage IV     born with 1 kidney  . Thyroid nodule 10/2012    left dominant nodule cytology:non-neoplastic goiter.   . Cataracts, both eyes     not surgical  . Personal history of colonic polyps - adenomas 05/09/2013  . Anemia   . Systolic HF (heart failure)   . Hypertensive renal disease   . Hypertensive heart disease with congestive heart failure   . Aorta aneurysm   . LVH (left ventricular hypertrophy)   . Arteriosclerotic vascular disease   . Bronchiectasis   . Atherosclerosis  of abdominal aorta   . Heart murmur   . Shortness of breath dyspnea   . MI (myocardial infarction) 2010    History   Social History  . Marital Status: Widowed    Spouse Name: N/A  . Number of Children: Y  . Years of Education: N/A   Occupational History  . 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    Social History Main Topics  . Smoking status: Former Smoker -- 1.00 packs/day for 60 years    Types: Cigarettes    Quit date: 07/12/2008  . Smokeless tobacco: Never Used  . Alcohol Use: No  . Drug Use: No  . Sexual Activity: No   Other Topics Concern  . None   Social History Narrative   She lives in [place]. She is retired and widowed.     ECHO:Study Conclusions--08/04/14  -  Left ventricle: The cavity size was moderately dilated. Wall thickness was normal. The estimated ejection fraction was 30%. Diffuse hypokinesis. Findings consistent with left ventricular diastolic dysfunction. Doppler parameters are consistent with high ventricular filling pressure. - Aortic valve: Leaflets thickened and calcified, Mild AS. Mild AI. - Mitral valve: Mildly thickened leaflets . There was severe regurgitation. - Left atrium: The atrium was severely dilated. - Right ventricle: The cavity size was mildly dilated. Systolic function was mildly reduced. - Right atrium: The atrium was mildly dilated. - Tricuspid valve: There was mild-moderate regurgitation. - Pulmonary arteries: PA peak pressure: 35 mm Hg (S). - Pericardium, extracardiac: There was a left pleural effusion.  Transthoracic echocardiography. M-mode, complete 2D, spectral Doppler, and color Doppler. Birthdate: Patient birthdate: [birth year]. Age: Patient is 82 yr old. Sex: Gender: female. BMI: 20.7 kg/m^2. Blood pressure:   113/51 Patient status: Inpatient. Study date: Study date: 08/04/2014. Study time: 09:58 AM. Location: Bedside.  BNP    Component Value Date/Time   BNP 2801.4* 08/03/2014 2021    ProBNP    Component Value Date/Time   PROBNP 331.0* 11/19/2009 0545     Education Assessment and Provision:  Detailed education and instructions provided on heart failure disease management including the following:  Signs and symptoms of Heart Failure When to call the physician Importance of daily weights Low sodium diet Fluid restriction Medication management Anticipated future follow-up appointments  Patient education given on each of the above topics.  Patient acknowledges understanding and acceptance of all instructions.  I spoke with [name] briefly regarding her HF.  She tells me that she has had HF and "heart problems" for years.  She also is able to teach back most  topics listed above.  She tries to avoid salt and eat low sodium.  We reviewed high sodium foods to avoid.  She does have a scale but admits that she was reading it incorrectly prior to admission as she could not see the numbers well.  She has a new scale now.  She denies any issue with getting or taking prescribed medications.  She lives alone and has a daughter that lives locally who can assist/ support her if needed.   Education Materials:  "Living Better With Heart Failure" Booklet, Daily Weight Tracker Tool    High Risk Criteria for Readmission and/or Poor Patient Outcomes:   EF <30%- No 30%  2 or more admissions in 6 months- Yes  Difficult social situation- No  Demonstrates medication noncompliance- Denies   Barriers of Care:  Knowledge and compliance  Discharge Planning:  Plans to discharge to home alone--she has a daughter that is  local and visits frequently

## 2014-08-09 NOTE — Care Management Important Message (Signed)
Important Message  Patient Details  Name: [name] MRN: [id number] Date of Birth: 05/06/1927   Medicare Important Message Given:  Yes-third notification given    Pricilla Handler 08/09/2014, 12:11 PM

## 2014-08-09 NOTE — Care Management Important Message (Signed)
Important Message  Patient Details  Name: [name] MRN: [id number] Date of Birth: [birth year]   Medicare Important Message Given:  Yes-third notification given    Pricilla Handler 08/09/2014, 12:12 PM

## 2014-08-09 NOTE — Progress Notes (Addendum)
Patient Name: [name] Date of Encounter: 08/09/2014  Primary Cardiologist: Dr. Angelena Form   Active Problems:   Hyperlipidemia   Essential hypertension   CAD-native   Chronic combined systolic and diastolic heart failure, NYHA class 2   COPD (chronic obstructive pulmonary disease)   CKD (chronic kidney disease), stage IV   Acute systolic congestive heart failure   Malnutrition of moderate degree   Acute systolic CHF (congestive heart failure), NYHA class 4   Severe mitral regurgitation   Atrial fibrillation with RVR   Paroxysmal atrial fibrillation    SUBJECTIVE  Nauseated yesterday, received laxative, better now after BM. Denies any SOB or CP  CURRENT MEDS . amiodarone  200 mg Oral BID  . aspirin EC  81 mg Oral Daily  . carvedilol  3.125 mg Oral BID WC  . clopidogrel  75 mg Oral Q breakfast  . docusate sodium  100 mg Oral BID  . furosemide  40 mg Oral BID  . hydrALAZINE  10 mg Oral TID  . isosorbide mononitrate  15 mg Oral Daily  . polyethylene glycol  17 g Oral BID  . potassium chloride  10 mEq Oral Daily  . pravastatin  80 mg Oral QAC supper  . sodium chloride  3 mL Intravenous Q12H  . sodium chloride  3 mL Intravenous Q12H    OBJECTIVE  Filed Vitals:   08/08/14 1649 08/08/14 2033 08/09/14 0110 08/09/14 0648  BP: 108/52 98/49 97/53  117/56  Pulse: 71 66 65 77  Temp:  98.1 F (36.7 C) 98 F (36.7 C) 97.8 F (36.6 C)  TempSrc:  Oral Oral Oral  Resp:   16 16  Height:      Weight:    127 lb 11.2 oz (57.924 kg)  SpO2:  100% 95% 95%    Intake/Output Summary (Last 24 hours) at 08/09/14 0747 Last data filed at 08/09/14 0658  Gross per 24 hour  Intake 1589.04 ml  Output    677 ml  Net 912.04 ml   Filed Weights   08/07/14 0237 08/08/14 0531 08/09/14 0648  Weight: 122 lb 1.6 oz (55.384 kg) 122 lb 8 oz (55.566 kg) 127 lb 11.2 oz (57.924 kg)    PHYSICAL EXAM  General: Pleasant, NAD. Neuro: Alert and oriented X 3. Moves all extremities  spontaneously. Psych: Normal affect. HEENT:  Normal  Neck: Supple without bruits or JVD. Lungs:  Resp regular and unlabored, CTA. Heart: RRR no s3, s4, or murmurs. Abdomen: Soft, non-tender, non-distended, BS + x 4.   Extremities: No clubbing, cyanosis or edema. DP/PT/Radials 2+ and equal bilaterally.  Accessory Clinical Findings  CBC  Recent Labs  08/08/14 0409 08/09/14 0312  WBC 6.3 5.7  HGB 9.2* 8.6*  HCT 29.6* 27.3*  MCV 81.1 80.1  PLT 411* 650*   Basic Metabolic Panel  Recent Labs  08/08/14 0409 08/09/14 0312  NA 135 131*  K 3.7 4.1  CL 99* 95*  CO2 28 26  GLUCOSE 96 168*  BUN 20 23*  CREATININE 2.02* 2.39*  CALCIUM 8.8* 8.6*    TELE NSR without significant ventricular ectopy or recurrent a-fib    ECG  No new EKG  Echocardiogram 08/04/2014  LV EF: 30%  ------------------------------------------------------------------- Indications:   CHF - 428.0.  ------------------------------------------------------------------- History:  PMH:  Coronary artery disease. Congestive heart failure. Aortic valve disease. Mitral valve disease. Chronic obstructive pulmonary disease. PMH:  Myocardial infarction. Risk factors: Former tobacco use. Hypertension.  ------------------------------------------------------------------- Study Conclusions  - Left ventricle:  The cavity size was moderately dilated. Wall thickness was normal. The estimated ejection fraction was 30%. Diffuse hypokinesis. Findings consistent with left ventricular diastolic dysfunction. Doppler parameters are consistent with high ventricular filling pressure. - Aortic valve: Leaflets thickened and calcified, Mild AS. Mild AI. - Mitral valve: Mildly thickened leaflets . There was severe regurgitation. - Left atrium: The atrium was severely dilated. - Right ventricle: The cavity size was mildly dilated. Systolic function was mildly reduced. - Right atrium: The atrium was  mildly dilated. - Tricuspid valve: There was mild-moderate regurgitation. - Pulmonary arteries: PA peak pressure: 35 mm Hg (S). - Pericardium, extracardiac: There was a left pleural effusion.    Radiology/Studies  Dg Chest 2 View  08/05/2014   CLINICAL DATA:  Pleural effusion, shortness of breath  EXAM: CHEST  2 VIEW  COMPARISON:  08/03/2014  FINDINGS: Moderate enlargement of the cardiac silhouette noted. Hyperinflation and coarsened interstitial opacities suggest emphysema with possible superimposed interstitial edema. Bilateral hilar prominence with tapering may suggest pulmonary arterial hypertension. Small pleural effusions. Bones are subjectively osteopenic. Patchy bibasilar opacities are most likely compressive atelectasis. Atheromatous aortic calcification noted.  IMPRESSION: Probable emphysematous change with cardiomegaly and possible superimposed interstitial edema.  Small pleural effusions.   Electronically Signed   By: Conchita Paris M.D.   On: 08/05/2014 09:55   Dg Chest 2 View  08/03/2014   CLINICAL DATA:  Former smoker with current history of oxygen dependent COPD, presenting with left-sided chest pain and paresthesias. Prior CABG.  EXAM: CHEST  2 VIEW  COMPARISON:  06/14/2014 and earlier, including CT chest 06/05/2014.  FINDINGS: Prior sternotomy CABG. Cardiac silhouette markedly enlarged but stable. Prominent right pulmonary artery accounting for the opacity in the right hilum as identified on the prior CT. Large hiatal hernia, unchanged. Mild diffuse interstitial pulmonary edema with Kerley B-lines, new since the most recent examination 06/14/2014. Interval increase in size of a now moderately large right pleural effusion. Stable small left pleural effusion. New consolidation in the anterior right lower lobe. Osseous demineralization, thoracolumbar scoliosis, and exaggeration of the usual thoracic kyphosis.  IMPRESSION: 1. Mild CHF, with stable marked cardiomegaly and mild diffuse  interstitial pulmonary edema. 2. Moderately large right pleural effusion, increased since 06/14/2014. Stable small left pleural effusion. (The patient has chronic bilateral effusions. ) 3. New consolidation in the anterior right lower lobe, passive atelectasis versus pneumonia. 4. Stable large hiatal hernia.   Electronically Signed   By: Evangeline Dakin M.D.   On: 08/03/2014 20:17    ASSESSMENT AND PLAN  81 y/o female with a history of CAD s/p CABG (09/2008, LIMA to LAD and free RIMA Y graft to OM off of LIMA with ortic root replacement) ischemic cardiomyopathy (30% to 35%, 03/8754), chronic systolic CHF, HTN, HLD, tobacco abuse, COPD on 2L oxygen, CKD, PAD, and severe mitral regurgitation, readmitted for a/c CHF and chest pain. She has had multiple readmissions for acute HF secondary to her severe mitral valve disease over the last several months.   1. Acute on Chronic Systolic CHF:   - EF 43% on echo 08/04/2014 and unchanged compared to prior.   - She has diuresed well on IV lasix. Cr was 1.6. In the last 2 days had +5L intake. Weight increased to 127 from 122 lbs 2 days ago. However appears to be euvolemic on exam, lung largely clear. Cr worsened from 1.6 to 2.3 today. Likely related to some degree of contrast nephropathy given cath on 7/25 and worsening renal function on 7/27,  and less likely due to fluid overload despite having increase weight and fluid intake. Hopefully Cr will plateau. Not ready for discharge. Will hold lasix for now.  2. CAD: h/o CABG in 2010.   - cath 08/06/2014 patent DES to prox RCA, 70% prox to mid LCx, 50% prox to mid LAD, 90% OM1, 75% D1, EF 15-20%. Severe MR.   - per Dr. Tamala Julian, in the absence of anginal pattern, intervention on OM1 is questionable and potentially harmful.    3. Severe Mitral Valve Regurgitation:  - Per Dr. Tamala Julian, severe MR likely significant contribution from annular dilatation, however may have possible ischemic component and intrinsic mitral valve dx.  Felt with significant deterioration of LV function, impact of mitral clip on symptoms may be minimal   - TEE cancelled as pt went into new afib with RVR after leaving telemetry floor and arriving in Endo. Per MD, her paroxysms of atrial fib may also play a role with her decompensating episodes. On this basis decided not to proceed with transesophageal echo.   4. Acute on chronic renal insufficiency  5. Newly onset PAF with RVR: Went into PAF on 7/26 somewhere between leaving telemetry floor and arriving at Endo for TEE, TEE was cancelled as result  - CHA2DS2-Vasc score 6 (female, age, CAD, HF, HTN)  - placed on IV amio, converted to NSR on the same day  - per MD, will hold off on systemic anticoagulation as she is on ASA and plavix and has baseline anemia.   - her metoprolol was changed to Coreg during this admission for LV dysfunction, if has recurrence of PAF in the future, may need to change back to metoprolol  - continue amiodarone 200mg  BID, likely can transition to 200mg  daily in 1 month  Signed, Woodward Ku Pager: [phone]  Patient seen and examined. I agree with the assessment and plan as detailed above. See also my additional thoughts below.   I had hoped the patient would be ready to go home today. However creatinine has gone up to 2.3. She'll be kept in the hospital until we are sure her renal status is stable. She does not have any edema. I agree that she does not appear to be volume overloaded, even though her weight has gone up some.  Dola Argyle, MD, [facility] 08/09/2014 10:11 AM

## 2014-08-10 ENCOUNTER — Inpatient Hospital Stay (HOSPITAL_COMMUNITY): Payer: Medicare Other

## 2014-08-10 DIAGNOSIS — I5023 Acute on chronic systolic (congestive) heart failure: Secondary | ICD-10-CM | POA: Insufficient documentation

## 2014-08-10 DIAGNOSIS — D638 Anemia in other chronic diseases classified elsewhere: Secondary | ICD-10-CM | POA: Insufficient documentation

## 2014-08-10 LAB — CBC
HCT: 26.5 % — ABNORMAL LOW (ref 36.0–46.0)
HCT: 29 % — ABNORMAL LOW (ref 36.0–46.0)
Hemoglobin: 8.3 g/dL — ABNORMAL LOW (ref 12.0–15.0)
Hemoglobin: 9.1 g/dL — ABNORMAL LOW (ref 12.0–15.0)
MCH: 24.9 pg — AB (ref 26.0–34.0)
MCH: 24.8 pg — ABNORMAL LOW (ref 26.0–34.0)
MCHC: 31.3 g/dL (ref 30.0–36.0)
MCHC: 31.4 g/dL (ref 30.0–36.0)
MCV: 79.3 fL (ref 78.0–100.0)
MCV: 79 fL (ref 78.0–100.0)
PLATELETS: 429 10*3/uL — AB (ref 150–400)
Platelets: 384 10*3/uL (ref 150–400)
RBC: 3.34 MIL/uL — AB (ref 3.87–5.11)
RBC: 3.67 MIL/uL — AB (ref 3.87–5.11)
RDW: 17.3 % — AB (ref 11.5–15.5)
RDW: 17.2 % — AB (ref 11.5–15.5)
WBC: 5.1 10*3/uL (ref 4.0–10.5)
WBC: 5.6 10*3/uL (ref 4.0–10.5)

## 2014-08-10 LAB — BASIC METABOLIC PANEL
Anion gap: 10 (ref 5–15)
BUN: 30 mg/dL — ABNORMAL HIGH (ref 6–20)
CO2: 25 mmol/L (ref 22–32)
CREATININE: 2.76 mg/dL — AB (ref 0.44–1.00)
Calcium: 9 mg/dL (ref 8.9–10.3)
Chloride: 93 mmol/L — ABNORMAL LOW (ref 101–111)
GFR calc Af Amer: 17 mL/min — ABNORMAL LOW (ref >60)
GFR calc non Af Amer: 15 mL/min — ABNORMAL LOW (ref >60)
Glucose, Bld: 108 mg/dL — ABNORMAL HIGH (ref 65–99)
POTASSIUM: 4.4 mmol/L (ref 3.5–5.1)
SODIUM: 128 mmol/L — AB (ref 135–145)

## 2014-08-10 LAB — HEPARIN LEVEL (UNFRACTIONATED): Heparin Unfractionated: <0.1 IU/mL — ABNORMAL LOW (ref 0.30–0.70)

## 2014-08-10 MED ORDER — CYCLOBENZAPRINE HCL 5 MG PO TABS
5.0000 mg | ORAL_TABLET | Freq: Once | ORAL | Status: AC
  Administered 2014-08-10: 5 mg via ORAL
  Filled 2014-08-10: qty 1

## 2014-08-10 MED ORDER — HYDROCODONE-ACETAMINOPHEN 5-325 MG PO TABS
1.0000 | ORAL_TABLET | Freq: Once | ORAL | Status: AC
  Administered 2014-08-10: 1 via ORAL
  Filled 2014-08-10: qty 1

## 2014-08-10 NOTE — Progress Notes (Signed)
Patient Name: [name] Date of Encounter: 08/10/2014  Primary Cardiologist: Dr. Angelena Form   Active Problems:   Hyperlipidemia   Essential hypertension   CAD-native   Chronic combined systolic and diastolic heart failure, NYHA class 2   COPD (chronic obstructive pulmonary disease)   CKD (chronic kidney disease), stage IV   Acute systolic congestive heart failure   Malnutrition of moderate degree   Acute systolic CHF (congestive heart failure), NYHA class 4   Severe mitral regurgitation   Atrial fibrillation with RVR   Paroxysmal atrial fibrillation   CHF exacerbation    SUBJECTIVE  No SOB or CP, had some back pain from laying on her back for too long. Ambulating with son yesterday without significant discomfort or SOB.   CURRENT MEDS . amiodarone  200 mg Oral BID  . aspirin EC  81 mg Oral Daily  . carvedilol  3.125 mg Oral BID WC  . clopidogrel  75 mg Oral Q breakfast  . docusate sodium  100 mg Oral BID  . hydrALAZINE  10 mg Oral TID  . isosorbide mononitrate  15 mg Oral Daily  . polyethylene glycol  17 g Oral BID  . potassium chloride  10 mEq Oral Daily  . pravastatin  80 mg Oral QAC supper  . sodium chloride  3 mL Intravenous Q12H  . sodium chloride  3 mL Intravenous Q12H    OBJECTIVE  Filed Vitals:   08/09/14 2040 08/10/14 0500 08/10/14 0529 08/10/14 0842  BP: 111/55  93/47 98/41  Pulse: 70  67 72  Temp: 98.3 F (36.8 C) 98 F (36.7 C) 98.6 F (37 C)   TempSrc: Oral  Oral   Resp: 18  18   Height:      Weight:   129 lb 4.8 oz (58.65 kg)   SpO2: 97%  95%     Intake/Output Summary (Last 24 hours) at 08/10/14 0949 Last data filed at 08/10/14 0905  Gross per 24 hour  Intake   1600 ml  Output    125 ml  Net   1475 ml   Filed Weights   08/08/14 0531 08/09/14 0648 08/10/14 0529  Weight: 122 lb 8 oz (55.566 kg) 127 lb 11.2 oz (57.924 kg) 129 lb 4.8 oz (58.65 kg)    PHYSICAL EXAM  General: Pleasant, NAD. Neuro: Alert and oriented X 3. Moves  all extremities spontaneously. Psych: Normal affect. HEENT:  Normal  Neck: Supple without bruits or JVD. Lungs:  Resp regular and unlabored, CTA. Heart: RRR no s3, s4, or murmurs. Abdomen: Soft, non-tender, non-distended, BS + x 4.   Extremities: No clubbing, cyanosis. DP/PT/Radials 2+ and equal bilaterally. Trace 0+ edema in LLE, but not in RLE  Accessory Clinical Findings  CBC  Recent Labs  08/09/14 0312 08/10/14 0356  WBC 5.7 5.1  HGB 8.6* 8.3*  HCT 27.3* 26.5*  MCV 80.1 79.3  PLT 405* 737   Basic Metabolic Panel  Recent Labs  08/08/14 0409 08/09/14 0312  NA 135 131*  K 3.7 4.1  CL 99* 95*  CO2 28 26  GLUCOSE 96 168*  BUN 20 23*  CREATININE 2.02* 2.39*  CALCIUM 8.8* 8.6*    TELE NSR without significant ventricular ectopy or recurrent a-fib    ECG  No new EKG  Echocardiogram 08/04/2014  LV EF: 30%  ------------------------------------------------------------------- Indications:   CHF - 428.0.  ------------------------------------------------------------------- History:  PMH:  Coronary artery disease. Congestive heart failure. Aortic valve disease. Mitral valve disease. Chronic obstructive  pulmonary disease. PMH:  Myocardial infarction. Risk factors: Former tobacco use. Hypertension.  ------------------------------------------------------------------- Study Conclusions  - Left ventricle: The cavity size was moderately dilated. Wall thickness was normal. The estimated ejection fraction was 30%. Diffuse hypokinesis. Findings consistent with left ventricular diastolic dysfunction. Doppler parameters are consistent with high ventricular filling pressure. - Aortic valve: Leaflets thickened and calcified, Mild AS. Mild AI. - Mitral valve: Mildly thickened leaflets . There was severe regurgitation. - Left atrium: The atrium was severely dilated. - Right ventricle: The cavity size was mildly dilated. Systolic function was  mildly reduced. - Right atrium: The atrium was mildly dilated. - Tricuspid valve: There was mild-moderate regurgitation. - Pulmonary arteries: PA peak pressure: 35 mm Hg (S). - Pericardium, extracardiac: There was a left pleural effusion.    Radiology/Studies  Dg Chest 2 View  08/05/2014   CLINICAL DATA:  Pleural effusion, shortness of breath  EXAM: CHEST  2 VIEW  COMPARISON:  08/03/2014  FINDINGS: Moderate enlargement of the cardiac silhouette noted. Hyperinflation and coarsened interstitial opacities suggest emphysema with possible superimposed interstitial edema. Bilateral hilar prominence with tapering may suggest pulmonary arterial hypertension. Small pleural effusions. Bones are subjectively osteopenic. Patchy bibasilar opacities are most likely compressive atelectasis. Atheromatous aortic calcification noted.  IMPRESSION: Probable emphysematous change with cardiomegaly and possible superimposed interstitial edema.  Small pleural effusions.   Electronically Signed   By: Conchita Paris M.D.   On: 08/05/2014 09:55   Dg Chest 2 View  08/03/2014   CLINICAL DATA:  Former smoker with current history of oxygen dependent COPD, presenting with left-sided chest pain and paresthesias. Prior CABG.  EXAM: CHEST  2 VIEW  COMPARISON:  06/14/2014 and earlier, including CT chest 06/05/2014.  FINDINGS: Prior sternotomy CABG. Cardiac silhouette markedly enlarged but stable. Prominent right pulmonary artery accounting for the opacity in the right hilum as identified on the prior CT. Large hiatal hernia, unchanged. Mild diffuse interstitial pulmonary edema with Kerley B-lines, new since the most recent examination 06/14/2014. Interval increase in size of a now moderately large right pleural effusion. Stable small left pleural effusion. New consolidation in the anterior right lower lobe. Osseous demineralization, thoracolumbar scoliosis, and exaggeration of the usual thoracic kyphosis.  IMPRESSION: 1. Mild CHF, with  stable marked cardiomegaly and mild diffuse interstitial pulmonary edema. 2. Moderately large right pleural effusion, increased since 06/14/2014. Stable small left pleural effusion. (The patient has chronic bilateral effusions. ) 3. New consolidation in the anterior right lower lobe, passive atelectasis versus pneumonia. 4. Stable large hiatal hernia.   Electronically Signed   By: Evangeline Dakin M.D.   On: 08/03/2014 20:17    ASSESSMENT AND PLAN  81 y/o female with a history of CAD s/p CABG (09/2008, LIMA to LAD and free RIMA Y graft to OM off of LIMA with ortic root replacement) ischemic cardiomyopathy (30% to 35%, 03/8464), chronic systolic CHF, HTN, HLD, tobacco abuse, COPD on 2L oxygen, CKD, PAD, and severe mitral regurgitation, readmitted for a/c CHF and chest pain. She has had multiple readmissions for acute HF secondary to her severe mitral valve disease over the last several months.   1. Acute on Chronic Systolic CHF:   - EF 59% on echo 08/04/2014 and unchanged compared to prior.   - Cr continue to worsen, while weight continue to increase, suprisingly does not have acute HF symptom on exam. Only trace edema in LLE, expect Cr to pleateau soon. Will hold lasix for now.  2. CAD: h/o CABG in 2010.   -  cath 08/06/2014 patent DES to prox RCA, 70% prox to mid LCx, 50% prox to mid LAD, 90% OM1, 75% D1, EF 15-20%. Severe MR.   - per Dr. Tamala Julian, in the absence of anginal pattern, intervention on OM1 is questionable and potentially harmful.    3. Severe Mitral Valve Regurgitation:  - Per Dr. Tamala Julian, severe MR likely significant contribution from annular dilatation, however may have possible ischemic component and intrinsic mitral valve dx. Felt with significant deterioration of LV function, impact of mitral clip on symptoms may be minimal   - TEE cancelled as pt went into new afib with RVR after leaving telemetry floor and arriving in Endo. Per MD, her paroxysms of atrial fib may also play a role with her  decompensating episodes. On this basis decided not to proceed with transesophageal echo.   4. Acute on chronic renal insufficiency: Cr continue to worsen. Likely related to hypotension and contrast nephropathy  5. Newly onset PAF with RVR: Went into PAF on 7/26 somewhere between leaving telemetry floor and arriving at Endo for TEE, TEE was cancelled as result  - CHA2DS2-Vasc score 6 (female, age, CAD, HF, HTN)  - placed on IV amio, converted to NSR on the same day  - per MD, will hold off on systemic anticoagulation as she is on ASA and plavix and has baseline anemia.   - her metoprolol was changed to Coreg during this admission for LV dysfunction, if has recurrence of PAF in the future, may need to change back to metoprolol  - continue amiodarone 200mg  BID, likely can transition to 200mg  daily in 1 month  6. Anemia: hgb trending down  Ruben Im Pager: [phone]  Patient seen and examined. I agree with the assessment and plan as detailed above. See also my additional thoughts below.   Unfortunately we have not been able to stabilize this patient enough for discharge. She came in with CHF. We know she has severe mitral regurgitation. It was decided to assess her mitral valve further for the possibility that she might be a candidate for more advanced treatment of her mitral valve. Cardiac catheterization was done to be sure that her coronaries were stable. Her coronary status was stable. However it seemed that her LV function was even worse than before. We considered transesophageal echocardiography, but the day she went for the procedure she developed rapid atrial fibrillation. At that point it was felt that this might be playing a role with her intermittent heart failure. She was treated with IV amiodarone and then converted to oral amiodarone. The plan was to continue this over time. At that point we were close to discharge home. Unfortunately she has had worsening renal function. It  has been felt that some of this may be related to the catheterization dye. It did not appear that it was from CHF. An additional problem is that her hemoglobin continues to drop with no obvious source of bleeding. We are continuing to try to assess and treat these issues. It is of note that I had already decided not to add an anticoagulated to her dual antiplatelets therapy. She has had PCI in the past 6 months. I felt that dual antiplatelets therapy itself was very important. I felt that she was too high risk to consider anticoagulation. Fortunately this was a good decision has she has ongoing decreasing hemoglobin and we do not have to blame it on an anticoagulatant  Dola Argyle, MD, [facility] 08/10/2014 11:13 AM

## 2014-08-10 NOTE — Progress Notes (Signed)
[facility] PROGRESS NOTE  [name] VZD:[id number] DOB: Apr 02, 1927 DOA: 08/03/2014 PCP: Leonard Downing, MD  Assessment/Plan: 1. Acute on chronic combined systolic and diastolic congestive heart failure. -Patient presenting with clinical signs and symptoms consistent with acute CHF -Her last transthoracic echocardiogram was performed on 04/20/2014 that revealed an EF of 30-35% with diffuse hypokinesis as well as grade 2 diastolic dysfunction. -She was seen and evaluated by cardiology feeling that severe MR contributing to CHF, along with PAF. -Cr up and plan is to hold lasix and follow trend. Volume is stable currently and w/o signs of fluid overload.  -Cardiology recommended to use coreg  2.  Coronary artery disease. -Patient had reported having chest pain overnight. Troponins negative. -She had a cardiac catheterizations done on 06/04/2014 and 05/30/2014 showing 85% in-stent restenosis in the proximal RCA, proximal circumflex to mid circumflex 70% stenosis, first marginal lesion 80% stenosis, first on the lesion 90% stenosis in proximal LAD to mid LAD lesion 50% stenosis. She had stenting with drug-eluting stent on 06/04/2014, with plans for staged PCI given chronic kidney disease.  -Cardiology felt that circumflex and first marginal stenosis could be causing ischemic MR -Patient undergoing cardiac catheterization on 08/06/2014 showing widely patent drug-eluting stent to the proximal RCA, proximal circumflex to mid circumflex lesion 70% stenosis, proximal LAD 2 mid LAD lesion 50% stenosis, first marginal containing ostial 90% stenosis, first diagonal branches containing 75% stenosis.  -Patient found having EF of 15-20%.  -Cardiology recommending continuing medical management -continue b-blocker, ASA and Plavix. No CP reported by patient   3.  Atrial fibrillation with rapid ventricular response -On 08/07/2014 she was found to be in A. fib with RVR having ventricular rates  in the 150s -She was started on an amiodarone drip and hep drip -per cardiology will d/c heparin and continue amiodarone alone; plan is to transitioned it to PO -patient sinus rhythm now -ChadsVasc score 6  4.  Mitral valve regurgitation -Transthoracic echocardiogram from 04/20/2014 showing severe regurgitation -Possibility of mitral clip had been discussed, unclear benefits, TEE had been planned for this admission, but patient experienced A. Fib with RVR and has now been cancelled -will continue controlling volume and arrhyhtmia -will follow cardiology rec's  5. Hypertension -Continue Imdur dose to 15 mg PO daily and coreg 3.125 mg PO BID -watch VS and try to avoid hypotension   6.  Dyslipidemia -Continue pravastatin 80 mg by mouth daily  7. Acute on chronic kidney dysfunction: stage 3-4 at baseline -most likely multifactorial: contrast, hypotension and diuresis -will follow trend -continue holding diuretics for now  8. chronic anemia: -most likely associated with CKD -Hgb 9.1 -no signs of overt bleeding -will monitor   Code Status: Full code Family Communication: Spoke with her daughter was present at bedside Disposition Plan: continue medical therapy; remains inpatient. TEE postponed and without plans to do it this time    Consultants:  Cardiology  Procedures:  Transthoracic echocardiogram impression: Left ventricle: The cavity size was moderately dilated. Wall thickness was normal. The estimated ejection fraction was 30%. Diffuse hypokinesis. Findings consistent with left ventricular diastolic dysfunction. Doppler parameters are consistent with high ventricular filling pressure. Mildly thickened leaflets . There was severe regurgitation.  HPI/Subjective: Patient denies CP, SOB, nausea/vomiting, abd pain or any other complaints. No signs of fluid overload on physical exam.  Objective: Filed Vitals:   08/10/14 1400  BP: 93/61  Pulse: 71  Temp:  98.3 F (36.8 C)  Resp: 18    Intake/Output Summary (Last 24  hours) at 08/10/14 1806 Last data filed at 08/10/14 1739  Gross per 24 hour  Intake    990 ml  Output    100 ml  Net    890 ml   Filed Weights   08/08/14 0531 08/09/14 0648 08/10/14 0529  Weight: 55.566 kg (122 lb 8 oz) 57.924 kg (127 lb 11.2 oz) 58.65 kg (129 lb 4.8 oz)    Exam:   General:  Feeling better, no CP and endorses breathing is good and stable. Patient is expressing that she is ready to go home. No fever, nausea, vomiting or any other complaints. Unfortunately Cr is slightly worse today  Cardiovascular: positive SEM, no rubs or gallops; no LE edema or JVD on exam  Respiratory: good air movement, no crackles on exam; no wheezing or rhonchi appreciated   Abdomen: Soft, nontender, nondistended  Extremities:  resolution of edema, no cyanosis   Data Reviewed: Basic Metabolic Panel:  Recent Labs Lab 08/06/14 0335 08/06/14 1854 08/07/14 0345 08/08/14 0409 08/09/14 0312 08/10/14 0944  NA 140  --  135 135 131* 128*  K 3.6  --  3.7 3.7 4.1 4.4  CL 101  --  95* 99* 95* 93*  CO2 32  --  31 28 26 25   GLUCOSE 81  --  82 96 168* 108*  BUN 21*  --  17 20 23* 30*  CREATININE 1.60* 1.65* 1.65* 2.02* 2.39* 2.76*  CALCIUM 8.9  --  9.0 8.8* 8.6* 9.0   CBC:  Recent Labs Lab 08/07/14 0345 08/08/14 0409 08/09/14 0312 08/10/14 0356 08/10/14 1218  WBC 6.0 6.3 5.7 5.1 5.6  HGB 10.3* 9.2* 8.6* 8.3* 9.1*  HCT 33.3* 29.6* 27.3* 26.5* 29.0*  MCV 82.0 81.1 80.1 79.3 79.0  PLT 503* 411* 405* 384 429*   Cardiac Enzymes:  Recent Labs Lab 08/04/14 0008 08/04/14 0543 08/04/14 1120  TROPONINI 0.03 0.04* 0.04*   BNP (last 3 results)  Recent Labs  05/22/14 0409 05/29/14 1000 08/03/14 2021  BNP 801.8* 851.1* 2801.4*    Studies: Dg Chest 2 View  08/10/2014   CLINICAL DATA:  Congestive heart failure  EXAM: CHEST  2 VIEW  COMPARISON:  August 05, 2014  FINDINGS: There is cardiomegaly with mild pulmonary  venous hypertension. There are bilateral pleural effusions with mild mid and lower lung zone interstitial edema. No airspace consolidation. Patient is status post internal mammary bypass grafting. There is a fairly sizable hiatal hernia.  IMPRESSION: Evidence of persistent congestive heart failure. No airspace consolidation. Hiatal hernia.   Electronically Signed   By: Lowella Grip III M.D.   On: 08/10/2014 12:02    Scheduled Meds: . amiodarone  200 mg Oral BID  . aspirin EC  81 mg Oral Daily  . carvedilol  3.125 mg Oral BID WC  . clopidogrel  75 mg Oral Q breakfast  . docusate sodium  100 mg Oral BID  . hydrALAZINE  10 mg Oral TID  . isosorbide mononitrate  15 mg Oral Daily  . polyethylene glycol  17 g Oral BID  . potassium chloride  10 mEq Oral Daily  . pravastatin  80 mg Oral QAC supper  . sodium chloride  3 mL Intravenous Q12H  . sodium chloride  3 mL Intravenous Q12H   Continuous Infusions: . sodium chloride 20 mL (08/07/14 0855)    Active Problems:   Hyperlipidemia   Essential hypertension   CAD-native   Chronic combined systolic and diastolic heart failure, NYHA class 2  COPD (chronic obstructive pulmonary disease)   CKD (chronic kidney disease), stage IV   Acute systolic congestive heart failure   Malnutrition of moderate degree   Acute systolic CHF (congestive heart failure), NYHA class 4   Severe mitral regurgitation   Atrial fibrillation with RVR   Paroxysmal atrial fibrillation   CHF exacerbation   Time spent: 35 min   Barton Dubois  [facility] Pager [phone]. If 7PM-7AM, please contact night-coverage at [email or web address], password [facility] 08/10/2014, 6:06 PM  LOS: 7 days

## 2014-08-11 DIAGNOSIS — I4891 Unspecified atrial fibrillation: Secondary | ICD-10-CM

## 2014-08-11 DIAGNOSIS — I25738 Atherosclerosis of nonautologous biological coronary artery bypass graft(s) with other forms of angina pectoris: Secondary | ICD-10-CM

## 2014-08-11 DIAGNOSIS — D638 Anemia in other chronic diseases classified elsewhere: Secondary | ICD-10-CM

## 2014-08-11 DIAGNOSIS — N179 Acute kidney failure, unspecified: Secondary | ICD-10-CM | POA: Insufficient documentation

## 2014-08-11 DIAGNOSIS — N184 Chronic kidney disease, stage 4 (severe): Secondary | ICD-10-CM

## 2014-08-11 DIAGNOSIS — I10 Essential (primary) hypertension: Secondary | ICD-10-CM

## 2014-08-11 DIAGNOSIS — R079 Chest pain, unspecified: Secondary | ICD-10-CM

## 2014-08-11 DIAGNOSIS — I5023 Acute on chronic systolic (congestive) heart failure: Secondary | ICD-10-CM

## 2014-08-11 LAB — URINALYSIS, ROUTINE W REFLEX MICROSCOPIC
BILIRUBIN URINE: NEGATIVE
Glucose, UA: NEGATIVE mg/dL
HGB URINE DIPSTICK: NEGATIVE
KETONES UR: NEGATIVE mg/dL
Nitrite: NEGATIVE
Protein, ur: NEGATIVE mg/dL
Specific Gravity, Urine: 1.018 (ref 1.005–1.030)
Urobilinogen, UA: 0.2 mg/dL (ref 0.0–1.0)
pH: 5 (ref 5.0–8.0)

## 2014-08-11 LAB — BASIC METABOLIC PANEL
ANION GAP: 8 (ref 5–15)
BUN: 33 mg/dL — AB (ref 6–20)
CO2: 25 mmol/L (ref 22–32)
CREATININE: 2.89 mg/dL — AB (ref 0.44–1.00)
Calcium: 8.7 mg/dL — ABNORMAL LOW (ref 8.9–10.3)
Chloride: 95 mmol/L — ABNORMAL LOW (ref 101–111)
GFR, EST AFRICAN AMERICAN: 16 mL/min — AB (ref >60)
GFR, EST NON AFRICAN AMERICAN: 14 mL/min — AB (ref >60)
Glucose, Bld: 82 mg/dL (ref 65–99)
POTASSIUM: 4.5 mmol/L (ref 3.5–5.1)
Sodium: 128 mmol/L — ABNORMAL LOW (ref 135–145)

## 2014-08-11 LAB — SODIUM, URINE, RANDOM: Sodium, Ur: <10 mmol/L

## 2014-08-11 LAB — URINE MICROSCOPIC-ADD ON

## 2014-08-11 LAB — CREATININE, URINE, RANDOM: Creatinine, Urine: 144.41 mg/dL

## 2014-08-11 NOTE — Progress Notes (Signed)
[facility] PROGRESS NOTE  DUSTI TETRO ERX:[id number] DOB: [birth year] DOA: 08/03/2014 PCP: Leonard Downing, MD  Assessment/Plan: 1. Acute on chronic combined systolic and diastolic congestive heart failure. -Patient presenting with clinical signs and symptoms consistent with acute CHF -Her last transthoracic echocardiogram was performed on 04/20/2014 that revealed an EF of 30-35% with diffuse hypokinesis as well as grade 2 diastolic dysfunction. -She was seen and evaluated by cardiology feeling that severe MR contributing to CHF, along with PAF. -Cr up and plan is to hold lasix and follow trend. Volume is stable currently and w/o signs of fluid overload.  -Cardiology recommended to use coreg  2.  Coronary artery disease. -Patient had reported having chest pain overnight. Troponins negative. -She had a cardiac catheterizations done on 06/04/2014 and 05/30/2014 showing 85% in-stent restenosis in the proximal RCA, proximal circumflex to mid circumflex 70% stenosis, first marginal lesion 80% stenosis, first on the lesion 90% stenosis in proximal LAD to mid LAD lesion 50% stenosis. She had stenting with drug-eluting stent on 06/04/2014, with plans for staged PCI given chronic kidney disease.  -Cardiology felt that circumflex and first marginal stenosis could be causing ischemic MR -Patient undergoing cardiac catheterization on 08/06/2014 showing widely patent drug-eluting stent to the proximal RCA, proximal circumflex to mid circumflex lesion 70% stenosis, proximal LAD 2 mid LAD lesion 50% stenosis, first marginal containing ostial 90% stenosis, first diagonal branches containing 75% stenosis.  -Patient found having EF of 15-20%.  -Cardiology recommending continuing medical management -continue b-blocker, ASA and Plavix. No CP reported by patient   3.  Atrial fibrillation with rapid ventricular response -On 08/07/2014 she was found to be in A. fib with RVR having ventricular rates  in the 150s -She was started on an amiodarone drip and hep drip -per cardiology will d/c heparin and continue amiodarone alone; plan is to transitioned it to PO -patient sinus rhythm now -ChadsVasc score 6  4.  Mitral valve regurgitation -Transthoracic echocardiogram from 04/20/2014 showing severe regurgitation -Possibility of mitral clip had been discussed, unclear benefits, TEE had been planned for this admission, but patient experienced A. Fib with RVR and has now been cancelled -will continue controlling volume and arrhyhtmia -will follow cardiology rec's  5. Hypertension -Continue Imdur dose to 15 mg PO daily and coreg 3.125 mg PO BID -watch VS and try to avoid hypotension   6.  Dyslipidemia -Continue pravastatin 80 mg by mouth daily  7. Acute on chronic kidney dysfunction: stage 3-4 at baseline -most likely multifactorial: contrast, hypotension and diuresis -will follow trend -continue holding diuretics for now -will consult renal service and follow their rec's  8. chronic anemia: -most likely associated with CKD -Hgb 9.1 -no signs of overt bleeding -will monitor   Code Status: Full code Family Communication: Spoke with her daughter was present at bedside Disposition Plan: continue medical therapy; remains inpatient. Renal function has worsen and preventing her to be discharge.    Consultants:  Cardiology  Renal service.  Procedures:  Transthoracic echocardiogram impression: Left ventricle: The cavity size was moderately dilated. Wall thickness was normal. The estimated ejection fraction was 30%. Diffuse hypokinesis. Findings consistent with left ventricular diastolic dysfunction. Doppler parameters are consistent with high ventricular filling pressure. Mildly thickened leaflets . There was severe regurgitation.  HPI/Subjective: Patient denies CP, SOB, nausea/vomiting, abd pain or any other complaints. No fver, no dysuria. No signs of fluid  overload.  Objective: Filed Vitals:   08/11/14 1800  BP: 102/45  Pulse: 77  Temp:  Resp: 20    Intake/Output Summary (Last 24 hours) at 08/11/14 2158 Last data filed at 08/11/14 1800  Gross per 24 hour  Intake    720 ml  Output    500 ml  Net    220 ml   Filed Weights   08/09/14 0648 08/10/14 0529 08/11/14 0613  Weight: 57.924 kg (127 lb 11.2 oz) 58.65 kg (129 lb 4.8 oz) 58.695 kg (129 lb 6.4 oz)    Exam:   General:  Feeling better, no CP and endorses breathing is good and stable. Patient kidney function has continue deteriorating despite holding diuretics. No dysuria and no fever.   Cardiovascular: positive SEM, no rubs or gallops; no LE edema or JVD on exam  Respiratory: good air movement, no crackles on exam; no wheezing or rhonchi appreciated   Abdomen: Soft, nontender, nondistended  Extremities:  resolution of edema, no cyanosis   Data Reviewed: Basic Metabolic Panel:  Recent Labs Lab 08/07/14 0345 08/08/14 0409 08/09/14 0312 08/10/14 0944 08/11/14 0340  NA 135 135 131* 128* 128*  K 3.7 3.7 4.1 4.4 4.5  CL 95* 99* 95* 93* 95*  CO2 31 28 26 25 25   GLUCOSE 82 96 168* 108* 82  BUN 17 20 23* 30* 33*  CREATININE 1.65* 2.02* 2.39* 2.76* 2.89*  CALCIUM 9.0 8.8* 8.6* 9.0 8.7*   CBC:  Recent Labs Lab 08/07/14 0345 08/08/14 0409 08/09/14 0312 08/10/14 0356 08/10/14 1218  WBC 6.0 6.3 5.7 5.1 5.6  HGB 10.3* 9.2* 8.6* 8.3* 9.1*  HCT 33.3* 29.6* 27.3* 26.5* 29.0*  MCV 82.0 81.1 80.1 79.3 79.0  PLT 503* 411* 405* 384 429*   BNP (last 3 results)  Recent Labs  05/22/14 0409 05/29/14 1000 08/03/14 2021  BNP 801.8* 851.1* 2801.4*    Studies: Dg Chest 2 View  08/10/2014   CLINICAL DATA:  Congestive heart failure  EXAM: CHEST  2 VIEW  COMPARISON:  August 05, 2014  FINDINGS: There is cardiomegaly with mild pulmonary venous hypertension. There are bilateral pleural effusions with mild mid and lower lung zone interstitial edema. No airspace consolidation.  Patient is status post internal mammary bypass grafting. There is a fairly sizable hiatal hernia.  IMPRESSION: Evidence of persistent congestive heart failure. No airspace consolidation. Hiatal hernia.   Electronically Signed   By: Lowella Grip III M.D.   On: 08/10/2014 12:02    Scheduled Meds: . amiodarone  200 mg Oral BID  . aspirin EC  81 mg Oral Daily  . clopidogrel  75 mg Oral Q breakfast  . docusate sodium  100 mg Oral BID  . polyethylene glycol  17 g Oral BID  . potassium chloride  10 mEq Oral Daily  . pravastatin  80 mg Oral QAC supper  . sodium chloride  3 mL Intravenous Q12H  . sodium chloride  3 mL Intravenous Q12H   Continuous Infusions:    Active Problems:   Hyperlipidemia   Essential hypertension   CAD-native   Chronic combined systolic and diastolic heart failure, NYHA class 2   COPD (chronic obstructive pulmonary disease)   CKD (chronic kidney disease), stage IV   Acute systolic congestive heart failure   Malnutrition of moderate degree   Acute systolic CHF (congestive heart failure), NYHA class 4   Severe mitral regurgitation   Atrial fibrillation with RVR   Paroxysmal atrial fibrillation   CHF exacerbation   Acute on chronic systolic heart failure   Anemia of chronic disease   Time spent: 35  min   Barton Dubois  [facility] Pager [phone]. If 7PM-7AM, please contact night-coverage at [email or web address], password [facility] 08/11/2014, 9:58 PM  LOS: 8 days

## 2014-08-11 NOTE — Care Management Note (Signed)
Case Management Note  Patient Details  Name: BARBI KUMAGAI MRN: [id number] Date of Birth: May 08, 1927  Subjective/Objective:        CHF            Action/Plan: Lives alone  Expected Discharge Date:      08/13/2014           Expected Discharge Plan:  [place]  In-House Referral:     Discharge planning Services  CM Consult  Post Acute Care Choice:  Home Health Choice offered to:  Patient  DME Arranged:    DME Agency:     HH Arranged:    [place] Agency:     Status of Service:  In process, will continue to follow  Medicare Important Message Given:  Yes-third notification given Date Medicare IM Given:    Medicare IM give by:    Date Additional Medicare IM Given:    Additional Medicare Important Message give by:     If discussed at [place] of Stay Meetings, dates discussed:    Additional Comments: 08/10/2014 1630 NCM spoke to pt and states she had [facility] in the past for [facility]. She has oxygen at home. Instructed pt to have family bring a portable tank to use at time of dc. She also has a RW. States she is independent at home and has family that checks on her on a regular basis. Waiting final recommendations for home. Pt may benefit from a [facility] RN at time of dc.  Erenest Rasher, RN 08/11/2014, 10:20 AM

## 2014-08-11 NOTE — Consult Note (Signed)
Renal Service Consult Note [place] 08/11/2014 Sol Blazing Requesting Physician:  Dr Dyann Kief  Reason for Consult:  Acute on CRF HPI: The patient is a 81 y.o. year-old with hx of CAD, ICM, chronic systolic HF, HTN, tobacco, ASCVD, COPD and CKD stage 4 with baseline creatinine of 1.6- 1.8.  Patient was admitted 3 times in April- May this year for decompensated CHF, then again in May for unstable angina.  She presented this time on 7/22 with SOB/ orthopnea. She has been diuresed and breathing is back to baseline. Creatinine has gone up from 1.65 to 2.89 over the last several days.  BP's have been relatively low in the 90's to low 100's the last few days. Lasix/ diuretics were stopped a few days ago.   Patient has no complaints, no SOB, cough, abd pain, no n/v/d.   Medications > coreg , asa, pacerone, plavix, colace, lasix, hydralazine, imdur, MTP, KCl, pravachol,   No IV contrast, nsaids' or acei/arb's.   Past Medical History  Past Medical History  Diagnosis Date  . CAD (coronary artery disease)     a. 07/2008 Inf STEMI->RCA BMS;  b. 09/2008 CABGx2: LIMA->LAD, RIMA->OM as Y from LIMA;  b. 06/2011 MV EF 31%, bsaal to dist ant infarct, inf/inflat infarct with mild to mod peri-infarct ischemia [facility]).  . Ischemic cardiomyopathy     a. 06/2011 Echo: EF 40%, inf/inflat AK and lat HK, aortic sclerosis w/ mild AI, Mild to mod MR (prev mod-sev), Gr 1 DD, mild TR, RVSP 82mmHg  . Systolic CHF, chronic     a. 06/2011 EF 40%  . Hypertension   . Hyperlipidemia   . Hiatal hernia     large  . Tobacco abuse   . Cerebrovascular disease   . COPD (chronic obstructive pulmonary disease)     presumed   . Arterial disease     peripheral arterial disease with claudication - bilateral SFA disease with ABI's 0.4-0.5 bilaterally   . Mitral regurgitation     a. prev Mod-Sev, read as mild to mod by echo 06/2011 [facility]).  . CKD (chronic kidney  disease), stage IV     born with 1 kidney  . Thyroid nodule 10/2012    left dominant nodule cytology:non-neoplastic goiter.   . Cataracts, both eyes     not surgical  . Personal history of colonic polyps - adenomas 05/09/2013  . Anemia   . Systolic HF (heart failure)   . Hypertensive renal disease   . Hypertensive heart disease with congestive heart failure   . Aorta aneurysm   . LVH (left ventricular hypertrophy)   . Arteriosclerotic vascular disease   . Bronchiectasis   . Atherosclerosis of abdominal aorta   . Heart murmur   . Shortness of breath dyspnea   . MI (myocardial infarction) 2010   Past Surgical History  Past Surgical History  Procedure Laterality Date  . Laprotomy for bowel obstruction    . Coronary artery bypass graft  09/2008    aortic thoraic anuerysm resecton 2010.   Marland Kitchen Coronary stent placement  07/2008    BMS to RCA  . Flexible sigmoidoscopy N/A 05/09/2013    Procedure: FLEXIBLE SIGMOIDOSCOPY;  Surgeon: [name], MD;  Location: [place];  Service: Endoscopy;  Laterality: N/A;  with possible hemorrhoid banding use gastrosope unsdeated  . Appendectomy    . Oophorectomy      one removed in 70's  . Hemorrhoid surgery    .  Colonoscopy N/A 05/12/2013    Procedure: COLONOSCOPY;  Surgeon: [name], MD;  Location: [facility];  Service: Endoscopy;  Laterality: N/A;  . Cardiac catheterization N/A 05/30/2014    Procedure: Right/Left Heart Cath and Coronary/Graft Angiography;  Surgeon: Peter M Martinique, MD;  Location: [place] CV LAB;  Service: Cardiovascular;  Laterality: N/A;  . Cardiac catheterization N/A 06/04/2014    Procedure: Coronary Stent Intervention;  Surgeon: Belva Crome, MD;  Location: [place] CV LAB;  Service: Cardiovascular;  Laterality: N/A;  . Cardiac catheterization N/A 08/06/2014    Procedure: Left Heart Cath and Coronary Angiography;  Surgeon: Belva Crome, MD;  Location: [place] CV LAB;  Service: Cardiovascular;  Laterality:  N/A;   Family History  Family History  Problem Relation Age of Onset  . Coronary artery disease Mother     extensive family history; several other family members.  . Heart disease Mother     CHF  . Coronary artery disease Brother   . Leukemia Sister   . Leukemia Sister   . Leukemia Father   . Colon cancer Neg Hx   . Pancreatic cancer Neg Hx   . Stomach cancer Neg Hx   . Rectal cancer Neg Hx   . Kidney cancer Brother   . Heart failure Mother   . Heart attack Sister   . Stroke Neg Hx    Social History  reports that she quit smoking about 6 years ago. Her smoking use included Cigarettes. She has a 60 pack-year smoking history. She has never used smokeless tobacco. She reports that she does not drink alcohol or use illicit drugs. Allergies No Known Allergies Home medications Prior to Admission medications   Medication Sig Start Date End Date Taking? Authorizing Provider  acetaminophen (TYLENOL) 325 MG tablet Take 650 mg by mouth daily as needed (pain).   Yes Historical Provider, MD  albuterol (PROVENTIL HFA;VENTOLIN HFA) 108 (90 BASE) MCG/ACT inhaler Inhale 2 puffs into the lungs every 4 (four) hours as needed for wheezing or shortness of breath.    Yes Historical Provider, MD  aspirin EC 81 MG tablet Take 81 mg by mouth daily.   Yes Historical Provider, MD  clopidogrel (PLAVIX) 75 MG tablet Take 1 tablet (75 mg total) by mouth daily with breakfast. 07/05/14  Yes Burnell Blanks, MD  Coenzyme Q-10 100 MG capsule Take 100 mg by mouth daily at 2 PM daily at 2 PM.    Yes Historical Provider, MD  furosemide (LASIX) 40 MG tablet Take 1 tablet (40 mg total) by mouth 2 (two) times daily. 06/05/14  Yes Debbe Odea, MD  hydrALAZINE (APRESOLINE) 10 MG tablet Take 1 tablet (10 mg total) by mouth 3 (three) times daily. 07/05/14  Yes Burnell Blanks, MD  isosorbide mononitrate (IMDUR) 30 MG 24 hr tablet Take 1 tablet (30 mg total) by mouth daily. 06/05/14  Yes Debbe Odea, MD   metoprolol tartrate (LOPRESSOR) 25 MG tablet Take 1 tablet (25 mg total) by mouth daily. Patient taking differently: Take 25 mg by mouth 2 (two) times daily.  06/05/14  Yes Debbe Odea, MD  nitroGLYCERIN (NITROSTAT) 0.4 MG SL tablet Place 1 tablet (0.4 mg total) under the tongue every 5 (five) minutes as needed. Patient taking differently: Place 0.4 mg under the tongue every 5 (five) minutes as needed for chest pain.  04/13/14  Yes Burnell Blanks, MD  potassium chloride (K-DUR,KLOR-CON) 10 MEQ tablet Take 1 tablet (10 mEq total) by mouth 2 (  two) times daily. Patient taking differently: Take 10 mEq by mouth daily.  06/05/14  Yes Debbe Odea, MD  pravastatin (PRAVACHOL) 80 MG tablet TAKE ONE TABLET BY MOUTH ONCE DAILY IN THE EVENING Patient taking differently: Take 80 mg by mouth daily before supper.  06/26/14  Yes Burnell Blanks, MD   Liver Function Tests No results for input(s): AST, ALT, ALKPHOS, BILITOT, PROT, ALBUMIN in the last 168 hours. No results for input(s): LIPASE, AMYLASE in the last 168 hours. CBC  Recent Labs Lab 08/09/14 0312 08/10/14 0356 08/10/14 1218  WBC 5.7 5.1 5.6  HGB 8.6* 8.3* 9.1*  HCT 27.3* 26.5* 29.0*  MCV 80.1 79.3 79.0  PLT 405* 384 696*   Basic Metabolic Panel  Recent Labs Lab 08/05/14 0331 08/06/14 0335 08/06/14 1854 08/07/14 0345 08/08/14 0409 08/09/14 0312 08/10/14 0944 08/11/14 0340  NA 137 140  --  135 135 131* 128* 128*  K 3.8 3.6  --  3.7 3.7 4.1 4.4 4.5  CL 98* 101  --  95* 99* 95* 93* 95*  CO2 30 32  --  31 28 26 25 25   GLUCOSE 81 81  --  82 96 168* 108* 82  BUN 28* 21*  --  17 20 23* 30* 33*  CREATININE 1.69* 1.60* 1.65* 1.65* 2.02* 2.39* 2.76* 2.89*  CALCIUM 8.4* 8.9  --  9.0 8.8* 8.6* 9.0 8.7*    Filed Vitals:   08/10/14 2129 08/11/14 0613 08/11/14 0904 08/11/14 1300  BP: 109/56 109/54 115/48 94/51  Pulse: 68 69 81 69  Temp: 98 F (36.7 C) 98 F (36.7 C)  98.7 F (37.1 C)  TempSrc: Oral Oral  Oral  Resp: 18  18 18 18   Height:      Weight:  58.695 kg (129 lb 6.4 oz)    SpO2: 94% 92% 97% 99%   Exam Elderly alert female, up in chair, no distress No rash, cyanosis or gangrene Sclera anicteric, throat clear Flat neck veins, no jvd Chest clear throughout, no wheezing or rales RRR no RG Abd soft ntnd no mass, no ascites or hsm Neuro is Ox 3, nonfocal Ext no LE or UE edema  UA pending ECHO 7/23 LV 30%, RV mildly reduced fxn CXR 7/24 +CHF  Assessment: 1. Acute on CRF - due to relative hypotension most likely.  Check UA, hold antihypertensives and nitrates. Let BP come up.  2. Hyponatremia, mild, may be hypovolemic 3. CHF exacerbation, s/p diuresis - recurrent issue. Hx of chronic ICM.  4.  CAD hx CABG 5.  Hx severe MR 6.  New afib with RVR   Plan- stopping hydralazine, coreg and imdur. Start IVF"s at 50 cc/ hr. UA, UNa, repeat labs in am.   Kelly Splinter MD (pgr) [phone]    ([phone] 08/11/2014, 2:47 PM

## 2014-08-11 NOTE — Progress Notes (Signed)
SUBJECTIVE: Feeling well this morning. Denies chest pain, shortness of breath, and leg swelling. Had back pain last night, sitting up in chair today.     Intake/Output Summary (Last 24 hours) at 08/11/14 0925 Last data filed at 08/11/14 4098  Gross per 24 hour  Intake    350 ml  Output    451 ml  Net   -101 ml    Current Facility-Administered Medications  Medication Dose Route Frequency Provider Last Rate Last Dose  . 0.9 %  sodium chloride infusion  250 mL Intravenous PRN Belva Crome, MD      . 0.9 %  sodium chloride infusion   Intravenous Continuous Almyra Deforest, PA 20 mL/hr at 08/07/14 0855 20 mL at 08/07/14 0855  . acetaminophen (TYLENOL) tablet 650 mg  650 mg Oral Daily PRN Sid Falcon, MD   650 mg at 08/10/14 0959  . albuterol (PROVENTIL) (2.5 MG/3ML) 0.083% nebulizer solution 2.5 mg  2.5 mg Inhalation Q4H PRN Sid Falcon, MD      . amiodarone (PACERONE) tablet 200 mg  200 mg Oral BID Carlena Bjornstad, MD   200 mg at 08/10/14 2249  . aspirin EC tablet 81 mg  81 mg Oral Daily Sid Falcon, MD   81 mg at 08/10/14 1000  . carvedilol (COREG) tablet 3.125 mg  3.125 mg Oral BID WC Kelvin Cellar, MD   3.125 mg at 08/11/14 0756  . clopidogrel (PLAVIX) tablet 75 mg  75 mg Oral Q breakfast Sid Falcon, MD   75 mg at 08/11/14 0756  . docusate sodium (COLACE) capsule 100 mg  100 mg Oral BID Kelvin Cellar, MD   100 mg at 08/10/14 2249  . hydrALAZINE (APRESOLINE) tablet 10 mg  10 mg Oral TID Sid Falcon, MD   10 mg at 08/10/14 2249  . ipratropium-albuterol (DUONEB) 0.5-2.5 (3) MG/3ML nebulizer solution 3 mL  3 mL Nebulization Q6H PRN Sid Falcon, MD      . isosorbide mononitrate (IMDUR) 24 hr tablet 15 mg  15 mg Oral Daily Kelvin Cellar, MD   15 mg at 08/10/14 0959  . nitroGLYCERIN (NITROSTAT) SL tablet 0.4 mg  0.4 mg Sublingual Q5 min PRN Sid Falcon, MD      . ondansetron [facility]) injection 4 mg  4 mg Intravenous Q6H PRN Sid Falcon, MD      . polyethylene  glycol (MIRALAX / GLYCOLAX) packet 17 g  17 g Oral BID Barton Dubois, MD   17 g at 08/10/14 2249  . potassium chloride (K-DUR,KLOR-CON) CR tablet 10 mEq  10 mEq Oral Daily Sid Falcon, MD   10 mEq at 08/10/14 1000  . pravastatin (PRAVACHOL) tablet 80 mg  80 mg Oral QAC supper Sid Falcon, MD   80 mg at 08/10/14 1709  . sodium chloride 0.9 % injection 3 mL  3 mL Intravenous Q12H Sid Falcon, MD   3 mL at 08/10/14 2253  . sodium chloride 0.9 % injection 3 mL  3 mL Intravenous PRN Sid Falcon, MD      . sodium chloride 0.9 % injection 3 mL  3 mL Intravenous Q12H Belva Crome, MD   3 mL at 08/10/14 2253  . sodium chloride 0.9 % injection 3 mL  3 mL Intravenous PRN Belva Crome, MD        Filed Vitals:   08/10/14 1400 08/10/14 2129 08/11/14 1191 08/11/14 4782  BP: 93/61 109/56 109/54 115/48  Pulse: 71 68 69 81  Temp: 98.3 F (36.8 C) 98 F (36.7 C) 98 F (36.7 C)   TempSrc: Oral Oral Oral   Resp: 18 18 18 18   Height:      Weight:   129 lb 6.4 oz (58.695 kg)   SpO2: 98% 94% 92% 97%    PHYSICAL EXAM General: NAD HEENT: Normal. Neck: No JVD, no thyromegaly.  Lungs: Faint, intermittent bibasilar atelectasis but otherwise clear to auscultation bilaterally with normal respiratory effort. CV: Nondisplaced PMI.  Regular rate and rhythm, normal S1/S2, no S3/S4, no murmur.  No pretibial edema.   Abdomen: Soft, nontender, no distention.  Neurologic: Alert and oriented x 3.  Psych: Normal affect. Musculoskeletal: No gross deformities. Extremities: No clubbing or cyanosis.   TELEMETRY: Reviewed telemetry pt in sinus rhythm.  LABS: Basic Metabolic Panel:  Recent Labs  08/10/14 0944 08/11/14 0340  NA 128* 128*  K 4.4 4.5  CL 93* 95*  CO2 25 25  GLUCOSE 108* 82  BUN 30* 33*  CREATININE 2.76* 2.89*  CALCIUM 9.0 8.7*   Liver Function Tests: No results for input(s): AST, ALT, ALKPHOS, BILITOT, PROT, ALBUMIN in the last 72 hours. No results for input(s): LIPASE, AMYLASE  in the last 72 hours. CBC:  Recent Labs  08/10/14 0356 08/10/14 1218  WBC 5.1 5.6  HGB 8.3* 9.1*  HCT 26.5* 29.0*  MCV 79.3 79.0  PLT 384 429*   Cardiac Enzymes: No results for input(s): CKTOTAL, CKMB, CKMBINDEX, TROPONINI in the last 72 hours. BNP: Invalid input(s): POCBNP D-Dimer: No results for input(s): DDIMER in the last 72 hours. Hemoglobin A1C: No results for input(s): HGBA1C in the last 72 hours. Fasting Lipid Panel: No results for input(s): CHOL, HDL, LDLCALC, TRIG, CHOLHDL, LDLDIRECT in the last 72 hours. Thyroid Function Tests: No results for input(s): TSH, T4TOTAL, T3FREE, THYROIDAB in the last 72 hours.  Invalid input(s): FREET3 Anemia Panel: No results for input(s): VITAMINB12, FOLATE, FERRITIN, TIBC, IRON, RETICCTPCT in the last 72 hours.  RADIOLOGY: Dg Chest 2 View  08/10/2014   CLINICAL DATA:  Congestive heart failure  EXAM: CHEST  2 VIEW  COMPARISON:  August 05, 2014  FINDINGS: There is cardiomegaly with mild pulmonary venous hypertension. There are bilateral pleural effusions with mild mid and lower lung zone interstitial edema. No airspace consolidation. Patient is status post internal mammary bypass grafting. There is a fairly sizable hiatal hernia.  IMPRESSION: Evidence of persistent congestive heart failure. No airspace consolidation. Hiatal hernia.   Electronically Signed   By: Lowella Grip III M.D.   On: 08/10/2014 12:02   Dg Chest 2 View  08/05/2014   CLINICAL DATA:  Pleural effusion, shortness of breath  EXAM: CHEST  2 VIEW  COMPARISON:  08/03/2014  FINDINGS: Moderate enlargement of the cardiac silhouette noted. Hyperinflation and coarsened interstitial opacities suggest emphysema with possible superimposed interstitial edema. Bilateral hilar prominence with tapering may suggest pulmonary arterial hypertension. Small pleural effusions. Bones are subjectively osteopenic. Patchy bibasilar opacities are most likely compressive atelectasis. Atheromatous  aortic calcification noted.  IMPRESSION: Probable emphysematous change with cardiomegaly and possible superimposed interstitial edema.  Small pleural effusions.   Electronically Signed   By: Conchita Paris M.D.   On: 08/05/2014 09:55   Dg Chest 2 View  08/03/2014   CLINICAL DATA:  Former smoker with current history of oxygen dependent COPD, presenting with left-sided chest pain and paresthesias. Prior CABG.  EXAM: CHEST  2 VIEW  COMPARISON:  06/14/2014 and earlier, including CT chest 06/05/2014.  FINDINGS: Prior sternotomy CABG. Cardiac silhouette markedly enlarged but stable. Prominent right pulmonary artery accounting for the opacity in the right hilum as identified on the prior CT. Large hiatal hernia, unchanged. Mild diffuse interstitial pulmonary edema with Kerley B-lines, new since the most recent examination 06/14/2014. Interval increase in size of a now moderately large right pleural effusion. Stable small left pleural effusion. New consolidation in the anterior right lower lobe. Osseous demineralization, thoracolumbar scoliosis, and exaggeration of the usual thoracic kyphosis.  IMPRESSION: 1. Mild CHF, with stable marked cardiomegaly and mild diffuse interstitial pulmonary edema. 2. Moderately large right pleural effusion, increased since 06/14/2014. Stable small left pleural effusion. (The patient has chronic bilateral effusions. ) 3. New consolidation in the anterior right lower lobe, passive atelectasis versus pneumonia. 4. Stable large hiatal hernia.   Electronically Signed   By: Evangeline Dakin M.D.   On: 08/03/2014 20:17      ASSESSMENT AND PLAN: 81 y/o female with a history of CAD s/p CABG (09/2008, LIMA to LAD and free RIMA Y graft to OM off of LIMA with ortic root replacement) ischemic cardiomyopathy (30% to 35%, 0/7867), chronic systolic CHF, HTN, HLD, tobacco abuse, COPD on 2L oxygen, CKD, PAD, and severe mitral regurgitation, readmitted for a/c CHF and chest pain. She has had multiple  readmissions for acute HF secondary to her severe mitral valve disease over the last several months.   1. Acute on Chronic Systolic CHF:  - EF 54% on echo 08/04/2014 and unchanged compared to prior.  - Cr continue to worsen, weight stable from yesterday at 129 lbs. Continue to hold Lasix.  2. CAD: h/o CABG in 2010.  - cath 08/06/2014 patent DES to prox RCA, 70% prox to mid LCx, 50% prox to mid LAD, 90% OM1, 75% D1, EF 15-20%. Severe MR.  - per Dr. Tamala Julian, in the absence of anginal pattern, intervention on OM1 is questionable and potentially harmful.   3. Severe Mitral Valve Regurgitation: - Per Dr. Tamala Julian, severe MR likely significant contribution from annular dilatation, however may have possible ischemic component and intrinsic mitral valve dx. Felt with significant deterioration of LV function, impact of mitral clip on symptoms may be minimal  - TEE cancelled as pt went into new afib with RVR after leaving telemetry floor and arriving in Endo. Paroxysms of atrial fib may also play a role with her decompensating episodes. On this basis decided not to proceed with transesophageal echo.   4. Acute on chronic renal insufficiency: Cr continue to worsen. Likely related to hypotension and contrast nephropathy.   5. Newly onset PAF with RVR: Went into PAF on 7/26 somewhere between leaving telemetry floor and arriving at Endo for TEE, TEE was cancelled as result - CHA2DS2-Vasc score 6 (female, age, CAD, HF, HTN) - placed on IV amio, converted to NSR on the same day - No systemic anticoagulation as she is on ASA and plavix and has baseline anemia.  - her metoprolol was changed to Coreg during this admission for LV dysfunction, if has recurrence of PAF in the future, may need to change back to metoprolol - continue amiodarone 200mg  BID, likely can transition to 200mg   daily in 1 month  6. Anemia: No CBC today.    Kate Sable, M.D., F.A.C.C.

## 2014-08-12 DIAGNOSIS — N179 Acute kidney failure, unspecified: Secondary | ICD-10-CM

## 2014-08-12 LAB — RENAL FUNCTION PANEL
Albumin: 3 g/dL — ABNORMAL LOW (ref 3.5–5.0)
Anion gap: 10 (ref 5–15)
BUN: 35 mg/dL — ABNORMAL HIGH (ref 6–20)
CALCIUM: 8.9 mg/dL (ref 8.9–10.3)
CO2: 25 mmol/L (ref 22–32)
Chloride: 94 mmol/L — ABNORMAL LOW (ref 101–111)
Creatinine, Ser: 2.8 mg/dL — ABNORMAL HIGH (ref 0.44–1.00)
GFR calc non Af Amer: 14 mL/min — ABNORMAL LOW (ref >60)
GFR, EST AFRICAN AMERICAN: 17 mL/min — AB (ref >60)
Glucose, Bld: 103 mg/dL — ABNORMAL HIGH (ref 65–99)
Phosphorus: 4.7 mg/dL — ABNORMAL HIGH (ref 2.5–4.6)
Potassium: 4.6 mmol/L (ref 3.5–5.1)
SODIUM: 129 mmol/L — AB (ref 135–145)

## 2014-08-12 MED ORDER — SODIUM CHLORIDE 0.9 % IV SOLN
INTRAVENOUS | Status: AC
  Administered 2014-08-12: 14:00:00 via INTRAVENOUS

## 2014-08-12 NOTE — Progress Notes (Signed)
SUBJECTIVE: No complaints of chest pain, leg swelling, or shortness of breath.      Intake/Output Summary (Last 24 hours) at 08/12/14 0810 Last data filed at 08/12/14 0641  Gross per 24 hour  Intake    605 ml  Output    600 ml  Net      5 ml    Current Facility-Administered Medications  Medication Dose Route Frequency Provider Last Rate Last Dose  . 0.9 %  sodium chloride infusion  250 mL Intravenous PRN Belva Crome, MD      . acetaminophen (TYLENOL) tablet 650 mg  650 mg Oral Daily PRN Sid Falcon, MD   650 mg at 08/10/14 0959  . albuterol (PROVENTIL) (2.5 MG/3ML) 0.083% nebulizer solution 2.5 mg  2.5 mg Inhalation Q4H PRN Sid Falcon, MD      . amiodarone (PACERONE) tablet 200 mg  200 mg Oral BID Carlena Bjornstad, MD   200 mg at 08/11/14 2202  . aspirin EC tablet 81 mg  81 mg Oral Daily Sid Falcon, MD   81 mg at 08/11/14 0950  . clopidogrel (PLAVIX) tablet 75 mg  75 mg Oral Q breakfast Sid Falcon, MD   75 mg at 08/12/14 1740  . docusate sodium (COLACE) capsule 100 mg  100 mg Oral BID Kelvin Cellar, MD   100 mg at 08/11/14 2202  . ipratropium-albuterol (DUONEB) 0.5-2.5 (3) MG/3ML nebulizer solution 3 mL  3 mL Nebulization Q6H PRN Sid Falcon, MD      . nitroGLYCERIN (NITROSTAT) SL tablet 0.4 mg  0.4 mg Sublingual Q5 min PRN Sid Falcon, MD      . ondansetron [facility]) injection 4 mg  4 mg Intravenous Q6H PRN Sid Falcon, MD      . polyethylene glycol (MIRALAX / GLYCOLAX) packet 17 g  17 g Oral BID Barton Dubois, MD   17 g at 08/11/14 2202  . potassium chloride (K-DUR,KLOR-CON) CR tablet 10 mEq  10 mEq Oral Daily Sid Falcon, MD   10 mEq at 08/11/14 0950  . pravastatin (PRAVACHOL) tablet 80 mg  80 mg Oral QAC supper Sid Falcon, MD   80 mg at 08/11/14 1714  . sodium chloride 0.9 % injection 3 mL  3 mL Intravenous Q12H Sid Falcon, MD   3 mL at 08/11/14 1000  . sodium chloride 0.9 % injection 3 mL  3 mL Intravenous PRN Sid Falcon, MD        . sodium chloride 0.9 % injection 3 mL  3 mL Intravenous Q12H Belva Crome, MD   3 mL at 08/11/14 2203  . sodium chloride 0.9 % injection 3 mL  3 mL Intravenous PRN Belva Crome, MD        Filed Vitals:   08/11/14 1800 08/11/14 2201 08/12/14 0153 08/12/14 0640  BP: 102/45 99/57 100/48 112/56  Pulse: 77 66 64 88  Temp:  98.3 F (36.8 C) 98.2 F (36.8 C) 98 F (36.7 C)  TempSrc:  Oral Oral Oral  Resp: 20 20 20 20   Height:      Weight:    129 lb 14.4 oz (58.922 kg)  SpO2: 98% 93% 96% 93%    PHYSICAL EXAM General: NAD HEENT: Normal. Neck: No JVD, no thyromegaly.  Lungs: Faint, intermittent bibasilar atelectasis but otherwise clear to auscultation bilaterally with normal respiratory effort. CV: Regular rate and rhythm, normal S1/S2, no C1/K4, 2/6 systolic  murmur along left sternal border. No pretibial edema.  Abdomen: Soft, nontender, no distention.  Neurologic: Alert and oriented x 3.  Psych: Normal affect. Musculoskeletal: No gross deformities. Extremities: No clubbing or cyanosis.   TELEMETRY: Reviewed telemetry pt in sinus rhythm.  LABS: Basic Metabolic Panel:  Recent Labs  08/10/14 0944 08/11/14 0340  NA 128* 128*  K 4.4 4.5  CL 93* 95*  CO2 25 25  GLUCOSE 108* 82  BUN 30* 33*  CREATININE 2.76* 2.89*  CALCIUM 9.0 8.7*   Liver Function Tests: No results for input(s): AST, ALT, ALKPHOS, BILITOT, PROT, ALBUMIN in the last 72 hours. No results for input(s): LIPASE, AMYLASE in the last 72 hours. CBC:  Recent Labs  08/10/14 0356 08/10/14 1218  WBC 5.1 5.6  HGB 8.3* 9.1*  HCT 26.5* 29.0*  MCV 79.3 79.0  PLT 384 429*   Cardiac Enzymes: No results for input(s): CKTOTAL, CKMB, CKMBINDEX, TROPONINI in the last 72 hours. BNP: Invalid input(s): POCBNP D-Dimer: No results for input(s): DDIMER in the last 72 hours. Hemoglobin A1C: No results for input(s): HGBA1C in the last 72 hours. Fasting Lipid Panel: No results for input(s): CHOL, HDL, LDLCALC,  TRIG, CHOLHDL, LDLDIRECT in the last 72 hours. Thyroid Function Tests: No results for input(s): TSH, T4TOTAL, T3FREE, THYROIDAB in the last 72 hours.  Invalid input(s): FREET3 Anemia Panel: No results for input(s): VITAMINB12, FOLATE, FERRITIN, TIBC, IRON, RETICCTPCT in the last 72 hours.  RADIOLOGY: Dg Chest 2 View  08/10/2014   CLINICAL DATA:  Congestive heart failure  EXAM: CHEST  2 VIEW  COMPARISON:  August 05, 2014  FINDINGS: There is cardiomegaly with mild pulmonary venous hypertension. There are bilateral pleural effusions with mild mid and lower lung zone interstitial edema. No airspace consolidation. Patient is status post internal mammary bypass grafting. There is a fairly sizable hiatal hernia.  IMPRESSION: Evidence of persistent congestive heart failure. No airspace consolidation. Hiatal hernia.   Electronically Signed   By: Lowella Grip III M.D.   On: 08/10/2014 12:02   Dg Chest 2 View  08/05/2014   CLINICAL DATA:  Pleural effusion, shortness of breath  EXAM: CHEST  2 VIEW  COMPARISON:  08/03/2014  FINDINGS: Moderate enlargement of the cardiac silhouette noted. Hyperinflation and coarsened interstitial opacities suggest emphysema with possible superimposed interstitial edema. Bilateral hilar prominence with tapering may suggest pulmonary arterial hypertension. Small pleural effusions. Bones are subjectively osteopenic. Patchy bibasilar opacities are most likely compressive atelectasis. Atheromatous aortic calcification noted.  IMPRESSION: Probable emphysematous change with cardiomegaly and possible superimposed interstitial edema.  Small pleural effusions.   Electronically Signed   By: Conchita Paris M.D.   On: 08/05/2014 09:55   Dg Chest 2 View  08/03/2014   CLINICAL DATA:  Former smoker with current history of oxygen dependent COPD, presenting with left-sided chest pain and paresthesias. Prior CABG.  EXAM: CHEST  2 VIEW  COMPARISON:  06/14/2014 and earlier, including CT chest  06/05/2014.  FINDINGS: Prior sternotomy CABG. Cardiac silhouette markedly enlarged but stable. Prominent right pulmonary artery accounting for the opacity in the right hilum as identified on the prior CT. Large hiatal hernia, unchanged. Mild diffuse interstitial pulmonary edema with Kerley B-lines, new since the most recent examination 06/14/2014. Interval increase in size of a now moderately large right pleural effusion. Stable small left pleural effusion. New consolidation in the anterior right lower lobe. Osseous demineralization, thoracolumbar scoliosis, and exaggeration of the usual thoracic kyphosis.  IMPRESSION: 1. Mild CHF, with stable marked cardiomegaly and  mild diffuse interstitial pulmonary edema. 2. Moderately large right pleural effusion, increased since 06/14/2014. Stable small left pleural effusion. (The patient has chronic bilateral effusions. ) 3. New consolidation in the anterior right lower lobe, passive atelectasis versus pneumonia. 4. Stable large hiatal hernia.   Electronically Signed   By: Evangeline Dakin M.D.   On: 08/03/2014 20:17      ASSESSMENT AND PLAN: 81 y/o female with a history of CAD s/p CABG (09/2008, LIMA to LAD and free RIMA Y graft to OM off of LIMA with ortic root replacement) ischemic cardiomyopathy (30% to 35%, 09/8336), chronic systolic CHF, HTN, HLD, tobacco abuse, COPD on 2L oxygen, CKD, PAD, and severe mitral regurgitation, readmitted for a/c CHF and chest pain. She has had multiple readmissions for acute HF secondary to her severe mitral valve disease over the last several months.  Diuretics currently on hold and nephrology has held Coreg, hydralazine, and Imdur to allow BP to increase to help improve renal function.  1. Acute on Chronic Systolic CHF:  - EF 25% on echo 08/04/2014 and unchanged compared to prior.  - Cr pending today (increased yesterday), weight stable from yesterday at 129 lbs. Continue to hold Lasix.   2. CAD: h/o  CABG in 2010.  - cath 08/06/2014 patent DES to prox RCA, 70% prox to mid LCx, 50% prox to mid LAD, 90% OM1, 75% D1, EF 15-20%. Severe MR.  - per Dr. Tamala Julian, in the absence of anginal pattern, intervention on OM1 is questionable and potentially harmful.   3. Severe Mitral Valve Regurgitation: - Per Dr. Tamala Julian, severe MR likely significant contribution from annular dilatation, however may have possible ischemic component and intrinsic mitral valve dx. Felt with significant deterioration of LV function, impact of mitral clip on symptoms may be minimal  - TEE cancelled as pt went into new afib with RVR after leaving telemetry floor and arriving in Endo. Paroxysms of atrial fib may also play a role with her decompensating episodes. On this basis decided not to proceed with transesophageal echo.   4. Acute on chronic renal insufficiency: Cr pending today (continue to worsen yesterday). Likely related to hypotension and contrast nephropathy.  Nephrology holding antihypertensives and gave IVF.  5. Newly onset PAF with RVR: Went into PAF on 7/26 somewhere between leaving telemetry floor and arriving at Endo for TEE, TEE was cancelled as result - CHA2DS2-Vasc score 6 (female, age, CAD, HF, HTN) - placed on IV amio, converted to NSR on the same day - No systemic anticoagulation as she is on ASA and plavix and has baseline anemia.  - her metoprolol was changed to Coreg during this admission for LV dysfunction, if has recurrence of PAF in the future, may need to change back to metoprolol (Coreg currently held by nephrology to allow BP to increase) - continue amiodarone 200mg  BID, likely can transition to 200mg  daily in 1 month  6. Anemia: No CBC today.    Kate Sable, M.D., F.A.C.C.

## 2014-08-12 NOTE — Progress Notes (Signed)
[facility] PROGRESS NOTE  [name] IOE:[id number] DOB: [birth year] DOA: 08/03/2014 PCP: Leonard Downing, MD  Assessment/Plan: 1. Acute on chronic combined systolic and diastolic congestive heart failure. -Patient presenting with clinical signs and symptoms consistent with acute CHF -Her last transthoracic echocardiogram was performed on 04/20/2014 that revealed an EF of 30-35% with diffuse hypokinesis as well as grade 2 diastolic dysfunction. -She was seen and evaluated by cardiology feeling that severe MR contributing to CHF, along with PAF. -Cr up and plan is to hold lasix and follow trend. Volume is stable currently and w/o signs of fluid overload.  -coreg and nitrates on hold as per renal rec's -will follow renal function and then slowly adjust medications for discharge  2.  Coronary artery disease. -Patient had reported having chest pain overnight. Troponins negative. -She had a cardiac catheterizations done on 06/04/2014 and 05/30/2014 showing 85% in-stent restenosis in the proximal RCA, proximal circumflex to mid circumflex 70% stenosis, first marginal lesion 80% stenosis, first on the lesion 90% stenosis in proximal LAD to mid LAD lesion 50% stenosis. She had stenting with drug-eluting stent on 06/04/2014, with plans for staged PCI given chronic kidney disease.  -Cardiology felt that circumflex and first marginal stenosis could be causing ischemic MR -Patient undergoing cardiac catheterization on 08/06/2014 showing widely patent drug-eluting stent to the proximal RCA, proximal circumflex to mid circumflex lesion 70% stenosis, proximal LAD 2 mid LAD lesion 50% stenosis, first marginal containing ostial 90% stenosis, first diagonal branches containing 75% stenosis.  -Patient found having EF of 15-20%.  -Cardiology recommending continuing medical management -continue b-blocker, ASA and Plavix. No CP reported by patient   3.  Atrial fibrillation with rapid ventricular  response -On 08/07/2014 she was found to be in A. fib with RVR having ventricular rates in the 150s -She was started on an amiodarone drip and hep drip -per cardiology will d/c heparin and continue amiodarone alone; plan is to transitioned it to PO -patient sinus rhythm now -ChadsVasc score 6  4.  Mitral valve regurgitation -Transthoracic echocardiogram from 04/20/2014 showing severe regurgitation -Possibility of mitral clip had been discussed, unclear benefits, TEE had been planned for this admission, but patient experienced A. Fib with RVR and has now been cancelled -will continue controlling volume and arrhyhtmia -will follow cardiology rec's  5. Hypertension -Continue Imdur dose to 15 mg PO daily and coreg 3.125 mg PO BID -watch VS and try to avoid hypotension   6.  Dyslipidemia -Continue pravastatin 80 mg by mouth daily  7. Acute on chronic kidney dysfunction: stage 3-4 at baseline -most likely multifactorial: contrast, hypotension and diuresis -will follow trend -continue holding diuretics for now -appreciate renal service assistance and rec's -CR stable to slightly improved. Will give another 20 hrs of judicious IVF's -continue holding antihypertensive agents   8. chronic anemia: -most likely associated with CKD -Hgb 9.1 -no signs of overt bleeding -will monitor   Code Status: Full code Family Communication: Spoke with her daughter was present at bedside Disposition Plan: continue medical therapy; remains inpatient. Renal function has worsen and preventing her to be discharge.    Consultants:  Cardiology  Renal service.  Procedures:  Transthoracic echocardiogram impression: Left ventricle: The cavity size was moderately dilated. Wall thickness was normal. The estimated ejection fraction was 30%. Diffuse hypokinesis. Findings consistent with left ventricular diastolic dysfunction. Doppler parameters are consistent with high ventricular filling  pressure. Mildly thickened leaflets . There was severe regurgitation.  HPI/Subjective: Patient denies CP, SOB, nausea/vomiting, abd pain  or any other complaints. No dysuria and no fever. Cr stable to slightly improved.  Objective: Filed Vitals:   08/12/14 0900  BP: 95/51  Pulse: 70  Temp: 98.1 F (36.7 C)  Resp: 20    Intake/Output Summary (Last 24 hours) at 08/12/14 1227 Last data filed at 08/12/14 1005  Gross per 24 hour  Intake    965 ml  Output    600 ml  Net    365 ml   Filed Weights   08/10/14 0529 08/11/14 0613 08/12/14 0640  Weight: 58.65 kg (129 lb 4.8 oz) 58.695 kg (129 lb 6.4 oz) 58.922 kg (129 lb 14.4 oz)    Exam:   General:  Feeling better, no CP and endorses breathing is good and stable. Patient kidney function plateau to slightly improved at 2.0. No dysuria and no fever.   Cardiovascular: positive SEM, no rubs or gallops; no LE edema or JVD on exam  Respiratory: good air movement, no crackles on exam; no wheezing or rhonchi appreciated   Abdomen: Soft, nontender, nondistended  Extremities:  resolution of edema, no cyanosis   Data Reviewed: Basic Metabolic Panel:  Recent Labs Lab 08/08/14 0409 08/09/14 0312 08/10/14 0944 08/11/14 0340 08/12/14 0917  NA 135 131* 128* 128* 129*  K 3.7 4.1 4.4 4.5 4.6  CL 99* 95* 93* 95* 94*  CO2 28 26 25 25 25   GLUCOSE 96 168* 108* 82 103*  BUN 20 23* 30* 33* 35*  CREATININE 2.02* 2.39* 2.76* 2.89* 2.80*  CALCIUM 8.8* 8.6* 9.0 8.7* 8.9  PHOS  --   --   --   --  4.7*   CBC:  Recent Labs Lab 08/07/14 0345 08/08/14 0409 08/09/14 0312 08/10/14 0356 08/10/14 1218  WBC 6.0 6.3 5.7 5.1 5.6  HGB 10.3* 9.2* 8.6* 8.3* 9.1*  HCT 33.3* 29.6* 27.3* 26.5* 29.0*  MCV 82.0 81.1 80.1 79.3 79.0  PLT 503* 411* 405* 384 429*   BNP (last 3 results)  Recent Labs  05/22/14 0409 05/29/14 1000 08/03/14 2021  BNP 801.8* 851.1* 2801.4*    Studies: No results found.  Scheduled Meds: . amiodarone  200 mg  Oral BID  . aspirin EC  81 mg Oral Daily  . clopidogrel  75 mg Oral Q breakfast  . docusate sodium  100 mg Oral BID  . polyethylene glycol  17 g Oral BID  . potassium chloride  10 mEq Oral Daily  . pravastatin  80 mg Oral QAC supper   Continuous Infusions: . sodium chloride      Active Problems:   Hyperlipidemia   Essential hypertension   CAD-native   Chronic combined systolic and diastolic heart failure, NYHA class 2   COPD (chronic obstructive pulmonary disease)   CKD (chronic kidney disease), stage IV   Acute systolic congestive heart failure   Malnutrition of moderate degree   Acute systolic CHF (congestive heart failure), NYHA class 4   Severe mitral regurgitation   Atrial fibrillation with RVR   Paroxysmal atrial fibrillation   CHF exacerbation   Acute on chronic systolic heart failure   Anemia of chronic disease   Acute renal failure superimposed on stage 4 chronic kidney disease   Time spent: 35 min   [facility] Pager [phone]. If 7PM-7AM, please contact night-coverage at [email or web address], password [facility] 08/12/2014, 12:27 PM  LOS: 9 days

## 2014-08-12 NOTE — Progress Notes (Signed)
Slept well during the night, NAD noted.

## 2014-08-12 NOTE — Progress Notes (Signed)
Sacral area reddened.  Cleaned and applied moisture barrier cream per protocol.  Also, placed sacral foam dressing to be changed PRN.

## 2014-08-12 NOTE — Progress Notes (Signed)
Asked pt if she wanted to ambulate in the halls, and she replied, "maybe later."

## 2014-08-12 NOTE — Progress Notes (Addendum)
  [facility] Progress Note   Subjective: creat down slightly today to 2.8  Filed Vitals:   08/11/14 2201 08/12/14 0153 08/12/14 0640 08/12/14 0900  BP: 99/57 100/48 112/56 95/51  Pulse: 66 64 88 70  Temp: 98.3 F (36.8 C) 98.2 F (36.8 C) 98 F (36.7 C) 98.1 F (36.7 C)  TempSrc: Oral Oral Oral Oral  Resp: 20 20 20 20   Height:      Weight:   58.922 kg (129 lb 14.4 oz)   SpO2: 93% 96% 93% 100%   Exam: Elderly alert female, up in chair, no distress Flat neck veins, no jvd Chest clear throughout, no wheezing or rales RRR no RG Abd soft ntnd no mass, no ascites or hsm Neuro is Ox 3, nonfocal Ext no LE or UE edema  UA pending ECHO 7/23 LV 30%, RV mildly reduced fxn CXR 7/24 +CHF  Assessment: 1. Acute on CRF - due to relative hypotension in setting of diuresis.  Slightly better today. Holding BP-lowering medications given low BP's.  Baseline creat 1.6- 1.8 but this was when wet and baseline "dry" creatinine may need to be higher ~2.0 .  Low FeNa, giving cautious fluids as EF is low at 30%. Cont fluids, check creat in am. Of note patient says she would not do dialysis if her kidneys got worse, and said that she's "too old" and has "lived a good life".  2. Hyponatremia, mild 3. CHF exacerbation, s/p diuresis - recurrent issue. Hx of chronic ICM. 4. CAD hx CABG 5. Hx severe MR 6. New afib with RVR    Plan - as above    Kelly Splinter MD  pager [phone]    cell [phone]  08/12/2014, 11:26 AM     Recent Labs Lab 08/10/14 0944 08/11/14 0340 08/12/14 0917  NA 128* 128* 129*  K 4.4 4.5 4.6  CL 93* 95* 94*  CO2 25 25 25   GLUCOSE 108* 82 103*  BUN 30* 33* 35*  CREATININE 2.76* 2.89* 2.80*  CALCIUM 9.0 8.7* 8.9  PHOS  --   --  4.7*    Recent Labs Lab 08/12/14 0917  ALBUMIN 3.0*    Recent Labs Lab 08/09/14 0312 08/10/14 0356 08/10/14 1218  WBC 5.7 5.1 5.6  HGB 8.6* 8.3* 9.1*  HCT 27.3* 26.5* 29.0*  MCV 80.1 79.3 79.0  PLT 405* 384  429*   . amiodarone  200 mg Oral BID  . aspirin EC  81 mg Oral Daily  . clopidogrel  75 mg Oral Q breakfast  . docusate sodium  100 mg Oral BID  . polyethylene glycol  17 g Oral BID  . potassium chloride  10 mEq Oral Daily  . pravastatin  80 mg Oral QAC supper  . sodium chloride  3 mL Intravenous Q12H  . sodium chloride  3 mL Intravenous Q12H     sodium chloride, acetaminophen, albuterol, ipratropium-albuterol, nitroGLYCERIN, ondansetron (ZOFRAN) IV, sodium chloride, sodium chloride

## 2014-08-13 LAB — BASIC METABOLIC PANEL
Anion gap: 7 (ref 5–15)
BUN: 30 mg/dL — AB (ref 6–20)
CALCIUM: 8.8 mg/dL — AB (ref 8.9–10.3)
CO2: 25 mmol/L (ref 22–32)
Chloride: 94 mmol/L — ABNORMAL LOW (ref 101–111)
Creatinine, Ser: 2.56 mg/dL — ABNORMAL HIGH (ref 0.44–1.00)
GFR calc Af Amer: 19 mL/min — ABNORMAL LOW (ref >60)
GFR calc non Af Amer: 16 mL/min — ABNORMAL LOW (ref >60)
Glucose, Bld: 125 mg/dL — ABNORMAL HIGH (ref 65–99)
POTASSIUM: 4.1 mmol/L (ref 3.5–5.1)
Sodium: 126 mmol/L — ABNORMAL LOW (ref 135–145)

## 2014-08-13 MED ORDER — POTASSIUM CHLORIDE CRYS ER 20 MEQ PO TBCR
20.0000 meq | EXTENDED_RELEASE_TABLET | Freq: Every day | ORAL | Status: DC
  Administered 2014-08-13 – 2014-08-15 (×3): 20 meq via ORAL
  Filled 2014-08-13 (×3): qty 1

## 2014-08-13 MED ORDER — DARBEPOETIN ALFA 100 MCG/0.5ML IJ SOSY
100.0000 ug | PREFILLED_SYRINGE | INTRAMUSCULAR | Status: DC
  Administered 2014-08-15: 100 ug via SUBCUTANEOUS
  Filled 2014-08-13 (×3): qty 0.5

## 2014-08-13 MED ORDER — FUROSEMIDE 40 MG PO TABS
40.0000 mg | ORAL_TABLET | Freq: Two times a day (BID) | ORAL | Status: DC
  Administered 2014-08-13 – 2014-08-14 (×3): 40 mg via ORAL
  Filled 2014-08-13 (×5): qty 1

## 2014-08-13 MED ORDER — CARVEDILOL 3.125 MG PO TABS
3.1250 mg | ORAL_TABLET | Freq: Two times a day (BID) | ORAL | Status: DC
  Administered 2014-08-13 – 2014-08-15 (×4): 3.125 mg via ORAL
  Filled 2014-08-13 (×6): qty 1

## 2014-08-13 MED ORDER — HYDROCODONE-ACETAMINOPHEN 5-325 MG PO TABS
1.0000 | ORAL_TABLET | Freq: Four times a day (QID) | ORAL | Status: DC | PRN
  Administered 2014-08-13: 1 via ORAL
  Filled 2014-08-13: qty 1

## 2014-08-13 NOTE — Progress Notes (Signed)
Utilization review complete. Tmya Wigington RN CCM Case Mgmt phone [phone] 

## 2014-08-13 NOTE — Progress Notes (Signed)
Pt c/o shortness of breath after using bathroom, requesting neb treatment, prn albuterol given, symptoms relieved. Will monitor.

## 2014-08-13 NOTE — Progress Notes (Signed)
Subjective:  Looks well.  Says was SOB but blames it on chemicals used to clean room- said that neb did not help much- very little UOP recorded - weight is up to admission weight  Objective Vital signs in last 24 hours: Filed Vitals:   08/12/14 1300 08/12/14 2035 08/13/14 0446 08/13/14 0824  BP: 141/55 122/56  116/47  Pulse: 84 78  75  Temp: 98.4 F (36.9 C) 98.1 F (36.7 C)    TempSrc: Oral Oral    Resp: 20 20    Height:      Weight:   60.056 kg (132 lb 6.4 oz)   SpO2: 97% 96%     Weight change: 1.134 kg (2 lb 8 oz)  Intake/Output Summary (Last 24 hours) at 08/13/14 0936 Last data filed at 08/13/14 6789  Gross per 24 hour  Intake 1561.67 ml  Output    226 ml  Net 1335.67 ml    Assessment/ Plan: Pt is a 81 y.o. yo female with known CKD and CHF (EF 30%) who was admitted on 08/03/2014 with CHF- multiple hosps for same.  Also had worsening of CKD this admit  Assessment/Plan: 1. Renal- A on CRF this hosp thought to be due to decreased renal perfusion.  Diuretics had been held but now also hydalazine and lopressor held with increased renal perfusion. Creatinine is trending down   2. Volume- after lasix held- weight is going up and sodium is going down- no obvious peripheral edema but will resume lasix  3. Anemia- low /stable- check iron stores and give aranesp here in hosp 4. Dispo- pt states that she would not do dialysis under any circumstances- followed by Dr. Marval Regal as OP- has been an issue with frequent hosps of late 4. Hypokalemia- on repletion-  is appropriate- in fact will increase since giving back lasix   Lovely Kerins A    Labs: Basic Metabolic Panel:  Recent Labs Lab 08/11/14 0340 08/12/14 0917 08/13/14 0241  NA 128* 129* 126*  K 4.5 4.6 4.1  CL 95* 94* 94*  CO2 25 25 25   GLUCOSE 82 103* 125*  BUN 33* 35* 30*  CREATININE 2.89* 2.80* 2.56*  CALCIUM 8.7* 8.9 8.8*  PHOS  --  4.7*  --    Liver Function Tests:  Recent Labs Lab 08/12/14 0917   ALBUMIN 3.0*   No results for input(s): LIPASE, AMYLASE in the last 168 hours. No results for input(s): AMMONIA in the last 168 hours. CBC:  Recent Labs Lab 08/07/14 0345 08/08/14 0409 08/09/14 0312 08/10/14 0356 08/10/14 1218  WBC 6.0 6.3 5.7 5.1 5.6  HGB 10.3* 9.2* 8.6* 8.3* 9.1*  HCT 33.3* 29.6* 27.3* 26.5* 29.0*  MCV 82.0 81.1 80.1 79.3 79.0  PLT 503* 411* 405* 384 429*   Cardiac Enzymes: No results for input(s): CKTOTAL, CKMB, CKMBINDEX, TROPONINI in the last 168 hours. CBG: No results for input(s): GLUCAP in the last 168 hours.  Iron Studies: No results for input(s): IRON, TIBC, TRANSFERRIN, FERRITIN in the last 72 hours. Studies/Results: No results found. Medications: Infusions:    Scheduled Medications: . amiodarone  200 mg Oral BID  . aspirin EC  81 mg Oral Daily  . clopidogrel  75 mg Oral Q breakfast  . docusate sodium  100 mg Oral BID  . polyethylene glycol  17 g Oral BID  . potassium chloride  10 mEq Oral Daily  . pravastatin  80 mg Oral QAC supper    have reviewed scheduled and prn medications.  Physical  Exam: General: alert, talkative Heart: RRR Lungs: mostly clear Abdomen: soft, non tender Extremities: no peripheral edema    08/13/2014,9:36 AM  LOS: 10 days

## 2014-08-13 NOTE — Care Management Important Message (Signed)
Important Message  Patient Details  Name: WESLYN HOLSONBACK MRN: [id number] Date of Birth: [birth year]   Medicare Important Message Given:  Yes-fourth notification given    Pricilla Handler 08/13/2014, 12:28 PM

## 2014-08-13 NOTE — Progress Notes (Signed)
[facility] PROGRESS NOTE  [name] MOQ:[id number] DOB: [birth year] DOA: 08/03/2014 PCP: Leonard Downing, MD  Assessment/Plan: 1. Acute on chronic combined systolic and diastolic congestive heart failure. -Patient presenting with clinical signs and symptoms consistent with acute CHF -Her last transthoracic echocardiogram was performed on 04/20/2014 that revealed an EF of 30-35% with diffuse hypokinesis as well as grade 2 diastolic dysfunction. -She was seen and evaluated by cardiology feeling that severe MR contributing to CHF, along with PAF. -Cr is better and plan is to resume low dose lasix and follow trend. Weight is up, but overall Volume status is stable and w/o signs of fluid overload.  -coreg will be resumed today (low dose) -will follow renal function and then slowly adjust medications for discharge  2.  Coronary artery disease. -Patient had reported having chest pain overnight. Troponins negative. -She had a cardiac catheterizations done on 06/04/2014 and 05/30/2014 showing 85% in-stent restenosis in the proximal RCA, proximal circumflex to mid circumflex 70% stenosis, first marginal lesion 80% stenosis, first on the lesion 90% stenosis in proximal LAD to mid LAD lesion 50% stenosis. She had stenting with drug-eluting stent on 06/04/2014, with plans for staged PCI given chronic kidney disease.  -Cardiology felt that circumflex and first marginal stenosis could be causing ischemic MR -Patient undergoing cardiac catheterization on 08/06/2014 showing widely patent drug-eluting stent to the proximal RCA, proximal circumflex to mid circumflex lesion 70% stenosis, proximal LAD 2 mid LAD lesion 50% stenosis, first marginal containing ostial 90% stenosis, first diagonal branches containing 75% stenosis.  -Patient found having EF of 15-20%.  -Cardiology recommending continuing medical management -continue b-blocker, ASA and Plavix.  -No CP reported by patient   3.  Atrial  fibrillation with rapid ventricular response -On 08/07/2014 she was found to be in A. fib with RVR having ventricular rates in the 150s -per cardiology no anticoagulation candidate -will continue amiodarone and the use of ASA/plavix -patient in sinus rhythm now -ChadsVasc score 6  4.  Mitral valve regurgitation -Transthoracic echocardiogram from 04/20/2014 showing severe regurgitation -Possibility of mitral clip had been discussed, unclear benefits, TEE had been planned for this admission, but patient experienced A. Fib with RVR and has now been cancelled -will continue controlling volume and arrhyhtmia -will follow cardiology rec's  5. Hypertension -Continue holding Imdur -will resume low dose coreg -watch VS and try to avoid hypotension   6.  Dyslipidemia -Continue pravastatin 80 mg by mouth daily  7. Acute on chronic kidney dysfunction: stage 3-4 at baseline -most likely multifactorial: contrast, hypotension and diuresis -will follow trend -continue holding diuretics for now -appreciate renal service assistance and rec's -CR improved down to 2.5 after IVF's, holding diuretics and antihypertensives -plan is to resume low dose lasix  -will follow renal rec's -BMET in am   8. chronic anemia: -most likely associated with CKD -last Hgb 9.1 -no signs of overt bleeding -will monitor   Code Status: Full code Family Communication: Spoke with her daughter was present at bedside Disposition Plan: continue medical therapy; remains inpatient. Renal function has worsen and preventing her to be discharge.    Consultants:  Cardiology  Renal service.  Procedures:  Transthoracic echocardiogram impression: Left ventricle: The cavity size was moderately dilated. Wall thickness was normal. The estimated ejection fraction was 30%. Diffuse hypokinesis. Findings consistent with left ventricular diastolic dysfunction. Doppler parameters are consistent with high ventricular  filling pressure. Mildly thickened leaflets . There was severe regurgitation.  HPI/Subjective: Patient denies CP, no fever and with mild  sensation of SOB (she expressed due to bronchospasm after room cleaned with clorox). Patient denies dysuria, abd pain, nausea and vomiting. Creatinine is better and BP improved   Objective: Filed Vitals:   08/13/14 1016  BP: 129/63  Pulse: 79  Temp:   Resp:     Intake/Output Summary (Last 24 hours) at 08/13/14 1244 Last data filed at 08/13/14 1016  Gross per 24 hour  Intake 1321.67 ml  Output    226 ml  Net 1095.67 ml   Filed Weights   08/11/14 0613 08/12/14 0640 08/13/14 0446  Weight: 58.695 kg (129 lb 6.4 oz) 58.922 kg (129 lb 14.4 oz) 60.056 kg (132 lb 6.4 oz)    Exam:   General:  Feeling better, no CP and endorses breathing is ok (even she endorses some bronchospasm after her room was clean with clorox). BP is better and Cr improved to 2.5. patient weight is also up.   Cardiovascular: positive SEM, no rubs or gallops; no LE edema or JVD on exam  Respiratory: good air movement, no crackles on exam; no wheezing or rhonchi appreciated   Abdomen: Soft, nontender, nondistended  Extremities:  resolution of edema, no cyanosis   Data Reviewed: Basic Metabolic Panel:  Recent Labs Lab 08/09/14 0312 08/10/14 0944 08/11/14 0340 08/12/14 0917 08/13/14 0241  NA 131* 128* 128* 129* 126*  K 4.1 4.4 4.5 4.6 4.1  CL 95* 93* 95* 94* 94*  CO2 26 25 25 25 25   GLUCOSE 168* 108* 82 103* 125*  BUN 23* 30* 33* 35* 30*  CREATININE 2.39* 2.76* 2.89* 2.80* 2.56*  CALCIUM 8.6* 9.0 8.7* 8.9 8.8*  PHOS  --   --   --  4.7*  --    CBC:  Recent Labs Lab 08/07/14 0345 08/08/14 0409 08/09/14 0312 08/10/14 0356 08/10/14 1218  WBC 6.0 6.3 5.7 5.1 5.6  HGB 10.3* 9.2* 8.6* 8.3* 9.1*  HCT 33.3* 29.6* 27.3* 26.5* 29.0*  MCV 82.0 81.1 80.1 79.3 79.0  PLT 503* 411* 405* 384 429*   BNP (last 3 results)  Recent Labs  05/22/14 0409  05/29/14 1000 08/03/14 2021  BNP 801.8* 851.1* 2801.4*    Studies: No results found.  Scheduled Meds: . amiodarone  200 mg Oral BID  . aspirin EC  81 mg Oral Daily  . carvedilol  3.125 mg Oral BID WC  . clopidogrel  75 mg Oral Q breakfast  . [START ON 08/14/2014] darbepoetin (ARANESP) injection - NON-DIALYSIS  100 mcg Subcutaneous Q Tue-1800  . docusate sodium  100 mg Oral BID  . furosemide  40 mg Oral BID  . polyethylene glycol  17 g Oral BID  . potassium chloride  20 mEq Oral Daily  . pravastatin  80 mg Oral QAC supper   Continuous Infusions:    Active Problems:   Hyperlipidemia   Essential hypertension   CAD-native   Chronic combined systolic and diastolic heart failure, NYHA class 2   COPD (chronic obstructive pulmonary disease)   CKD (chronic kidney disease), stage IV   Acute systolic congestive heart failure   Malnutrition of moderate degree   Acute systolic CHF (congestive heart failure), NYHA class 4   Severe mitral regurgitation   Atrial fibrillation with RVR   Paroxysmal atrial fibrillation   CHF exacerbation   Acute on chronic systolic heart failure   Anemia of chronic disease   Acute renal failure superimposed on stage 4 chronic kidney disease   Time spent: 35 min   Barton Dubois  [facility] Pager [phone]. If 7PM-7AM, please contact night-coverage at [email or web address], password [facility] 08/13/2014, 12:44 PM  LOS: 10 days

## 2014-08-13 NOTE — Progress Notes (Signed)
Patient Name: [name] Date of Encounter: 08/13/2014  Primary Cardiologist: Dr. Angelena Form   Active Problems:   Hyperlipidemia   Essential hypertension   CAD-native   Chronic combined systolic and diastolic heart failure, NYHA class 2   COPD (chronic obstructive pulmonary disease)   CKD (chronic kidney disease), stage IV   Acute systolic congestive heart failure   Malnutrition of moderate degree   Acute systolic CHF (congestive heart failure), NYHA class 4   Severe mitral regurgitation   Atrial fibrillation with RVR   Paroxysmal atrial fibrillation   CHF exacerbation   Acute on chronic systolic heart failure   Anemia of chronic disease   Acute renal failure superimposed on stage 4 chronic kidney disease    SUBJECTIVE  Denies any CP. Back pain bothering her. Had some SOB last night from chlorine, does not feel like the HF SOB she came in with  CURRENT MEDS . amiodarone  200 mg Oral BID  . aspirin EC  81 mg Oral Daily  . clopidogrel  75 mg Oral Q breakfast  . [START ON 08/14/2014] darbepoetin (ARANESP) injection - NON-DIALYSIS  100 mcg Subcutaneous Q Tue-1800  . docusate sodium  100 mg Oral BID  . furosemide  40 mg Oral BID  . polyethylene glycol  17 g Oral BID  . potassium chloride  20 mEq Oral Daily  . pravastatin  80 mg Oral QAC supper    OBJECTIVE  Filed Vitals:   08/12/14 2035 08/13/14 0446 08/13/14 0824 08/13/14 1016  BP: 122/56  116/47 129/63  Pulse: 78  75 79  Temp: 98.1 F (36.7 C)     TempSrc: Oral     Resp: 20     Height:      Weight:  132 lb 6.4 oz (60.056 kg)    SpO2: 96%       Intake/Output Summary (Last 24 hours) at 08/13/14 1021 Last data filed at 08/13/14 1016  Gross per 24 hour  Intake 1561.67 ml  Output    226 ml  Net 1335.67 ml   Filed Weights   08/11/14 0613 08/12/14 0640 08/13/14 0446  Weight: 129 lb 6.4 oz (58.695 kg) 129 lb 14.4 oz (58.922 kg) 132 lb 6.4 oz (60.056 kg)    PHYSICAL EXAM  General: Pleasant, NAD. Neuro:  Alert and oriented X 3. Moves all extremities spontaneously. Psych: Normal affect. HEENT:  Normal  Neck: Supple without bruits or JVD. Lungs:  Resp regular and unlabored, CTA. No obvious rale.  Heart: RRR no s3, s4. 3/6 systolic murmur.  Abdomen: Soft, non-tender, non-distended, BS + x 4.  Extremities: No clubbing, cyanosis or edema. DP/PT/Radials 2+ and equal bilaterally.  Accessory Clinical Findings  CBC  Recent Labs  08/10/14 1218  WBC 5.6  HGB 9.1*  HCT 29.0*  MCV 79.0  PLT 825*   Basic Metabolic Panel  Recent Labs  08/12/14 0917 08/13/14 0241  NA 129* 126*  K 4.6 4.1  CL 94* 94*  CO2 25 25  GLUCOSE 103* 125*  BUN 35* 30*  CREATININE 2.80* 2.56*  CALCIUM 8.9 8.8*  PHOS 4.7*  --    Liver Function Tests  Recent Labs  08/12/14 0917  ALBUMIN 3.0*    TELE NSR    ECG  No new EKG  Echocardiogram  LV EF: 30%  ------------------------------------------------------------------- Indications:   CHF - 428.0.  ------------------------------------------------------------------- History:  PMH:  Coronary artery disease. Congestive heart failure. Aortic valve disease. Mitral valve disease. Chronic obstructive pulmonary disease.  PMH:  Myocardial infarction. Risk factors: Former tobacco use. Hypertension.  ------------------------------------------------------------------- Study Conclusions  - Left ventricle: The cavity size was moderately dilated. Wall thickness was normal. The estimated ejection fraction was 30%. Diffuse hypokinesis. Findings consistent with left ventricular diastolic dysfunction. Doppler parameters are consistent with high ventricular filling pressure. - Aortic valve: Leaflets thickened and calcified, Mild AS. Mild AI. - Mitral valve: Mildly thickened leaflets . There was severe regurgitation. - Left atrium: The atrium was severely dilated. - Right ventricle: The cavity size was mildly dilated.  Systolic function was mildly reduced. - Right atrium: The atrium was mildly dilated. - Tricuspid valve: There was mild-moderate regurgitation. - Pulmonary arteries: PA peak pressure: 35 mm Hg (S). - Pericardium, extracardiac: There was a left pleural effusion.     Radiology/Studies  Dg Chest 2 View  08/10/2014   CLINICAL DATA:  Congestive heart failure  EXAM: CHEST  2 VIEW  COMPARISON:  August 05, 2014  FINDINGS: There is cardiomegaly with mild pulmonary venous hypertension. There are bilateral pleural effusions with mild mid and lower lung zone interstitial edema. No airspace consolidation. Patient is status post internal mammary bypass grafting. There is a fairly sizable hiatal hernia.  IMPRESSION: Evidence of persistent congestive heart failure. No airspace consolidation. Hiatal hernia.   Electronically Signed   By: Lowella Grip III M.D.   On: 08/10/2014 12:02   Dg Chest 2 View  08/05/2014   CLINICAL DATA:  Pleural effusion, shortness of breath  EXAM: CHEST  2 VIEW  COMPARISON:  08/03/2014  FINDINGS: Moderate enlargement of the cardiac silhouette noted. Hyperinflation and coarsened interstitial opacities suggest emphysema with possible superimposed interstitial edema. Bilateral hilar prominence with tapering may suggest pulmonary arterial hypertension. Small pleural effusions. Bones are subjectively osteopenic. Patchy bibasilar opacities are most likely compressive atelectasis. Atheromatous aortic calcification noted.  IMPRESSION: Probable emphysematous change with cardiomegaly and possible superimposed interstitial edema.  Small pleural effusions.   Electronically Signed   By: Conchita Paris M.D.   On: 08/05/2014 09:55   Dg Chest 2 View  08/03/2014   CLINICAL DATA:  Former smoker with current history of oxygen dependent COPD, presenting with left-sided chest pain and paresthesias. Prior CABG.  EXAM: CHEST  2 VIEW  COMPARISON:  06/14/2014 and earlier, including CT chest 06/05/2014.   FINDINGS: Prior sternotomy CABG. Cardiac silhouette markedly enlarged but stable. Prominent right pulmonary artery accounting for the opacity in the right hilum as identified on the prior CT. Large hiatal hernia, unchanged. Mild diffuse interstitial pulmonary edema with Kerley B-lines, new since the most recent examination 06/14/2014. Interval increase in size of a now moderately large right pleural effusion. Stable small left pleural effusion. New consolidation in the anterior right lower lobe. Osseous demineralization, thoracolumbar scoliosis, and exaggeration of the usual thoracic kyphosis.  IMPRESSION: 1. Mild CHF, with stable marked cardiomegaly and mild diffuse interstitial pulmonary edema. 2. Moderately large right pleural effusion, increased since 06/14/2014. Stable small left pleural effusion. (The patient has chronic bilateral effusions. ) 3. New consolidation in the anterior right lower lobe, passive atelectasis versus pneumonia. 4. Stable large hiatal hernia.   Electronically Signed   By: Evangeline Dakin M.D.   On: 08/03/2014 20:17    ASSESSMENT AND PLAN  81 y/o female with a history of CAD s/p CABG (09/2008, LIMA to LAD and free RIMA Y graft to OM off of LIMA with ortic root replacement) ischemic cardiomyopathy (30% to 35%, 07/6809), chronic systolic CHF, HTN, HLD, tobacco abuse, COPD on 2L oxygen, CKD,  PAD, and severe mitral regurgitation, readmitted for a/c CHF and chest pain. She has had multiple readmissions for acute HF secondary to her severe mitral valve disease over the last several months.  Diuretics currently on hold and nephrology has held Coreg, hydralazine, and Imdur to allow BP to increase to help improve renal function.  1. Acute on Chronic Systolic CHF:  - EF 39% on echo 08/04/2014 and unchanged compared to prior.   - weight back to admission level 132 lbs. PO lasix restarted today, will monitor renal function  - coreg, hydralazine, and imdur on hold.  Will restarted 3.125mg  coreg.   2. CAD: h/o CABG in 2010.  - cath 08/06/2014 patent DES to prox RCA, 70% prox to mid LCx, 50% prox to mid LAD, 90% OM1, 75% D1, EF 15-20%. Severe MR.  - per Dr. Tamala Julian, in the absence of anginal pattern, intervention on OM1 is questionable and potentially harmful.   3. Severe Mitral Valve Regurgitation: - Per Dr. Tamala Julian, severe MR likely significant contribution from annular dilatation, however may have possible ischemic component and intrinsic mitral valve dx. Felt with significant deterioration of LV function, impact of mitral clip on symptoms may be minimal  - TEE cancelled as pt went into new afib with RVR after leaving telemetry floor and arriving in Endo. Paroxysms of atrial fib may also play a role with her decompensating episodes. On this basis decided not to proceed with transesophageal echo.   4. Acute on chronic renal insufficiency:   -  Likely related to hypotension and contrast nephropathy.   - Nephrology holding antihypertensives and gave IVF.  - Cr improving, baseline 1.6, peaked at 2.89 on 7/30, now trending back down to 2.56  5. Newly onset PAF with RVR: Went into PAF on 7/26 somewhere between leaving telemetry floor and arriving at Endo for TEE, TEE was cancelled as result - CHA2DS2-Vasc score 6 (female, age, CAD, HF, HTN) - placed on IV amio, converted to NSR on the same day - No systemic anticoagulation as she is on ASA and plavix and has baseline anemia.  - her metoprolol was changed to Coreg during this admission for LV dysfunction, if has recurrence of PAF in the future, may need to change back to metoprolol (Coreg currently held by nephrology to allow BP to increase) - continue amiodarone 200mg  BID, likely can transition to 200mg  daily in 1 month  6. Anemia: No CBC today.   Hilbert Corrigan PA-C Pager: [phone]   I have  personally seen and examined this patient with Almyra Deforest, PA-C.  I agree with the assessment and plan as outlined above. Volume status is better. She will be started on Lasix po today since renal function is stable. She is in sinus. Not a good candidate for anti-coagulation given h/o anemia. She is on ASA and Plavix. Will not plan long term anti-coagulation. CHF worsened by her severe MR. Not a surgical candidate. Recent cardiac cath with plan for medical management of CAD. Probable discharge in am if stable.   MCALHANY,CHRISTOPHER 08/13/2014 11:20 AM

## 2014-08-14 LAB — BASIC METABOLIC PANEL
ANION GAP: 9 (ref 5–15)
BUN: 27 mg/dL — AB (ref 6–20)
CALCIUM: 9 mg/dL (ref 8.9–10.3)
CHLORIDE: 96 mmol/L — AB (ref 101–111)
CO2: 24 mmol/L (ref 22–32)
Creatinine, Ser: 2.45 mg/dL — ABNORMAL HIGH (ref 0.44–1.00)
GFR calc Af Amer: 20 mL/min — ABNORMAL LOW (ref >60)
GFR calc non Af Amer: 17 mL/min — ABNORMAL LOW (ref >60)
Glucose, Bld: 137 mg/dL — ABNORMAL HIGH (ref 65–99)
Potassium: 4.5 mmol/L (ref 3.5–5.1)
SODIUM: 129 mmol/L — AB (ref 135–145)

## 2014-08-14 LAB — IRON AND TIBC
IRON: 17 ug/dL — AB (ref 28–170)
Saturation Ratios: 4 % — ABNORMAL LOW (ref 10.4–31.8)
TIBC: 395 ug/dL (ref 250–450)
UIBC: 378 ug/dL

## 2014-08-14 LAB — FERRITIN: Ferritin: 31 ng/mL (ref 11–307)

## 2014-08-14 MED ORDER — SODIUM CHLORIDE 0.9 % IV SOLN
250.0000 mg | Freq: Every day | INTRAVENOUS | Status: DC
  Administered 2014-08-14: 250 mg via INTRAVENOUS
  Filled 2014-08-14 (×2): qty 20

## 2014-08-14 MED ORDER — FUROSEMIDE 80 MG PO TABS
80.0000 mg | ORAL_TABLET | Freq: Two times a day (BID) | ORAL | Status: DC
  Administered 2014-08-14 – 2014-08-15 (×2): 80 mg via ORAL
  Filled 2014-08-14 (×4): qty 1

## 2014-08-14 NOTE — Progress Notes (Signed)
Subjective:  Looks well.  Says breathing is OK- still talking about the chemicals- UOP up and creatinine down but sodium also down  Objective Vital signs in last 24 hours: Filed Vitals:   08/13/14 1739 08/13/14 2026 08/14/14 0558 08/14/14 0825  BP: 117/55 119/69 118/62 104/64  Pulse: 79 80 71 74  Temp:  97.9 F (36.6 C) 97.5 F (36.4 C)   TempSrc:  Oral Oral   Resp:  18 16   Height:      Weight:   59.875 kg (132 lb)   SpO2:  100% 100%    Weight change: -0.181 kg (-6.4 oz)  Intake/Output Summary (Last 24 hours) at 08/14/14 0844 Last data filed at 08/14/14 1740  Gross per 24 hour  Intake    890 ml  Output    575 ml  Net    315 ml    Assessment/ Plan: Pt is a 81 y.o. yo female with known CKD and CHF (EF 30%) who was admitted on 08/03/2014 with CHF- multiple hosps for same.  Also had worsening of CKD this admit  Assessment/Plan: 1. Renal- A on CRF this hosp thought to be due to decreased renal perfusion. Baseline renal function in the high 1's.  Diuretics had been held but now also hydalazine and lopressor held with increased renal perfusion. Creatinine is trending down   2. Volume- after lasix held- weight is going up and sodium  down- lasix restarted yest- UOP up but still less than 600- sodium going down- will double dose to 80 BID.  Low dose coreg restarted as well 3. Anemia- low /stable- needs iron- will give  and also giving aranesp here in hosp 4. Dispo- pt states that she would not do dialysis under any circumstances- followed by Dr. Marval Regal as OP- needs increased mobility 4. Hypokalemia- on repletion-  is appropriate-    Kadience Macchi A    Labs: Basic Metabolic Panel:  Recent Labs Lab 08/11/14 0340 08/12/14 0917 08/13/14 0241  NA 128* 129* 126*  K 4.5 4.6 4.1  CL 95* 94* 94*  CO2 25 25 25   GLUCOSE 82 103* 125*  BUN 33* 35* 30*  CREATININE 2.89* 2.80* 2.56*  CALCIUM 8.7* 8.9 8.8*  PHOS  --  4.7*  --    Liver Function Tests:  Recent Labs Lab  08/12/14 0917  ALBUMIN 3.0*   No results for input(s): LIPASE, AMYLASE in the last 168 hours. No results for input(s): AMMONIA in the last 168 hours. CBC:  Recent Labs Lab 08/08/14 0409 08/09/14 0312 08/10/14 0356 08/10/14 1218  WBC 6.3 5.7 5.1 5.6  HGB 9.2* 8.6* 8.3* 9.1*  HCT 29.6* 27.3* 26.5* 29.0*  MCV 81.1 80.1 79.3 79.0  PLT 411* 405* 384 429*   Cardiac Enzymes: No results for input(s): CKTOTAL, CKMB, CKMBINDEX, TROPONINI in the last 168 hours. CBG: No results for input(s): GLUCAP in the last 168 hours.  Iron Studies:   Recent Labs  08/14/14 0408  IRON 17*  TIBC 395  FERRITIN 31   Studies/Results: No results found. Medications: Infusions:    Scheduled Medications: . amiodarone  200 mg Oral BID  . aspirin EC  81 mg Oral Daily  . carvedilol  3.125 mg Oral BID WC  . clopidogrel  75 mg Oral Q breakfast  . darbepoetin (ARANESP) injection - NON-DIALYSIS  100 mcg Subcutaneous Q Tue-1800  . docusate sodium  100 mg Oral BID  . furosemide  40 mg Oral BID  . polyethylene glycol  17 g  Oral BID  . potassium chloride  20 mEq Oral Daily  . pravastatin  80 mg Oral QAC supper    have reviewed scheduled and prn medications.  Physical Exam: General: alert, talkative Heart: RRR Lungs: mostly clear Abdomen: soft, non tender Extremities: no peripheral edema- but possibly sacral    08/14/2014,8:44 AM  LOS: 11 days

## 2014-08-14 NOTE — Progress Notes (Signed)
SUBJECTIVE: Breathing is better. No pain.   BP 104/64 mmHg  Pulse 74  Temp(Src) 97.5 F (36.4 C) (Oral)  Resp 16  Ht 5\' 6"  (1.676 m)  Wt 132 lb (59.875 kg)  BMI 21.32 kg/m2  SpO2 100%  Intake/Output Summary (Last 24 hours) at 08/14/14 1101 Last data filed at 08/14/14 0859  Gross per 24 hour  Intake    770 ml  Output    775 ml  Net     -5 ml    PHYSICAL EXAM General: Thin, cachectic female in NAD. Alert and oriented x 3.  Psych:  Good affect, responds appropriately Neck: No JVD. No masses noted.  Lungs: Clear bilaterally with no wheezes or rhonci noted.  Heart: RRR with systolic murmur noted.  Abdomen: Bowel sounds are present. Soft, non-tender.  Extremities: No lower extremity edema.   LABS: Basic Metabolic Panel:  Recent Labs  08/12/14 0917 08/13/14 0241 08/14/14 0853  NA 129* 126* 129*  K 4.6 4.1 4.5  CL 94* 94* 96*  CO2 25 25 24   GLUCOSE 103* 125* 137*  BUN 35* 30* 27*  CREATININE 2.80* 2.56* 2.45*  CALCIUM 8.9 8.8* 9.0  PHOS 4.7*  --   --    Current Meds: . amiodarone  200 mg Oral BID  . aspirin EC  81 mg Oral Daily  . carvedilol  3.125 mg Oral BID WC  . clopidogrel  75 mg Oral Q breakfast  . darbepoetin (ARANESP) injection - NON-DIALYSIS  100 mcg Subcutaneous Q Tue-1800  . docusate sodium  100 mg Oral BID  . ferric gluconate (FERRLECIT/NULECIT) IV  250 mg Intravenous Daily  . furosemide  80 mg Oral BID  . polyethylene glycol  17 g Oral BID  . potassium chloride  20 mEq Oral Daily  . pravastatin  80 mg Oral QAC supper    ASSESSMENT AND PLAN: 81 y/o female with a history of CAD s/p CABG (09/2008, LIMA to LAD and free RIMA Y graft to OM off of LIMA with ortic root replacement) ischemic cardiomyopathy (30% to 35%, 09/9831), chronic systolic CHF, HTN, HLD, tobacco abuse, COPD on 2L oxygen, CKD, PAD, and severe mitral regurgitation, readmitted for a/c CHF and chest pain. She has had multiple readmissions for acute HF secondary to her severe mitral  valve disease over the last several months.   1. Acute on Chronic Systolic CHF: Volume status is better. Now back on po Lasix.  LVEF=30% on echo 08/04/2014 and unchanged compared to prior. Weight back to admission level 132 lbs. Continue Coreg and Lasix.   2. CAD: h/o CABG in 2010. Cath 08/06/2014 patent DES to prox RCA, 70% prox to mid LCx, 50% prox to mid LAD, 90% OM1, 75% D1, EF 15-20%. Severe MR. No PCI planned.    3. Severe Mitral Valve Regurgitation:  - Per Dr. Tamala Julian, severe MR likely significant contribution from annular dilatation, however may have possible ischemic component and intrinsic mitral valve dx. Felt with significant deterioration of LV function, impact of mitral clip on symptoms may be minimal  - TEE cancelled as pt went into new afib with RVR after leaving telemetry floor and arriving in Endo. Paroxysms of atrial fib may also play a role with her decompensating episodes. On this basis decided not to proceed with transesophageal echo.   4. Acute on chronic renal insufficiency: Stable. Nephrology following.   5. Newly onset PAF with RVR: Went into PAF on 7/26 somewhere between leaving telemetry floor and arriving  at Endo for TEE, TEE was cancelled as result.  CHA2DS2-Vasc score 6 (female, age, CAD, HF, HTN).  Ccontinue amiodarone 200mg  BID, likely can transition to 200mg  daily in 1 month  OK to d/c home. She will need f/u in our office in 1-2 weeks.     [name]  79/2/201611:01 AM

## 2014-08-14 NOTE — Progress Notes (Addendum)
[facility] PROGRESS NOTE  [name] DUK:[id number] DOB: Oct 06, 1927 DOA: 08/03/2014 PCP: Leonard Downing, MD  Interim summary 81yo woman with PMH of CAD, systolic CHF, HTN, Heart murmur, COPD, CKD, anemia who presents for acute SOB and chest pressure. found to have acute on chronic systolic/diastolic heart failure with demand ischemia. Patient went into atrial fibrillation with RVR and her course has been complicated with renal failure. Cardiology and renal service on board. Slowly improving and in need of medication adjustment/decision on discharge meds.  Assessment/Plan: 1. Acute on chronic combined systolic and diastolic congestive heart failure. -Patient presenting with clinical signs and symptoms consistent with acute CHF -Her last transthoracic echocardiogram was performed on 04/20/2014 that revealed an EF of 30-35% with diffuse hypokinesis as well as grade 2 diastolic dysfunction. -She was seen and evaluated by cardiology feeling that severe MR contributing to CHF, along with PAF. -Cr is better and plan is to increase dose of lasix and follow trend. Weight is up, but overall Volume status is stable and w/o signs of fluid overload.  -coreg has been resumed (low dose) 8/1; will continue and monitor BP -will follow renal function and discharge on minimal therapy with plans for outpatient adjustment on her meds by cardiology   2.  Coronary artery disease. -Patient had reported having chest pain overnight. Troponins negative. -She had a cardiac catheterizations done on 06/04/2014 and 05/30/2014 showing 85% in-stent restenosis in the proximal RCA, proximal circumflex to mid circumflex 70% stenosis, first marginal lesion 80% stenosis, first on the lesion 90% stenosis in proximal LAD to mid LAD lesion 50% stenosis. She had stenting with drug-eluting stent on 06/04/2014, with plans for staged PCI given chronic kidney disease.  -Cardiology felt that circumflex and first marginal  stenosis could be causing ischemic MR -Patient undergoing cardiac catheterization on 08/06/2014 showing widely patent drug-eluting stent to the proximal RCA, proximal circumflex to mid circumflex lesion 70% stenosis, proximal LAD 2 mid LAD lesion 50% stenosis, first marginal containing ostial 90% stenosis, first diagonal branches containing 75% stenosis.  -Patient found having EF of 15-20%.  -Cardiology recommending continuing medical management -continue b-blocker, ASA and Plavix.  -No CP reported by patient   3.  Atrial fibrillation with rapid ventricular response -On 08/07/2014 she was found to be in A. fib with RVR having ventricular rates in the 150s -per cardiology no anticoagulation candidate (fall risk, chronic anemia and in need of ASA plavix for ischemic CAD with recent stenting) -will continue amiodarone and the use of ASA/plavix -patient in sinus rhythm now -ChadsVasc score 6  4.  Mitral valve regurgitation -Transthoracic echocardiogram from 04/20/2014 showing severe regurgitation -Possibility of mitral clip had been discussed, unclear benefits, TEE had been planned for this admission, but patient experienced A. Fib with RVR and has now been cancelled -will continue controlling volume and arrhyhtmia; no future plans of addressing valve disorder  -will follow cardiology rec's  5. Hypertension -Continue holding Imdur -will continued low dose coreg as recommended by cardiology -watch VS and try to avoid hypotension   6.  Dyslipidemia -Continue pravastatin 80 mg by mouth daily  7. Acute on chronic kidney dysfunction: stage 3-4 at baseline -most likely multifactorial: contrast, hypotension and diuresis -CR improved down to 2.45  -plan is to continue lasix at higher dose (80mg  BID) and follow urine output and renal function trend -appreciate assistance from renal service and rec's   8. chronic anemia: -most likely associated with CKD -last Hgb 9.1 -no signs of overt  bleeding -will  monitor -per renal patient received IV iron   Code Status: Full code Family Communication: Spoke with her daughter was present at bedside Disposition Plan: continue medical therapy; remains inpatient. Renal function has worsen and preventing her to be discharge.    Consultants:  Cardiology  Renal service.  Procedures:  Transthoracic echocardiogram impression: Left ventricle: The cavity size was moderately dilated. Wall thickness was normal. The estimated ejection fraction was 30%. Diffuse hypokinesis. Findings consistent with left ventricular diastolic dysfunction. Doppler parameters are consistent with high ventricular filling pressure. Mildly thickened leaflets . There was severe regurgitation.  HPI/Subjective: Patient denies CP, no fever and with stable breathing. Complaining of nausea/vomiting. Urine output is down. But Cr improving and BP stable  Objective: Filed Vitals:   08/14/14 1749  BP: 118/71  Pulse: 68  Temp:   Resp:     Intake/Output Summary (Last 24 hours) at 08/14/14 1802 Last data filed at 08/14/14 1751  Gross per 24 hour  Intake    660 ml  Output    675 ml  Net    -15 ml   Filed Weights   08/12/14 0640 08/13/14 0446 08/14/14 0558  Weight: 58.922 kg (129 lb 14.4 oz) 60.056 kg (132 lb 6.4 oz) 59.875 kg (132 lb)    Exam:   General:  Feeling nauseous and with couple episodes of vomiting after iron IV given. No CP and endorses breathing is stable. BP is better and Cr improved to 2.45. Patient urine output is down/ decreased   Cardiovascular: positive SEM, no rubs or gallops; no LE edema or JVD on exam  Respiratory: good air movement, no crackles on exam; no wheezing or rhonchi appreciated   Abdomen: Soft, nontender, nondistended  Extremities:  resolution of edema, no cyanosis   Data Reviewed: Basic Metabolic Panel:  Recent Labs Lab 08/10/14 0944 08/11/14 0340 08/12/14 0917 08/13/14 0241 08/14/14 0853  NA  128* 128* 129* 126* 129*  K 4.4 4.5 4.6 4.1 4.5  CL 93* 95* 94* 94* 96*  CO2 25 25 25 25 24   GLUCOSE 108* 82 103* 125* 137*  BUN 30* 33* 35* 30* 27*  CREATININE 2.76* 2.89* 2.80* 2.56* 2.45*  CALCIUM 9.0 8.7* 8.9 8.8* 9.0  PHOS  --   --  4.7*  --   --    CBC:  Recent Labs Lab 08/08/14 0409 08/09/14 0312 08/10/14 0356 08/10/14 1218  WBC 6.3 5.7 5.1 5.6  HGB 9.2* 8.6* 8.3* 9.1*  HCT 29.6* 27.3* 26.5* 29.0*  MCV 81.1 80.1 79.3 79.0  PLT 411* 405* 384 429*   BNP (last 3 results)  Recent Labs  05/22/14 0409 05/29/14 1000 08/03/14 2021  BNP 801.8* 851.1* 2801.4*    Studies: No results found.  Scheduled Meds: . amiodarone  200 mg Oral BID  . aspirin EC  81 mg Oral Daily  . carvedilol  3.125 mg Oral BID WC  . clopidogrel  75 mg Oral Q breakfast  . darbepoetin (ARANESP) injection - NON-DIALYSIS  100 mcg Subcutaneous Q Tue-1800  . docusate sodium  100 mg Oral BID  . ferric gluconate (FERRLECIT/NULECIT) IV  250 mg Intravenous Daily  . furosemide  80 mg Oral BID  . polyethylene glycol  17 g Oral BID  . potassium chloride  20 mEq Oral Daily  . pravastatin  80 mg Oral QAC supper   Continuous Infusions:    Active Problems:   Hyperlipidemia   Essential hypertension   CAD-native   Chronic combined systolic and diastolic heart failure,  NYHA class 2   COPD (chronic obstructive pulmonary disease)   CKD (chronic kidney disease), stage IV   Acute systolic congestive heart failure   Malnutrition of moderate degree   Acute systolic CHF (congestive heart failure), NYHA class 4   Severe mitral regurgitation   Atrial fibrillation with RVR   Paroxysmal atrial fibrillation   CHF exacerbation   Acute on chronic systolic heart failure   Anemia of chronic disease   Acute renal failure superimposed on stage 4 chronic kidney disease   Time spent: 30 min   [facility] Pager [phone]. If 7PM-7AM, please contact night-coverage at [email or web address], password  [facility] 08/14/2014, 6:02 PM  LOS: 11 days

## 2014-08-15 ENCOUNTER — Ambulatory Visit: Payer: Medicare Other | Admitting: Cardiovascular Disease

## 2014-08-15 DIAGNOSIS — I5042 Chronic combined systolic (congestive) and diastolic (congestive) heart failure: Secondary | ICD-10-CM

## 2014-08-15 LAB — RENAL FUNCTION PANEL
Albumin: 2.7 g/dL — ABNORMAL LOW (ref 3.5–5.0)
Anion gap: 8 (ref 5–15)
BUN: 28 mg/dL — ABNORMAL HIGH (ref 6–20)
CHLORIDE: 98 mmol/L — AB (ref 101–111)
CO2: 26 mmol/L (ref 22–32)
CREATININE: 2.54 mg/dL — AB (ref 0.44–1.00)
Calcium: 8.7 mg/dL — ABNORMAL LOW (ref 8.9–10.3)
GFR calc Af Amer: 19 mL/min — ABNORMAL LOW (ref >60)
GFR calc non Af Amer: 16 mL/min — ABNORMAL LOW (ref >60)
GLUCOSE: 74 mg/dL (ref 65–99)
PHOSPHORUS: 4.2 mg/dL (ref 2.5–4.6)
POTASSIUM: 4.6 mmol/L (ref 3.5–5.1)
SODIUM: 132 mmol/L — AB (ref 135–145)

## 2014-08-15 MED ORDER — FUROSEMIDE 80 MG PO TABS
80.0000 mg | ORAL_TABLET | Freq: Two times a day (BID) | ORAL | Status: DC
Start: 2014-08-15 — End: 2014-09-11

## 2014-08-15 MED ORDER — POLYETHYLENE GLYCOL 3350 17 G PO PACK: 17.0000 g | PACK | Freq: Two times a day (BID) | ORAL | Status: DC

## 2014-08-15 MED ORDER — CARVEDILOL 3.125 MG PO TABS: 3.1250 mg | ORAL_TABLET | Freq: Two times a day (BID) | ORAL | Status: DC

## 2014-08-15 MED ORDER — AMIODARONE HCL 200 MG PO TABS: 200.0000 mg | ORAL_TABLET | Freq: Two times a day (BID) | ORAL | Status: DC

## 2014-08-15 MED ORDER — POTASSIUM CHLORIDE CRYS ER 10 MEQ PO TBCR: 20.0000 meq | EXTENDED_RELEASE_TABLET | Freq: Every day | ORAL | Status: DC

## 2014-08-15 NOTE — Progress Notes (Signed)
SUBJECTIVE: No SOB this am  BP 103/50 mmHg  Pulse 67  Temp(Src) 98.2 F (36.8 C) (Oral)  Resp 18  Ht 5\' 6"  (1.676 m)  Wt 129 lb 9.6 oz (58.786 kg)  BMI 20.93 kg/m2  SpO2 98%  Intake/Output Summary (Last 24 hours) at 08/15/14 0902 Last data filed at 08/15/14 0600  Gross per 24 hour  Intake    960 ml  Output    900 ml  Net     60 ml    PHYSICAL EXAM General: Thin, cachectic female in NAD. Alert and oriented x 3.  Psych:  Good affect, responds appropriately Neck: No JVD. No masses noted.  Lungs: Clear bilaterally with no wheezes or rhonci noted.  Heart: RRR with systolic murmur noted.  Abdomen: Bowel sounds are present. Soft, non-tender.  Extremities: No lower extremity edema.   LABS: Basic Metabolic Panel:  Recent Labs  08/12/14 0917  08/14/14 0853 08/15/14 0414  NA 129*  < > 129* 132*  K 4.6  < > 4.5 4.6  CL 94*  < > 96* 98*  CO2 25  < > 24 26  GLUCOSE 103*  < > 137* 74  BUN 35*  < > 27* 28*  CREATININE 2.80*  < > 2.45* 2.54*  CALCIUM 8.9  < > 9.0 8.7*  PHOS 4.7*  --   --  4.2  < > = values in this interval not displayed. Current Meds: . amiodarone  200 mg Oral BID  . aspirin EC  81 mg Oral Daily  . carvedilol  3.125 mg Oral BID WC  . clopidogrel  75 mg Oral Q breakfast  . darbepoetin (ARANESP) injection - NON-DIALYSIS  100 mcg Subcutaneous Q Tue-1800  . docusate sodium  100 mg Oral BID  . ferric gluconate (FERRLECIT/NULECIT) IV  250 mg Intravenous Daily  . furosemide  80 mg Oral BID  . polyethylene glycol  17 g Oral BID  . potassium chloride  20 mEq Oral Daily  . pravastatin  80 mg Oral QAC supper    ASSESSMENT AND PLAN: 81 y/o female with a history of CAD s/p CABG (09/2008, LIMA to LAD and free RIMA Y graft to OM off of LIMA with ortic root replacement) ischemic cardiomyopathy (30% to 35%, 02/6946), chronic systolic CHF, HTN, HLD, tobacco abuse, COPD on 2L oxygen, CKD, PAD, and severe mitral regurgitation, readmitted for a/c CHF and chest pain. She  has had multiple readmissions for acute HF secondary to her severe mitral valve disease over the last several months.   1. Acute on Chronic Systolic CHF: Volume status is better. Now back on po Lasix.  LVEF=30% on echo 08/04/2014 and unchanged compared to prior. Weight 129- down 3 lbs from adm.   2. CAD: h/o CABG x 2 in 2010, native RCA DES 06/04/14 Cath 08/06/2014 patent DES to prox RCA, patent LIMA and RIMA grafts, 70% prox to mid LCx, 50% prox to mid LAD, 90% OM1, 75% D1, EF 15-20%. Severe MR- med Rx.     3. Severe Mitral Valve Regurgitation:  Not felt to be a surgical candidate  4. Acute on chronic renal insufficiency: Increase SCr from baseline-Nephrology following.   5. Newly onset PAF with RVR: Went into PAF on 7/26- converted with IV Amiodarone. CHA2DS2-Vasc score 6 (female, age, CAD, HF, HTN), not felt to be a candidate for Coumadin secondary to anemia. Continue amiodarone 200mg  BID, transition to 200mg  daily in 1 month  OK to d/c home from  our standpoint. We will arrange TOC f/u in our office in 1-2 weeks.    [name] PA-C 08/15/2014 9:08 AM  I have personally seen and examined this patient with [name], PA-C. I agree with the assessment and plan as outlined above. Her volume status is better. She is in sinus. Renal function is stable. OK to d/c home with close outpatient f/u in our office.   Jahfari Ambers 08/15/2014 9:54 AM

## 2014-08-15 NOTE — Progress Notes (Signed)
SATURATION QUALIFICATIONS: (This note is used to comply with regulatory documentation for home oxygen)  Patient Saturations on Room Air at Rest = 90 %  Patient Saturations on Room Air while Ambulating = 82%  Patient Saturations on 2Liters of oxygen while Ambulating = 96%  Please briefly explain why patient needs home oxygen:desaturates while walking  Needs 0 2 l/m [place] while walking

## 2014-08-15 NOTE — Progress Notes (Signed)
Subjective:  Looks well. breathing better  Had better UOP on increased lasix- creatinine stable- says is ready to go home  Objective Vital signs in last 24 hours: Filed Vitals:   08/14/14 1523 08/14/14 1749 08/14/14 2023 08/15/14 0529  BP: 108/59 118/71 107/55 103/50  Pulse: 64 68 70 67  Temp: 97.3 F (36.3 C)  97.5 F (36.4 C) 98.2 F (36.8 C)  TempSrc: Oral  Oral Oral  Resp: 18  18 18   Height:      Weight:    58.786 kg (129 lb 9.6 oz)  SpO2: 100%  91% 98%   Weight change: -1.089 kg (-2 lb 6.4 oz)  Intake/Output Summary (Last 24 hours) at 08/15/14 0916 Last data filed at 08/15/14 0900  Gross per 24 hour  Intake   1320 ml  Output    900 ml  Net    420 ml    Assessment/ Plan: Pt is a 81 y.o. yo female with known CKD and CHF (EF 30%) who was admitted on 08/03/2014 with CHF- multiple hosps for same.  Also had worsening of CKD this admit  Assessment/Plan: 1. Renal- A on CRF this hosp thought to be due to decreased renal perfusion. Baseline renal function in the high 1's.  Diuretics had been held initially  but now hydalazine stopped - only on low dose coreg to increase renal perfusion. Creatinine is stable- may be new baseline.  Patient has said and confirms that she would not do dialysis under any circumstances 2. Volume- after lasix held- weight went up and sodium  down- lasix restarted and increased to 80 BID- would send her home on this.  Low dose coreg restarted as well 3. Anemia- low /stable- needs iron- will give- but had reaction ?? Will not give again today  and also giving aranesp here in hosp- will not need as OP 4. Dispo- pt states that she would not do dialysis under any circumstances- followed by Dr. Marval Regal as OP- needs increased mobility 4. Hypokalemia- on repletion-  is appropriate-   Renal will sign off- anticipate discharge today- I will order labs through our office for next week and make sure she has appt to follow up with Dr. Nadeen Landau  A    Labs: Basic Metabolic Panel:  Recent Labs Lab 08/12/14 0917 08/13/14 0241 08/14/14 0853 08/15/14 0414  NA 129* 126* 129* 132*  K 4.6 4.1 4.5 4.6  CL 94* 94* 96* 98*  CO2 25 25 24 26   GLUCOSE 103* 125* 137* 74  BUN 35* 30* 27* 28*  CREATININE 2.80* 2.56* 2.45* 2.54*  CALCIUM 8.9 8.8* 9.0 8.7*  PHOS 4.7*  --   --  4.2   Liver Function Tests:  Recent Labs Lab 08/12/14 0917 08/15/14 0414  ALBUMIN 3.0* 2.7*   No results for input(s): LIPASE, AMYLASE in the last 168 hours. No results for input(s): AMMONIA in the last 168 hours. CBC:  Recent Labs Lab 08/09/14 0312 08/10/14 0356 08/10/14 1218  WBC 5.7 5.1 5.6  HGB 8.6* 8.3* 9.1*  HCT 27.3* 26.5* 29.0*  MCV 80.1 79.3 79.0  PLT 405* 384 429*   Cardiac Enzymes: No results for input(s): CKTOTAL, CKMB, CKMBINDEX, TROPONINI in the last 168 hours. CBG: No results for input(s): GLUCAP in the last 168 hours.  Iron Studies:   Recent Labs  08/14/14 0408  IRON 17*  TIBC 395  FERRITIN 31   Studies/Results: No results found. Medications: Infusions:    Scheduled Medications: . amiodarone  200 mg Oral BID  . aspirin EC  81 mg Oral Daily  . carvedilol  3.125 mg Oral BID WC  . clopidogrel  75 mg Oral Q breakfast  . darbepoetin (ARANESP) injection - NON-DIALYSIS  100 mcg Subcutaneous Q Tue-1800  . docusate sodium  100 mg Oral BID  . ferric gluconate (FERRLECIT/NULECIT) IV  250 mg Intravenous Daily  . furosemide  80 mg Oral BID  . polyethylene glycol  17 g Oral BID  . potassium chloride  20 mEq Oral Daily  . pravastatin  80 mg Oral QAC supper    have reviewed scheduled and prn medications.  Physical Exam: General: alert, talkative Heart: RRR Lungs: mostly clear Abdomen: soft, non tender Extremities: no peripheral edema- but possibly sacral    08/15/2014,9:16 AM  LOS: 12 days

## 2014-08-15 NOTE — Discharge Summary (Signed)
Physician Discharge Summary  [name]:[id number] DOB: [birth year] DOA: 08/03/2014  PCP: Leonard Downing, MD  Admit date: 08/03/2014 Discharge date: 08/15/2014  Time spent: 35 minutes  Recommendations for Outpatient Follow-up:  1. Needs bmet to follow renal function.  2. Follow up with cardiology for further adjustment of medication.   Discharge Diagnoses:    Acute on chronic systolic heart failure   Acute renal failure superimposed on stage 4 chronic kidney disease   Anemia of chronic disease   Hyperlipidemia   Essential hypertension   CAD-native   Chronic combined systolic and diastolic heart failure, NYHA class 2   COPD (chronic obstructive pulmonary disease)   CKD (chronic kidney disease), stage IV   Acute systolic congestive heart failure   Malnutrition of moderate degree   Acute systolic CHF (congestive heart failure), NYHA class 4   Severe mitral regurgitation   Atrial fibrillation with RVR   Paroxysmal atrial fibrillation   CHF exacerbation     Discharge Condition: stable.   Diet recommendation: heart healthy  Filed Weights   08/13/14 0446 08/14/14 0558 08/15/14 0529  Weight: 60.056 kg (132 lb 6.4 oz) 59.875 kg (132 lb) 58.786 kg (129 lb 9.6 oz)    History of present illness:  Ms. [name] is an 81yo woman with PMH of CAD, systolic CHF, HTN, Heart murmur, COPD, CKD, anemia who presents for acute SOB and chest pressure. She notes that she has been SOB all of today. She is usually SOB when lying flat, but noticed that she was now SOB with sitting up and activity. She has also noted increased swelling in the legs which will not go down. She has had normal urinary output and notes no weight gain, but weight loss. She noticed no trigger, has not had a change in diet and has been taking her medications as prescribed. The chest pressure she notes as a sensation of not being able to draw a deep breath b/c something is pushing on her sternum. It is substernal  and right sided, no radiation, came on at rest. + nausea all day per her. She denies dizziness, lightheadedness, syncope, radiation of chest pain, diaphoresis. Further symptoms include chronic pain and swelling in her legs, burning pain in her feet due to the swelling and varicose veins. She denied vomiting diarrhea, fever, chills, abdominal pain.   In the ED she has had a CXR which showed mild CHF, cardiomegaly, worsening right sided pleural effusion and no consolidation (no other signs of infection currently, no cough, sputum). She had a BNP which was elevated to 2800 and a TnI of 0.03. Last TTE in April showed EF of 35% and grade 2 diastolic dysfunction. She was given IV lasix 40mg  with good urination.   Her last admission was in May of this year for chest pain. She had a LHC and a stent placed. She is on dual antiplatelet therapy. During that admission, she also was noted to have LAD on a CT chest and she needs a repeat study.   Hospital Course:  81yo woman with PMH of CAD, systolic CHF, HTN, Heart murmur, COPD, CKD, anemia who presents for acute SOB and chest pressure. found to have acute on chronic systolic/diastolic heart failure with demand ischemia. Patient went into atrial fibrillation with RVR and her course has been complicated with renal failure. Cardiology and renal service on board. Slowly improving and in need of medication adjustment/decision on discharge meds.  Assessment/Plan: 1. Acute on chronic combined systolic and diastolic congestive heart  failure. -Patient presenting with clinical signs and symptoms consistent with acute CHF -Her last transthoracic echocardiogram was performed on 04/20/2014 that revealed an EF of 30-35% with diffuse hypokinesis as well as grade 2 diastolic dysfunction. -She was seen and evaluated by cardiology feeling that severe MR contributing to CHF, along with PAF. -coreg has been resumed (low dose) 8/1; will continue and monitor BP -will  follow renal function and discharge on minimal therapy with plans for outpatient adjustment on her meds by cardiology  -discharge on lasix 80 mg BID.   2. Coronary artery disease. -Patient had reported having chest pain overnight. Troponins negative. -She had a cardiac catheterizations done on 06/04/2014 and 05/30/2014 showing 85% in-stent restenosis in the proximal RCA, proximal circumflex to mid circumflex 70% stenosis, first marginal lesion 80% stenosis, first on the lesion 90% stenosis in proximal LAD to mid LAD lesion 50% stenosis. She had stenting with drug-eluting stent on 06/04/2014, with plans for staged PCI given chronic kidney disease.  -Cardiology felt that circumflex and first marginal stenosis could be causing ischemic MR -Patient undergoing cardiac catheterization on 08/06/2014 showing widely patent drug-eluting stent to the proximal RCA, proximal circumflex to mid circumflex lesion 70% stenosis, proximal LAD 2 mid LAD lesion 50% stenosis, first marginal containing ostial 90% stenosis, first diagonal branches containing 75% stenosis.  -Patient found having EF of 15-20%.  -Cardiology recommending continuing medical management -continue b-blocker, ASA and Plavix.  -No CP reported by patient   3. Atrial fibrillation with rapid ventricular response -On 08/07/2014 she was found to be in A. fib with RVR having ventricular rates in the 150s -per cardiology no anticoagulation candidate (fall risk, chronic anemia and in need of ASA plavix for ischemic CAD with recent stenting) -will continue amiodarone and the use of ASA/plavix -patient in sinus rhythm now -ChadsVasc score 6  4. Mitral valve regurgitation -Transthoracic echocardiogram from 04/20/2014 showing severe regurgitation -Possibility of mitral clip had been discussed, unclear benefits, TEE had been planned for this admission, but patient experienced A. Fib with RVR and has now been cancelled -will continue controlling  volume and arrhyhtmia; no future plans of addressing valve disorder  -will follow cardiology rec's  5. Hypertension -Continue holding Imdur -will continued low dose coreg as recommended by cardiology -watch VS and try to avoid hypotension   6. Dyslipidemia -Continue pravastatin 80 mg by mouth daily  7. Acute on chronic kidney dysfunction: stage 3-4 at baseline -most likely multifactorial: contrast, hypotension and diuresis -CR improved down to 2.45  -plan is to continue lasix at higher dose (80mg  BID) and follow urine output and renal function trend -appreciate assistance from renal service and rec's  -patient refuse dialysis  8. Chronic anemia: -most likely associated with CKD -last Hgb 9.1 -no signs of overt bleeding -will monitor -per renal patient received IV iron  Procedures:    Consultations:  cardiology  Discharge Exam: Filed Vitals:   08/15/14 0529  BP: 103/50  Pulse: 67  Temp: 98.2 F (36.8 C)  Resp: 18    General: NAD Cardiovascular: S 1, S 2 RRR Respiratory: crackles bases  Discharge Instructions   Discharge Instructions    Diet - low sodium heart healthy    Complete by:  As directed      Increase activity slowly    Complete by:  As directed           Current Discharge Medication List    START taking these medications   Details  amiodarone (PACERONE) 200 MG tablet  Take 1 tablet (200 mg total) by mouth 2 (two) times daily. Qty: 60 tablet, Refills: 0    carvedilol (COREG) 3.125 MG tablet Take 1 tablet (3.125 mg total) by mouth 2 (two) times daily with a meal. Qty: 60 tablet, Refills: 0    polyethylene glycol (MIRALAX / GLYCOLAX) packet Take 17 g by mouth 2 (two) times daily. Qty: 14 each, Refills: 0      CONTINUE these medications which have CHANGED   Details  furosemide (LASIX) 80 MG tablet Take 1 tablet (80 mg total) by mouth 2 (two) times daily. Qty: 60 tablet, Refills: 0    potassium chloride (K-DUR,KLOR-CON) 10 MEQ tablet  Take 2 tablets (20 mEq total) by mouth daily. Qty: 30 tablet, Refills: 0      CONTINUE these medications which have NOT CHANGED   Details  acetaminophen (TYLENOL) 325 MG tablet Take 650 mg by mouth daily as needed (pain).    albuterol (PROVENTIL HFA;VENTOLIN HFA) 108 (90 BASE) MCG/ACT inhaler Inhale 2 puffs into the lungs every 4 (four) hours as needed for wheezing or shortness of breath.     aspirin EC 81 MG tablet Take 81 mg by mouth daily.    clopidogrel (PLAVIX) 75 MG tablet Take 1 tablet (75 mg total) by mouth daily with breakfast. Qty: 30 tablet, Refills: 6    Coenzyme Q-10 100 MG capsule Take 100 mg by mouth daily at 2 PM daily at 2 PM.     nitroGLYCERIN (NITROSTAT) 0.4 MG SL tablet Place 1 tablet (0.4 mg total) under the tongue every 5 (five) minutes as needed. Qty: 25 tablet, Refills: 6    pravastatin (PRAVACHOL) 80 MG tablet TAKE ONE TABLET BY MOUTH ONCE DAILY IN THE EVENING Qty: 90 tablet, Refills: 3   Associated Diagnoses: Pure hypercholesterolemia      STOP taking these medications     hydrALAZINE (APRESOLINE) 10 MG tablet      isosorbide mononitrate (IMDUR) 30 MG 24 hr tablet      metoprolol tartrate (LOPRESSOR) 25 MG tablet        No Known Allergies Follow-up Information    Follow up with Melina Copa, PA-C On 08/29/2014.   Specialties:  Cardiology, Radiology   Why:  10:30   Contact information:   [address] [place] [address] [phone]        The results of significant diagnostics from this hospitalization (including imaging, microbiology, ancillary and laboratory) are listed below for reference.    Significant Diagnostic Studies: Dg Chest 2 View  08/10/2014   CLINICAL DATA:  Congestive heart failure  EXAM: CHEST  2 VIEW  COMPARISON:  August 05, 2014  FINDINGS: There is cardiomegaly with mild pulmonary venous hypertension. There are bilateral pleural effusions with mild mid and lower lung zone interstitial edema. No  airspace consolidation. Patient is status post internal mammary bypass grafting. There is a fairly sizable hiatal hernia.  IMPRESSION: Evidence of persistent congestive heart failure. No airspace consolidation. Hiatal hernia.   Electronically Signed   By: Lowella Grip III M.D.   On: 08/10/2014 12:02   Dg Chest 2 View  08/05/2014   CLINICAL DATA:  Pleural effusion, shortness of breath  EXAM: CHEST  2 VIEW  COMPARISON:  08/03/2014  FINDINGS: Moderate enlargement of the cardiac silhouette noted. Hyperinflation and coarsened interstitial opacities suggest emphysema with possible superimposed interstitial edema. Bilateral hilar prominence with tapering may suggest pulmonary arterial hypertension. Small pleural effusions. Bones are subjectively osteopenic. Patchy bibasilar opacities  are most likely compressive atelectasis. Atheromatous aortic calcification noted.  IMPRESSION: Probable emphysematous change with cardiomegaly and possible superimposed interstitial edema.  Small pleural effusions.   Electronically Signed   By: Conchita Paris M.D.   On: 08/05/2014 09:55   Dg Chest 2 View  08/03/2014   CLINICAL DATA:  Former smoker with current history of oxygen dependent COPD, presenting with left-sided chest pain and paresthesias. Prior CABG.  EXAM: CHEST  2 VIEW  COMPARISON:  06/14/2014 and earlier, including CT chest 06/05/2014.  FINDINGS: Prior sternotomy CABG. Cardiac silhouette markedly enlarged but stable. Prominent right pulmonary artery accounting for the opacity in the right hilum as identified on the prior CT. Large hiatal hernia, unchanged. Mild diffuse interstitial pulmonary edema with Kerley B-lines, new since the most recent examination 06/14/2014. Interval increase in size of a now moderately large right pleural effusion. Stable small left pleural effusion. New consolidation in the anterior right lower lobe. Osseous demineralization, thoracolumbar scoliosis, and exaggeration of the usual thoracic  kyphosis.  IMPRESSION: 1. Mild CHF, with stable marked cardiomegaly and mild diffuse interstitial pulmonary edema. 2. Moderately large right pleural effusion, increased since 06/14/2014. Stable small left pleural effusion. (The patient has chronic bilateral effusions. ) 3. New consolidation in the anterior right lower lobe, passive atelectasis versus pneumonia. 4. Stable large hiatal hernia.   Electronically Signed   By: Evangeline Dakin M.D.   On: 08/03/2014 20:17    Microbiology: No results found for this or any previous visit (from the past 240 hour(s)).   Labs: Basic Metabolic Panel:  Recent Labs Lab 08/11/14 0340 08/12/14 0917 08/13/14 0241 08/14/14 0853 08/15/14 0414  NA 128* 129* 126* 129* 132*  K 4.5 4.6 4.1 4.5 4.6  CL 95* 94* 94* 96* 98*  CO2 25 25 25 24 26   GLUCOSE 82 103* 125* 137* 74  BUN 33* 35* 30* 27* 28*  CREATININE 2.89* 2.80* 2.56* 2.45* 2.54*  CALCIUM 8.7* 8.9 8.8* 9.0 8.7*  PHOS  --  4.7*  --   --  4.2   Liver Function Tests:  Recent Labs Lab 08/12/14 0917 08/15/14 0414  ALBUMIN 3.0* 2.7*   No results for input(s): LIPASE, AMYLASE in the last 168 hours. No results for input(s): AMMONIA in the last 168 hours. CBC:  Recent Labs Lab 08/09/14 0312 08/10/14 0356 08/10/14 1218  WBC 5.7 5.1 5.6  HGB 8.6* 8.3* 9.1*  HCT 27.3* 26.5* 29.0*  MCV 80.1 79.3 79.0  PLT 405* 384 429*   Cardiac Enzymes: No results for input(s): CKTOTAL, CKMB, CKMBINDEX, TROPONINI in the last 168 hours. BNP: BNP (last 3 results)  Recent Labs  05/22/14 0409 05/29/14 1000 08/03/14 2021  BNP 801.8* 851.1* 2801.4*    ProBNP (last 3 results) No results for input(s): PROBNP in the last 8760 hours.  CBG: No results for input(s): GLUCAP in the last 168 hours.     SignedNiel Hummer A  [facility] 08/15/2014, 10:44 AM

## 2014-08-15 NOTE — Care Management Note (Signed)
Case Management Note  Patient Details  Name: [name] MRN: [id number] Date of Birth: 1927/09/30  Subjective/Objective:       CHF             Action/Plan: Home Health   Expected Discharge Date:  08/15/2014               Expected Discharge Plan:  [place]  In-House Referral:     Discharge planning Services  CM Consult  Post Acute Care Choice:  Home Health Choice offered to:  Patient    HH Arranged:  RN [facility] Agency:  [facility]  Status of Service:  Completed, signed off  Medicare Important Message Given:  Yes-fourth notification given Date Medicare IM Given:    Medicare IM give by:    Date Additional Medicare IM Given:    Additional Medicare Important Message give by:     If discussed at [place] of Stay Meetings, dates discussed:    Additional Comments: NCM spoke to pt and gave permission to speak to dtr, [name] regarding medication. Pt states her copays varying from $4 to $12 depending on medication. States she has so many medications that the cost is a strain financially because she is on a fix income. States she can receive her medications at a lower rate through mail order pharmacy. NCM encouraged pt to contact mail order pharmacy to obtain medications. Provided pt with info for [facility] that offers assistance for copay cost for Medicare pt that qualify for program.  Dtr will contact toll free number to get application to complete. Erenest Rasher, RN 08/15/2014, 11:38 AM

## 2014-08-16 NOTE — Progress Notes (Signed)
Pt has oxygen at home, at time of dc dtr did bring her portable tank for dc. Jonnie Finner RN CCM Case Mgmt phone [phone]

## 2014-08-17 ENCOUNTER — Telehealth (HOSPITAL_COMMUNITY): Payer: Self-pay | Admitting: Vascular Surgery

## 2014-08-17 NOTE — Telephone Encounter (Signed)
Called pt to make appt.. Line is busy will try back later

## 2014-08-28 ENCOUNTER — Encounter: Payer: Self-pay | Admitting: Physician Assistant

## 2014-08-28 NOTE — Progress Notes (Signed)
Cardiology Office Note Date:  08/29/2014  Patient ID:  [name], [name] 08/09/1927, MRN [id number] PCP:  Kaleen Mask, MD  Cardiologist: Dr. Clifton James  Chief Complaint: f/u hospitalization for CHF  History of Present Illness: [name] is a 81 y.o. female with history of CAD (STEMI 2010 s/p BMS-RCA, CABG 09/2008 with LIMA to LAD and free RIMA Y graft to OM off of LIMA with aortic root replacement, recent DES to prox RCA 06/04/14) ischemic cardiomyopathy (30% to 35%, 04/2014), chronic combined CHF, HTN, HLD, tobacco abuse, COPD with chronic respiratory failure on 2L oxygen, CKD IV (born with 1 kidney), PAD, anemia, LBBB, and severe mitral regurgitation who presents for post-hospital follow-up. She was recently readmitted for a/c CHF and chest pain 7/22-8/3. She has had multiple readmissions for acute HF secondary to her severe mitral valve disease over the last several months. S/p PCI in 05/2014 was above. Also had chest CT around that time showing mediastinal lymphadenopathy with recommendation for f/u CT 4-6 weeks. Echo 08/04/14: EF 30%, diffuse HK, mild AS, mild AI, severe MR, severe ALE, mildly dilated RV/mildly reduced RV systolic function, mild RAE, mild-mod TR, PASP 35. She was initially diuresed with IV Lasix. She had a minimal bump in troponin to 0.04. Due to her MR she was sent for cath to reassess for ischemically mediated valvular disease. This was performed 08/06/14 showing:     Severe mitral regurgitation likely multifactorial related to annular dilatation, possible ischemic component (severe OM1 disease), and intrinsic mitral valve disease.   Severe left ventricular dilatation and systolic failure with an estimated ejection fraction of 15-20%. This appears worse than recent echocardiographic estimates.   Widely patent right coronary artery stent (placed May 2016).   Widely patent LIMA to LAD and man-made Y graft to obtuse marginal #2.   Severe native circumflex  disease with proximal to mid 70% stenosis and 90% stenosis in the ostium of the first obtuse marginal.   Moderately severe branch of the first diagonal with 70% stenosis noted. Appears improved compared to May 2016.  Normal pulmonary artery pressure with V wave present during capillary wedge recording but not significantly elevated due to recent diuresis and increased left atrial compliance.  Dr. Katrinka Blazing felt that in absence of unstable anginal pattern and with severe systolic dysfunction, intervention on circumflex marginal #1 was considered questionable and potentially harmful. Then TEE was considered to further assess her MR, but she was noted to go into new onset atrial fibrillation so this was cancelled. She was started on amiodarone. Given chronic anemia and need for DAPT with recent stents in 05/2014, she was not felt to be a candidate for anticoagulation at this time. Post-cath she was noted to have AKI likely related to hypotension and CIN. Nephrology consulted and held antihypertensives and hydrated the patient. Dr. Clifton James (patient's primary cardiologist) also saw the patient this admission and agreed with plan for no anticoagulation, and he also did not feel she was a surgical candidate for invasive procedures on her mitral valve. D/c weight 129lb. Last BMET showed Na 132, BUN 28/Cr 2.54.Hgb 9.1 (earlier this year was as high as 13-14).  Based on the above findings I was afraid I would meet a woman who was in poor shape in clinic today. Instead I was met by a lively elderly woman reporting she has felt much better since discharge. She denies any CP, dyspnea, nausea, vomiting, LEE. Says she was able to wean off oxygen at home with home health coming  out - 97% RA in clinic today. Weight is down 8 lbs since discharge. She reports her appetite is poor but she says this is a longstanding issue. She can't stand the taste of appetite supplements like Boost, Ensure, etc. Dr. Angelena Form wants her referred to  the CHF clinic, but they had been unable to reach her to schedule this appt. She says she had labwork drawn to recheck her kidney function just a few days ago at Dr. Birdie Riddle office.   Past Medical History  Diagnosis Date  . CAD (coronary artery disease)     a. 07/2008 Inf STEMI->RCA BMS;  b. 09/2008 CABGx2: LIMA->LAD, RIMA->OM as Y from LIMA;  c. s/p DES to prox RCA 05/2014, severe OM lesion being treated medically.  . Ischemic cardiomyopathy     a. EF 15-20% in 08/2014, prev 35-40% range.  . Chronic systolic CHF (congestive heart failure)     a. 06/2011 EF 40%  . Hypertension   . Hyperlipidemia   . Hiatal hernia     large  . Tobacco abuse   . Cerebrovascular disease   . COPD (chronic obstructive pulmonary disease)     a. on home O2.  . Arterial disease     peripheral arterial disease with claudication - bilateral SFA disease with ABI's 0.4-0.5 bilaterally   . Mitral regurgitation     a. Severe MR, not felt to be a surgical candidate.  . CKD (chronic kidney disease), stage IV     born with 1 kidney  . Thyroid nodule 10/2012    left dominant nodule cytology:non-neoplastic goiter.   . Cataracts, both eyes     not surgical  . Personal history of colonic polyps - adenomas 05/09/2013  . Anemia   . Hypertensive renal disease   . Hypertensive heart disease with congestive heart failure   . Aorta aneurysm   . LVH (left ventricular hypertrophy)   . Bronchiectasis   . Atherosclerosis of abdominal aorta   . Chronic respiratory failure   . PAF (paroxysmal atrial fibrillation)     a. Dx 08/2014, not a candidate for anticoag due to anemia and need for DAPT - can reconsider once she comes off Plavix.    Past Surgical History  Procedure Laterality Date  . Laprotomy for bowel obstruction    . Coronary artery bypass graft  09/2008    aortic thoraic anuerysm resecton 2010.   Marland Kitchen Coronary stent placement  07/2008    BMS to RCA  . Flexible sigmoidoscopy N/A 05/09/2013    Procedure: FLEXIBLE  SIGMOIDOSCOPY;  Surgeon: [name], MD;  Location: [facility];  Service: Endoscopy;  Laterality: N/A;  with possible hemorrhoid banding use gastrosope unsdeated  . Appendectomy    . Oophorectomy      one removed in 70's  . Hemorrhoid surgery    . Colonoscopy N/A 05/12/2013    Procedure: COLONOSCOPY;  Surgeon: [name], MD;  Location: [facility];  Service: Endoscopy;  Laterality: N/A;  . Cardiac catheterization N/A 05/30/2014    Procedure: Right/Left Heart Cath and Coronary/Graft Angiography;  Surgeon: Peter M Martinique, MD;  Location: [place] [facility];  Service: Cardiovascular;  Laterality: N/A;  . Cardiac catheterization N/A 06/04/2014    Procedure: Coronary Stent Intervention;  Surgeon: Belva Crome, MD;  Location: [place] [facility];  Service: Cardiovascular;  Laterality: N/A;  . Cardiac catheterization N/A 08/06/2014    Procedure: Left Heart Cath and Coronary Angiography;  Surgeon: Belva Crome, MD;  Location: [facility]  INVASIVE [facility];  Service: Cardiovascular;  Laterality: N/A;    Current Outpatient Prescriptions  Medication Sig Dispense Refill  . acetaminophen (TYLENOL) 325 MG tablet Take 650 mg by mouth daily as needed (pain).    Marland Kitchen albuterol (PROVENTIL HFA;VENTOLIN HFA) 108 (90 BASE) MCG/ACT inhaler Inhale 2 puffs into the lungs every 4 (four) hours as needed for wheezing or shortness of breath.     Marland Kitchen amiodarone (PACERONE) 200 MG tablet Take 1 tablet (200 mg total) by mouth 2 (two) times daily. 60 tablet 0  . aspirin EC 81 MG tablet Take 81 mg by mouth daily.    . carvedilol (COREG) 3.125 MG tablet Take 1 tablet (3.125 mg total) by mouth 2 (two) times daily with a meal. 60 tablet 0  . clopidogrel (PLAVIX) 75 MG tablet Take 1 tablet (75 mg total) by mouth daily with breakfast. 30 tablet 6  . Coenzyme Q-10 100 MG capsule Take 100 mg by mouth daily at 2 PM daily at 2 PM.     . furosemide (LASIX) 80 MG tablet Take 1 tablet (80 mg total) by mouth 2 (two) times daily. 60 tablet 0  .  nitroGLYCERIN (NITROSTAT) 0.4 MG SL tablet Place 1 tablet (0.4 mg total) under the tongue every 5 (five) minutes as needed. 25 tablet 6  . polyethylene glycol (MIRALAX / GLYCOLAX) packet Take 17 g by mouth 2 (two) times daily. 14 each 0  . potassium chloride (K-DUR,KLOR-CON) 10 MEQ tablet Take 2 tablets (20 mEq total) by mouth daily. 30 tablet 0  . pravastatin (PRAVACHOL) 80 MG tablet TAKE ONE TABLET BY MOUTH ONCE DAILY IN THE EVENING (Patient taking differently: Take 80 mg by mouth daily. ) 90 tablet 3   No current facility-administered medications for this visit.    Allergies:   Review of patient's allergies indicates no known allergies.   Social History:  The patient  reports that she quit smoking about 6 years ago. Her smoking use included Cigarettes. She has a 60 pack-year smoking history. She has never used smokeless tobacco. She reports that she does not drink alcohol or use illicit drugs.   Family History:  The patient's family history includes Coronary artery disease in her brother and mother; Heart attack in her sister; Heart disease in her mother; Heart failure in her mother; Kidney cancer in her brother; Leukemia in her father, sister, and sister. There is no history of Colon cancer, Pancreatic cancer, Stomach cancer, Rectal cancer, Stroke, or Hypertension.  ROS:  Please see the history of present illness.    All other systems are reviewed and otherwise negative.   PHYSICAL EXAM:  VS:  BP 110/50 mmHg  Pulse 61  Ht $R'5\' 6"'Iq$  (1.676 m)  Wt 121 lb (54.885 kg)  BMI 19.54 kg/m2  SpO2 97% BMI: Body mass index is 19.54 kg/(m^2). Well nourished, well developed thin WF, in no acute distress HEENT: normocephalic, atraumatic Neck: no JVD, carotid bruits or masses Cardiac:  normal S1, S2; RRR; 3/6 SEM at apex Lungs:  clear to auscultation bilaterally, no wheezing, rhonchi or rales Abd: soft, nontender, no hepatomegaly, + BS MS: no deformity or atrophy Ext: no edema; right groin cath site  without hematoma, ecchymosis or bruit Skin: warm and dry, no rash Neuro:  moves all extremities spontaneously, no focal abnormalities noted, follows commands Psych: euthymic mood, full affect   EKG:  Done today shows NSR 61bpm left axis deviation, LBBB  Recent Labs: 05/22/2014: TSH 2.033 05/31/2014: ALT 9* 08/03/2014: B Natriuretic  Peptide 2801.4* 08/10/2014: Hemoglobin 9.1*; Platelets 429* 08/15/2014: BUN 28*; Creatinine, Ser 2.54*; Potassium 4.6; Sodium 132*  08/04/2014: Cholesterol 153; HDL 50; LDL Cholesterol 88; Total CHOL/HDL Ratio 3.1; Triglycerides 75; VLDL 15   Estimated Creatinine Clearance: 14 mL/min (by C-G formula based on Cr of 2.54).   Wt Readings from Last 3 Encounters:  08/29/14 121 lb (54.885 kg)  08/15/14 129 lb 9.6 oz (58.786 kg)  06/06/14 135 lb 12.9 oz (61.6 kg)     Other studies reviewed: Additional studies/records reviewed today include: summarized above  ASSESSMENT AND PLAN:  1. Chronic systolic CHF - appears euvolemic today on exam. Feeling great. I will not make any changes to her diuretic today. We discussed ways to increase caloric intake in her diet aside from Boost/Ensure, etc. to build up her protein status. I also briefly touched on goals of care with her. I told her that if she continues to have recurrent declines, she is within full rights to let us know if she would prefer a less aggressive approach. For now she would like to continue on with the kind of care she has been receiving. Refer to CHF clinic per Dr. Angelena Form. Continue daily weights and observation for symptoms. Will try to get copy of recent BMET from renal. 2. Severe MR - continue to follow medically. 3. CAD s/p PCI as above - no recurrent angina sx. She remains on aspirin and Plavix given recent stenting 05/2014. 4. CKD stage IV - will see if we can get a copy of recent BMET - this is followed by nephrology. 5. PAF - maintaining NSR on amiodarone. Decrease to once daily on Sept 1st.  Not on  anticoag for reasons above. Can consider this once she has completed her DAPT. 6. Anemia  - she denies any known bleeding. I advised her to please contact PCP to discuss further workup. 7. Abnormal Chest CT 05/2014 - advised her to discuss repeat with PCP.  Disposition: F/u with CHF clinic.  Current medicines are reviewed at length with the patient today.  The patient did not have any concerns regarding medicines.  Raechel Ache PA-C 08/29/2014 11:19 AM     [facility] [address] [place] [address] [phone] (office)  [phone] (fax)

## 2014-08-29 ENCOUNTER — Encounter: Payer: Self-pay | Admitting: Physician Assistant

## 2014-08-29 ENCOUNTER — Ambulatory Visit (INDEPENDENT_AMBULATORY_CARE_PROVIDER_SITE_OTHER): Payer: Medicare Other | Admitting: Physician Assistant

## 2014-08-29 VITALS — BP 110/50 | HR 61 | Ht 66.0 in | Wt 121.0 lb

## 2014-08-29 DIAGNOSIS — I480 Paroxysmal atrial fibrillation: Secondary | ICD-10-CM

## 2014-08-29 DIAGNOSIS — Z9861 Coronary angioplasty status: Secondary | ICD-10-CM

## 2014-08-29 DIAGNOSIS — I5022 Chronic systolic (congestive) heart failure: Secondary | ICD-10-CM

## 2014-08-29 DIAGNOSIS — N184 Chronic kidney disease, stage 4 (severe): Secondary | ICD-10-CM | POA: Diagnosis not present

## 2014-08-29 DIAGNOSIS — I2510 Atherosclerotic heart disease of native coronary artery without angina pectoris: Secondary | ICD-10-CM | POA: Diagnosis not present

## 2014-08-29 DIAGNOSIS — I340 Nonrheumatic mitral (valve) insufficiency: Secondary | ICD-10-CM

## 2014-08-29 DIAGNOSIS — D649 Anemia, unspecified: Secondary | ICD-10-CM

## 2014-08-29 MED ORDER — AMIODARONE HCL 200 MG PO TABS: 200.0000 mg | ORAL_TABLET | Freq: Every day | ORAL | Status: DC

## 2014-08-29 NOTE — Patient Instructions (Addendum)
Medication Instructions:  Decrease Amiodarone ( 200 mg ) daily on September 1st  Labwork: Will get renal bmet from Dr. Birdie Riddle office, faxing over to our office today.  Testing/Procedures: -None  Follow-Up: With Primary Care Doctor for abnormal Chest CT  With [facility], appointment has already been made and a New patient package will be mailed to you.  Any Other Special Instructions Will Be Listed Below (If Applicable).

## 2014-09-11 ENCOUNTER — Other Ambulatory Visit: Payer: Self-pay

## 2014-09-11 DIAGNOSIS — I5022 Chronic systolic (congestive) heart failure: Secondary | ICD-10-CM

## 2014-09-11 DIAGNOSIS — D649 Anemia, unspecified: Secondary | ICD-10-CM

## 2014-09-11 DIAGNOSIS — N184 Chronic kidney disease, stage 4 (severe): Secondary | ICD-10-CM

## 2014-09-11 DIAGNOSIS — I340 Nonrheumatic mitral (valve) insufficiency: Secondary | ICD-10-CM

## 2014-09-11 DIAGNOSIS — I2510 Atherosclerotic heart disease of native coronary artery without angina pectoris: Secondary | ICD-10-CM

## 2014-09-11 DIAGNOSIS — Z9861 Coronary angioplasty status: Secondary | ICD-10-CM

## 2014-09-11 DIAGNOSIS — I480 Paroxysmal atrial fibrillation: Secondary | ICD-10-CM

## 2014-09-11 MED ORDER — CARVEDILOL 3.125 MG PO TABS: 3.1250 mg | ORAL_TABLET | Freq: Two times a day (BID) | ORAL | Status: DC

## 2014-09-11 MED ORDER — FUROSEMIDE 80 MG PO TABS: 80.0000 mg | ORAL_TABLET | Freq: Two times a day (BID) | ORAL | Status: DC

## 2014-09-11 MED ORDER — AMIODARONE HCL 200 MG PO TABS: 200.0000 mg | ORAL_TABLET | Freq: Every day | ORAL | Status: DC

## 2014-09-11 NOTE — Telephone Encounter (Signed)
Ordered Medications       Disp Refills Start End    amiodarone (PACERONE) 200 MG tablet 30 tablet 6 08/29/2014     Take 1 tablet (200 mg total) by mouth daily. - Oral    Dayna N Dunn, PA-C at 08/28/2014 5:40 PM    furosemide (LASIX) 80 MG tablet Take 1 tablet (80 mg total) by mouth 2 (two) times daily         carvedilol (COREG) 3.125 MG tablet Take 1 tablet (3.125 mg total) by mouth 2 (two) times daily with a meal         Patient Instructions     Medication Instructions:  Decrease Amiodarone ( 200 mg ) daily on September 1st    Follow-Up: With Primary Care Doctor for abnormal Chest CT  With [facility], appointment has already been made and a New patient package will be mailed to you

## 2014-09-13 ENCOUNTER — Other Ambulatory Visit (HOSPITAL_COMMUNITY): Payer: Self-pay | Admitting: *Deleted

## 2014-09-13 ENCOUNTER — Telehealth: Payer: Self-pay

## 2014-09-13 DIAGNOSIS — I480 Paroxysmal atrial fibrillation: Secondary | ICD-10-CM

## 2014-09-13 DIAGNOSIS — N184 Chronic kidney disease, stage 4 (severe): Secondary | ICD-10-CM

## 2014-09-13 DIAGNOSIS — I5022 Chronic systolic (congestive) heart failure: Secondary | ICD-10-CM

## 2014-09-13 DIAGNOSIS — I2510 Atherosclerotic heart disease of native coronary artery without angina pectoris: Secondary | ICD-10-CM

## 2014-09-13 DIAGNOSIS — D649 Anemia, unspecified: Secondary | ICD-10-CM

## 2014-09-13 DIAGNOSIS — Z9861 Coronary angioplasty status: Secondary | ICD-10-CM

## 2014-09-13 DIAGNOSIS — I340 Nonrheumatic mitral (valve) insufficiency: Secondary | ICD-10-CM

## 2014-09-13 MED ORDER — CARVEDILOL 3.125 MG PO TABS: 3.1250 mg | ORAL_TABLET | Freq: Two times a day (BID) | ORAL | Status: DC

## 2014-09-13 MED ORDER — AMIODARONE HCL 200 MG PO TABS: 200.0000 mg | ORAL_TABLET | Freq: Every day | ORAL | Status: DC

## 2014-09-13 MED ORDER — FUROSEMIDE 80 MG PO TABS: 80.0000 mg | ORAL_TABLET | Freq: Two times a day (BID) | ORAL | Status: DC

## 2014-09-13 NOTE — Telephone Encounter (Signed)
Follow up      Pt is at [place] at [facility] needing a refill for amiodarone

## 2014-09-13 NOTE — Telephone Encounter (Signed)
Charlie Pitter, PA-C at 08/28/2014 5:40 PM  amiodarone (PACERONE) 200 MG tablet Take 1 tablet (200 mg total) by mouth 2 (two) times daily   carvedilol (COREG) 3.125 MG tablet Take 1 tablet (3.125 mg total) by mouth 2 (two) times daily with a meal         Medication Instructions:  Decrease Amiodarone ( 200 mg ) daily on September 1st

## 2014-09-24 ENCOUNTER — Ambulatory Visit (HOSPITAL_BASED_OUTPATIENT_CLINIC_OR_DEPARTMENT_OTHER)
Admission: RE | Admit: 2014-09-24 | Discharge: 2014-09-24 | Disposition: A | Discharge status: Home / Self Care | Payer: Medicare Other | Source: Ambulatory Visit | Attending: Internal Medicine | Admitting: Internal Medicine

## 2014-09-24 VITALS — BP 104/58 | HR 58 | Wt 124.1 lb

## 2014-09-24 DIAGNOSIS — I5023 Acute on chronic systolic (congestive) heart failure: Secondary | ICD-10-CM

## 2014-09-24 DIAGNOSIS — E785 Hyperlipidemia, unspecified: Secondary | ICD-10-CM

## 2014-09-24 DIAGNOSIS — I5042 Chronic combined systolic (congestive) and diastolic (congestive) heart failure: Secondary | ICD-10-CM

## 2014-09-24 DIAGNOSIS — N184 Chronic kidney disease, stage 4 (severe): Secondary | ICD-10-CM | POA: Diagnosis not present

## 2014-09-24 DIAGNOSIS — I252 Old myocardial infarction: Secondary | ICD-10-CM

## 2014-09-24 DIAGNOSIS — I480 Paroxysmal atrial fibrillation: Secondary | ICD-10-CM | POA: Insufficient documentation

## 2014-09-24 DIAGNOSIS — I080 Rheumatic disorders of both mitral and aortic valves: Secondary | ICD-10-CM | POA: Diagnosis not present

## 2014-09-24 DIAGNOSIS — I5022 Chronic systolic (congestive) heart failure: Secondary | ICD-10-CM

## 2014-09-24 DIAGNOSIS — Z9861 Coronary angioplasty status: Secondary | ICD-10-CM

## 2014-09-24 DIAGNOSIS — D649 Anemia, unspecified: Secondary | ICD-10-CM | POA: Insufficient documentation

## 2014-09-24 DIAGNOSIS — I739 Peripheral vascular disease, unspecified: Secondary | ICD-10-CM | POA: Insufficient documentation

## 2014-09-24 DIAGNOSIS — Z951 Presence of aortocoronary bypass graft: Secondary | ICD-10-CM

## 2014-09-24 DIAGNOSIS — Z79899 Other long term (current) drug therapy: Secondary | ICD-10-CM

## 2014-09-24 DIAGNOSIS — I255 Ischemic cardiomyopathy: Secondary | ICD-10-CM | POA: Insufficient documentation

## 2014-09-24 DIAGNOSIS — I2510 Atherosclerotic heart disease of native coronary artery without angina pectoris: Secondary | ICD-10-CM | POA: Insufficient documentation

## 2014-09-24 DIAGNOSIS — I340 Nonrheumatic mitral (valve) insufficiency: Secondary | ICD-10-CM

## 2014-09-24 DIAGNOSIS — J449 Chronic obstructive pulmonary disease, unspecified: Secondary | ICD-10-CM | POA: Insufficient documentation

## 2014-09-24 DIAGNOSIS — Z7902 Long term (current) use of antithrombotics/antiplatelets: Secondary | ICD-10-CM

## 2014-09-24 DIAGNOSIS — Z8249 Family history of ischemic heart disease and other diseases of the circulatory system: Secondary | ICD-10-CM | POA: Insufficient documentation

## 2014-09-24 DIAGNOSIS — I129 Hypertensive chronic kidney disease with stage 1 through stage 4 chronic kidney disease, or unspecified chronic kidney disease: Secondary | ICD-10-CM | POA: Insufficient documentation

## 2014-09-24 DIAGNOSIS — Z87891 Personal history of nicotine dependence: Secondary | ICD-10-CM

## 2014-09-24 DIAGNOSIS — Z7982 Long term (current) use of aspirin: Secondary | ICD-10-CM | POA: Insufficient documentation

## 2014-09-24 LAB — BASIC METABOLIC PANEL
ANION GAP: 9 (ref 5–15)
BUN: 45 mg/dL — AB (ref 6–20)
CO2: 27 mmol/L (ref 22–32)
Calcium: 9.4 mg/dL (ref 8.9–10.3)
Chloride: 100 mmol/L — ABNORMAL LOW (ref 101–111)
Creatinine, Ser: 2.78 mg/dL — ABNORMAL HIGH (ref 0.44–1.00)
GFR calc Af Amer: 17 mL/min — ABNORMAL LOW (ref >60)
GFR calc non Af Amer: 14 mL/min — ABNORMAL LOW (ref >60)
GLUCOSE: 130 mg/dL — AB (ref 65–99)
POTASSIUM: 4.3 mmol/L (ref 3.5–5.1)
Sodium: 136 mmol/L (ref 135–145)

## 2014-09-24 LAB — CBC
HEMATOCRIT: 34.8 % — AB (ref 36.0–46.0)
HEMOGLOBIN: 10.4 g/dL — AB (ref 12.0–15.0)
MCH: 23.2 pg — ABNORMAL LOW (ref 26.0–34.0)
MCHC: 29.9 g/dL — ABNORMAL LOW (ref 30.0–36.0)
MCV: 77.5 fL — ABNORMAL LOW (ref 78.0–100.0)
Platelets: 303 10*3/uL (ref 150–400)
RBC: 4.49 MIL/uL (ref 3.87–5.11)
RDW: 19.3 % — ABNORMAL HIGH (ref 11.5–15.5)
WBC: 6.2 10*3/uL (ref 4.0–10.5)

## 2014-09-24 MED ORDER — TORSEMIDE 20 MG PO TABS: 20.0000 mg | ORAL_TABLET | Freq: Two times a day (BID) | ORAL | Status: DC

## 2014-09-24 MED ORDER — TORSEMIDE 20 MG PO TABS: 40.0000 mg | ORAL_TABLET | Freq: Two times a day (BID) | ORAL | Status: DC

## 2014-09-24 NOTE — Patient Instructions (Addendum)
Stop Furosemide  Start Torsemide 40 mg (2 TABS) Twice daily   Labs today  Your physician recommends that you schedule a follow-up appointment in: 1 week

## 2014-09-24 NOTE — Progress Notes (Signed)
Advanced Heart Failure Medication Review by a Pharmacist  Does the patient  feel that his/her medications are working for him/her?  yes  Has the patient been experiencing any side effects to the medications prescribed?  no  Does the patient measure his/her own blood pressure or blood glucose at home?  yes   Does the patient have any problems obtaining medications due to transportation or finances?   no  Understanding of regimen: good Understanding of indications: good Potential of compliance: good    Pharmacist comments:  [name] is a pleasant 81 yo F presenting with a recent medication list from her nephrologist's office and with her daughter and granddaughter. She reports excellent compliance with all of her medications except her melatonin which she states she takes every so often and it does not seem to help her insomnia. I advised her to attempt to take it regularly every evening for 2-3 weeks due to its delayed effects and see if that helps. She also had amlodipine on the medication list she brought in which I confirmed with [place] she has not been receiving. She did not have any other medication-related questions or concerns for me at this time.   [name]. Velva Harman, PharmD, BCPS, CPP Clinical Pharmacist Pager: [phone] Phone: [phone] 09/24/2014 12:29 PM

## 2014-09-24 NOTE — Progress Notes (Signed)
Patient ID: [name], female   DOB: September 01, 1927, 81 y.o.   MRN: [id number]      Advanced Heart Failure Note Date:  09/24/2014  Patient ID:  [name], DOB [birth year], MRN [id number] PCP:  Leonard Downing, MD  Cardiologist: Dr. Angelena Form HF Cardiologist: Dr Aundra Dubin  Reason for referral: Systolic CHF with recent admission and worsening EF.  History of Present Illness:  [name] is a 81 y.o. 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   In 5/16, she had PCI with DES to [facility].   She was admitted for acute on chronic systolic CHF and chest pain 7/22-08/15/14. She has had multiple admissions for acute HF over the last several months.  Echo 08/04/14: EF 30%, diffuse HK, mild AS, mild AI, severe MR, severe LAE, mildly dilated RV/mildly reduced RV systolic function, mild RAE, mild-mod TR, PASP 35. She was initially diuresed with IV Lasix. She had a minimal bump in troponin to 0.04.   Cath showed severe MR and EF of 15-20%. Widely patent RCA stent, widely patent LIMA to LAD and man made Y graft to obtuse marginal #2.  Cath also showed severe OM1 stenosis with the risk of intervention outweighing the benefit. TEE was considered to further assess her MR, but was cancelled 2/2 new onset afib with RVR. She was started on amiodarone and converted to NSR. Given chronic anemia and need for DAPT with recent stents in 05/2014, she was not felt to be a candidate for anticoagulation at this time.  She remains on ASA 81 and Plavix. Post-cath she was noted to have AKI likely related to hypotension and CIN. Nephrology consulted and held antihypertensives and  hydrated the patient.  D/c weight 129lb.   She presents today to establish in HF clinic. She has had no chest pain.  She lives alone but has help from family.  No tachypalpitations.  She is in NSR today.  She is no longer using oxygen as oxygen saturations have been > 90% at home (95% on RA today).  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  She does, however, get short of breath walking from one side of her house to the other.  Showers especially get her tired.  +Orthopnea, no PND.  She has prominent edema.  She denies claudication-type symptoms but her toes/feet feel numb.  No ulcerations on feet.   ECG (8/16): NSR, LBBB  Labs (7/16): HCT 29 Labs (8/16): K 4.6, creatinine 2.54  PMH: 1. CAD:  - Inferior STEMI 7/10 with RCA BMS.  - 9/10 CABG x 2 with LIMA-LAD and RIMA-OM as Y graft from LIMA.   - 5/16 DES to proximal RCA.  - LHC (7/16) with EF 15-20%, severe MR, RCA stent patent, LIMA-LAD and Y graft to OM2 patent.  70% mLCx, 90% ostial OM1, 70% D1.  OM1 was only potential target, medically managed given no angina.  2. Chronic systolic CHF: Ischemic cardiomyopathy.  Echo (7/16) with EF 30%, diffuse hypokinesis, mild aortic stenosis, mild AI, severe MR, mildly dilated RV with mildly decreased systolic function, mild-moderate TR.   3. Mitral regurgitation: Severe, likely post-infarct MR.  4. Atrial fibrillation: Paroxysmal.  On amiodarone, not anticoagulated.  5. CKD: Has 1 functional kidney. 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  6. PAD: Bilateral SFA disease.  7. Anemia 8. LBBB 9. Mediastinal lymphadenopathy 10. Carotid stenosis: Carotid dopplers (4/16) with 60-79% bilateral ICA stenosis.  11. COPD: Currently not using oxygen.  12. HTN 13. Hyperlipidemia 14. Aortic stenosis: Mild on 7/16 echo.   Current Outpatient Prescriptions  Medication Sig Dispense Refill  . acetaminophen (TYLENOL) 325 MG tablet Take 650 mg by mouth daily as needed (pain).    Marland Kitchen albuterol (PROVENTIL HFA;VENTOLIN HFA) 108 (90 BASE)  MCG/ACT inhaler Inhale 2 puffs into the lungs every 4 (four) hours as needed for wheezing or shortness of breath.     Marland Kitchen amiodarone (PACERONE) 200 MG tablet Take 1 tablet (200 mg total) by mouth daily. 30 tablet 1  . aspirin EC 81 MG tablet Take 81 mg by mouth daily.    . carvedilol (COREG) 3.125 MG tablet Take 1 tablet (3.125 mg total) by mouth 2 (two) times daily with a meal. 60 tablet 1  . clopidogrel (PLAVIX) 75 MG tablet Take 1 tablet (75 mg total) by mouth daily with breakfast. 30 tablet 6  . Coenzyme Q-10 100 MG capsule Take 100 mg by mouth daily at 2 PM daily at 2 PM.     . furosemide (LASIX) 80 MG tablet Take 1 tablet (80 mg total) by mouth 2 (two) times daily. 60 tablet 0  . Melatonin 5 MG TABS Take 5 mg by mouth at bedtime as needed (sleep).    . potassium chloride (K-DUR,KLOR-CON) 10 MEQ tablet Take 2 tablets (20 mEq total) by mouth daily. 30 tablet 0  . pravastatin (PRAVACHOL) 80 MG tablet Take 80 mg by mouth daily.    . nitroGLYCERIN (NITROSTAT) 0.4 MG SL tablet Place 1 tablet (0.4 mg total) under the tongue every 5 (five) minutes as needed. (Patient not taking: Reported on 09/24/2014) 25 tablet 6   No current facility-administered medications for this encounter.    Allergies:   Review of patient's allergies indicates no known allergies.   Social History:  The patient  reports that she quit smoking about 6 years ago. Her smoking use included Cigarettes. She has a 60 pack-year smoking history. She has never used smokeless tobacco. She reports that she does not drink alcohol or use illicit drugs.   Family History:  The patient's family history includes Coronary artery disease in her brother and mother; Heart attack in her sister; Heart disease in her mother; Heart failure in her mother; Kidney cancer in her brother; Leukemia in her father, sister, and sister. There is no history of Colon cancer, Pancreatic cancer, Stomach cancer, Rectal cancer, Stroke, or Hypertension.  ROS:  Please  see the history of present illness.    All other systems are reviewed and otherwise negative.   PHYSICAL EXAM:  VS:  BP 104/58 mmHg  Pulse 58  Wt 124 lb 1.9 oz (56.3 kg)  SpO2 95%  BMI: Body mass index is 20.04 kg/(m^2).   General. Well nourished, well developed thin WF, NAD HEENT: normocephalic, atraumatic Neck: JVD 12 cm, carotid bruits or masses Cardiac:  normal S1, S2; RRR; 3/6 SEM at apex Lungs:  CTA bilaterally. Abd: soft, nontender, no hepatomegaly, + BS MS: no deformity or atrophy Ext: No cyanosis, clubbing. 2+ edema to knees bilaterally.  Skin: warm and dry, no rash. Lots of bruising Neuro:  moves all extremities spontaneously, no focal abnormalities noted, follows commands Psych: Normal affect  Wt Readings from Last 3 Encounters:  09/24/14 124 lb 1.9 oz (56.3 kg)  08/29/14 121 lb (54.885 kg)  08/15/14 129 lb 9.6 oz (58.786 kg)    ASSESSMENT AND PLAN: 1. Chronic systolic CHF - appears volume overloaded on exam. - Continue daily weights  - Continue Coreg 3.125 BID - No ACE/ARB with CKD - Switch from lasix to Torsemide 40 mg BID - Further med titration limited currently with soft BP. - Had brief goals of care talk at last [facility] visit 2. Severe MR - continue to follow medically 3. CAD s/p PCI - no recurrent angina sx. She remains on aspirin and Plavix given recent stenting 05/2014. 4. CKD stage IV - Will follow closely with diuresis 5. PAF  - Continue amiodarone 200 mg daily - Not on anticoag 2/2 chronic anemia and need for DAPT s/p stents in 5/17 6. Anemia  7. Abnormal Chest CT 05/2014 - advised her to discuss repeat with PCP.  BMET and CBC today. F/u 1 week.  Legrand Como [address] [place], PA-C 09/24/2014 12:10 PM   Patient seen with PA, agree with the above note.  1. Chronic systolic CHF: Ischemic cardiomyopathy, EF 30% with diffuse hypokinesis. On exam today, she is quite volume overloaded with NYHA class IIIb symptoms.  She is dyspneic walking around the house.   Diuresis is going to be difficult given renal dysfunction.  - I will have her stop Lasix and start on torsemide 40 mg bid.  BMET/BNP today.  - Continue Coreg at low dose.  - No ACEI or spironolactone for now with creatinine high (2.54). No BP room for hydral/Imdur for now.  Elevated creatinine makes digoxin a difficult medication to use in this small woman.  - She has LBBB but given age, would lean against CRT-D device.  CRT-P may be an option and will need to be explored.  2. CAD: s/p CABG.  DES to native RCA in 5/16.  She is on ASA 81 and Plavix, which she will continue for at least a year.  At that time, can consider stopping Plavix and starting anticoagulation given PAF. Continue statin.  3. Atrial fibrillation: Paroxysmal, on amiodarone.  She was deemed to be a poor candidate for triple anticoagulant therapy. Will need to follow LFTs and TSH with amiodarone, check at next appointment.  She will need regular eye exams while on amiodarone.  4. Mitral regurgitation: Severe by echo.  Suspect infarct-related MR.  She is not a candidate for open MV repair/replacement.  Mitral valve clip could be an option.  5. PAD: Unable to palpate pedal pulses.  Feet described as numb and cold.  I will arrange for peripheral arterial doppler  evaluation.  6. CKD: Will need to follow BMET closely with HD.  Has a nephrologist.   Followup in 1 week.   Loralie Champagne 09/25/2014

## 2014-09-25 ENCOUNTER — Emergency Department (HOSPITAL_COMMUNITY): Payer: Medicare Other

## 2014-09-25 ENCOUNTER — Encounter (HOSPITAL_COMMUNITY): Payer: Self-pay | Admitting: Emergency Medicine

## 2014-09-25 ENCOUNTER — Inpatient Hospital Stay (HOSPITAL_COMMUNITY)
Admission: EM | Admit: 2014-09-25 | Discharge: 2014-10-01 | DRG: 292 | Disposition: A | Discharge status: Home Health | Payer: Medicare Other | Attending: Cardiology | Admitting: Cardiology

## 2014-09-25 DIAGNOSIS — I083 Combined rheumatic disorders of mitral, aortic and tricuspid valves: Secondary | ICD-10-CM | POA: Diagnosis present

## 2014-09-25 DIAGNOSIS — Z79899 Other long term (current) drug therapy: Secondary | ICD-10-CM

## 2014-09-25 DIAGNOSIS — Z87891 Personal history of nicotine dependence: Secondary | ICD-10-CM

## 2014-09-25 DIAGNOSIS — Q600 Renal agenesis, unilateral: Secondary | ICD-10-CM

## 2014-09-25 DIAGNOSIS — J449 Chronic obstructive pulmonary disease, unspecified: Secondary | ICD-10-CM | POA: Diagnosis present

## 2014-09-25 DIAGNOSIS — Z9861 Coronary angioplasty status: Secondary | ICD-10-CM

## 2014-09-25 DIAGNOSIS — I130 Hypertensive heart and chronic kidney disease with heart failure and stage 1 through stage 4 chronic kidney disease, or unspecified chronic kidney disease: Secondary | ICD-10-CM | POA: Diagnosis present

## 2014-09-25 DIAGNOSIS — Z951 Presence of aortocoronary bypass graft: Secondary | ICD-10-CM

## 2014-09-25 DIAGNOSIS — N179 Acute kidney failure, unspecified: Secondary | ICD-10-CM | POA: Diagnosis not present

## 2014-09-25 DIAGNOSIS — I447 Left bundle-branch block, unspecified: Secondary | ICD-10-CM | POA: Diagnosis present

## 2014-09-25 DIAGNOSIS — I959 Hypotension, unspecified: Secondary | ICD-10-CM | POA: Diagnosis not present

## 2014-09-25 DIAGNOSIS — I255 Ischemic cardiomyopathy: Secondary | ICD-10-CM | POA: Diagnosis present

## 2014-09-25 DIAGNOSIS — I5023 Acute on chronic systolic (congestive) heart failure: Principal | ICD-10-CM | POA: Diagnosis present

## 2014-09-25 DIAGNOSIS — I340 Nonrheumatic mitral (valve) insufficiency: Secondary | ICD-10-CM | POA: Diagnosis present

## 2014-09-25 DIAGNOSIS — I252 Old myocardial infarction: Secondary | ICD-10-CM

## 2014-09-25 DIAGNOSIS — E785 Hyperlipidemia, unspecified: Secondary | ICD-10-CM | POA: Diagnosis present

## 2014-09-25 DIAGNOSIS — Z9981 Dependence on supplemental oxygen: Secondary | ICD-10-CM

## 2014-09-25 DIAGNOSIS — I080 Rheumatic disorders of both mitral and aortic valves: Secondary | ICD-10-CM | POA: Diagnosis present

## 2014-09-25 DIAGNOSIS — I739 Peripheral vascular disease, unspecified: Secondary | ICD-10-CM | POA: Diagnosis present

## 2014-09-25 DIAGNOSIS — I2510 Atherosclerotic heart disease of native coronary artery without angina pectoris: Secondary | ICD-10-CM | POA: Diagnosis present

## 2014-09-25 DIAGNOSIS — N184 Chronic kidney disease, stage 4 (severe): Secondary | ICD-10-CM | POA: Diagnosis present

## 2014-09-25 DIAGNOSIS — I10 Essential (primary) hypertension: Secondary | ICD-10-CM | POA: Diagnosis present

## 2014-09-25 DIAGNOSIS — Z7982 Long term (current) use of aspirin: Secondary | ICD-10-CM

## 2014-09-25 DIAGNOSIS — D649 Anemia, unspecified: Secondary | ICD-10-CM | POA: Diagnosis present

## 2014-09-25 DIAGNOSIS — I480 Paroxysmal atrial fibrillation: Secondary | ICD-10-CM | POA: Diagnosis present

## 2014-09-25 MED ORDER — NITROGLYCERIN 0.4 MG SL SUBL: 0.4000 mg | SUBLINGUAL_TABLET | SUBLINGUAL | Status: DC | PRN

## 2014-09-25 MED ORDER — ASPIRIN 81 MG PO CHEW
324.0000 mg | CHEWABLE_TABLET | Freq: Once | ORAL | Status: DC
Start: 2014-09-25 — End: 2014-09-26

## 2014-09-25 NOTE — ED Provider Notes (Signed)
CSN: [id number]     Arrival date & time 09/25/14  2243 History  This chart was scribed for Everlene Balls, MD by Meriel Pica, ED Scribe. This patient was seen in room B19C/B19C and the patient's care was started 11:30 PM.  Chief Complaint  Patient presents with  . Chest Pain   The history is provided by the patient and the EMS personnel. No language interpreter was used.   Marland KitchenHPI Comments: ARMYA WESTERHOFF is a 81 y.o. female, with a PMhx of CHF, CAD, COPD, and MI, brought in by ambulance, who presents to the Emergency Department complaining of worsening, constant SOB onset today. She has associated BLE edema. She describes the feeling that something was sitting on the right side of her chest. Her SOB is exacerbated when lying down and at night. The pt reports she has been checking her O2 at home and it has been reading in the 90% today. EMS call out due to chest pressure; pt was given 5 Nitro and 324 aspirin en route. Pt's diuretic was recently changed from Lasix to torsemide 1 day ago during PCP visit. Denies recent illness, URI symptoms, fevers, or recent weight gain since discharge.   Past Medical History  Diagnosis Date  . CAD (coronary artery disease)     a. 07/2008 Inf STEMI->RCA BMS;  b. 09/2008 CABGx2: LIMA->LAD, RIMA->OM as Y from LIMA;  c. s/p DES to prox RCA 05/2014, severe OM lesion being treated medically.  . Ischemic cardiomyopathy     a. EF 15-20% in 08/2014, prev 35-40% range.  . Chronic systolic CHF (congestive heart failure)     a. 06/2011 EF 40%  . Hypertension   . Hyperlipidemia   . Hiatal hernia     large  . Tobacco abuse   . Cerebrovascular disease   . COPD (chronic obstructive pulmonary disease)     a. on home O2.  . Arterial disease     peripheral arterial disease with claudication - bilateral SFA disease with ABI's 0.4-0.5 bilaterally   . Mitral regurgitation     a. Severe MR, not felt to be a surgical candidate.  . CKD (chronic kidney disease), stage IV      born with 1 kidney  . Thyroid nodule 10/2012    left dominant nodule cytology:non-neoplastic goiter.   . Cataracts, both eyes     not surgical  . Personal history of colonic polyps - adenomas 05/09/2013  . Anemia   . Hypertensive renal disease   . Hypertensive heart disease with congestive heart failure   . Aorta aneurysm   . LVH (left ventricular hypertrophy)   . Bronchiectasis   . Atherosclerosis of abdominal aorta   . Chronic respiratory failure   . PAF (paroxysmal atrial fibrillation)     a. Dx 08/2014, not a candidate for anticoag due to anemia and need for DAPT - can reconsider once she comes off Plavix.   Past Surgical History  Procedure Laterality Date  . Laprotomy for bowel obstruction    . Coronary artery bypass graft  09/2008    aortic thoraic anuerysm resecton 2010.   Marland Kitchen Coronary stent placement  07/2008    BMS to RCA  . Flexible sigmoidoscopy N/A 05/09/2013    Procedure: FLEXIBLE SIGMOIDOSCOPY;  Surgeon: [name], MD;  Location: [place];  Service: Endoscopy;  Laterality: N/A;  with possible hemorrhoid banding use gastrosope unsdeated  . Appendectomy    . Oophorectomy      one removed in 70's  .  Hemorrhoid surgery    . Colonoscopy N/A 05/12/2013    Procedure: COLONOSCOPY;  Surgeon: [name], MD;  Location: [facility];  Service: Endoscopy;  Laterality: N/A;  . Cardiac catheterization N/A 05/30/2014    Procedure: Right/Left Heart Cath and Coronary/Graft Angiography;  Surgeon: Peter M Martinique, MD;  Location: [place] [facility];  Service: Cardiovascular;  Laterality: N/A;  . Cardiac catheterization N/A 06/04/2014    Procedure: Coronary Stent Intervention;  Surgeon: Belva Crome, MD;  Location: [place] [facility];  Service: Cardiovascular;  Laterality: N/A;  . Cardiac catheterization N/A 08/06/2014    Procedure: Left Heart Cath and Coronary Angiography;  Surgeon: Belva Crome, MD;  Location: [place] [facility];  Service: Cardiovascular;  Laterality: N/A;    Family History  Problem Relation Age of Onset  . Coronary artery disease Mother     extensive family history; several other family members.  . Heart disease Mother     CHF  . Coronary artery disease Brother   . Leukemia Sister   . Leukemia Sister   . Leukemia Father   . Colon cancer Neg Hx   . Pancreatic cancer Neg Hx   . Stomach cancer Neg Hx   . Rectal cancer Neg Hx   . Kidney cancer Brother   . Heart failure Mother   . Heart attack Sister   . Stroke Neg Hx   . Hypertension Neg Hx    Social History  Substance Use Topics  . Smoking status: Former Smoker -- 1.00 packs/day for 60 years    Types: Cigarettes    Quit date: 07/12/2008  . Smokeless tobacco: Never Used  . Alcohol Use: No   OB History    No data available     Review of Systems  10 Systems reviewed and are negative for acute change except as noted in the HPI.  Allergies  Review of patient's allergies indicates no known allergies.  Home Medications   Prior to Admission medications   Medication Sig Start Date End Date Taking? Authorizing Provider  acetaminophen (TYLENOL) 325 MG tablet Take 650 mg by mouth daily as needed (pain).   Yes Historical Provider, MD  albuterol (PROVENTIL HFA;VENTOLIN HFA) 108 (90 BASE) MCG/ACT inhaler Inhale 2 puffs into the lungs every 4 (four) hours as needed for wheezing or shortness of breath.    Yes Historical Provider, MD  amiodarone (PACERONE) 200 MG tablet Take 1 tablet (200 mg total) by mouth daily. 09/13/14  Yes Dayna N Dunn, PA-C  aspirin EC 81 MG tablet Take 81 mg by mouth daily.   Yes Historical Provider, MD  carvedilol (COREG) 3.125 MG tablet Take 1 tablet (3.125 mg total) by mouth 2 (two) times daily with a meal. 09/13/14  Yes Dayna N Dunn, PA-C  clopidogrel (PLAVIX) 75 MG tablet Take 1 tablet (75 mg total) by mouth daily with breakfast. 07/05/14  Yes Burnell Blanks, MD  Coenzyme Q-10 100 MG capsule Take 100 mg by mouth daily at 2 PM daily at 2 PM.    Yes Historical  Provider, MD  Melatonin 5 MG TABS Take 5 mg by mouth at bedtime as needed (sleep).   Yes Historical Provider, MD  nitroGLYCERIN (NITROSTAT) 0.4 MG SL tablet Place 1 tablet (0.4 mg total) under the tongue every 5 (five) minutes as needed. 04/13/14  Yes Burnell Blanks, MD  potassium chloride (K-DUR,KLOR-CON) 10 MEQ tablet Take 2 tablets (20 mEq total) by mouth daily. 08/15/14  Yes [place],  MD  pravastatin (PRAVACHOL) 80 MG tablet Take 80 mg by mouth daily.   Yes Historical Provider, MD  torsemide (DEMADEX) 20 MG tablet Take 2 tablets (40 mg total) by mouth 2 (two) times daily. 09/24/14  Yes Larey Dresser, MD   BP 110/69 mmHg  Pulse 58  Temp(Src) 98.2 F (36.8 C) (Oral)  Resp 17  Ht 5\' 8"  (1.727 m)  Wt 121 lb (54.885 kg)  BMI 18.40 kg/m2  SpO2 100% Physical Exam  Constitutional: She is oriented to person, place, and time. She appears well-developed and well-nourished. No distress.  HENT:  Head: Normocephalic and atraumatic.  Nose: Nose normal.  Mouth/Throat: Oropharynx is clear and moist. No oropharyngeal exudate.  Eyes: Conjunctivae and EOM are normal. Pupils are equal, round, and reactive to light. No scleral icterus.  Neck: Normal range of motion. Neck supple. No JVD present. No tracheal deviation present. No thyromegaly present.  Cardiovascular: Normal rate, regular rhythm and normal heart sounds.  Exam reveals no gallop and no friction rub.   No murmur heard. Pulmonary/Chest: Effort normal. No respiratory distress. She has no wheezes. She exhibits no tenderness.  Fine crackles in bilateral lower lobes; 2+ bilateral extremity edema to knees.   Abdominal: Soft. Bowel sounds are normal. She exhibits no distension and no mass. There is no tenderness. There is no rebound and no guarding.  Musculoskeletal: Normal range of motion. She exhibits no edema or tenderness.  Lymphadenopathy:    She has no cervical adenopathy.  Neurological: She is alert and oriented to person,  place, and time. No cranial nerve deficit. She exhibits normal muscle tone.  Skin: Skin is warm and dry. No rash noted. No erythema. No pallor.  Nursing note and vitals reviewed.   ED Course  Procedures  DIAGNOSTIC STUDIES: Oxygen Saturation is 100% on RA, normal by my interpretation.    COORDINATION OF CARE: 11:36 PM Discussed treatment plan which includes to order diagnostic labs with pt. Pt acknowledges and agrees to plan.   Labs Review Labs Reviewed  CBC - Abnormal; Notable for the following:    Hemoglobin 9.5 (*)    HCT 32.3 (*)    MCV 76.7 (*)    MCH 22.6 (*)    MCHC 29.4 (*)    RDW 19.4 (*)    All other components within normal limits  BASIC METABOLIC PANEL - Abnormal; Notable for the following:    Sodium 134 (*)    Chloride 99 (*)    Glucose, Bld 108 (*)    BUN 50 (*)    Creatinine, Ser 2.90 (*)    Calcium 8.8 (*)    GFR calc non Af Amer 14 (*)    GFR calc Af Amer 16 (*)    All other components within normal limits  BRAIN NATRIURETIC PEPTIDE - Abnormal; Notable for the following:    B Natriuretic Peptide 3390.2 (*)    All other components within normal limits  MAGNESIUM  I-STAT TROPOININ, ED    Imaging Review Dg Chest 2 View  09/25/2014   CLINICAL DATA:  Difficulty breathing and weakness since yesterday. Smoker.  EXAM: CHEST  2 VIEW  COMPARISON:  08/10/2014 and 08/05/2014 as well as 06/01/2014 and chest CT 06/05/2014  FINDINGS: Patient is rotated to the right. Median sternotomy wires unchanged. Lungs are adequately inflated and demonstrate mild stable bibasilar opacification likely small effusions with associated atelectasis/ scarring. Subtle prominence of the perihilar markings likely mild vascular congestion. Stable prominence of the right hilum. Stable moderate  cardiomegaly. Evidence of moderate size hiatal hernia. Remainder of the exam is unchanged.  IMPRESSION: Stable moderate cardiomegaly with suggestion of minimal vascular congestion.  Chronic bibasilar  opacification compatible with small effusions and associated atelectasis/ scarring.  Moderate size hiatal hernia.   Electronically Signed   By: Marin Olp M.D.   On: 09/25/2014 23:52   I have personally reviewed and evaluated these images and lab results as part of my medical decision-making.   EKG Interpretation   Date/Time:  Tuesday September 25 2014 22:43:23 EDT Ventricular Rate:  63 PR Interval:  217 QRS Duration: 171 QT Interval:  510 QTC Calculation: 522 R Axis:   -62 Text Interpretation:  Sinus rhythm Borderline prolonged PR interval Left  bundle branch block with QRS widening Otherwise no significant change  Confirmed by FLOYD MD, Quillian Quince ([id number]) on 09/25/2014 10:52:46 PM      MDM   Final diagnoses:  Acute on chronic systolic heart failure    Patient presents emergency department for chest pressure and worsening shortness of breath. She has worsening dyspnea on exertion and sleep orthopnea as well. This is likely an exacerbation of her heart failure. Exam reveals edema in her legs which she says has mildly increased. She recently changed her Lasix to torsemide yesterday at her cardiologist's appointment. This has not helped her. Patient will require admission for further management and diuresis.  I personally performed the services described in this documentation, which was scribed in my presence. The recorded information has been reviewed and is accurate. [place], MD 09/26/14 2045

## 2014-09-25 NOTE — ED Notes (Signed)
[facility] was called to pt's home due to right sided chest "pressure".  She was given 5 nitro inrout and 324 Asp.  She has an extensive heart issue w/ a heart attack 10 years ago, stints replaced April/May.  No c/o n/v/d only SOB.  She denies pressure at this time.

## 2014-09-26 DIAGNOSIS — I480 Paroxysmal atrial fibrillation: Secondary | ICD-10-CM

## 2014-09-26 DIAGNOSIS — I340 Nonrheumatic mitral (valve) insufficiency: Secondary | ICD-10-CM | POA: Diagnosis not present

## 2014-09-26 DIAGNOSIS — I5023 Acute on chronic systolic (congestive) heart failure: Principal | ICD-10-CM

## 2014-09-26 DIAGNOSIS — I509 Heart failure, unspecified: Secondary | ICD-10-CM | POA: Insufficient documentation

## 2014-09-26 LAB — BASIC METABOLIC PANEL
ANION GAP: 10 (ref 5–15)
Anion gap: 10 (ref 5–15)
BUN: 48 mg/dL — ABNORMAL HIGH (ref 6–20)
BUN: 50 mg/dL — AB (ref 6–20)
CHLORIDE: 99 mmol/L — AB (ref 101–111)
CHLORIDE: 99 mmol/L — AB (ref 101–111)
CO2: 27 mmol/L (ref 22–32)
CO2: 25 mmol/L (ref 22–32)
Calcium: 8.7 mg/dL — ABNORMAL LOW (ref 8.9–10.3)
Calcium: 8.8 mg/dL — ABNORMAL LOW (ref 8.9–10.3)
Creatinine, Ser: 2.9 mg/dL — ABNORMAL HIGH (ref 0.44–1.00)
Creatinine, Ser: 2.92 mg/dL — ABNORMAL HIGH (ref 0.44–1.00)
GFR calc Af Amer: 16 mL/min — ABNORMAL LOW (ref >60)
GFR calc Af Amer: 16 mL/min — ABNORMAL LOW (ref >60)
GFR calc non Af Amer: 14 mL/min — ABNORMAL LOW (ref >60)
GFR, EST NON AFRICAN AMERICAN: 14 mL/min — AB (ref >60)
GLUCOSE: 108 mg/dL — AB (ref 65–99)
Glucose, Bld: 95 mg/dL (ref 65–99)
POTASSIUM: 4.2 mmol/L (ref 3.5–5.1)
POTASSIUM: 4.6 mmol/L (ref 3.5–5.1)
SODIUM: 136 mmol/L (ref 135–145)
Sodium: 134 mmol/L — ABNORMAL LOW (ref 135–145)

## 2014-09-26 LAB — CBC
HEMATOCRIT: 32.5 % — AB (ref 36.0–46.0)
HEMATOCRIT: 32.3 % — AB (ref 36.0–46.0)
HEMOGLOBIN: 9.6 g/dL — AB (ref 12.0–15.0)
Hemoglobin: 9.5 g/dL — ABNORMAL LOW (ref 12.0–15.0)
MCH: 22.7 pg — ABNORMAL LOW (ref 26.0–34.0)
MCH: 22.6 pg — AB (ref 26.0–34.0)
MCHC: 29.5 g/dL — AB (ref 30.0–36.0)
MCHC: 29.4 g/dL — AB (ref 30.0–36.0)
MCV: 77 fL — AB (ref 78.0–100.0)
MCV: 76.7 fL — AB (ref 78.0–100.0)
PLATELETS: 268 10*3/uL (ref 150–400)
Platelets: 299 10*3/uL (ref 150–400)
RBC: 4.21 MIL/uL (ref 3.87–5.11)
RBC: 4.22 MIL/uL (ref 3.87–5.11)
RDW: 19.4 % — AB (ref 11.5–15.5)
RDW: 19.4 % — AB (ref 11.5–15.5)
WBC: 6.5 10*3/uL (ref 4.0–10.5)
WBC: 6.6 10*3/uL (ref 4.0–10.5)

## 2014-09-26 LAB — BRAIN NATRIURETIC PEPTIDE: B Natriuretic Peptide: 3390.2 pg/mL — ABNORMAL HIGH (ref 0.0–100.0)

## 2014-09-26 LAB — MAGNESIUM: Magnesium: 2.4 mg/dL (ref 1.7–2.4)

## 2014-09-26 LAB — I-STAT TROPONIN, ED: Troponin i, poc: 0.04 ng/mL (ref 0.00–0.08)

## 2014-09-26 LAB — MRSA PCR SCREENING: MRSA BY PCR: NEGATIVE

## 2014-09-26 MED ORDER — PRAVASTATIN SODIUM 40 MG PO TABS
80.0000 mg | ORAL_TABLET | Freq: Every day | ORAL | Status: DC
  Administered 2014-09-26 – 2014-09-30 (×5): 80 mg via ORAL
  Filled 2014-09-26 (×5): qty 2
  Filled 2014-09-26: qty 1

## 2014-09-26 MED ORDER — IPRATROPIUM-ALBUTEROL 0.5-2.5 (3) MG/3ML IN SOLN: 3.0000 mL | Freq: Four times a day (QID) | RESPIRATORY_TRACT | Status: DC | PRN

## 2014-09-26 MED ORDER — ASPIRIN EC 81 MG PO TBEC
81.0000 mg | DELAYED_RELEASE_TABLET | Freq: Every day | ORAL | Status: DC
  Administered 2014-09-26 – 2014-10-01 (×6): 81 mg via ORAL
  Filled 2014-09-26 (×6): qty 1

## 2014-09-26 MED ORDER — FUROSEMIDE 10 MG/ML IJ SOLN
40.0000 mg | Freq: Two times a day (BID) | INTRAMUSCULAR | Status: DC
Start: 2014-09-26 — End: 2014-09-27
  Administered 2014-09-26 – 2014-09-27 (×3): 40 mg via INTRAVENOUS
  Filled 2014-09-26 (×3): qty 4

## 2014-09-26 MED ORDER — ZOLPIDEM TARTRATE 5 MG PO TABS
5.0000 mg | ORAL_TABLET | Freq: Every evening | ORAL | Status: DC | PRN
  Administered 2014-09-26 – 2014-09-30 (×3): 5 mg via ORAL
  Filled 2014-09-26 (×3): qty 1

## 2014-09-26 MED ORDER — SODIUM CHLORIDE 0.9 % IJ SOLN
3.0000 mL | Freq: Two times a day (BID) | INTRAMUSCULAR | Status: DC
  Administered 2014-09-26 – 2014-10-01 (×12): 3 mL via INTRAVENOUS

## 2014-09-26 MED ORDER — CETYLPYRIDINIUM CHLORIDE 0.05 % MT LIQD
7.0000 mL | Freq: Two times a day (BID) | OROMUCOSAL | Status: DC
  Administered 2014-09-26 – 2014-10-01 (×10): 7 mL via OROMUCOSAL

## 2014-09-26 MED ORDER — POTASSIUM CHLORIDE CRYS ER 20 MEQ PO TBCR
20.0000 meq | EXTENDED_RELEASE_TABLET | Freq: Every day | ORAL | Status: DC
  Administered 2014-09-26 – 2014-10-01 (×6): 20 meq via ORAL
  Filled 2014-09-26 (×6): qty 1

## 2014-09-26 MED ORDER — MELATONIN 5 MG PO TABS: 5.0000 mg | ORAL_TABLET | Freq: Every evening | ORAL | Status: DC | PRN

## 2014-09-26 MED ORDER — HEPARIN SODIUM (PORCINE) 5000 UNIT/ML IJ SOLN
5000.0000 [IU] | Freq: Three times a day (TID) | INTRAMUSCULAR | Status: DC
  Administered 2014-09-26 – 2014-09-27 (×4): 5000 [IU] via SUBCUTANEOUS
  Filled 2014-09-26 (×5): qty 1

## 2014-09-26 MED ORDER — CARVEDILOL 3.125 MG PO TABS
3.1250 mg | ORAL_TABLET | Freq: Two times a day (BID) | ORAL | Status: DC
  Administered 2014-09-26 – 2014-10-01 (×11): 3.125 mg via ORAL
  Filled 2014-09-26 (×15): qty 1

## 2014-09-26 MED ORDER — AMIODARONE HCL 200 MG PO TABS
200.0000 mg | ORAL_TABLET | Freq: Every day | ORAL | Status: DC
  Administered 2014-09-26 – 2014-10-01 (×6): 200 mg via ORAL
  Filled 2014-09-26 (×7): qty 1

## 2014-09-26 MED ORDER — CLOPIDOGREL BISULFATE 75 MG PO TABS
75.0000 mg | ORAL_TABLET | Freq: Every day | ORAL | Status: DC
  Administered 2014-09-26 – 2014-10-01 (×6): 75 mg via ORAL
  Filled 2014-09-26 (×6): qty 1

## 2014-09-26 NOTE — H&P (Signed)
Primary cardiolgist: Dr. Aundra Dubin HPI: Per office note "[name] is a 81 y.o. 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   In 5/16, she had PCI with DES to [facility].   She was admitted for acute on chronic systolic CHF and chest pain 7/22-08/15/14. She has had multiple admissions for acute HF over the last several months. Echo 08/04/14: EF 30%, diffuse HK, mild AS, mild AI, severe MR, severe LAE, mildly dilated RV/mildly reduced RV systolic function, mild RAE, mild-mod TR, PASP 35. She was initially diuresed with IV Lasix. She had a minimal bump in troponin to 0.04. Cath showed severe MR and EF of 15-20%. Widely patent RCA stent, widely patent LIMA to LAD and man made Y graft to obtuse marginal #2. Cath also showed severe OM1 stenosis with the risk of intervention outweighing the benefit. TEE was considered to further assess her MR, but was cancelled 2/2 new onset afib with RVR. She was started on amiodarone and converted to NSR. Given chronic anemia and need for DAPT with recent stents in 05/2014, she was not felt to be a candidate for anticoagulation at this time. She remains on ASA 81 and Plavix. Post-cath she was noted to have AKI likely related to hypotension and CIN. Nephrology consulted and held antihypertensives and hydrated the patient. D/c weight 129lb. "  She came to ED with c/o worsening shortness of breath in last 2-3 days. Her diuretics were increased yesterday but today she felt sob so came to ED. States that she is taking her meds regularly and no change in her diet or meds In ed workup shows worsening heart failure   Review of Systems:      Cardiac Review of Systems: {Y] = yes [ ]  = no  Chest Pain [    ]  Resting SOB [  y ] Exertional SOB  Blue.Reese  ]  Orthopnea [ y ]   Pedal Edema [   ]    Palpitations [  ] Syncope  [  ]   Presyncope [   ]  General Review of Systems: [Y] = yes [  ]=no Constitional: recent weight change [  ]; anorexia [  ]; fatigue [  ]; nausea [  ]; night sweats [  ]; fever [  ]; or chills [  ];                                                                     Dental: poor dentition[  ];   Eye : blurred vision [  ]; diplopia [   ]; vision changes [  ];  Amaurosis fugax[  ]; Resp: cough [  ];  wheezing[  ];  hemoptysis[  ]; shortness of breath[  ]; paroxysmal nocturnal dyspnea[  ]; dyspnea on exertion[  ]; or orthopnea[  ];  GI:  gallstones[  ], vomiting[  ];  dysphagia[  ];  melena[  ];  hematochezia [  ]; heartburn[  ];   GU: kidney stones [  ]; hematuria[  ];   dysuria [  ];  nocturia[  ];               Skin: rash [  ], swelling[  ];, hair loss[  ];  peripheral edema[  ];  or itching[  ]; Musculosketetal: myalgias[  ];  joint swelling[  ];  joint erythema[  ];  joint pain[  ];  back pain[  ];  Heme/Lymph: bruising[  ];  bleeding[  ];  anemia[  ];  Neuro: TIA[  ];  headaches[  ];  stroke[  ];  vertigo[  ];  seizures[  ];   paresthesias[  ];  difficulty walking[  ];  Psych:depression[  ]; anxiety[  ];  Endocrine: diabetes[  ];  thyroid dysfunction[  ];  Other:  Past Medical History  Diagnosis Date  . CAD (coronary artery disease)     a. 07/2008 Inf STEMI->RCA BMS;  b. 09/2008 CABGx2: LIMA->LAD, RIMA->OM as Y from LIMA;  c. s/p DES to prox RCA 05/2014, severe OM lesion being treated medically.  . Ischemic cardiomyopathy     a. EF 15-20% in 08/2014, prev 35-40% range.  . Chronic systolic CHF (congestive heart failure)     a. 06/2011 EF 40%  . Hypertension   . Hyperlipidemia   . Hiatal hernia     large  . Tobacco abuse   . Cerebrovascular disease   . COPD (chronic obstructive pulmonary disease)     a. on home  O2.  . Arterial disease     peripheral arterial disease with claudication - bilateral SFA disease with ABI's 0.4-0.5 bilaterally   . Mitral regurgitation     a. Severe MR, not felt to be a surgical candidate.  . CKD (chronic kidney disease), stage IV     born with 1 kidney  . Thyroid nodule 10/2012    left dominant nodule cytology:non-neoplastic goiter.   . Cataracts, both eyes     not surgical  . Personal history of colonic polyps - adenomas 05/09/2013  . Anemia   . Hypertensive renal disease   . Hypertensive heart disease with congestive heart failure   . Aorta aneurysm   . LVH (left ventricular hypertrophy)   . Bronchiectasis   . Atherosclerosis of abdominal aorta   . Chronic respiratory failure   . PAF (paroxysmal atrial fibrillation)     a. Dx 08/2014, not a candidate for anticoag due to anemia and need for DAPT - can reconsider once she comes off Plavix.    No current facility-administered medications on file prior to encounter.   Current Outpatient Prescriptions on File Prior to Encounter  Medication Sig Dispense Refill  . acetaminophen (TYLENOL) 325 MG tablet Take 650 mg by mouth daily as needed (pain).    Marland Kitchen albuterol (PROVENTIL HFA;VENTOLIN HFA) 108 (90 BASE) MCG/ACT inhaler Inhale 2 puffs into the lungs every 4 (four) hours as needed for wheezing or shortness of breath.     Marland Kitchen amiodarone (PACERONE) 200 MG tablet Take 1 tablet (200 mg total) by mouth daily. 30 tablet 1  . aspirin EC 81 MG tablet Take 81 mg by mouth daily.    . carvedilol (COREG) 3.125 MG tablet Take 1 tablet (3.125 mg total) by mouth 2 (two) times daily with a meal. 60 tablet 1  . clopidogrel (PLAVIX) 75 MG tablet Take 1 tablet (75 mg total) by mouth  daily with breakfast. 30 tablet 6  . Coenzyme Q-10 100 MG capsule Take 100 mg by mouth daily at 2 PM daily at 2 PM.     . Melatonin 5 MG TABS Take 5 mg by mouth at bedtime as needed (sleep).    . nitroGLYCERIN (NITROSTAT) 0.4 MG SL tablet Place 1 tablet (0.4 mg  total) under the tongue every 5 (five) minutes as needed. 25 tablet 6  . potassium chloride (K-DUR,KLOR-CON) 10 MEQ tablet Take 2 tablets (20 mEq total) by mouth daily. 30 tablet 0  . pravastatin (PRAVACHOL) 80 MG tablet Take 80 mg by mouth daily.    Marland Kitchen torsemide (DEMADEX) 20 MG tablet Take 2 tablets (40 mg total) by mouth 2 (two) times daily. 120 tablet 3      No Known Allergies  Social History   Social History  . Marital Status: Widowed    Spouse Name: N/A  . Number of Children: Y  . Years of Education: N/A   Occupational History  . 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    Social History Main Topics  . Smoking status: Former Smoker -- 1.00 packs/day for 60 years    Types: Cigarettes    Quit date: 07/12/2008  . Smokeless tobacco: Never Used  . Alcohol Use: No  . Drug Use: No  . Sexual Activity: No   Other Topics Concern  . Not on file   Social History Narrative   She lives in [place]. She is retired and widowed.     Family History  Problem Relation Age of Onset  . Coronary artery disease Mother     extensive family history; several other family members.  . Heart disease Mother     CHF  . Coronary artery disease Brother   . Leukemia Sister   . Leukemia Sister   . Leukemia Father   . Colon cancer Neg Hx   . Pancreatic cancer Neg Hx   . Stomach cancer Neg Hx   . Rectal cancer Neg Hx   . Kidney cancer Brother   . Heart failure Mother   . Heart attack Sister   . Stroke Neg Hx   . Hypertension Neg Hx     PHYSICAL EXAM: Filed Vitals:   09/26/14 0315  BP: 108/58  Pulse: 56  Temp:   Resp: 19   General:  Well appearing. No respiratory difficulty HEENT: normal Neck: supple. . Carotids 2+ bilat; no bruits. No lymphadenopathy or thryomegaly appreciated. Cor: PMI nondisplaced. Regular rate & rhythm. No rubs, gallops or murmurs. Lungs: decreased air entry b/l and basal rales b/l  Abdomen: soft, nontender, nondistended. No hepatosplenomegaly. No  bruits or masses. Good bowel sounds. Extremities: edema 2 + Neuro: alert & oriented x 3, cranial nerves grossly intact. moves all 4 extremities w/o difficulty. Affect pleasant.  ECG:  Results for orders placed or performed during the hospital encounter of 09/25/14 (from the past 24 hour(s))  CBC     Status: Abnormal   Collection Time: 09/25/14 11:56 PM  Result Value Ref Range   WBC 6.5 4.0 - 10.5 K/uL   RBC 4.21 3.87 - 5.11 MIL/uL   Hemoglobin 9.5 (L) 12.0 - 15.0 g/dL   HCT 32.3 (L) 36.0 - 46.0 %   MCV 76.7 (L) 78.0 - 100.0 fL   MCH 22.6 (L) 26.0 - 34.0 pg   MCHC 29.4 (L) 30.0 - 36.0 g/dL   RDW 19.4 (H) 11.5 - 15.5 %   Platelets 299 150 - 400  K/uL  Basic metabolic panel     Status: Abnormal   Collection Time: 09/25/14 11:56 PM  Result Value Ref Range   Sodium 134 (L) 135 - 145 mmol/L   Potassium 4.6 3.5 - 5.1 mmol/L   Chloride 99 (L) 101 - 111 mmol/L   CO2 25 22 - 32 mmol/L   Glucose, Bld 108 (H) 65 - 99 mg/dL   BUN 50 (H) 6 - 20 mg/dL   Creatinine, Ser 2.90 (H) 0.44 - 1.00 mg/dL   Calcium 8.8 (L) 8.9 - 10.3 mg/dL   GFR calc non Af Amer 14 (L) >60 mL/min   GFR calc Af Amer 16 (L) >60 mL/min   Anion gap 10 5 - 15  Magnesium     Status: None   Collection Time: 09/25/14 11:56 PM  Result Value Ref Range   Magnesium 2.4 1.7 - 2.4 mg/dL  Brain natriuretic peptide     Status: Abnormal   Collection Time: 09/25/14 11:56 PM  Result Value Ref Range   B Natriuretic Peptide 3390.2 (H) 0.0 - 100.0 pg/mL  I-stat troponin, ED (not at [facility])     Status: None   Collection Time: 09/26/14 12:07 AM  Result Value Ref Range   Troponin i, poc 0.04 0.00 - 0.08 ng/mL   Comment 3           Dg Chest 2 View  09/25/2014   CLINICAL DATA:  Difficulty breathing and weakness since yesterday. Smoker.  EXAM: CHEST  2 VIEW  COMPARISON:  08/10/2014 and 08/05/2014 as well as 06/01/2014 and chest CT 06/05/2014  FINDINGS: Patient is rotated to the right. Median sternotomy wires unchanged. Lungs are adequately  inflated and demonstrate mild stable bibasilar opacification likely small effusions with associated atelectasis/ scarring. Subtle prominence of the perihilar markings likely mild vascular congestion. Stable prominence of the right hilum. Stable moderate cardiomegaly. Evidence of moderate size hiatal hernia. Remainder of the exam is unchanged.  IMPRESSION: Stable moderate cardiomegaly with suggestion of minimal vascular congestion.  Chronic bibasilar opacification compatible with small effusions and associated atelectasis/ scarring.  Moderate size hiatal hernia.   Electronically Signed   By: Marin Olp M.D.   On: 09/25/2014 23:52     ASSESSMENT: Active Problems:   Hyperlipidemia   Severe mitral regurgitation   Essential hypertension   CAD-native   COPD (chronic obstructive pulmonary disease)   CKD (chronic kidney disease), stage IV   Ischemic cardiomyopathy   Acute on chronic systolic heart failure   CHF (congestive heart failure)     PLAN/DISCUSSION: 1. IV diuresis with lasix  2. Continue home meds 3. Not AC due to anemia and only on DAPT for PAF 4. Nebs for COPD 5. I/o and daily wts and labs in am   [place]

## 2014-09-26 NOTE — Progress Notes (Signed)
  [name] is a 81 y.o. 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  She was examined this morning and rales were still noted along with peripheral edema. Vitals were reviewed as well.  She was admitted after midnight and the assessment and plan is outlined in the full H&P.   Signed, Erma Heritage, PA-C 09/26/2014, 9:18 AM Pager: [phone]

## 2014-09-27 DIAGNOSIS — I255 Ischemic cardiomyopathy: Secondary | ICD-10-CM | POA: Diagnosis not present

## 2014-09-27 DIAGNOSIS — I130 Hypertensive heart and chronic kidney disease with heart failure and stage 1 through stage 4 chronic kidney disease, or unspecified chronic kidney disease: Secondary | ICD-10-CM | POA: Diagnosis present

## 2014-09-27 DIAGNOSIS — Q600 Renal agenesis, unilateral: Secondary | ICD-10-CM | POA: Diagnosis not present

## 2014-09-27 DIAGNOSIS — I447 Left bundle-branch block, unspecified: Secondary | ICD-10-CM | POA: Diagnosis present

## 2014-09-27 DIAGNOSIS — J410 Simple chronic bronchitis: Secondary | ICD-10-CM

## 2014-09-27 DIAGNOSIS — I10 Essential (primary) hypertension: Secondary | ICD-10-CM | POA: Diagnosis not present

## 2014-09-27 DIAGNOSIS — I2510 Atherosclerotic heart disease of native coronary artery without angina pectoris: Secondary | ICD-10-CM | POA: Diagnosis not present

## 2014-09-27 DIAGNOSIS — E785 Hyperlipidemia, unspecified: Secondary | ICD-10-CM

## 2014-09-27 DIAGNOSIS — I5023 Acute on chronic systolic (congestive) heart failure: Secondary | ICD-10-CM | POA: Diagnosis not present

## 2014-09-27 DIAGNOSIS — N179 Acute kidney failure, unspecified: Secondary | ICD-10-CM | POA: Diagnosis not present

## 2014-09-27 DIAGNOSIS — I252 Old myocardial infarction: Secondary | ICD-10-CM | POA: Diagnosis not present

## 2014-09-27 DIAGNOSIS — I480 Paroxysmal atrial fibrillation: Secondary | ICD-10-CM | POA: Diagnosis present

## 2014-09-27 DIAGNOSIS — I739 Peripheral vascular disease, unspecified: Secondary | ICD-10-CM | POA: Diagnosis present

## 2014-09-27 DIAGNOSIS — I959 Hypotension, unspecified: Secondary | ICD-10-CM | POA: Diagnosis not present

## 2014-09-27 DIAGNOSIS — Z87891 Personal history of nicotine dependence: Secondary | ICD-10-CM | POA: Diagnosis not present

## 2014-09-27 DIAGNOSIS — J449 Chronic obstructive pulmonary disease, unspecified: Secondary | ICD-10-CM | POA: Diagnosis present

## 2014-09-27 DIAGNOSIS — I340 Nonrheumatic mitral (valve) insufficiency: Secondary | ICD-10-CM | POA: Diagnosis present

## 2014-09-27 DIAGNOSIS — Z7982 Long term (current) use of aspirin: Secondary | ICD-10-CM | POA: Diagnosis not present

## 2014-09-27 DIAGNOSIS — Z9861 Coronary angioplasty status: Secondary | ICD-10-CM | POA: Diagnosis not present

## 2014-09-27 DIAGNOSIS — Z79899 Other long term (current) drug therapy: Secondary | ICD-10-CM | POA: Diagnosis not present

## 2014-09-27 DIAGNOSIS — I083 Combined rheumatic disorders of mitral, aortic and tricuspid valves: Secondary | ICD-10-CM | POA: Diagnosis present

## 2014-09-27 DIAGNOSIS — N184 Chronic kidney disease, stage 4 (severe): Secondary | ICD-10-CM

## 2014-09-27 DIAGNOSIS — Z9981 Dependence on supplemental oxygen: Secondary | ICD-10-CM | POA: Diagnosis not present

## 2014-09-27 DIAGNOSIS — Z951 Presence of aortocoronary bypass graft: Secondary | ICD-10-CM | POA: Diagnosis not present

## 2014-09-27 DIAGNOSIS — I080 Rheumatic disorders of both mitral and aortic valves: Secondary | ICD-10-CM

## 2014-09-27 DIAGNOSIS — D649 Anemia, unspecified: Secondary | ICD-10-CM | POA: Diagnosis present

## 2014-09-27 MED ORDER — FUROSEMIDE 10 MG/ML IJ SOLN
40.0000 mg | Freq: Four times a day (QID) | INTRAMUSCULAR | Status: DC
  Administered 2014-09-27 – 2014-09-30 (×11): 40 mg via INTRAVENOUS
  Filled 2014-09-27 (×11): qty 4

## 2014-09-27 MED ORDER — FUROSEMIDE 10 MG/ML IJ SOLN
40.0000 mg | Freq: Once | INTRAMUSCULAR | Status: AC
  Administered 2014-09-27: 40 mg via INTRAVENOUS
  Filled 2014-09-27: qty 4

## 2014-09-27 MED ORDER — IPRATROPIUM-ALBUTEROL 0.5-2.5 (3) MG/3ML IN SOLN
3.0000 mL | Freq: Four times a day (QID) | RESPIRATORY_TRACT | Status: DC
  Administered 2014-09-27 – 2014-09-29 (×6): 3 mL via RESPIRATORY_TRACT
  Filled 2014-09-27 (×7): qty 3

## 2014-09-27 MED ORDER — FUROSEMIDE 10 MG/ML IJ SOLN: 80.0000 mg | Freq: Two times a day (BID) | INTRAMUSCULAR | Status: DC

## 2014-09-27 NOTE — Progress Notes (Signed)
Initial Nutrition Assessment  DOCUMENTATION CODES:   Severe malnutrition in context of chronic illness  INTERVENTION:    Magic cup TID with meals, each supplement provides 290 kcal and 9 grams of protein  Snacks TID between meals  NUTRITION DIAGNOSIS:   Malnutrition related to chronic illness as evidenced by severe depletion of body fat, severe depletion of muscle mass.  GOAL:   Patient will meet greater than or equal to 90% of their needs  MONITOR:   PO intake, Labs, Weight trends  REASON FOR ASSESSMENT:   Malnutrition Screening Tool    ASSESSMENT:   81 y.o. 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 Came to ED with c/o worsening shortness of breath in last 2-3 days. ED work-up shows worsening heart failure.  Nutrition-Focused physical exam completed. Findings are severe fat depletion, severe muscle depletion, and moderate edema. Patient reports a lot of weight loss recently, some related to fluids. She has never had a good appetite and it is just worse now that she is on a lot of medications. She does not like Ensure or Boost supplements. Agreed to try Magic Cups and snacks between meals.  Diet Order:  Diet 2 gram sodium Room service appropriate?: Yes; Fluid consistency:: Thin  Skin:  Reviewed, no issues  Last BM:  unknown  Height:   Ht Readings from Last 1 Encounters:  09/25/14 5\' 8"  (1.727 m)    Weight:   Wt Readings from Last 1 Encounters:  09/27/14 125 lb (56.7 kg)    Ideal Body Weight:  63.6 kg  BMI:  Body mass index is 19.01 kg/(m^2).  Estimated Nutritional Needs:   Kcal:  1500-1700  Protein:  75-90 gm  Fluid:  1.5-1.7 L  EDUCATION NEEDS:   Education needs addressed  Molli Barrows, [place], [place], [place] Pager [phone] After Hours Pager [phone]

## 2014-09-27 NOTE — Progress Notes (Signed)
Patient Name: [name] Date of Encounter: 09/27/2014  Primary Cardiologist: Dr. Aundra Dubin   Active Problems:   Hyperlipidemia   Severe mitral regurgitation   Essential hypertension   CAD-native   COPD (chronic obstructive pulmonary disease)   CKD (chronic kidney disease), stage IV   Ischemic cardiomyopathy   Acute on chronic systolic heart failure   CHF (congestive heart failure)    SUBJECTIVE  Still SOB, not much changed. Denies any CP.   CURRENT MEDS . amiodarone  200 mg Oral Daily  . antiseptic oral rinse  7 mL Mouth Rinse BID  . aspirin EC  81 mg Oral Daily  . carvedilol  3.125 mg Oral BID WC  . clopidogrel  75 mg Oral Q breakfast  . furosemide  40 mg Intravenous Q12H  . heparin  5,000 Units Subcutaneous 3 times per day  . potassium chloride  20 mEq Oral Daily  . pravastatin  80 mg Oral q1800  . sodium chloride  3 mL Intravenous Q12H    OBJECTIVE  Filed Vitals:   09/27/14 0400 09/27/14 0415 09/27/14 0537 09/27/14 0730  BP:  147/133 103/37   Pulse:   56   Temp: 97.8 F (36.6 C)   97.4 F (36.3 C)  TempSrc: Oral   Oral  Resp:  16 23   Height:      Weight: 125 lb (56.7 kg)     SpO2:   100%     Intake/Output Summary (Last 24 hours) at 09/27/14 1210 Last data filed at 09/27/14 1038  Gross per 24 hour  Intake    460 ml  Output   1350 ml  Net   -890 ml   Filed Weights   09/25/14 2251 09/27/14 0400  Weight: 121 lb (54.885 kg) 125 lb (56.7 kg)    PHYSICAL EXAM  General: Pleasant, NAD. Neuro: Alert and oriented X 3. Moves all extremities spontaneously. Psych: Normal affect. HEENT:  Normal  Neck: Supple without bruits or JVD. Lungs:  Resp regular and unlabored. Largely CTA, mildly diminished breath sound in bilateral bases. Heart: RRR no s3, s4. 2/6 systolic murmur Abdomen: Soft, non-tender, non-distended, BS + x 4.  Extremities: No clubbing, cyanosis. DP/PT/Radials 2+ and equal bilaterally. 2+ pitting edema in bilateral LE  Accessory  Clinical Findings  CBC  Recent Labs  09/25/14 2356 09/26/14 0508  WBC 6.5 6.6  HGB 9.5* 9.6*  HCT 32.3* 32.5*  MCV 76.7* 77.0*  PLT 299 381   Basic Metabolic Panel  Recent Labs  09/25/14 2356 09/26/14 0508  NA 134* 136  K 4.6 4.2  CL 99* 99*  CO2 25 27  GLUCOSE 108* 95  BUN 50* 48*  CREATININE 2.90* 2.92*  CALCIUM 8.8* 8.7*  MG 2.4  --     TELE NSR with BBB and HR 50s    ECG  No new EKG  Echocardiogram 08/04/2014  LV EF: 30%  ------------------------------------------------------------------- Indications:   CHF - 428.0.  ------------------------------------------------------------------- History:  PMH:  Coronary artery disease. Congestive heart failure. Aortic valve disease. Mitral valve disease. Chronic obstructive pulmonary disease. PMH:  Myocardial infarction. Risk factors: Former tobacco use. Hypertension.  ------------------------------------------------------------------- Study Conclusions  - Left ventricle: The cavity size was moderately dilated. Wall thickness was normal. The estimated ejection fraction was 30%. Diffuse hypokinesis. Findings consistent with left ventricular diastolic dysfunction. Doppler parameters are consistent with high ventricular filling pressure. - Aortic valve: Leaflets thickened and calcified, Mild AS. Mild AI. - Mitral valve: Mildly thickened leaflets . There  was severe regurgitation. - Left atrium: The atrium was severely dilated. - Right ventricle: The cavity size was mildly dilated. Systolic function was mildly reduced. - Right atrium: The atrium was mildly dilated. - Tricuspid valve: There was mild-moderate regurgitation. - Pulmonary arteries: PA peak pressure: 35 mm Hg (S). - Pericardium, extracardiac: There was a left pleural effusion.     Radiology/Studies  Dg Chest 2 View  09/25/2014   CLINICAL DATA:  Difficulty breathing and weakness since yesterday. Smoker.  EXAM: CHEST  2  VIEW  COMPARISON:  08/10/2014 and 08/05/2014 as well as 06/01/2014 and chest CT 06/05/2014  FINDINGS: Patient is rotated to the right. Median sternotomy wires unchanged. Lungs are adequately inflated and demonstrate mild stable bibasilar opacification likely small effusions with associated atelectasis/ scarring. Subtle prominence of the perihilar markings likely mild vascular congestion. Stable prominence of the right hilum. Stable moderate cardiomegaly. Evidence of moderate size hiatal hernia. Remainder of the exam is unchanged.  IMPRESSION: Stable moderate cardiomegaly with suggestion of minimal vascular congestion.  Chronic bibasilar opacification compatible with small effusions and associated atelectasis/ scarring.  Moderate size hiatal hernia.   Electronically Signed   By: Marin Olp M.D.   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 Acute on chronic systolic HF with ICM (EF 09-38% by cath 07/2014)  - still fluid overloaded, 2+pitting edema in LE, has more R heart symptom, only mild decreased breath sound in bibasilar region. Did not put out much urine on 40mg  BID IV lasix, will increase to 80mg  BID today. Unfortunately with severe MR, she may always has some SOB.   2. CAD s/p CABG in 09/2008 (LIMA to LAD and free RIMA Y graft to OM off of LIMA with aortic root replacement)  - last cath 08/06/2014 patent DES to prox RCA, 70% prox to mid LCx, 50% prox to mid LAD, free RIMA arise from mid LIMA graft widely patent, 90% ost OM1, 75% D1, EF 15-20%. Medical therapy   3. HTN 4. HLD 5. tobacco abuse 6. COPD 7. 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 PAD 9. anemia   10. severe MR  - per cath report 08/06/2014  contemplate mitral clip but with significant deterioration in LV function, impact on symptom may be minimal.   Signed, Woodward Ku Pager: [phone]   The patient was seen, examined and discussed with Almyra Deforest, PA-C and I agree with the above.   81 year old female with knwn CAD, s/p CABG in 2010, CKD stage IV, admitted with acute on chronic systolic CHF, LVEF 16%, mild RV dysfunction, BNP 3390, Crea 2.9. Started on lasix 40 mg iv BID, minimal diuresis overnight, we will increase to 40 mg iv Q6H. She is significantly SOB, we will start breathing treatment.  Dorothy Spark 09/27/2014

## 2014-09-27 NOTE — Care Management Note (Addendum)
Case Management Note  Patient Details  Name: [name] MRN: [id number] Date of Birth: 07/27/1927  Subjective/Objective: Pt admitted on 09/25/2014 with acute systolic CHF exacerbation. Pt is from home alone with support of daughter.                     Action/Plan: Pt has [employer] at home for DME. Per pt she had 02 up to one month ago. She was using [facility] for DME 02 needs and they picked the tank up. Pt will need to be re-qualified for home 02 . CM will continue to monitor.    Expected Discharge Date:                  Expected Discharge Plan:  Home/Self Care  In-House Referral:  NA  Discharge planning Services  CM Consult  Post Acute Care Choice:  NA Choice offered to:  NA  DME Arranged:    DME Agency:     HH Arranged:  NA HH Agency:  NA  Status of Service:  In process, will continue to follow  Medicare Important Message Given:    Date Medicare IM Given:    Medicare IM give by:    Date Additional Medicare IM Given:    Additional Medicare Important Message give by:     If discussed at [place] of Stay Meetings, dates discussed:    Additional Comments:  Bethena Roys, RN 09/27/2014, 11:41 AM

## 2014-09-27 NOTE — Progress Notes (Signed)
Patient given heparin injection at 2128, gauze applied. Patient called at 2245 to say she was bleeding. It had soaked through gauze, gown and blanket. Pressure applied and held for 10 min and gauze dressing applied. Bleeding stopped. Will continue to monitor.

## 2014-09-28 NOTE — Progress Notes (Signed)
Patient Name: [name] Date of Encounter: 09/28/2014  Primary Cardiologist: Dr. Aundra Dubin   Active Problems:   Hyperlipidemia   Severe mitral regurgitation   Essential hypertension   CAD-native   COPD (chronic obstructive pulmonary disease)   CKD (chronic kidney disease), stage IV   Ischemic cardiomyopathy   Acute on chronic systolic heart failure   CHF (congestive heart failure)    SUBJECTIVE  She feels much better today, improved SOB.   CURRENT MEDS . amiodarone  200 mg Oral Daily  . antiseptic oral rinse  7 mL Mouth Rinse BID  . aspirin EC  81 mg Oral Daily  . carvedilol  3.125 mg Oral BID WC  . clopidogrel  75 mg Oral Q breakfast  . furosemide  40 mg Intravenous Q6H  . heparin  5,000 Units Subcutaneous 3 times per day  . ipratropium-albuterol  3 mL Nebulization Q6H  . potassium chloride  20 mEq Oral Daily  . pravastatin  80 mg Oral q1800  . sodium chloride  3 mL Intravenous Q12H   OBJECTIVE  Filed Vitals:   09/28/14 0330 09/28/14 0432 09/28/14 0807 09/28/14 1242  BP: 109/53  125/55 109/51  Pulse: 58  60 59  Temp:  97.7 F (36.5 C) 97.6 F (36.4 C) 97.8 F (36.6 C)  TempSrc:  Oral Oral Oral  Resp: 19  22 20   Height:      Weight:  124 lb 12.8 oz (56.609 kg)    SpO2: 96%  99% 91%    Intake/Output Summary (Last 24 hours) at 09/28/14 1319 Last data filed at 09/28/14 1241  Gross per 24 hour  Intake    800 ml  Output   1950 ml  Net  -1150 ml   Filed Weights   09/25/14 2251 09/27/14 0400 09/28/14 0432  Weight: 121 lb (54.885 kg) 125 lb (56.7 kg) 124 lb 12.8 oz (56.609 kg)   PHYSICAL EXAM  General: Pleasant, NAD. Neuro: Alert and oriented X 3. Moves all extremities spontaneously. Psych: Normal affect. HEENT:  Normal  Neck: Supple without bruits or JVD. Lungs:  Resp regular and unlabored. Largely CTA, mildly diminished breath sound in bilateral bases. Heart: RRR no s3, s4. 2/6 systolic murmur Abdomen: Soft, non-tender, non-distended, BS + x  4.  Extremities: No clubbing, cyanosis. DP/PT/Radials 2+ and equal bilaterally. 1+ pitting edema in bilateral LE  Accessory Clinical Findings  CBC  Recent Labs  09/25/14 2356 09/26/14 0508  WBC 6.5 6.6  HGB 9.5* 9.6*  HCT 32.3* 32.5*  MCV 76.7* 77.0*  PLT 299 811   Basic Metabolic Panel  Recent Labs  09/25/14 2356 09/26/14 0508  NA 134* 136  K 4.6 4.2  CL 99* 99*  CO2 25 27  GLUCOSE 108* 95  BUN 50* 48*  CREATININE 2.90* 2.92*  CALCIUM 8.8* 8.7*  MG 2.4  --    TELE NSR with BBB and HR 50s   ECG  No new EKG  Echocardiogram 08/04/2014  LV EF: 30%  ------------------------------------------------------------------- Indications:   CHF - 428.0.  ------------------------------------------------------------------- History:  PMH:  Coronary artery disease. Congestive heart failure. Aortic valve disease. Mitral valve disease. Chronic obstructive pulmonary disease. PMH:  Myocardial infarction. Risk factors: Former tobacco use. Hypertension.  ------------------------------------------------------------------- Study Conclusions  - Left ventricle: The cavity size was moderately dilated. Wall thickness was normal. The estimated ejection fraction was 30%. Diffuse hypokinesis. Findings consistent with left ventricular diastolic dysfunction. Doppler parameters are consistent with high ventricular filling pressure. - Aortic valve: Leaflets  thickened and calcified, Mild AS. Mild AI. - Mitral valve: Mildly thickened leaflets . There was severe regurgitation. - Left atrium: The atrium was severely dilated. - Right ventricle: The cavity size was mildly dilated. Systolic function was mildly reduced. - Right atrium: The atrium was mildly dilated. - Tricuspid valve: There was mild-moderate regurgitation. - Pulmonary arteries: PA peak pressure: 35 mm Hg (S). - Pericardium, extracardiac: There was a left pleural effusion.      Radiology/Studies  Dg Chest 2 View  09/25/2014   CLINICAL DATA:  Difficulty breathing and weakness since yesterday. Smoker.  EXAM: CHEST  2 VIEW  COMPARISON:  08/10/2014 and 08/05/2014 as well as 06/01/2014 and chest CT 06/05/2014  FINDINGS: Patient is rotated to the right. Median sternotomy wires unchanged. Lungs are adequately inflated and demonstrate mild stable bibasilar opacification likely small effusions with associated atelectasis/ scarring. Subtle prominence of the perihilar markings likely mild vascular congestion. Stable prominence of the right hilum. Stable moderate cardiomegaly. Evidence of moderate size hiatal hernia. Remainder of the exam is unchanged.  IMPRESSION: Stable moderate cardiomegaly with suggestion of minimal vascular congestion.  Chronic bibasilar opacification compatible with small effusions and associated atelectasis/ scarring.  Moderate size hiatal hernia.   Electronically Signed   By: Marin Olp M.D.   On: 09/25/2014 23:52    ASSESSMENT AND PLAN  1. Acute on chronic systolic HF with ICM (EF 64-15% by cath 07/2014)  - still fluid overloaded, 2+pitting edema in LE, has more R heart symptom, only mild decreased breath sound in bibasilar region. Did not put out much urine on 40mg  BID IV lasix, will increase to 80mg  BID today. Unfortunately with severe MR, she may always has some SOB.   2. CAD s/p CABG in 09/2008 (LIMA to LAD and free RIMA Y graft to OM off of LIMA with aortic root replacement)  - last cath 08/06/2014 patent DES to prox RCA, 70% prox to mid LCx, 50% prox to mid LAD, free RIMA arise from mid LIMA graft widely patent, 90% ost OM1, 75% D1, EF 15-20%. Medical therapy   3. HTN 4. HLD 5. tobacco abuse 6. COPD 7. 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 PAD 9. anemia   10. severe MR  - per cath report 08/06/2014 contemplate mitral clip but with significant deterioration in LV function, impact on symptom may be minimal.   81 year old female with known  CAD, s/p CABG in 2010, CKD stage IV, admitted with acute on chronic systolic CHF, LVEF 83%, mild RV dysfunction, BNP 3390, Crea 2.9. Started on lasix 40 mg iv BID, minimal diuresis overnight, increased to 40 mg iv Q6H yesterday and started on breathing treatment with significant improvement overnight but still fluid overloaded. I would continue the same dose of diuretics for another day. Anticipated discharge this weekend.   Dorothy Spark 09/28/2014

## 2014-09-29 LAB — BASIC METABOLIC PANEL
Anion gap: 9 (ref 5–15)
BUN: 40 mg/dL — ABNORMAL HIGH (ref 6–20)
CO2: 29 mmol/L (ref 22–32)
Calcium: 8.5 mg/dL — ABNORMAL LOW (ref 8.9–10.3)
Chloride: 98 mmol/L — ABNORMAL LOW (ref 101–111)
Creatinine, Ser: 2.46 mg/dL — ABNORMAL HIGH (ref 0.44–1.00)
GFR calc Af Amer: 19 mL/min — ABNORMAL LOW (ref >60)
GFR calc non Af Amer: 17 mL/min — ABNORMAL LOW (ref >60)
Glucose, Bld: 164 mg/dL — ABNORMAL HIGH (ref 65–99)
Potassium: 3.7 mmol/L (ref 3.5–5.1)
Sodium: 136 mmol/L (ref 135–145)

## 2014-09-29 MED ORDER — SENNA 8.6 MG PO TABS
2.0000 | ORAL_TABLET | Freq: Every day | ORAL | Status: DC | PRN
  Administered 2014-09-29: 8.6 mg via ORAL
  Filled 2014-09-29: qty 2

## 2014-09-29 NOTE — Progress Notes (Signed)
Subjective:  Feel some nausea this morning but overall her breathing is improved. No chest pain.  Objective:  Vital Signs in the last 24 hours: Temp:  [97.6 F (36.4 C)-98.2 F (36.8 C)] 97.6 F (36.4 C) (09/17 0730) Pulse Rate:  [57-69] 63 (09/17 0736) Resp:  [19-22] 19 (09/17 0451) BP: (101-116)/(49-63) 113/61 mmHg (09/17 0736) SpO2:  [91 %-99 %] 98 % (09/17 0451) Weight:  [112 lb 10.5 oz (51.1 kg)] 112 lb 10.5 oz (51.1 kg) (09/17 0451)  Intake/Output from previous day: 09/16 0701 - 09/17 0700 In: 800 [P.O.:800] Out: 1600 [Urine:1600]   Physical Exam: General: Thin, elderly, in no acute distress. Head:  Normocephalic and atraumatic. Lungs: Clear to auscultation and percussion, however diminished at the bases, mild crackles. Heart: Normal S1 and S2.  2/6 holosystolic left lower sternal border murmur, no rubs or gallops.  Abdomen: soft, non-tender, positive bowel sounds. Extremities: No clubbing or cyanosis. No edema, resolved Neurologic: Alert and oriented x 3.    Lab Results: No results for input(s): WBC, HGB, PLT in the last 72 hours.  Recent Labs  09/29/14 0830  NA 136  K 3.7  CL 98*  CO2 29  GLUCOSE 164*  BUN 40*  CREATININE 2.46*   Telemetry: No adverse arrhythmias, sinus rhythm Personally viewed.   Scheduled Meds: . amiodarone  200 mg Oral Daily  . antiseptic oral rinse  7 mL Mouth Rinse BID  . aspirin EC  81 mg Oral Daily  . carvedilol  3.125 mg Oral BID WC  . clopidogrel  75 mg Oral Q breakfast  . furosemide  40 mg Intravenous Q6H  . ipratropium-albuterol  3 mL Nebulization Q6H  . potassium chloride  20 mEq Oral Daily  . pravastatin  80 mg Oral q1800  . sodium chloride  3 mL Intravenous Q12H   Continuous Infusions:  PRN Meds:.ipratropium-albuterol, zolpidem  Assessment/Plan:  Active Problems:   Hyperlipidemia   Severe mitral regurgitation   Essential hypertension   CAD-native   COPD (chronic obstructive pulmonary disease)   CKD  (chronic kidney disease), stage IV   Ischemic cardiomyopathy   Acute on chronic systolic heart failure   CHF (congestive heart failure)  81 year old female with acute on chronic systolic heart failure ejection fraction 15% on cardiac catheterization 07/2014 with severe mitral regurgitation.  1. Acute on chronic systolic heart failure  - We will continue with IV Lasix today at dose as above. Improving. Watching creatinine closely. Currently improved at 2.4  - BNP 3390, creatinine 2.9 on admission.  2. Severe mitral regurgitation  - Prior discussion of possible mitral clip  - May be too high risk.  3. Coronary artery disease  - s/p CABG in 09/2008 (LIMA to LAD and free RIMA Y graft to OM off of LIMA with aortic root replacement) - last cath 08/06/2014 patent DES to prox RCA, 70% prox to mid LCx, 50% prox to mid LAD, free RIMA arise from mid LIMA graft widely patent, 90% ost OM1, 75% D1, EF 15-20%. Medical therapy  4. Essential hypertension-currently controlled  5. Chronic kidney disease stage IV  - Watch diuresis closely.  Given her severity of cardiomyopathy, severe mitral regurgitation, CAD discussion of advanced directives should continue.  5. Peripheral vascular disease-stable.  6. Paroxysmal atrial fibrillation - Amiodarone 200 mg a day. Stable. She is not on anticoagulation because of her high risk of bleeding. This has been addressed during prior visits.  Potential discharge tomorrow if feeling better.   SKAINS, MARK  09/29/2014, 10:29 AM

## 2014-09-29 NOTE — Progress Notes (Signed)
SATURATION QUALIFICATIONS: (This note is used to comply with regulatory documentation for home oxygen)  Patient Saturations on Room Air at Rest =91  Patient Saturations on Room Air while Ambulating = 87%   Patient Saturations on 2 Liters of oxygen while Ambulating = 94%  Please briefly explain why patient needs home oxygen: Oxygen saturations dropped while ambulating.

## 2014-09-30 DIAGNOSIS — R531 Weakness: Secondary | ICD-10-CM

## 2014-09-30 LAB — BASIC METABOLIC PANEL
Anion gap: 10 (ref 5–15)
BUN: 43 mg/dL — ABNORMAL HIGH (ref 6–20)
CO2: 28 mmol/L (ref 22–32)
Calcium: 8.6 mg/dL — ABNORMAL LOW (ref 8.9–10.3)
Chloride: 100 mmol/L — ABNORMAL LOW (ref 101–111)
Creatinine, Ser: 2.17 mg/dL — ABNORMAL HIGH (ref 0.44–1.00)
GFR calc Af Amer: 23 mL/min — ABNORMAL LOW (ref >60)
GFR calc non Af Amer: 20 mL/min — ABNORMAL LOW (ref >60)
Glucose, Bld: 92 mg/dL (ref 65–99)
Potassium: 3.6 mmol/L (ref 3.5–5.1)
Sodium: 138 mmol/L (ref 135–145)

## 2014-09-30 MED ORDER — TORSEMIDE 20 MG PO TABS
40.0000 mg | ORAL_TABLET | Freq: Every evening | ORAL | Status: DC
  Administered 2014-09-30: 40 mg via ORAL
  Filled 2014-09-30: qty 2

## 2014-09-30 MED ORDER — TORSEMIDE 20 MG PO TABS
60.0000 mg | ORAL_TABLET | Freq: Every morning | ORAL | Status: DC
  Administered 2014-09-30 – 2014-10-01 (×2): 60 mg via ORAL
  Filled 2014-09-30 (×2): qty 3

## 2014-09-30 NOTE — Progress Notes (Signed)
Patient Name: [name] Date of Encounter: 09/30/2014    Principal Problem:   Acute on chronic systolic heart failure Active Problems:   Severe mitral regurgitation   Ischemic cardiomyopathy   Essential hypertension   CAD-native   CKD (chronic kidney disease), stage IV   Paroxysmal atrial fibrillation   Hyperlipidemia   COPD (chronic obstructive pulmonary disease)    SUBJECTIVE  Breathing much improved compared to admission.  Slept well last night - relatively flat.  Hasn't gotten out of bed much - says legs were very weak yesterday.  Desaturated with ambulation to 87% on RA.  Was using O2 prn @ home previously.  CURRENT MEDS . amiodarone  200 mg Oral Daily  . antiseptic oral rinse  7 mL Mouth Rinse BID  . aspirin EC  81 mg Oral Daily  . carvedilol  3.125 mg Oral BID WC  . clopidogrel  75 mg Oral Q breakfast  . furosemide  40 mg Intravenous Q6H  . potassium chloride  20 mEq Oral Daily  . pravastatin  80 mg Oral q1800  . sodium chloride  3 mL Intravenous Q12H    OBJECTIVE  Filed Vitals:   09/30/14 0045 09/30/14 0258 09/30/14 0412 09/30/14 0741  BP: 114/54 112/50 105/66 117/98  Pulse: 63 59 59 63  Temp: 98.1 F (36.7 C) 97.9 F (36.6 C) 97.5 F (36.4 C)   TempSrc: Oral Oral Oral   Resp: 18 20 19    Height:      Weight:   120 lb 11.2 oz (54.749 kg)   SpO2: 98% 89% 96%     Intake/Output Summary (Last 24 hours) at 09/30/14 0833 Last data filed at 09/30/14 0820  Gross per 24 hour  Intake    603 ml  Output   1750 ml  Net  -1147 ml   Filed Weights   09/28/14 0432 09/29/14 0451 09/30/14 0412  Weight: 124 lb 12.8 oz (56.609 kg) 112 lb 10.5 oz (51.1 kg) 120 lb 11.2 oz (54.749 kg)    PHYSICAL EXAM  General: Pleasant, NAD. Neuro: Alert and oriented X 3. Moves all extremities spontaneously. Psych: Normal affect. HEENT:  Normal  Neck: Supple without JVD.  L bruit. Lungs:  Resp regular and unlabored, bibasilar crackles with intermittent exp  wheezing. Heart: RRR no s3, s4, 3/6 syst murmur loudest @ apex - heard throughout. Abdomen: Soft, non-tender, non-distended, BS + x 4.  Extremities: No clubbing, cyanosis.  Trace bilat ankle edema. DP/PT/Radials 1+ and equal bilaterally.  Accessory Clinical Findings  Basic Metabolic Panel  Recent Labs  09/29/14 0830 09/30/14 0340  NA 136 138  K 3.7 3.6  CL 98* 100*  CO2 29 28  GLUCOSE 164* 92  BUN 40* 43*  CREATININE 2.46* 2.17*  CALCIUM 8.5* 8.6*   TELE  RSR  Radiology/Studies  Dg Chest 2 View  09/25/2014   CLINICAL DATA:  Difficulty breathing and weakness since yesterday. Smoker.  EXAM: CHEST  2 VIEW  COMPARISON:  08/10/2014 and 08/05/2014 as well as 06/01/2014 and chest CT 06/05/2014  FINDINGS: Patient is rotated to the right. Median sternotomy wires unchanged. Lungs are adequately inflated and demonstrate mild stable bibasilar opacification likely small effusions with associated atelectasis/ scarring. Subtle prominence of the perihilar markings likely mild vascular congestion. Stable prominence of the right hilum. Stable moderate cardiomegaly. Evidence of moderate size hiatal hernia. Remainder of the exam is unchanged.  IMPRESSION: Stable moderate cardiomegaly with suggestion of minimal vascular congestion.  Chronic bibasilar opacification compatible with  small effusions and associated atelectasis/ scarring.  Moderate size hiatal hernia.   Electronically Signed   By: Marin Olp M.D.   On: 09/25/2014 23:52    ASSESSMENT AND PLAN  1. Acute on chronic systolic heart failure/ICM - Breathing much better, though very weak with ambulation yesterday.  Also desaturated to 87% on RA.  - Minus 3.4 L since admission.  Wt down to 120 lbs (recorded @ 112 lbs yesterday - presume this was an error).  Prev d/c wt 119 lbs.  - BNP 3390, creatinine 2.9 on admission. Creat 2.17 this AM. - IV lasix this AM - can switch to PO torsemide after - was taking 40 BID @ home previously - ? Change to  60 in the AM, 40 in the PM - will d/w Dr. Marlou Porch. - Will ask PT to assess given deconditioning.  Pt lives @ home with her daughter in law currently. - Cont bb.  Not on acei/arb/arni/spiro in setting of CKD IV.  BP relatively soft - not sure she'd tolerate addition of hydralazine/nitrates.  2. Severe mitral regurgitation  - Prior discussion of possible mitral clip - felt to be too high risk.  3. Coronary artery disease  - s/p CABG in 09/2008 (LIMA to LAD and free RIMA Y graft to OM off of LIMA with aortic root replacement)  - last cath 08/06/2014 patent DES to prox RCA, 70% prox to mid LCx, 50% prox to mid LAD, free RIMA arise from mid LIMA graft widely patent, 90% ost OM1, 75% D1, EF 15-20%. Medical therapy. - No chest pain. - Cont asa, plavix, bb, statin.  4. Essential hypertension - Stable.  5. Chronic kidney disease stage IV  - Stable - tolerating diuresis well.  6. Peripheral vascular disease - no complaints.  - cont asa, statin.  7. Paroxysmal atrial fibrillation - In sinus on Amiodarone 200 mg a day.  - She is not on anticoagulation because of her high risk of bleeding. This has been addressed during prior visits and she remains on asa/plavix.  Signed, Murray Hodgkins NP  Personally seen and examined. Agree with above. She feels a little too weak to go home today. Physical therapy ordered. I agree with transition to by mouth torsemide 60 a.m., 40 p.m. as she was taking 40 twice a day previously. Encourage ambulation. Hopefully home tomorrow.  [name], MD

## 2014-10-01 ENCOUNTER — Encounter (HOSPITAL_COMMUNITY): Payer: Medicare Other

## 2014-10-01 MED ORDER — TORSEMIDE 20 MG PO TABS: ORAL_TABLET | ORAL | Status: DC

## 2014-10-01 NOTE — Progress Notes (Signed)
Patient Name: [name] Date of Encounter: 10/01/2014  Primary Cardiologist: Dr. Aundra Dubin   Principal Problem:   Acute on chronic systolic heart failure Active Problems:   Hyperlipidemia   Severe mitral regurgitation   Essential hypertension   CAD-native   COPD (chronic obstructive pulmonary disease)   CKD (chronic kidney disease), stage IV   Ischemic cardiomyopathy   Paroxysmal atrial fibrillation    SUBJECTIVE  Denies CP, SOB improving.   CURRENT MEDS . amiodarone  200 mg Oral Daily  . antiseptic oral rinse  7 mL Mouth Rinse BID  . aspirin EC  81 mg Oral Daily  . carvedilol  3.125 mg Oral BID WC  . clopidogrel  75 mg Oral Q breakfast  . potassium chloride  20 mEq Oral Daily  . pravastatin  80 mg Oral q1800  . sodium chloride  3 mL Intravenous Q12H  . torsemide  40 mg Oral QPM  . torsemide  60 mg Oral q morning - 10a   OBJECTIVE  Filed Vitals:   09/30/14 1933 09/30/14 2145 09/30/14 2320 10/01/14 0416  BP: 110/49 107/65 111/68 107/57  Pulse: 62 60 57 57  Temp: 98 F (36.7 C)  97.8 F (36.6 C) 97.9 F (36.6 C)  TempSrc: Oral  Oral Oral  Resp: 22 19 22 20   Height:      Weight:      SpO2: 99% 97% 98% 98%    Intake/Output Summary (Last 24 hours) at 10/01/14 0843 Last data filed at 10/01/14 0500  Gross per 24 hour  Intake    603 ml  Output   1452 ml  Net   -849 ml   Filed Weights   09/28/14 0432 09/29/14 0451 09/30/14 0412  Weight: 124 lb 12.8 oz (56.609 kg) 112 lb 10.5 oz (51.1 kg) 120 lb 11.2 oz (54.749 kg)   PHYSICAL EXAM  General: Pleasant, NAD. Neuro: Alert and oriented X 3. Moves all extremities spontaneously. Psych: Normal affect. HEENT:  Normal  Neck: Supple without bruits or JVD. Lungs:  Resp regular and unlabored. CTA Heart: RRR no s3, s4. 2/6 systolic murmur Abdomen: Soft, non-tender, non-distended, BS + x 4.  Extremities: No clubbing, cyanosis. DP/PT/Radials 2+ and equal bilaterally. No significant edema.   Accessory Clinical  Findings  CBC No results for input(s): WBC, NEUTROABS, HGB, HCT, MCV, PLT in the last 72 hours. Basic Metabolic Panel  Recent Labs  09/29/14 0830 09/30/14 0340  NA 136 138  K 3.7 3.6  CL 98* 100*  CO2 29 28  GLUCOSE 164* 92  BUN 40* 43*  CREATININE 2.46* 2.17*  CALCIUM 8.5* 8.6*   TELE NSR with BBB and HR 50s   ECG  No new EKG  Echocardiogram 08/04/2014  LV EF: 30%  ------------------------------------------------------------------- Indications:   CHF - 428.0.  ------------------------------------------------------------------- History:  PMH:  Coronary artery disease. Congestive heart failure. Aortic valve disease. Mitral valve disease. Chronic obstructive pulmonary disease. PMH:  Myocardial infarction. Risk factors: Former tobacco use. Hypertension.  ------------------------------------------------------------------- Study Conclusions  - Left ventricle: The cavity size was moderately dilated. Wall thickness was normal. The estimated ejection fraction was 30%. Diffuse hypokinesis. Findings consistent with left ventricular diastolic dysfunction. Doppler parameters are consistent with high ventricular filling pressure. - Aortic valve: Leaflets thickened and calcified, Mild AS. Mild AI. - Mitral valve: Mildly thickened leaflets . There was severe regurgitation. - Left atrium: The atrium was severely dilated. - Right ventricle: The cavity size was mildly dilated. Systolic function was mildly reduced. -  Right atrium: The atrium was mildly dilated. - Tricuspid valve: There was mild-moderate regurgitation. - Pulmonary arteries: PA peak pressure: 35 mm Hg (S). - Pericardium, extracardiac: There was a left pleural effusion.     Radiology/Studies  Dg Chest 2 View  09/25/2014   CLINICAL DATA:  Difficulty breathing and weakness since yesterday. Smoker.  EXAM: CHEST  2 VIEW  COMPARISON:  08/10/2014 and 08/05/2014 as well as 06/01/2014 and  chest CT 06/05/2014  FINDINGS: Patient is rotated to the right. Median sternotomy wires unchanged. Lungs are adequately inflated and demonstrate mild stable bibasilar opacification likely small effusions with associated atelectasis/ scarring. Subtle prominence of the perihilar markings likely mild vascular congestion. Stable prominence of the right hilum. Stable moderate cardiomegaly. Evidence of moderate size hiatal hernia. Remainder of the exam is unchanged.  IMPRESSION: Stable moderate cardiomegaly with suggestion of minimal vascular congestion.  Chronic bibasilar opacification compatible with small effusions and associated atelectasis/ scarring.  Moderate size hiatal hernia.   Electronically Signed   By: Marin Olp M.D.   On: 09/25/2014 23:52    ASSESSMENT AND PLAN  81 year old female with known CAD, s/p CABG in 2010, CKD stage IV, admitted with acute on chronic systolic CHF  1. Acute on chronic systolic HF with ICM (EF 29-52% by cath 07/2014)  - I/O -4.2L since admission. Weight not accurate. Cr 2.17 yesterday  - transitioned to 60mg  Torsemide in AM and 40mg  torsemide in PM.   - O2 dropped to 87% on 9/17 with ambulation, need home O2 on discharge, tried O2 before, could not carry around a big tank, will check with case manager regarding smaller O2 condenser.  - likely stable for discharge once seen by PT today. High risk of fluid reaccumulation, 6 admission in last 6 month, will arrange 7 day TOC followup along with BMET   2. CAD s/p CABG in 09/2008 (LIMA to LAD and free RIMA Y graft to OM off of LIMA with aortic root replacement)  - last cath 08/06/2014 patent DES to prox RCA, 70% prox to mid LCx, 50% prox to mid LAD, free RIMA arise from mid LIMA graft widely patent, 90% ost OM1, 75% D1, EF 15-20%. Medical therapy  3. severe MR  - per cath report 08/06/2014 contemplate mitral clip but with significant deterioration in LV function, impact on symptom may be minimal.    4. HTN 5. HLD 6. CKD  IV 2/2 solitary kidney from birth 28. PAF on amiodarone  - not on anticoagulation because of her high risk of bleeding  Almyra Deforest 10/01/2014  I have seen and examined the patient along with Almyra Deforest, PA.  I have reviewed the chart, notes and new data.  I agree with PA's note.  Key new complaints: feels weak, not dyspneic Key examination changes: no edema or JVD or rales, but +S3 and loud holosystolic apical murmur of MR Key new findings / data: slight improvement in creatinine  PLAN: If walks ok with PT and we can arrange home O2, DC home today with early f/u. Continue home PT. Needs very tight "dry weight" monitoring and diuretic adjustment. Her favorite food is Pizza from [employer], likely salt rich.  Sanda Klein, MD, [place] [phone] 10/01/2014, 10:20 AM

## 2014-10-01 NOTE — Care Management Important Message (Signed)
Important Message  Patient Details  Name: [name] MRN: [id number] Date of Birth: 1928-01-07   Medicare Important Message Given:  Yes-second notification given    Nathen May 10/01/2014, 4:43 PM

## 2014-10-01 NOTE — Care Management Note (Signed)
Case Management Note  Patient Details  Name: NONA GRACEY MRN: [id number] Date of Birth: 02/24/1927  Subjective/Objective:  Pt admitted on 09/25/2014 with acute systolic CHF exacerbation. Pt is from home alone with support of daughter.      Action/Plan: Pt has [employer] at home for DME. Per pt she had 02 up to one month ago. She was using [facility] for DME 02 needs and they picked the tank up. Pt will need to be re-qualified for home 02 . CM will continue to monitor.                Action/Plan:   Expected Discharge Date:                  Expected Discharge Plan:  [place]  In-House Referral:  NA  Discharge planning Services  CM Consult  Post Acute Care Choice:  Durable Medical Equipment Choice offered to:  Patient  DME Arranged:  Oxygen DME Agency:  [place] Arranged:  RN, PT [facility] Agency:  [place]  Status of Service:  Completed, signed off  Medicare Important Message Given:    Date Medicare IM Given:    Medicare IM give by:    Date Additional Medicare IM Given:    Additional Medicare Important Message give by:     If discussed at [place] of Stay Meetings, dates discussed:    Additional Comments: [id number] 10-01-14 Jacqlyn Krauss, RN,BSN [phone] CM did speak with pt again in reference to disposition needs. Plan is for home with [facility] services. Agreeable to [facility]. DME to be provided to pt before d/c. No further needs from CM at this time.   Bethena Roys, RN 10/01/2014, 2:46 PM

## 2014-10-01 NOTE — Progress Notes (Signed)
10/01/2014 SATURATION QUALIFICATIONS: (This note is used to comply with regulatory documentation for home oxygen)  Patient Saturations on Room Air at Rest = 91%  Patient Saturations on Room Air while Ambulating = 87%  Patient Saturations on 1 Liters of oxygen while Ambulating = 92%  Please briefly explain why patient needs home oxygen: desaturates on RA during mobility.  Barbarann Ehlers [place], [place], DPT [phone]

## 2014-10-01 NOTE — Discharge Instructions (Signed)
Avoid salty diet, limit daily fluid intake to <2 Liter per day Measure your weight every morning and call cardiology if weight increase by more than 3 lbs overnight or 5 lbs in a single day

## 2014-10-01 NOTE — Evaluation (Signed)
Physical Therapy Evaluation Patient Details Name: [name] MRN: [id number] DOB: [birth year] Today's Date: 10/01/2014   History of Present Illness  81 y.o. female admitted to [facility] on 09/25/14 with SOB.  Dx with acute on chronic systolic HF.      Pt with significant PMHx of HTN, COPD, arterial disease, mitral regurgitation, CKD, anemia, hypertensive renal disease, LVH, aorta aneurysm, PAF, s/p CABG and multiple cardiac catheterizations.    Clinical Impression  Pt is weak and unsteady on her feet. She normally does not require an assistive device to walk, but I am now recommending that she use a RW full time at home until she gets stronger.  O2sats dropped during gait.  She will likely need home O2.   PT to follow acutely for deficits listed below.       Follow Up Recommendations Home health PT    Equipment Recommendations  Other (comment) (home O2)    Recommendations for Other Services   NA     Precautions / Restrictions Precautions Precautions: Other (comment) Precaution Comments: monitor O2 during mobility Restrictions Weight Bearing Restrictions: No      Mobility  Bed Mobility Overal bed mobility: Modified Independent             General bed mobility comments: HOB elevated, pt pulling on railing to get to sitting.   Transfers Overall transfer level: Needs assistance Equipment used: Rolling walker (2 wheeled) Transfers: Sit to/from Stand Sit to Stand: Supervision         General transfer comment: close supervision to get to standing. Pt made multiple attempts and eventually was able to get up. Verbal cues for safe hand placement as pt was pulling on RW to attempt to get to standing.   Ambulation/Gait Ambulation/Gait assistance: Min guard Ambulation Distance (Feet): 150 Feet Assistive device: Rolling walker (2 wheeled) Gait Pattern/deviations: Step-through pattern;Shuffle Gait velocity: decreased Gait velocity interpretation: Below normal speed for  age/gender General Gait Details: slow, shuffling gait, did walk a short distance in the room without an assistive devicce and pt was reaching with both hands for support on furniture.  I do recommend she use a RW full time until she gets stronger.  Pt agrees.          Balance Overall balance assessment: Needs assistance Sitting-balance support: Feet supported;No upper extremity supported Sitting balance-Leahy Scale: Good     Standing balance support: Bilateral upper extremity supported Standing balance-Leahy Scale: Poor                               Pertinent Vitals/Pain Pain Assessment: No/denies pain    Home Living Family/patient expects to be discharged to:: Private residence Living Arrangements: Alone Available Help at Discharge: Family;Available PRN/intermittently Type of Home: Mobile home Home Access: Stairs to enter Entrance Stairs-Rails: Right Entrance Stairs-Number of Steps: 2 Home Layout: One level Home Equipment: Cane - single point;Walker - 2 wheels;Wheelchair - manual      Prior Function Level of Independence: Independent                  Extremity/Trunk Assessment   Upper Extremity Assessment: Generalized weakness           Lower Extremity Assessment: Generalized weakness      Cervical / Trunk Assessment: Kyphotic  Communication   Communication: No difficulties  Cognition Arousal/Alertness: Awake/alert Behavior During Therapy: WFL for tasks assessed/performed Overall Cognitive Status: Within Functional Limits for tasks assessed  General Comments General comments (skin integrity, edema, etc.): See separate note for O2 sats during gait.           Assessment/Plan    PT Assessment Patient needs continued PT services  PT Diagnosis Difficulty walking;Abnormality of gait;Generalized weakness   PT Problem List Decreased strength;Decreased activity tolerance;Decreased mobility;Decreased  balance;Decreased knowledge of use of DME;Cardiopulmonary status limiting activity  PT Treatment Interventions DME instruction;Stair training;Gait training;Functional mobility training;Therapeutic activities;Therapeutic exercise;Balance training;Neuromuscular re-education;Patient/family education   PT Goals (Current goals can be found in the Care Plan section) Acute Rehab PT Goals Patient Stated Goal: to go home and get to feeling stronger PT Goal Formulation: With patient Time For Goal Achievement: 10/15/14 Potential to Achieve Goals: Good    Frequency Min 3X/week    End of Session   Activity Tolerance: Patient limited by fatigue Patient left: in chair;with call bell/phone within reach           Time: 1154-1221 PT Time Calculation (min) (ACUTE ONLY): 27 min   Charges:   PT Evaluation $Initial PT Evaluation Tier I: 1 Procedure PT Treatments $Gait Training: 8-22 mins        Rebecca B. Keachi, [place], DPT [phone]   10/01/2014, 12:33 PM

## 2014-10-01 NOTE — Care Management Important Message (Signed)
Important Message  Patient Details  Name: [name] MRN: [id number] Date of Birth: 29-Sep-1927   Medicare Important Message Given:  Yes-second notification given    Nathen May 10/01/2014, 4:44 PM

## 2014-10-01 NOTE — Discharge Summary (Signed)
Discharge Summary   Patient ID: [name],  MRN: [id number], DOB/AGE: September 21, 1927 81 y.o.  Admit date: 09/25/2014 Discharge date: 10/01/2014  Primary Care Provider: Leonard Downing Primary Cardiologist: Dr. Aundra Dubin  Discharge Diagnoses Principal Problem:   Acute on chronic systolic heart failure Active Problems:   Hyperlipidemia   Severe mitral regurgitation   Essential hypertension   CAD-native   COPD (chronic obstructive pulmonary disease)   CKD (chronic kidney disease), stage IV   Ischemic cardiomyopathy   Paroxysmal atrial fibrillation   Allergies No Known Allergies  Hospital Course  Mrs. 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 She has had multiple admissions in the last 6 months for acute heart failure. Echocardiogram obtained on 08/04/2014 showed EF 30%, diffuse hypokinesis, mild yes, mild AI, severe MR, severe left atrial dilatation. Her last cardiac cath on 08/06/2014 showed patent DES to proximal RCA, 70% prox to mid LCx, 50% prox to mid LAD, free RIMA arise from mid LIMA graft widely patent, 90% ost OM1, 75% D1, EF 15-20%. Medical therapy was recommended. Given chronic anemia and anemia for DAPT, she was not placed on systemic anticoagulation therapy.  Patient presented to [facility] on 09/25/2014 with 2-3 days of worsening shortness breath. She initially was placed on 40 mg twice a day IV Lasix, however she did not put out much urine. Her Lasix was increased to 40 mg q6HR which improved her diuresis significantly. She was eventually transitioned to 60 mg torsemide in AM and 40 mg torsemide in PM. She did ambulate with physical  therapy, her O2 saturation dropped down to 87% on room air with ambulation. It was 91% on room air at rest. She qualifies for home oxygen. She was seen on 10/01/2014, at which time she has diuresed more than 4 L, her weight is not accurate, roughly around 120 lbs on discharge. Her creatinine has improved from 2.9 on arrival to 2.17 with diuresis. She appears to be euvolemic on exam and is stable for discharge from cardiology perspective. Given her recurrent admission with heart failure and untreated severe MR, she is at higher risk for readmission. I will arrange 7 day follow-up in our heart failure clinic.   Discharge Vitals Blood pressure 111/55, pulse 63, temperature 97.9 F (36.6 C), temperature source Oral, resp. rate 20, height 5\' 8"  (1.727 m), weight 120 lb 11.2 oz (54.749 kg), SpO2 98 %.  Filed Weights   09/28/14 0432 09/29/14 0451 09/30/14 0412  Weight: 124 lb 12.8 oz (56.609 kg) 112 lb 10.5 oz (51.1 kg) 120 lb 11.2 oz (54.749 kg)    Labs  Basic Metabolic Panel  Recent Labs  09/29/14 0830 09/30/14 0340  NA 136 138  K 3.7 3.6  CL 98* 100*  CO2 29 28  GLUCOSE 164* 92  BUN 40* 43*  CREATININE 2.46* 2.17*  CALCIUM 8.5* 8.6*    Disposition  Pt is being discharged home today in good condition.  Follow-up Plans & Appointments      Follow-up Information    Follow up with [facility].   Why:  Oxygen    Contact information:   [place]  [place] [address] [phone]       Follow up with [facility].   Why:  Physical Therapy and Registered Nurse.    Contact information:   [address] [place] [address] [phone]       Follow up with [facility] On 10/08/2014.   Specialty:  Cardiology   Why:  2:40pm. [place] code [phone].    Contact information:   [address] [id number] [place] [phone]      Discharge  Medications    Medication List    TAKE these medications        acetaminophen 325 MG tablet  Commonly known as:  TYLENOL  Take 650 mg by mouth daily as needed (pain).     albuterol 108 (90 BASE) MCG/ACT inhaler  Commonly known as:  PROVENTIL HFA;VENTOLIN HFA  Inhale 2 puffs into the lungs every 4 (four) hours as needed for wheezing or shortness of breath.     amiodarone 200 MG tablet  Commonly known as:  PACERONE  Take 1 tablet (200 mg total) by mouth daily.     aspirin EC 81 MG tablet  Take 81 mg by mouth daily.     carvedilol 3.125 MG tablet  Commonly known as:  COREG  Take 1 tablet (3.125 mg total) by mouth 2 (two) times daily with a meal.     clopidogrel 75 MG tablet  Commonly known as:  PLAVIX  Take 1 tablet (75 mg total) by mouth daily with breakfast.     Coenzyme Q-10 100 MG capsule  Take 100 mg by mouth daily at 2 PM daily at 2 PM.     Melatonin 5 MG Tabs  Take 5 mg by mouth at bedtime as needed (sleep).     nitroGLYCERIN 0.4 MG SL tablet  Commonly known as:  NITROSTAT  Place 1 tablet (0.4 mg total) under the tongue every 5 (five) minutes as needed.     potassium chloride 10 MEQ tablet  Commonly known as:  K-DUR,KLOR-CON  Take 2 tablets (20 mEq total) by mouth daily.     pravastatin 80 MG tablet  Commonly known as:  PRAVACHOL  Take 80 mg by mouth daily.     torsemide 20 MG tablet  Commonly known as:  DEMADEX  60mg  (3 tablets) in the morning, 40mg  (2 tablets) in the afternoon        Outstanding Labs/Studies  Obtain BMET on followup  Duration of Discharge Encounter   Greater than 30 minutes including physician time.  Hilbert Corrigan PA-C Pager: [phone] 10/01/2014, 3:24 PM

## 2014-10-08 ENCOUNTER — Ambulatory Visit (HOSPITAL_BASED_OUTPATIENT_CLINIC_OR_DEPARTMENT_OTHER)
Admit: 2014-10-08 | Discharge: 2014-10-08 | Disposition: A | Discharge status: Home / Self Care | Payer: Medicare Other | Source: Home / Self Care | Attending: Internal Medicine | Admitting: Internal Medicine

## 2014-10-08 ENCOUNTER — Encounter (HOSPITAL_COMMUNITY): Payer: Self-pay

## 2014-10-08 VITALS — BP 104/58 | HR 65 | Wt 123.5 lb

## 2014-10-08 DIAGNOSIS — Z7902 Long term (current) use of antithrombotics/antiplatelets: Secondary | ICD-10-CM | POA: Insufficient documentation

## 2014-10-08 DIAGNOSIS — N184 Chronic kidney disease, stage 4 (severe): Secondary | ICD-10-CM

## 2014-10-08 DIAGNOSIS — E785 Hyperlipidemia, unspecified: Secondary | ICD-10-CM

## 2014-10-08 DIAGNOSIS — I5022 Chronic systolic (congestive) heart failure: Secondary | ICD-10-CM

## 2014-10-08 DIAGNOSIS — Z8249 Family history of ischemic heart disease and other diseases of the circulatory system: Secondary | ICD-10-CM

## 2014-10-08 DIAGNOSIS — I130 Hypertensive heart and chronic kidney disease with heart failure and stage 1 through stage 4 chronic kidney disease, or unspecified chronic kidney disease: Secondary | ICD-10-CM | POA: Diagnosis not present

## 2014-10-08 DIAGNOSIS — I080 Rheumatic disorders of both mitral and aortic valves: Secondary | ICD-10-CM | POA: Diagnosis not present

## 2014-10-08 DIAGNOSIS — Z7982 Long term (current) use of aspirin: Secondary | ICD-10-CM

## 2014-10-08 DIAGNOSIS — I447 Left bundle-branch block, unspecified: Secondary | ICD-10-CM | POA: Insufficient documentation

## 2014-10-08 DIAGNOSIS — J449 Chronic obstructive pulmonary disease, unspecified: Secondary | ICD-10-CM | POA: Insufficient documentation

## 2014-10-08 DIAGNOSIS — I252 Old myocardial infarction: Secondary | ICD-10-CM | POA: Insufficient documentation

## 2014-10-08 DIAGNOSIS — Z87891 Personal history of nicotine dependence: Secondary | ICD-10-CM | POA: Insufficient documentation

## 2014-10-08 DIAGNOSIS — I340 Nonrheumatic mitral (valve) insufficiency: Secondary | ICD-10-CM | POA: Insufficient documentation

## 2014-10-08 DIAGNOSIS — Z951 Presence of aortocoronary bypass graft: Secondary | ICD-10-CM

## 2014-10-08 DIAGNOSIS — I509 Heart failure, unspecified: Secondary | ICD-10-CM | POA: Diagnosis not present

## 2014-10-08 DIAGNOSIS — I739 Peripheral vascular disease, unspecified: Secondary | ICD-10-CM | POA: Insufficient documentation

## 2014-10-08 DIAGNOSIS — I255 Ischemic cardiomyopathy: Secondary | ICD-10-CM

## 2014-10-08 DIAGNOSIS — I129 Hypertensive chronic kidney disease with stage 1 through stage 4 chronic kidney disease, or unspecified chronic kidney disease: Secondary | ICD-10-CM

## 2014-10-08 DIAGNOSIS — I480 Paroxysmal atrial fibrillation: Secondary | ICD-10-CM

## 2014-10-08 DIAGNOSIS — I2510 Atherosclerotic heart disease of native coronary artery without angina pectoris: Secondary | ICD-10-CM

## 2014-10-08 LAB — COMPREHENSIVE METABOLIC PANEL
ALBUMIN: 3.1 g/dL — AB (ref 3.5–5.0)
ALT: 15 U/L (ref 14–54)
AST: 25 U/L (ref 15–41)
Alkaline Phosphatase: 41 U/L (ref 38–126)
Anion gap: 11 (ref 5–15)
BUN: 46 mg/dL — AB (ref 6–20)
CHLORIDE: 96 mmol/L — AB (ref 101–111)
CO2: 30 mmol/L (ref 22–32)
CREATININE: 2.77 mg/dL — AB (ref 0.44–1.00)
Calcium: 9 mg/dL (ref 8.9–10.3)
GFR calc Af Amer: 17 mL/min — ABNORMAL LOW (ref >60)
GFR, EST NON AFRICAN AMERICAN: 15 mL/min — AB (ref >60)
GLUCOSE: 105 mg/dL — AB (ref 65–99)
POTASSIUM: 4.2 mmol/L (ref 3.5–5.1)
Sodium: 137 mmol/L (ref 135–145)
Total Bilirubin: 0.8 mg/dL (ref 0.3–1.2)
Total Protein: 6.1 g/dL — ABNORMAL LOW (ref 6.5–8.1)

## 2014-10-08 LAB — TSH: TSH: 1.19 u[IU]/mL (ref 0.350–4.500)

## 2014-10-08 LAB — BRAIN NATRIURETIC PEPTIDE: B NATRIURETIC PEPTIDE 5: 3708.7 pg/mL — AB (ref 0.0–100.0)

## 2014-10-08 MED ORDER — POTASSIUM CHLORIDE CRYS ER 10 MEQ PO TBCR: 40.0000 meq | EXTENDED_RELEASE_TABLET | Freq: Every day | ORAL | Status: DC

## 2014-10-08 MED ORDER — TORSEMIDE 20 MG PO TABS
ORAL_TABLET | ORAL | Status: DC
Start: 2014-10-08 — End: 2014-10-16

## 2014-10-08 NOTE — Patient Instructions (Addendum)
INCREASE Torsemide to 80 mg (4 tabs) in the AM and 60 mg (3 tabs) in the PM for 4 days, then restart Torsemide 80 mg (4 tabs ) in the AM and 40 mg (2 tabs) in the PM INCREASE Potassium to 40 MeQ, 4 tabs daily  Labs today  Your physician recommends that you schedule a follow-up appointment in: 1 week with Dr.McLean   Do the following things EVERYDAY: 1. Weigh yourself in the morning before breakfast. Write it down and keep it in a log. 2. Take your medicines as prescribed 3. Eat low salt foods-Limit salt (sodium) to 2000 mg per day.  4. Stay as active as you can everyday 5. Limit all fluids for the day to less than 2 liters

## 2014-10-08 NOTE — Progress Notes (Signed)
Patient ID: [name], female   DOB: 1927/08/19, 81 y.o.   MRN: [id number]     Advanced Heart Failure Note Date:  10/08/2014  Patient ID:  [name], DOB 04/27/1927, MRN [id number] PCP:  Leonard Downing, MD  Cardiologist: Dr. Angelena Form HF Cardiologist: Dr Aundra Dubin  Reason for referral: Systolic CHF with recent admission and worsening EF.  History of Present Illness:  [name] is a 81 y.o. 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   In 5/16, she had PCI with DES to [facility].   She was admitted for acute on chronic systolic CHF and chest pain 7/22-08/15/14. She has had multiple admissions for acute HF over the last several months.  Echo 08/04/14: EF 30%, diffuse HK, mild AS, mild AI, severe MR, severe LAE, mildly dilated RV/mildly reduced RV systolic function, mild RAE, mild-mod TR, PASP 35. She was initially diuresed with IV Lasix. She had a minimal bump in troponin to 0.04.   Cath showed severe MR and EF of 15-20%. Widely patent RCA stent, widely patent LIMA to LAD and man made Y graft to obtuse marginal #2.  Cath also showed severe OM1 stenosis with the risk of intervention outweighing the benefit. TEE was considered to further assess her MR, but was cancelled 2/2 new onset afib with RVR. She was started on amiodarone and converted to NSR. Given chronic anemia and need for DAPT with recent stents in 05/2014, she was not felt to be a candidate for anticoagulation at this time.  She remains on ASA 81 and Plavix. Post-cath she was noted to have AKI likely related to hypotension and CIN. Nephrology consulted and held antihypertensives and  hydrated the patient.  D/c weight 129lb.   After last appointment, she was admitted with acute on chronic systolic CHF in 7/67 and was diuresed. She was sent hom with oxygen, which she is using at night. She is currently short of breath walking around her house (this is chronic). No chest pain.  No lightheadedness.  2-pillow orthopnea, no PND.  Feet feel numb (long-term), no ulcers.  No tachypalpitations.  Weight is down 1 lb compared to prior appointment.   ECG (9/16): NSR, LBBB  Labs (7/16): HCT 29 Labs (8/16): K 4.6, creatinine 2.54 Labs (9/16): K 3.6, creatinine 2.17  PMH: 1. CAD:  - Inferior STEMI 7/10 with RCA BMS.  - 9/10 CABG x 2 with LIMA-LAD and RIMA-OM as Y graft from LIMA.   - 5/16 DES to proximal RCA.  - LHC (7/16) with EF 15-20%, severe MR, RCA stent patent, LIMA-LAD and Y graft to OM2 patent.  70% mLCx, 90% ostial OM1, 70% D1.  OM1 was only potential target, medically managed given no angina.  2. Chronic systolic CHF: Ischemic cardiomyopathy.  Echo (7/16) with EF 30%, diffuse hypokinesis, mild aortic stenosis, mild AI, severe MR, mildly dilated RV with mildly decreased systolic function, mild-moderate TR.   3. Mitral regurgitation: Severe, likely post-infarct MR.  4. Atrial fibrillation: Paroxysmal.  On amiodarone, not anticoagulated.  5. CKD: Has 1 functional kidney. 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  6. PAD: Bilateral SFA disease.  7. Anemia 8. LBBB 9. Mediastinal lymphadenopathy  10. Carotid stenosis: Carotid dopplers (4/16) with 60-79% bilateral ICA stenosis.  11. COPD: Currently not using oxygen.  12. HTN 13. Hyperlipidemia 14. Aortic stenosis: Mild on 7/16 echo.   Current Outpatient Prescriptions  Medication Sig Dispense Refill  . acetaminophen (TYLENOL) 325 MG tablet Take 650 mg by mouth daily as needed (pain).    Marland Kitchen albuterol (PROVENTIL HFA;VENTOLIN HFA) 108 (90 BASE) MCG/ACT inhaler Inhale 2 puffs into the lungs every 4 (four) hours as needed for wheezing or shortness of breath.     Marland Kitchen  aspirin EC 81 MG tablet Take 81 mg by mouth daily.    . carvedilol (COREG) 3.125 MG tablet Take 1 tablet (3.125 mg total) by mouth 2 (two) times daily with a meal. 60 tablet 1  . clopidogrel (PLAVIX) 75 MG tablet Take 1 tablet (75 mg total) by mouth daily with breakfast. 30 tablet 6  . Coenzyme Q-10 100 MG capsule Take 100 mg by mouth daily at 2 PM daily at 2 PM.     . Melatonin 5 MG TABS Take 5 mg by mouth at bedtime as needed (sleep).    . nitroGLYCERIN (NITROSTAT) 0.4 MG SL tablet Place 1 tablet (0.4 mg total) under the tongue every 5 (five) minutes as needed. 25 tablet 6  . potassium chloride (K-DUR,KLOR-CON) 10 MEQ tablet Take 4 tablets (40 mEq total) by mouth daily. 120 tablet 3  . pravastatin (PRAVACHOL) 80 MG tablet Take 80 mg by mouth daily.    Marland Kitchen torsemide (DEMADEX) 20 MG tablet Take 4 tabs (80 mg) in the AM and take 2 tabs (40 mg) in the PM 150 tablet 3  . amiodarone (PACERONE) 200 MG tablet Take 1 tablet (200 mg total) by mouth daily. 30 tablet 1   No current facility-administered medications for this encounter.    Allergies:   Review of patient's allergies indicates no known allergies.   Social History:  The patient  reports that she quit smoking about 6 years ago. Her smoking use included Cigarettes. She has a 60 pack-year smoking history. She has never used smokeless tobacco. She reports that she does not drink alcohol or use illicit drugs.   Family History:  The patient's family history includes Coronary artery disease in her brother and mother; Heart attack in her sister; Heart disease in her mother; Heart failure in her mother; Kidney cancer in her brother; Leukemia in her father, sister, and sister. There is no history of Colon cancer, Pancreatic cancer, Stomach cancer, Rectal cancer, Stroke, or Hypertension.  ROS:  Please see the history of present illness.    All other systems are reviewed and otherwise negative.   PHYSICAL EXAM:  VS:  BP 104/58 mmHg  Pulse 65  Wt 123 lb  8 oz (56.019 kg)  SpO2 97%  BMI: Body mass index is 18.78 kg/(m^2).   General. Well nourished, well developed thin WF, NAD HEENT: normocephalic, atraumatic Neck: JVD 14 cm, carotid bruits or masses Cardiac:  normal S1, S2; RRR; 3/6 SEM at apex Lungs:  CTA bilaterally. Abd: soft, nontender, no hepatomegaly, + BS MS: no deformity or atrophy Ext: No cyanosis, clubbing. 2+ edema 1/2 to knees bilaterally Skin: warm and dry, no rash. Lots of bruising Neuro:  moves all extremities spontaneously, no focal abnormalities noted, follows commands Psych: Normal affect  Wt Readings from Last 3 Encounters:  10/08/14 123 lb 8 oz (56.019 kg)  09/30/14 120 lb 11.2 oz (54.749 kg)  09/24/14 124 lb 1.9 oz (56.3 kg)  ASSESSMENT AND PLAN: 1. Chronic systolic CHF: Ischemic cardiomyopathy, EF 30% with diffuse hypokinesis. On exam today, she remains quite volume overloaded with NYHA class IIIb symptoms despite recent hospitalization.  She is dyspneic walking around the house.   - Increase torsemide to 80 qam/60 qpm for 4 days, then 80 qam/40 qpm after that.  Increase KCl to 40 daily.  BMET/BNP today.   - Continue Coreg at low dose.  - No ACEI or spironolactone for now with elevated creatinine. No BP room for hydralazine/Imdur.  Elevated creatinine makes digoxin a difficult medication to use in this small woman.  - She has LBBB but given age, would lean against CRT-D device.  Could consider CRT-P, but she prefers to avoid any invasive procedure, which I think is reasonable.  2. CAD: s/p CABG.  DES to native RCA in 5/16.  She is on ASA 81 and Plavix, which she will continue for at least a year.  At that time, can consider stopping Plavix and starting anticoagulation given PAF. Continue statin.  3. Atrial fibrillation: Paroxysmal, on amiodarone.  NSR today.  She was deemed to be a poor candidate for triple anticoagulant therapy. Will need to follow LFTs and TSH with amiodarone, check today.  She will need regular  eye exams while on amiodarone.  4. Mitral regurgitation: Severe by echo.  Suspect infarct-related MR.  She is not a candidate for open MV repair/replacement.  Mitral valve clip could be an option but she prefers to avoid invasive management at this point, which I think is reasonable.  5. PAD: Unable to palpate pedal pulses.  Feet described as numb and cold.  Would hold off on doppler evaluation as she would not want invasive treatment. 6. CKD: Will need to follow BMET closely.  Has a nephrologist.   Followup in 1 week.   Loralie Champagne 10/08/2014

## 2014-10-11 ENCOUNTER — Inpatient Hospital Stay (HOSPITAL_COMMUNITY)
Admission: EM | Admit: 2014-10-11 | Discharge: 2014-10-16 | DRG: 291 | Disposition: A | Discharge status: Hospice | Payer: Medicare Other | Attending: Cardiology | Admitting: Cardiology

## 2014-10-11 ENCOUNTER — Encounter (HOSPITAL_COMMUNITY): Payer: Self-pay | Admitting: Emergency Medicine

## 2014-10-11 ENCOUNTER — Emergency Department (HOSPITAL_COMMUNITY): Payer: Medicare Other

## 2014-10-11 DIAGNOSIS — I340 Nonrheumatic mitral (valve) insufficiency: Secondary | ICD-10-CM | POA: Diagnosis present

## 2014-10-11 DIAGNOSIS — Z7902 Long term (current) use of antithrombotics/antiplatelets: Secondary | ICD-10-CM

## 2014-10-11 DIAGNOSIS — E872 Acidosis, unspecified: Secondary | ICD-10-CM

## 2014-10-11 DIAGNOSIS — I6523 Occlusion and stenosis of bilateral carotid arteries: Secondary | ICD-10-CM | POA: Diagnosis present

## 2014-10-11 DIAGNOSIS — K449 Diaphragmatic hernia without obstruction or gangrene: Secondary | ICD-10-CM | POA: Diagnosis present

## 2014-10-11 DIAGNOSIS — I480 Paroxysmal atrial fibrillation: Secondary | ICD-10-CM | POA: Diagnosis present

## 2014-10-11 DIAGNOSIS — I442 Atrioventricular block, complete: Secondary | ICD-10-CM | POA: Diagnosis present

## 2014-10-11 DIAGNOSIS — I700 Atherosclerosis of aorta: Secondary | ICD-10-CM | POA: Diagnosis present

## 2014-10-11 DIAGNOSIS — Z66 Do not resuscitate: Secondary | ICD-10-CM | POA: Diagnosis present

## 2014-10-11 DIAGNOSIS — Z8249 Family history of ischemic heart disease and other diseases of the circulatory system: Secondary | ICD-10-CM

## 2014-10-11 DIAGNOSIS — I679 Cerebrovascular disease, unspecified: Secondary | ICD-10-CM | POA: Diagnosis present

## 2014-10-11 DIAGNOSIS — N184 Chronic kidney disease, stage 4 (severe): Secondary | ICD-10-CM | POA: Diagnosis present

## 2014-10-11 DIAGNOSIS — I5023 Acute on chronic systolic (congestive) heart failure: Secondary | ICD-10-CM | POA: Diagnosis present

## 2014-10-11 DIAGNOSIS — Z86010 Personal history of colonic polyps: Secondary | ICD-10-CM | POA: Diagnosis not present

## 2014-10-11 DIAGNOSIS — Z955 Presence of coronary angioplasty implant and graft: Secondary | ICD-10-CM

## 2014-10-11 DIAGNOSIS — Z79899 Other long term (current) drug therapy: Secondary | ICD-10-CM

## 2014-10-11 DIAGNOSIS — Z87891 Personal history of nicotine dependence: Secondary | ICD-10-CM

## 2014-10-11 DIAGNOSIS — Q600 Renal agenesis, unilateral: Secondary | ICD-10-CM | POA: Diagnosis not present

## 2014-10-11 DIAGNOSIS — J9610 Chronic respiratory failure, unspecified whether with hypoxia or hypercapnia: Secondary | ICD-10-CM | POA: Diagnosis present

## 2014-10-11 DIAGNOSIS — J449 Chronic obstructive pulmonary disease, unspecified: Secondary | ICD-10-CM | POA: Diagnosis present

## 2014-10-11 DIAGNOSIS — I719 Aortic aneurysm of unspecified site, without rupture: Secondary | ICD-10-CM | POA: Diagnosis present

## 2014-10-11 DIAGNOSIS — E875 Hyperkalemia: Secondary | ICD-10-CM

## 2014-10-11 DIAGNOSIS — Z951 Presence of aortocoronary bypass graft: Secondary | ICD-10-CM | POA: Diagnosis not present

## 2014-10-11 DIAGNOSIS — I130 Hypertensive heart and chronic kidney disease with heart failure and stage 1 through stage 4 chronic kidney disease, or unspecified chronic kidney disease: Principal | ICD-10-CM | POA: Diagnosis present

## 2014-10-11 DIAGNOSIS — I739 Peripheral vascular disease, unspecified: Secondary | ICD-10-CM | POA: Diagnosis present

## 2014-10-11 DIAGNOSIS — Z806 Family history of leukemia: Secondary | ICD-10-CM | POA: Diagnosis not present

## 2014-10-11 DIAGNOSIS — D649 Anemia, unspecified: Secondary | ICD-10-CM | POA: Diagnosis present

## 2014-10-11 DIAGNOSIS — E43 Unspecified severe protein-calorie malnutrition: Secondary | ICD-10-CM | POA: Insufficient documentation

## 2014-10-11 DIAGNOSIS — I255 Ischemic cardiomyopathy: Secondary | ICD-10-CM | POA: Diagnosis present

## 2014-10-11 DIAGNOSIS — Z515 Encounter for palliative care: Secondary | ICD-10-CM | POA: Diagnosis not present

## 2014-10-11 DIAGNOSIS — I5042 Chronic combined systolic (congestive) and diastolic (congestive) heart failure: Secondary | ICD-10-CM | POA: Diagnosis not present

## 2014-10-11 DIAGNOSIS — E876 Hypokalemia: Secondary | ICD-10-CM | POA: Diagnosis present

## 2014-10-11 DIAGNOSIS — I252 Old myocardial infarction: Secondary | ICD-10-CM

## 2014-10-11 DIAGNOSIS — I350 Nonrheumatic aortic (valve) stenosis: Secondary | ICD-10-CM | POA: Diagnosis present

## 2014-10-11 DIAGNOSIS — Z9981 Dependence on supplemental oxygen: Secondary | ICD-10-CM | POA: Diagnosis not present

## 2014-10-11 DIAGNOSIS — R001 Bradycardia, unspecified: Secondary | ICD-10-CM | POA: Diagnosis present

## 2014-10-11 DIAGNOSIS — Z7982 Long term (current) use of aspirin: Secondary | ICD-10-CM | POA: Diagnosis not present

## 2014-10-11 DIAGNOSIS — N179 Acute kidney failure, unspecified: Secondary | ICD-10-CM

## 2014-10-11 DIAGNOSIS — E785 Hyperlipidemia, unspecified: Secondary | ICD-10-CM | POA: Diagnosis present

## 2014-10-11 DIAGNOSIS — I2510 Atherosclerotic heart disease of native coronary artery without angina pectoris: Secondary | ICD-10-CM | POA: Diagnosis present

## 2014-10-11 DIAGNOSIS — N189 Chronic kidney disease, unspecified: Secondary | ICD-10-CM | POA: Diagnosis not present

## 2014-10-11 DIAGNOSIS — I447 Left bundle-branch block, unspecified: Secondary | ICD-10-CM | POA: Diagnosis present

## 2014-10-11 LAB — CBC
HEMATOCRIT: 34 % — AB (ref 36.0–46.0)
Hemoglobin: 9.4 g/dL — ABNORMAL LOW (ref 12.0–15.0)
MCH: 22.2 pg — AB (ref 26.0–34.0)
MCHC: 27.6 g/dL — AB (ref 30.0–36.0)
MCV: 80.2 fL (ref 78.0–100.0)
Platelets: 491 10*3/uL — ABNORMAL HIGH (ref 150–400)
RBC: 4.24 MIL/uL (ref 3.87–5.11)
RDW: 20.8 % — AB (ref 11.5–15.5)
WBC: 8.8 10*3/uL (ref 4.0–10.5)

## 2014-10-11 LAB — I-STAT CG4 LACTIC ACID, ED: Lactic Acid, Venous: 6.18 mmol/L (ref 0.5–2.0)

## 2014-10-11 LAB — BASIC METABOLIC PANEL
ANION GAP: 12 (ref 5–15)
Anion gap: 13 (ref 5–15)
BUN: 48 mg/dL — AB (ref 6–20)
BUN: 46 mg/dL — ABNORMAL HIGH (ref 6–20)
CALCIUM: 10 mg/dL (ref 8.9–10.3)
CHLORIDE: 99 mmol/L — AB (ref 101–111)
CO2: 22 mmol/L (ref 22–32)
CO2: 23 mmol/L (ref 22–32)
Calcium: 8.6 mg/dL — ABNORMAL LOW (ref 8.9–10.3)
Chloride: 96 mmol/L — ABNORMAL LOW (ref 101–111)
Creatinine, Ser: 3.12 mg/dL — ABNORMAL HIGH (ref 0.44–1.00)
Creatinine, Ser: 3.23 mg/dL — ABNORMAL HIGH (ref 0.44–1.00)
GFR calc Af Amer: 15 mL/min — ABNORMAL LOW (ref >60)
GFR calc non Af Amer: 12 mL/min — ABNORMAL LOW (ref >60)
GFR, EST AFRICAN AMERICAN: 14 mL/min — AB (ref >60)
GFR, EST NON AFRICAN AMERICAN: 13 mL/min — AB (ref >60)
GLUCOSE: 230 mg/dL — AB (ref 65–99)
Glucose, Bld: 278 mg/dL — ABNORMAL HIGH (ref 65–99)
POTASSIUM: 6.5 mmol/L — AB (ref 3.5–5.1)
POTASSIUM: 6.4 mmol/L — AB (ref 3.5–5.1)
Sodium: 131 mmol/L — ABNORMAL LOW (ref 135–145)
Sodium: 134 mmol/L — ABNORMAL LOW (ref 135–145)

## 2014-10-11 LAB — I-STAT TROPONIN, ED: TROPONIN I, POC: 0.02 ng/mL (ref 0.00–0.08)

## 2014-10-11 LAB — CBG MONITORING, ED
Glucose-Capillary: 141 mg/dL — ABNORMAL HIGH (ref 65–99)
Glucose-Capillary: 143 mg/dL — ABNORMAL HIGH (ref 65–99)

## 2014-10-11 LAB — BRAIN NATRIURETIC PEPTIDE: B Natriuretic Peptide: 3611.8 pg/mL — ABNORMAL HIGH (ref 0.0–100.0)

## 2014-10-11 MED ORDER — DEXTROSE 50 % IV SOLN
50.0000 mL | Freq: Once | INTRAVENOUS | Status: AC
  Administered 2014-10-11: 50 mL via INTRAVENOUS
  Filled 2014-10-11: qty 50

## 2014-10-11 MED ORDER — CALCIUM CHLORIDE 10 % IV SOLN
1.0000 g | Freq: Once | INTRAVENOUS | Status: AC
  Administered 2014-10-11: 1 g via INTRAVENOUS
  Filled 2014-10-11: qty 10

## 2014-10-11 MED ORDER — ASPIRIN EC 81 MG PO TBEC
81.0000 mg | DELAYED_RELEASE_TABLET | Freq: Every day | ORAL | Status: DC
  Administered 2014-10-12 – 2014-10-16 (×5): 81 mg via ORAL
  Filled 2014-10-11 (×5): qty 1

## 2014-10-11 MED ORDER — DOPAMINE-DEXTROSE 3.2-5 MG/ML-% IV SOLN
0.0000 ug/kg/min | INTRAVENOUS | Status: DC
  Filled 2014-10-11: qty 250

## 2014-10-11 MED ORDER — PRAVASTATIN SODIUM 40 MG PO TABS: 80.0000 mg | ORAL_TABLET | Freq: Every day | ORAL | Status: DC

## 2014-10-11 MED ORDER — INSULIN ASPART 100 UNIT/ML IV SOLN
10.0000 [IU] | Freq: Once | INTRAVENOUS | Status: AC
  Administered 2014-10-11: 10 [IU] via INTRAVENOUS
  Filled 2014-10-11: qty 1

## 2014-10-11 MED ORDER — ATROPINE SULFATE 0.1 MG/ML IJ SOLN: 0.5000 mg | Freq: Once | INTRAMUSCULAR | Status: DC

## 2014-10-11 MED ORDER — HEPARIN SODIUM (PORCINE) 5000 UNIT/ML IJ SOLN
5000.0000 [IU] | Freq: Three times a day (TID) | INTRAMUSCULAR | Status: DC
  Administered 2014-10-12 – 2014-10-13 (×3): 5000 [IU] via SUBCUTANEOUS
  Filled 2014-10-11 (×4): qty 1

## 2014-10-11 MED ORDER — SODIUM CHLORIDE 0.9 % IJ SOLN
3.0000 mL | Freq: Two times a day (BID) | INTRAMUSCULAR | Status: DC
  Administered 2014-10-12 – 2014-10-16 (×8): 3 mL via INTRAVENOUS

## 2014-10-11 MED ORDER — SODIUM CHLORIDE 0.9 % IV BOLUS (SEPSIS)
250.0000 mL | Freq: Once | INTRAVENOUS | Status: AC
Start: 2014-10-11 — End: 2014-10-11
  Administered 2014-10-11: 250 mL via INTRAVENOUS

## 2014-10-11 MED ORDER — ATROPINE SULFATE 0.1 MG/ML IJ SOLN
INTRAMUSCULAR | Status: AC
  Administered 2014-10-11: 1 mg via INTRAVENOUS
  Filled 2014-10-11: qty 10

## 2014-10-11 MED ORDER — ASPIRIN 81 MG PO CHEW
324.0000 mg | CHEWABLE_TABLET | Freq: Once | ORAL | Status: AC
  Administered 2014-10-11: 324 mg via ORAL
  Filled 2014-10-11 (×2): qty 4

## 2014-10-11 MED ORDER — CALCIUM GLUCONATE 10 % IV SOLN: 1.0000 g | Freq: Once | INTRAVENOUS | Status: DC

## 2014-10-11 MED ORDER — CLOPIDOGREL BISULFATE 75 MG PO TABS
75.0000 mg | ORAL_TABLET | Freq: Every day | ORAL | Status: DC
  Administered 2014-10-12 – 2014-10-16 (×5): 75 mg via ORAL
  Filled 2014-10-11 (×6): qty 1

## 2014-10-11 MED ORDER — ATROPINE SULFATE 0.1 MG/ML IJ SOLN
1.0000 mg | Freq: Once | INTRAMUSCULAR | Status: AC
  Administered 2014-10-11: 1 mg via INTRAVENOUS

## 2014-10-11 MED ORDER — FUROSEMIDE 10 MG/ML IJ SOLN
40.0000 mg | Freq: Once | INTRAMUSCULAR | Status: DC
  Filled 2014-10-11: qty 4

## 2014-10-11 MED ORDER — SODIUM POLYSTYRENE SULFONATE 15 GM/60ML PO SUSP
45.0000 g | Freq: Once | ORAL | Status: AC
  Administered 2014-10-11: 45 g via ORAL
  Filled 2014-10-11: qty 180

## 2014-10-11 NOTE — H&P (Signed)
HPI: [name] is a 81 y.o. 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   She presents today with acute onset of SOB early this afternoon.  Upon arrival to ED she was found to be bradycardic prompting cardiology consultation.  Review of ECG c/w ventricular escape rhythm, no clear atrial rhythm noted.  Atropine was going to be administered as labs resulted with K 6.5 prompting treatment including Ca++.  With the above, HR increased to 50s, and ECG returned SB with LBBB similar to previous.    Discussion with patient and family led to clear goals of no intubation/mechanical ventilation, CPR or shocks.  Furthermore she is not interested in temporary pacemaker.  She actually states multiple times that she is "ready to go".  She proceeds to tell me that she has been admitted 9 times this year and that she is not interested in continuing with this pattern.  However, she is agreeable to ICU admission with aggressive medical management with the above exceptions.    She was seen earlier in the week and medications adjusted including increase in diuretics and potassium repletion.  Of note, her daughter voices concerns about her possibly not taking her medications correctly as she refuses to have someone do it for her.   Review of Systems:     Cardiac Review of Systems: {Y] = yes [ ]  = no  Chest Pain [    ]  Resting SOB [ x  ] Exertional SOB  [  ]  Orthopnea [  ]   Pedal Edema [   ]    Palpitations [  ] Syncope  [  ]   Presyncope [   ]  General Review of Systems: [Y] = yes [  ]=no Constitional: recent weight change [  ]; anorexia [  ]; fatigue [  ]; nausea [   ]; night sweats [  ]; fever [  ]; or chills [  ];                                                                      Dental: poor dentition[  ];   Eye : blurred vision [  ]; diplopia [   ]; vision changes [  ];  Amaurosis fugax[  ]; Resp: cough [  ];  wheezing[  ];  hemoptysis[  ]; shortness of breath[  ]; paroxysmal nocturnal dyspnea[  ]; dyspnea on exertion[  ]; or orthopnea[  ];  GI:  gallstones[  ], vomiting[  ];  dysphagia[  ]; melena[  ];  hematochezia [  ]; heartburn[  ];   GU: kidney stones [  ]; hematuria[  ];   dysuria [  ];  nocturia[  ];               Skin: rash [  ], swelling[  ];, hair loss[  ];  peripheral edema[  ];  or itching[  ]; Musculosketetal: myalgias[  ];  joint swelling[  ];  joint erythema[  ];  joint pain[  ];  back pain[  ];  Heme/Lymph: bruising[  ];  bleeding[  ];  anemia[  ];  Neuro: TIA[  ];  headaches[  ];  stroke[  ];  vertigo[  ];  seizures[  ];   paresthesias[  ];  difficulty walking[  ];  Psych:depression[  ]; anxiety[  ];  Endocrine: diabetes[  ];  thyroid dysfunction[  ];  Other:  Past Medical History  Diagnosis Date  . CAD (coronary artery disease)     a. 07/2008 Inf STEMI->RCA BMS;  b. 09/2008 CABGx2: LIMA->LAD, RIMA->OM as Y from LIMA;  c. s/p DES to prox RCA 05/2014, severe OM lesion being treated medically.  . Ischemic cardiomyopathy     a. EF 15-20% in 08/2014, prev 35-40% range.  . Chronic systolic CHF (congestive heart failure)     a. 06/2011 EF 40%  . Hypertension   . Hyperlipidemia   . Hiatal hernia     large  . Tobacco abuse   . Cerebrovascular disease   . COPD (chronic obstructive pulmonary disease)     a. on home O2.  . Arterial disease     peripheral arterial disease with claudication - bilateral SFA disease with ABI's 0.4-0.5 bilaterally   . Mitral regurgitation     a. Severe MR, not felt to be a surgical candidate.  . CKD (chronic kidney disease), stage IV     born with 1 kidney  . Thyroid nodule 10/2012    left dominant nodule  cytology:non-neoplastic goiter.   . Cataracts, both eyes     not surgical  . Personal history of colonic polyps - adenomas 05/09/2013  . Anemia   . Hypertensive renal disease   . Hypertensive heart disease with congestive heart failure   . Aorta aneurysm   . LVH (left ventricular hypertrophy)   . Bronchiectasis   . Atherosclerosis of abdominal aorta   . Chronic respiratory failure   . PAF (paroxysmal atrial fibrillation)     a. Dx 08/2014, not a candidate for anticoag due to anemia and need for DAPT - can reconsider once she comes off Plavix.    No current facility-administered medications on file prior to encounter.   Current Outpatient Prescriptions on File Prior to Encounter  Medication Sig Dispense Refill  . acetaminophen (TYLENOL) 325 MG tablet Take 650 mg by mouth daily as needed (pain).    Marland Kitchen albuterol (PROVENTIL HFA;VENTOLIN HFA) 108 (90 BASE) MCG/ACT inhaler Inhale 2 puffs into the lungs every 4 (four) hours as needed for wheezing or shortness of breath.     Marland Kitchen amiodarone (PACERONE) 200 MG tablet Take 1 tablet (200 mg total) by mouth daily. 30 tablet 1  . aspirin EC 81 MG tablet Take 81 mg by mouth daily.    . carvedilol (COREG) 3.125 MG tablet Take 1 tablet (3.125 mg total) by mouth 2 (two) times daily with a meal. 60 tablet 1  . clopidogrel (PLAVIX) 75 MG tablet Take 1 tablet (75 mg total) by mouth daily with breakfast. 30 tablet 6  . Coenzyme Q-10 100 MG capsule Take 100 mg by mouth daily.     . Melatonin 5 MG TABS Take 5 mg by mouth at bedtime as needed (sleep).    . nitroGLYCERIN (NITROSTAT) 0.4 MG SL tablet Place 1 tablet (0.4 mg total) under the tongue every 5 (five) minutes as needed. 25  tablet 6  . potassium chloride (K-DUR,KLOR-CON) 10 MEQ tablet Take 4 tablets (40 mEq total) by mouth daily. 120 tablet 3  . pravastatin (PRAVACHOL) 80 MG tablet Take 80 mg by mouth daily.    Marland Kitchen torsemide (DEMADEX) 20 MG tablet Take 4 tabs (80 mg) in the AM and take 2 tabs (40 mg) in the PM  (Patient taking differently: Take 20 mg by mouth See admin instructions. Take 4 tabs (80 mg) in the AM and take 2 tabs (40 mg) in the PM) 150 tablet 3     No Known Allergies  Social History   Social History  . Marital Status: Widowed    Spouse Name: N/A  . Number of Children: Y  . Years of Education: N/A   Occupational History  . 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    Social History Main Topics  . Smoking status: Former Smoker -- 1.00 packs/day for 60 years    Types: Cigarettes    Quit date: 07/12/2008  . Smokeless tobacco: Never Used  . Alcohol Use: No  . Drug Use: No  . Sexual Activity: No   Other Topics Concern  . Not on file   Social History Narrative   She lives in [place]. She is retired and widowed.     Family History  Problem Relation Age of Onset  . Coronary artery disease Mother     extensive family history; several other family members.  . Heart disease Mother     CHF  . Coronary artery disease Brother   . Leukemia Sister   . Leukemia Sister   . Leukemia Father   . Colon cancer Neg Hx   . Pancreatic cancer Neg Hx   . Stomach cancer Neg Hx   . Rectal cancer Neg Hx   . Kidney cancer Brother   . Heart failure Mother   . Heart attack Sister   . Stroke Neg Hx   . Hypertension Neg Hx     PHYSICAL EXAM: Filed Vitals:   10/11/14 2200  BP: 103/29  Pulse: 49  Temp:   Resp: 21   General:  Elderly, ill appearing. Mildly labored breathing HEENT: normal Neck: supple. no JVD. Carotids 2+ bilat; no bruits. No lymphadenopathy or thryomegaly appreciated. Cor: PMI nondisplaced. Bradycardic, distant S1 S2, holosytolic murmur near apex Lungs: clear anterior fields Abdomen: soft, nontender, nondistended. No hepatosplenomegaly. No bruits or masses. Good bowel sounds. Extremities: no cyanosis, clubbing, rash, +1 edema.  Very cold to touch, bluish discoloration, grossly delayed cap refill. Neuro: alert & oriented x 3, cranial nerves grossly  intact. moves all 4 extremities w/o difficulty. Affect pleasant.  ECG:  Ventricular escape at 30 bpm, no clear atrial rhythm identifiable.  ECG after atropine and HyerK treatment reveals SB with FAVB, LAD and LBBB similar to previous.   Results for orders placed or performed during the hospital encounter of 10/11/14 (from the past 24 hour(s))  Basic metabolic panel     Status: Abnormal   Collection Time: 10/11/14  7:47 PM  Result Value Ref Range   Sodium 131 (L) 135 - 145 mmol/L   Potassium 6.5 (HH) 3.5 - 5.1 mmol/L   Chloride 96 (L) 101 - 111 mmol/L   CO2 22 22 - 32 mmol/L   Glucose, Bld 230 (H) 65 - 99 mg/dL   BUN 48 (H) 6 - 20 mg/dL   Creatinine, Ser 3.12 (H) 0.44 - 1.00 mg/dL   Calcium 8.6 (L) 8.9 - 10.3 mg/dL  GFR calc non Af Amer 13 (L) >60 mL/min   GFR calc Af Amer 15 (L) >60 mL/min   Anion gap 13 5 - 15  CBC     Status: Abnormal   Collection Time: 10/11/14  7:47 PM  Result Value Ref Range   WBC 8.8 4.0 - 10.5 K/uL   RBC 4.24 3.87 - 5.11 MIL/uL   Hemoglobin 9.4 (L) 12.0 - 15.0 g/dL   HCT 34.0 (L) 36.0 - 46.0 %   MCV 80.2 78.0 - 100.0 fL   MCH 22.2 (L) 26.0 - 34.0 pg   MCHC 27.6 (L) 30.0 - 36.0 g/dL   RDW 20.8 (H) 11.5 - 15.5 %   Platelets 491 (H) 150 - 400 K/uL  Brain natriuretic peptide     Status: Abnormal   Collection Time: 10/11/14  7:47 PM  Result Value Ref Range   B Natriuretic Peptide 3611.8 (H) 0.0 - 100.0 pg/mL  I-stat troponin, ED     Status: None   Collection Time: 10/11/14  7:55 PM  Result Value Ref Range   Troponin i, poc 0.02 0.00 - 0.08 ng/mL   Comment 3          CBG monitoring, ED     Status: Abnormal   Collection Time: 10/11/14  9:08 PM  Result Value Ref Range   Glucose-Capillary 141 (H) 65 - 99 mg/dL  I-Stat CG4 Lactic Acid, ED     Status: Abnormal   Collection Time: 10/11/14  9:48 PM  Result Value Ref Range   Lactic Acid, Venous 6.18 (HH) 0.5 - 2.0 mmol/L   Comment NOTIFIED PHYSICIAN    Dg Chest Port 1 View  10/11/2014   CLINICAL DATA:   Shortness of breath for 1 day  EXAM: PORTABLE CHEST - 1 VIEW  COMPARISON:  09/25/2014  FINDINGS: Stable cardiomegaly. Postsurgical changes are again noted. A large hiatal hernia is again seen. Chronic atelectatic changes are noted in the bases bilaterally. No focal acute infiltrate is seen. No bony abnormality is noted.  IMPRESSION: No acute abnormality noted.   Electronically Signed   By: Inez Catalina M.D.   On: 10/11/2014 20:08     ASSESSMENT: 81 yo woman with ICM, chronic systolic CHF, CAD HTN, CKD and pAF who presents with SOB and found to be bradycardic and ECG c/w with ventricular escape.  She is on amiodarone (started in August), carvedilol and more importantly is hyperkalemic.  Of note, she does not appear hypervolemic but is cold with evidence of poor perfusion and confirmed with elevated lactic acid.  She is quite clear that she does not want aggressive measures including resuscitative measures (CPR, shock or ventilation) nor temporary pacemaker.  Her daughter and son-in-law at bedside and EDP discussed with son (HPOA) who lives in [place] and should be here tomorrow.    PLAN/DISCUSSION: Admit to CCU Correct hyperkalemia, repeat Ca++ prn ECG changes Hold AVN blocking agents Hold diuretics May consider dopamine for HR/BP support and CO Per discussions with patient, recommend palliative care consultation in AM for possible hospice at home

## 2014-10-11 NOTE — ED Notes (Signed)
MD at bedside. 

## 2014-10-11 NOTE — ED Provider Notes (Signed)
CSN: [id number]     Arrival date & time    History   First MD Initiated Contact with Patient 10/11/14 1935     Chief Complaint  Patient presents with  . Shortness of Breath     (Consider location/radiation/quality/duration/timing/severity/associated sxs/prior Treatment) HPI  81 year old female presents with an acute development of shortness of breath starting around noon today. Found to be saturating in the 80s. Has some nausea but denies chest pain. Feels like prior CHF. Has been having leg swelling for the last 3 days. Recently discharged from the hospital. Saw her cardiologist where he increased her torsemide and potassium due to starting to develop heart failure.  Past Medical History  Diagnosis Date  . CAD (coronary artery disease)     a. 07/2008 Inf STEMI->RCA BMS;  b. 09/2008 CABGx2: LIMA->LAD, RIMA->OM as Y from LIMA;  c. s/p DES to prox RCA 05/2014, severe OM lesion being treated medically.  . Ischemic cardiomyopathy     a. EF 15-20% in 08/2014, prev 35-40% range.  . Chronic systolic CHF (congestive heart failure)     a. 06/2011 EF 40%  . Hypertension   . Hyperlipidemia   . Hiatal hernia     large  . Tobacco abuse   . Cerebrovascular disease   . COPD (chronic obstructive pulmonary disease)     a. on home O2.  . Arterial disease     peripheral arterial disease with claudication - bilateral SFA disease with ABI's 0.4-0.5 bilaterally   . Mitral regurgitation     a. Severe MR, not felt to be a surgical candidate.  . CKD (chronic kidney disease), stage IV     born with 1 kidney  . Thyroid nodule 10/2012    left dominant nodule cytology:non-neoplastic goiter.   . Cataracts, both eyes     not surgical  . Personal history of colonic polyps - adenomas 05/09/2013  . Anemia   . Hypertensive renal disease   . Hypertensive heart disease with congestive heart failure   . Aorta aneurysm   . LVH (left ventricular hypertrophy)   . Bronchiectasis   . Atherosclerosis of abdominal aorta    . Chronic respiratory failure   . PAF (paroxysmal atrial fibrillation)     a. Dx 08/2014, not a candidate for anticoag due to anemia and need for DAPT - can reconsider once she comes off Plavix.   Past Surgical History  Procedure Laterality Date  . Laprotomy for bowel obstruction    . Coronary artery bypass graft  09/2008    aortic thoraic anuerysm resecton 2010.   Marland Kitchen Coronary stent placement  07/2008    BMS to RCA  . Flexible sigmoidoscopy N/A 05/09/2013    Procedure: FLEXIBLE SIGMOIDOSCOPY;  Surgeon: [name], MD;  Location: [facility];  Service: Endoscopy;  Laterality: N/A;  with possible hemorrhoid banding use gastrosope unsdeated  . Appendectomy    . Oophorectomy      one removed in 70's  . Hemorrhoid surgery    . Colonoscopy N/A 05/12/2013    Procedure: COLONOSCOPY;  Surgeon: [name], MD;  Location: [facility];  Service: Endoscopy;  Laterality: N/A;  . Cardiac catheterization N/A 05/30/2014    Procedure: Right/Left Heart Cath and Coronary/Graft Angiography;  Surgeon: Peter M Martinique, MD;  Location: [facility];  Service: Cardiovascular;  Laterality: N/A;  . Cardiac catheterization N/A 06/04/2014    Procedure: Coronary Stent Intervention;  Surgeon: Belva Crome, MD;  Location: [facility];  Service: Cardiovascular;  Laterality: N/A;  . Cardiac catheterization N/A 08/06/2014    Procedure: Left Heart Cath and Coronary Angiography;  Surgeon: Belva Crome, MD;  Location: [facility];  Service: Cardiovascular;  Laterality: N/A;   Family History  Problem Relation Age of Onset  . Coronary artery disease Mother     extensive family history; several other family members.  . Heart disease Mother     CHF  . Coronary artery disease Brother   . Leukemia Sister   . Leukemia Sister   . Leukemia Father   . Colon cancer Neg Hx   . Pancreatic cancer Neg Hx   . Stomach cancer Neg Hx   . Rectal cancer Neg Hx   . Kidney cancer Brother   . Heart failure  Mother   . Heart attack Sister   . Stroke Neg Hx   . Hypertension Neg Hx    Social History  Substance Use Topics  . Smoking status: Former Smoker -- 1.00 packs/day for 60 years    Types: Cigarettes    Quit date: 07/12/2008  . Smokeless tobacco: Never Used  . Alcohol Use: No   OB History    No data available     Review of Systems  Unable to perform ROS: Severe respiratory distress  Respiratory: Positive for shortness of breath.   Cardiovascular: Positive for leg swelling. Negative for chest pain.  Gastrointestinal: Negative for vomiting.      Allergies  Review of patient's allergies indicates no known allergies.  Home Medications   Prior to Admission medications   Medication Sig Start Date End Date Taking? Authorizing Provider  acetaminophen (TYLENOL) 325 MG tablet Take 650 mg by mouth daily as needed (pain).   Yes Historical Provider, MD  albuterol (PROVENTIL HFA;VENTOLIN HFA) 108 (90 BASE) MCG/ACT inhaler Inhale 2 puffs into the lungs every 4 (four) hours as needed for wheezing or shortness of breath.    Yes Historical Provider, MD  amiodarone (PACERONE) 200 MG tablet Take 1 tablet (200 mg total) by mouth daily. 09/13/14  Yes Dayna N Dunn, PA-C  aspirin EC 81 MG tablet Take 81 mg by mouth daily.   Yes Historical Provider, MD  carvedilol (COREG) 3.125 MG tablet Take 1 tablet (3.125 mg total) by mouth 2 (two) times daily with a meal. 09/13/14  Yes Dayna N Dunn, PA-C  clopidogrel (PLAVIX) 75 MG tablet Take 1 tablet (75 mg total) by mouth daily with breakfast. 07/05/14  Yes Burnell Blanks, MD  Coenzyme Q-10 100 MG capsule Take 100 mg by mouth daily.    Yes Historical Provider, MD  Melatonin 5 MG TABS Take 5 mg by mouth at bedtime as needed (sleep).   Yes Historical Provider, MD  nitroGLYCERIN (NITROSTAT) 0.4 MG SL tablet Place 1 tablet (0.4 mg total) under the tongue every 5 (five) minutes as needed. 04/13/14  Yes Burnell Blanks, MD  potassium chloride (K-DUR,KLOR-CON)  10 MEQ tablet Take 4 tablets (40 mEq total) by mouth daily. 10/08/14  Yes Larey Dresser, MD  pravastatin (PRAVACHOL) 80 MG tablet Take 80 mg by mouth daily.   Yes Historical Provider, MD  torsemide (DEMADEX) 20 MG tablet Take 4 tabs (80 mg) in the AM and take 2 tabs (40 mg) in the PM Patient taking differently: Take 20 mg by mouth See admin instructions. Take 4 tabs (80 mg) in the AM and take 2 tabs (40 mg) in the PM 10/08/14  Yes Larey Dresser, MD  BP 90/39 mmHg  Pulse 66  Temp(Src) 97.6 F (36.4 C)  Resp 19  SpO2 97% Physical Exam  Constitutional: She is oriented to person, place, and time. She appears well-developed and well-nourished. She appears distressed.  HENT:  Head: Normocephalic and atraumatic.  Right Ear: External ear normal.  Left Ear: External ear normal.  Nose: Nose normal.  Eyes: Right eye exhibits no discharge. Left eye exhibits no discharge.  Cardiovascular: Regular rhythm and normal heart sounds.  Bradycardia present.   Pulmonary/Chest: Effort normal. She has rales (diffuse).  Abdominal: Soft. She exhibits no distension. There is no tenderness.  Musculoskeletal: She exhibits edema (bilateral lower extremity pitting edema).  Neurological: She is alert and oriented to person, place, and time.  Skin: Skin is warm and dry.  Nursing note and vitals reviewed.   ED Course  Procedures (including critical care time) Labs Review Labs Reviewed  CBC - Abnormal; Notable for the following:    Hemoglobin 9.4 (*)    HCT 34.0 (*)    MCH 22.2 (*)    MCHC 27.6 (*)    RDW 20.8 (*)    Platelets 491 (*)    All other components within normal limits  BASIC METABOLIC PANEL  BRAIN NATRIURETIC PEPTIDE  I-STAT TROPOININ, ED    Imaging Review Dg Chest Port 1 View  10/11/2014   CLINICAL DATA:  Shortness of breath for 1 day  EXAM: PORTABLE CHEST - 1 VIEW  COMPARISON:  09/25/2014  FINDINGS: Stable cardiomegaly. Postsurgical changes are again noted. A large hiatal hernia is again  seen. Chronic atelectatic changes are noted in the bases bilaterally. No focal acute infiltrate is seen. No bony abnormality is noted.  IMPRESSION: No acute abnormality noted.   Electronically Signed   By: Inez Catalina M.D.   On: 10/11/2014 20:08   I have personally reviewed and evaluated these images and lab results as part of my medical decision-making.   EKG Interpretation   Date/Time:  Thursday October 11 2014 19:24:54 EDT Ventricular Rate:  39 PR Interval:    QRS Duration: 201 QT Interval:  600 QTC Calculation: 483 R Axis:   -67 Text Interpretation:  Atrial fibrillation Left bundle branch block  Baseline wander in lead(s) I rate is slower compared to Sept 13 2016  Confirmed by Regenia Skeeter  MD, SCOTT [phone]) on 10/11/2014 8:37:18 PM       EKG Interpretation  Date/Time:  Thursday October 11 2014 20:44:11 EDT Ventricular Rate:  31 PR Interval:  236 QRS Duration: 229 QT Interval:  627 QTC Calculation: 450 R Axis:   -174 Text Interpretation:  Incomplete analysis due to missing data in precordial lead(s) Sinus bradycardia RBBB and LPFB Borderline ST depression, lateral leads Missing lead(s): V2 Confirmed by Regenia Skeeter  MD, SCOTT ([id number]) on 10/11/2014 9:25:20 PM        EKG Interpretation  Date/Time:  Thursday October 11 2014 20:44:11 EDT Ventricular Rate:  31 PR Interval:  236 QRS Duration: 229 QT Interval:  627 QTC Calculation: 450 R Axis:   -174 Text Interpretation:  Incomplete analysis due to missing data in precordial lead(s) Sinus bradycardia RBBB and LPFB Borderline ST depression, lateral leads Missing lead(s): V2 Confirmed by GOLDSTON  MD, SCOTT ([id number]) on 10/11/2014 9:25:20 PM       CRITICAL CARE Performed by: Sherwood Gambler T   Total critical care time: 75 minutes  Critical care time was exclusive of separately billable procedures and treating other patients.  Critical care was necessary to treat or prevent  imminent or life-threatening  deterioration.  Critical care was time spent personally by me on the following activities: development of treatment plan with patient and/or surrogate as well as nursing, discussions with consultants, evaluation of patient's response to treatment, examination of patient, obtaining history from patient or surrogate, ordering and performing treatments and interventions, ordering and review of laboratory studies, ordering and review of radiographic studies, pulse oximetry and re-evaluation of patient's condition.  MDM   Final diagnoses:  Hyperkalemia  Lactic acidosis  Acute on chronic renal failure    Patient initially bradycardic and symptoms consistent with CHF. Patient has borderline blood pressures in the 90s that then dropped. Noted to be acutely hyperkalemic with mild increase in her chronic kidney disease. Turns out she has been taking extra potassium, possibly not taking extra diarrhetic set she was supposed to. Patient is awake, alert, and at her normal baseline. She was given IV calcium, IV glucose, and IV insulin. Her bradycardia seemed to stabilize and her blood pressure did improve. This seems consistent with hyperkalemia. Cardiology at the bedside and is assisting with management and will admit to the ICU. She specifically states and clearly states that she does not want to be resuscitated with CPR, shocking, or intubation. She would want treatment up until this point. Family at the bedside confirms this. Patient to be admitted to the cardiac ICU.    Sherwood Gambler, MD 10/12/14 [phone]

## 2014-10-11 NOTE — ED Notes (Signed)
Patient with shortness of breath that started this morning.  Patient states that it increased around lunch times.  FD found patient in the 80's, diaphoretic.  Patient was placed on NRB by FD and upon arrival of EMS placed on 4L [place].  She did have some nausea with the shortness of breath, was given 2 doses of 4mg  of Zofran en route to ED.  Patient with history of AFIb and CHF.

## 2014-10-12 DIAGNOSIS — N189 Chronic kidney disease, unspecified: Secondary | ICD-10-CM

## 2014-10-12 DIAGNOSIS — E875 Hyperkalemia: Secondary | ICD-10-CM

## 2014-10-12 DIAGNOSIS — I442 Atrioventricular block, complete: Secondary | ICD-10-CM

## 2014-10-12 DIAGNOSIS — Z515 Encounter for palliative care: Secondary | ICD-10-CM | POA: Insufficient documentation

## 2014-10-12 DIAGNOSIS — N179 Acute kidney failure, unspecified: Secondary | ICD-10-CM

## 2014-10-12 LAB — CBC
HCT: 31.9 % — ABNORMAL LOW (ref 36.0–46.0)
HEMATOCRIT: 31.9 % — AB (ref 36.0–46.0)
HEMOGLOBIN: 9.2 g/dL — AB (ref 12.0–15.0)
Hemoglobin: 8.9 g/dL — ABNORMAL LOW (ref 12.0–15.0)
MCH: 22.9 pg — ABNORMAL LOW (ref 26.0–34.0)
MCH: 22.1 pg — AB (ref 26.0–34.0)
MCHC: 27.9 g/dL — ABNORMAL LOW (ref 30.0–36.0)
MCHC: 28.8 g/dL — ABNORMAL LOW (ref 30.0–36.0)
MCV: 79.4 fL (ref 78.0–100.0)
MCV: 79.6 fL (ref 78.0–100.0)
PLATELETS: 459 10*3/uL — AB (ref 150–400)
Platelets: 478 10*3/uL — ABNORMAL HIGH (ref 150–400)
RBC: 4.01 MIL/uL (ref 3.87–5.11)
RBC: 4.02 MIL/uL (ref 3.87–5.11)
RDW: 20.5 % — AB (ref 11.5–15.5)
RDW: 20.7 % — AB (ref 11.5–15.5)
WBC: 10.3 10*3/uL (ref 4.0–10.5)
WBC: 12.1 10*3/uL — AB (ref 4.0–10.5)

## 2014-10-12 LAB — BASIC METABOLIC PANEL
ANION GAP: 9 (ref 5–15)
ANION GAP: 10 (ref 5–15)
Anion gap: 11 (ref 5–15)
Anion gap: 8 (ref 5–15)
BUN: 50 mg/dL — ABNORMAL HIGH (ref 6–20)
BUN: 50 mg/dL — ABNORMAL HIGH (ref 6–20)
BUN: 48 mg/dL — AB (ref 6–20)
BUN: 49 mg/dL — AB (ref 6–20)
CALCIUM: 8.8 mg/dL — AB (ref 8.9–10.3)
CALCIUM: 9.2 mg/dL (ref 8.9–10.3)
CALCIUM: 9.9 mg/dL (ref 8.9–10.3)
CO2: 26 mmol/L (ref 22–32)
CO2: 27 mmol/L (ref 22–32)
CO2: 28 mmol/L (ref 22–32)
CO2: 29 mmol/L (ref 22–32)
Calcium: 9.8 mg/dL (ref 8.9–10.3)
Chloride: 100 mmol/L — ABNORMAL LOW (ref 101–111)
Chloride: 100 mmol/L — ABNORMAL LOW (ref 101–111)
Chloride: 100 mmol/L — ABNORMAL LOW (ref 101–111)
Chloride: 101 mmol/L (ref 101–111)
Creatinine, Ser: 3.34 mg/dL — ABNORMAL HIGH (ref 0.44–1.00)
Creatinine, Ser: 3.44 mg/dL — ABNORMAL HIGH (ref 0.44–1.00)
Creatinine, Ser: 3.52 mg/dL — ABNORMAL HIGH (ref 0.44–1.00)
Creatinine, Ser: 3.55 mg/dL — ABNORMAL HIGH (ref 0.44–1.00)
GFR calc Af Amer: 13 mL/min — ABNORMAL LOW (ref >60)
GFR calc Af Amer: 13 mL/min — ABNORMAL LOW (ref >60)
GFR calc Af Amer: 13 mL/min — ABNORMAL LOW (ref >60)
GFR, EST AFRICAN AMERICAN: 13 mL/min — AB (ref >60)
GFR, EST NON AFRICAN AMERICAN: 11 mL/min — AB (ref >60)
GFR, EST NON AFRICAN AMERICAN: 11 mL/min — AB (ref >60)
GFR, EST NON AFRICAN AMERICAN: 11 mL/min — AB (ref >60)
GFR, EST NON AFRICAN AMERICAN: 12 mL/min — AB (ref >60)
GLUCOSE: 124 mg/dL — AB (ref 65–99)
GLUCOSE: 112 mg/dL — AB (ref 65–99)
GLUCOSE: 117 mg/dL — AB (ref 65–99)
GLUCOSE: 62 mg/dL — AB (ref 65–99)
POTASSIUM: 4.2 mmol/L (ref 3.5–5.1)
POTASSIUM: 5.9 mmol/L — AB (ref 3.5–5.1)
POTASSIUM: 6 mmol/L — AB (ref 3.5–5.1)
POTASSIUM: 6.2 mmol/L — AB (ref 3.5–5.1)
SODIUM: 138 mmol/L (ref 135–145)
SODIUM: 138 mmol/L (ref 135–145)
Sodium: 136 mmol/L (ref 135–145)
Sodium: 137 mmol/L (ref 135–145)

## 2014-10-12 LAB — HEPATIC FUNCTION PANEL
ALBUMIN: 3.1 g/dL — AB (ref 3.5–5.0)
ALT: 262 U/L — AB (ref 14–54)
AST: 442 U/L — AB (ref 15–41)
Alkaline Phosphatase: 90 U/L (ref 38–126)
Bilirubin, Direct: 0.3 mg/dL (ref 0.1–0.5)
Indirect Bilirubin: 0.6 mg/dL (ref 0.3–0.9)
TOTAL PROTEIN: 5.4 g/dL — AB (ref 6.5–8.1)
Total Bilirubin: 0.9 mg/dL (ref 0.3–1.2)

## 2014-10-12 LAB — PHOSPHORUS: PHOSPHORUS: 5.5 mg/dL — AB (ref 2.5–4.6)

## 2014-10-12 LAB — MRSA PCR SCREENING: MRSA by PCR: NEGATIVE

## 2014-10-12 LAB — GLUCOSE, CAPILLARY
GLUCOSE-CAPILLARY: 100 mg/dL — AB (ref 65–99)
Glucose-Capillary: 80 mg/dL (ref 65–99)

## 2014-10-12 LAB — MAGNESIUM: Magnesium: 2.5 mg/dL — ABNORMAL HIGH (ref 1.7–2.4)

## 2014-10-12 MED ORDER — ONDANSETRON HCL 4 MG/2ML IJ SOLN: 4.0000 mg | Freq: Four times a day (QID) | INTRAMUSCULAR | Status: DC | PRN

## 2014-10-12 MED ORDER — PROMETHAZINE HCL 25 MG/ML IJ SOLN
12.5000 mg | Freq: Four times a day (QID) | INTRAMUSCULAR | Status: DC | PRN
  Administered 2014-10-12: 12.5 mg via INTRAVENOUS
  Filled 2014-10-12: qty 1

## 2014-10-12 MED ORDER — ONDANSETRON HCL 4 MG/2ML IJ SOLN
INTRAMUSCULAR | Status: AC
  Administered 2014-10-12: 4 mg
  Filled 2014-10-12: qty 2

## 2014-10-12 MED ORDER — SODIUM POLYSTYRENE SULFONATE 15 GM/60ML PO SUSP
30.0000 g | Freq: Once | ORAL | Status: AC
  Administered 2014-10-12: 30 g via ORAL
  Filled 2014-10-12: qty 120

## 2014-10-12 MED ORDER — BISACODYL 10 MG RE SUPP
10.0000 mg | Freq: Once | RECTAL | Status: AC
Start: 2014-10-12 — End: 2014-10-12
  Administered 2014-10-12: 10 mg via RECTAL
  Filled 2014-10-12: qty 1

## 2014-10-12 MED ORDER — ALPRAZOLAM 0.25 MG PO TABS
0.2500 mg | ORAL_TABLET | Freq: Once | ORAL | Status: AC
  Administered 2014-10-12: 0.25 mg via ORAL
  Filled 2014-10-12: qty 1

## 2014-10-12 NOTE — Progress Notes (Addendum)
Patient ID: [name], female   DOB: Jun 16, 1927, 81 y.o.   MRN: [id number]   SUBJECTIVE: HR improved, in 60s sinus.  BP stable. K still high at 6.  She denies dyspnea at rest, awake and alert.   Scheduled Meds: . aspirin EC  81 mg Oral Daily  . clopidogrel  75 mg Oral Q breakfast  . heparin  5,000 Units Subcutaneous 3 times per day  . pravastatin  80 mg Oral q1800  . sodium chloride  3 mL Intravenous Q12H  . sodium polystyrene  30 g Oral Once   Continuous Infusions: . DOPamine Stopped (10/11/14 2104)   PRN Meds:.ondansetron (ZOFRAN) IV    Filed Vitals:   10/12/14 0400 10/12/14 0405 10/12/14 0500 10/12/14 0725  BP: 122/50  103/84   Pulse: 65  62   Temp: 97.4 F (36.3 C)   98.1 F (36.7 C)  TempSrc: Oral Oral  Oral  Resp: 27  21   Height:      Weight:      SpO2: 95%  94%    No intake or output data in the 24 hours ending 10/12/14 0850  LABS: Basic Metabolic Panel:  Recent Labs  10/12/14 0307 10/12/14 0720  NA 137 138  K 6.2* 6.0*  CL 100* 100*  CO2 26 29  GLUCOSE 117* 112*  BUN 49* 50*  CREATININE 3.52* 3.55*  CALCIUM 9.8 9.2  MG 2.5*  --   PHOS 5.5*  --    Liver Function Tests:  Recent Labs  10/12/14 0307  AST 442*  ALT 262*  ALKPHOS 90  BILITOT 0.9  PROT 5.4*  ALBUMIN 3.1*   No results for input(s): LIPASE, AMYLASE in the last 72 hours. CBC:  Recent Labs  10/11/14 2358 10/12/14 0307  WBC 10.3 12.1*  HGB 8.9* 9.2*  HCT 31.9* 31.9*  MCV 79.4 79.6  PLT 459* 478*   Cardiac Enzymes: No results for input(s): CKTOTAL, CKMB, CKMBINDEX, TROPONINI in the last 72 hours. BNP: Invalid input(s): POCBNP D-Dimer: No results for input(s): DDIMER in the last 72 hours. Hemoglobin A1C: No results for input(s): HGBA1C in the last 72 hours. Fasting Lipid Panel: No results for input(s): CHOL, HDL, LDLCALC, TRIG, CHOLHDL, LDLDIRECT in the last 72 hours. Thyroid Function Tests: No results for input(s): TSH, T4TOTAL, T3FREE, THYROIDAB in the last  72 hours.  Invalid input(s): FREET3 Anemia Panel: No results for input(s): VITAMINB12, FOLATE, FERRITIN, TIBC, IRON, RETICCTPCT in the last 72 hours.  RADIOLOGY: Dg Chest 2 View  09/25/2014   CLINICAL DATA:  Difficulty breathing and weakness since yesterday. Smoker.  EXAM: CHEST  2 VIEW  COMPARISON:  08/10/2014 and 08/05/2014 as well as 06/01/2014 and chest CT 06/05/2014  FINDINGS: Patient is rotated to the right. Median sternotomy wires unchanged. Lungs are adequately inflated and demonstrate mild stable bibasilar opacification likely small effusions with associated atelectasis/ scarring. Subtle prominence of the perihilar markings likely mild vascular congestion. Stable prominence of the right hilum. Stable moderate cardiomegaly. Evidence of moderate size hiatal hernia. Remainder of the exam is unchanged.  IMPRESSION: Stable moderate cardiomegaly with suggestion of minimal vascular congestion.  Chronic bibasilar opacification compatible with small effusions and associated atelectasis/ scarring.  Moderate size hiatal hernia.   Electronically Signed   By: Marin Olp M.D.   On: 09/25/2014 23:52   Dg Chest Port 1 View  10/11/2014   CLINICAL DATA:  Shortness of breath for 1 day  EXAM: PORTABLE CHEST - 1 VIEW  COMPARISON:  09/25/2014  FINDINGS: Stable cardiomegaly. Postsurgical changes are again noted. A large hiatal hernia is again seen. Chronic atelectatic changes are noted in the bases bilaterally. No focal acute infiltrate is seen. No bony abnormality is noted.  IMPRESSION: No acute abnormality noted.   Electronically Signed   By: Inez Catalina M.D.   On: 10/11/2014 20:08    PHYSICAL EXAM General: NAD Neck: JVP 12 cm, no thyromegaly or thyroid nodule.  Lungs: Decreased breath sounds at bases. CV: Lateral PMI.  Heart regular S1/S2, no S3/S4, 2/6 HSM apex.  1+ ankle edema.   Abdomen: Soft, nontender, no hepatosplenomegaly, no distention.  Neurologic: Alert and oriented x 3.  Psych: Normal  affect. Extremities: No clubbing or cyanosis.   TELEMETRY: Reviewed telemetry pt in NSR in 80s  ASSESSMENT AND PLAN: 81 yo woman with ischemic cardiomyopathy/chronic systolic CHF, CAD, HTN, CKD and pAF who presented with SOB and was found to be bradycardic and ECG c/w with ventricular escape. She is on amiodarone (started in August), carvedilol and more importantly is hyperkalemic. 1. Bradycardia: Resolved, now in NSR in 60s.  Suspect initial wide complex rhythm was due to hyperkalemia.  K remains 6.  - Continue to correct K.  - Hold amiodarone and Coreg.   2. AKI on CKD: Progressive cardiorenal syndrome.  Creatinine up to 3.55, was at 2.1.  She has been on torsemide 60 qam/40 qpm.  Daughter thinks she may not be taking her meds correctly.  Holding diuretics for now.  Not HD candidate.  3. Acute/chronic systolic CHF: Ischemic cardiomyopathy, EF 30% with diffuse hypokinesis. On exam today, she remains volume overloaded.  However, now with AKI on CKD and hyperkalemia.  Will hold torsemide for now.  I think we have reached a point where we are not going to be able to effectively diurese her given worsening of renal function.  She is not a dialysis candidate. 4. CAD: s/p CABG. DES to native RCA in 5/16. She is on ASA 81 and Plavix, Continue.  Hold statin given elevated LFTs. 5. Atrial fibrillation: Paroxysmal, had been on amiodarone. NSR today. She is a poor candidate for triple anticoagulant therapy. Hold amiodarone given bradycardia and elevated LFTs.  6. Mitral regurgitation: Severe by echo. Suspect infarct-related MR. She is not a candidate for open MV repair/replacement. Mitral valve clip could be an option but she prefers to avoid invasive management at this point, which I think is reasonable.  7. Hyperkalemia: Will give dose of Kayexalate now and repeat BMET this afternoon.  8. We discussed hospice, she is interested.  Will consult pallliative care.   Loralie Champagne 10/12/2014 8:59  AM  Afternoon BMET shows K now 4.2.   Loralie Champagne 10/12/2014

## 2014-10-12 NOTE — Progress Notes (Signed)
CRITICAL VALUE ALERT  Critical value received:  Potassium 6.2 Date of notification:  10/12/2014  Time of notification:  0425  Critical value read back:yes  Nurse who received alert:  Purcell Nails  MD notified (1st page):  Philbert Riser  Time of first page:  0426  MD notified (2nd page):  Time of second page:  Responding MD: Philbert Riser  Time MD responded:  [phone]

## 2014-10-12 NOTE — Progress Notes (Signed)
Initial Nutrition Assessment  DOCUMENTATION CODES:   Severe malnutrition in context of chronic illness, Underweight  INTERVENTION:   -Magic Cup TID with meals  NUTRITION DIAGNOSIS:   Malnutrition related to chronic illness as evidenced by severe depletion of body fat, severe depletion of muscle mass.  GOAL:   Patient will meet greater than or equal to 90% of their needs  MONITOR:   PO intake, Supplement acceptance, Labs, Weight trends, Skin, I & O's  REASON FOR ASSESSMENT:   Malnutrition Screening Tool    ASSESSMENT:   [name] is a 81 y.o. 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   Pt admitted for bradycardia.   Pt sleeping soundly at time of visit. Reviewed RD note from previous admission 09/25/14. Pt has hx of severe malnutrition, which is ongoing. Nutrition-focused physical exam completed on 09/26/13 and revealed severe fat and muscle depletion; RD suspects no changes to exam at this time. Pt with continued wt loss since past admission.   Palliative care consult pending; potential to transition to hospice care.   Labs reviewed: K: 6.0, Phos: 5.5, Mg: 2.5.   Diet Order:  Diet Heart Room service appropriate?: Yes; Fluid consistency:: Thin  Skin:  Reviewed, no issues  Last BM:  10/12/14  Height:   Ht Readings from Last 1 Encounters:  10/11/14 5\' 8"  (1.727 m)    Weight:   Wt Readings from Last 1 Encounters:  10/11/14 121 lb 0.5 oz (54.9 kg)    Ideal Body Weight:  63.6 kg  BMI:  Body mass index is 18.41 kg/(m^2).  Estimated Nutritional Needs:   Kcal:  1500-1700  Protein:  75-85 grams  Fluid:  1.5-1.7  L  EDUCATION NEEDS:   No education needs identified at this time  Jenifer A. Jimmye Norman, RD, LDN, CDE Pager: [phone] After hours Pager: [phone]

## 2014-10-12 NOTE — Progress Notes (Signed)
Family meeting set with son, Lonny Prude, who is HCPOA, and pt's daughter, Aileen Pilot for 10/13/14 at 10 am. Thank you for consulting Palliative Medicine Team Romona Curls, ANP-ACHPN

## 2014-10-12 NOTE — Care Management Note (Signed)
Case Management Note  Patient Details  Name: [name] MRN: [id number] Date of Birth: [birth year]  Subjective/Objective:    Adm w hyperkalemia, heart block                Action/Plan: lives at home, pcp dr Claris Gower   Expected Discharge Date:                 Expected Discharge Plan:     In-House Referral:     Discharge planning Services     Post Acute Care Choice:    Choice offered to:     DME Arranged:    DME Agency:     HH Arranged:    [place] Agency:     Status of Service:     Medicare Important Message Given:    Date Medicare IM Given:    Medicare IM give by:    Date Additional Medicare IM Given:    Additional Medicare Important Message give by:     If discussed at [place] of Stay Meetings, dates discussed:    Additional Comments: ur review done  Lacretia Leigh, RN 10/12/2014, 8:05 AM

## 2014-10-13 DIAGNOSIS — E43 Unspecified severe protein-calorie malnutrition: Secondary | ICD-10-CM | POA: Insufficient documentation

## 2014-10-13 DIAGNOSIS — I5042 Chronic combined systolic (congestive) and diastolic (congestive) heart failure: Secondary | ICD-10-CM

## 2014-10-13 LAB — COMPREHENSIVE METABOLIC PANEL
ALK PHOS: 70 U/L (ref 38–126)
ALT: 152 U/L — ABNORMAL HIGH (ref 14–54)
AST: 149 U/L — ABNORMAL HIGH (ref 15–41)
Albumin: 2.6 g/dL — ABNORMAL LOW (ref 3.5–5.0)
Anion gap: 9 (ref 5–15)
BILIRUBIN TOTAL: 0.9 mg/dL (ref 0.3–1.2)
BUN: 49 mg/dL — ABNORMAL HIGH (ref 6–20)
CALCIUM: 8.4 mg/dL — AB (ref 8.9–10.3)
CO2: 30 mmol/L (ref 22–32)
CREATININE: 3.27 mg/dL — AB (ref 0.44–1.00)
Chloride: 101 mmol/L (ref 101–111)
GFR calc non Af Amer: 12 mL/min — ABNORMAL LOW (ref >60)
GFR, EST AFRICAN AMERICAN: 14 mL/min — AB (ref >60)
GLUCOSE: 82 mg/dL (ref 65–99)
Potassium: 3.4 mmol/L — ABNORMAL LOW (ref 3.5–5.1)
SODIUM: 140 mmol/L (ref 135–145)
TOTAL PROTEIN: 4.7 g/dL — AB (ref 6.5–8.1)

## 2014-10-13 LAB — CBC
HCT: 26.5 % — ABNORMAL LOW (ref 36.0–46.0)
Hemoglobin: 7.7 g/dL — ABNORMAL LOW (ref 12.0–15.0)
MCH: 22.7 pg — AB (ref 26.0–34.0)
MCHC: 29.1 g/dL — AB (ref 30.0–36.0)
MCV: 78.2 fL (ref 78.0–100.0)
PLATELETS: 392 10*3/uL (ref 150–400)
RBC: 3.39 MIL/uL — AB (ref 3.87–5.11)
RDW: 20.5 % — ABNORMAL HIGH (ref 11.5–15.5)
WBC: 7.3 10*3/uL (ref 4.0–10.5)

## 2014-10-13 MED ORDER — POTASSIUM CHLORIDE CRYS ER 20 MEQ PO TBCR
20.0000 meq | EXTENDED_RELEASE_TABLET | Freq: Once | ORAL | Status: AC
  Administered 2014-10-13: 20 meq via ORAL
  Filled 2014-10-13: qty 1

## 2014-10-13 MED ORDER — CARVEDILOL 3.125 MG PO TABS
3.1250 mg | ORAL_TABLET | Freq: Two times a day (BID) | ORAL | Status: DC
  Administered 2014-10-13 – 2014-10-16 (×6): 3.125 mg via ORAL
  Filled 2014-10-13 (×6): qty 1

## 2014-10-13 MED ORDER — AMIODARONE HCL 100 MG PO TABS
100.0000 mg | ORAL_TABLET | Freq: Every day | ORAL | Status: DC
  Administered 2014-10-13 – 2014-10-16 (×4): 100 mg via ORAL
  Filled 2014-10-13 (×4): qty 1

## 2014-10-13 NOTE — Progress Notes (Signed)
Patient ID: [name], female   DOB: 1927/03/14, 81 y.o.   MRN: [id number]   SUBJECTIVE:   Bradycardia resolved with treatment of hyperkalemia. Feeling better. Renal function starting to improve. K now 3.4  Seen by Palliative Care. Now DNR. Wants home hospice after hospital treatment completed.   Scheduled Meds: . aspirin EC  81 mg Oral Daily  . clopidogrel  75 mg Oral Q breakfast  . heparin  5,000 Units Subcutaneous 3 times per day  . sodium chloride  3 mL Intravenous Q12H   Continuous Infusions: . DOPamine Stopped (10/11/14 2104)   PRN Meds:.ondansetron (ZOFRAN) IV    Filed Vitals:   10/13/14 0500 10/13/14 0600 10/13/14 0711 10/13/14 1103  BP: 144/52 117/55 131/52 125/46  Pulse: 67 64 65 64  Temp:   97.8 F (36.6 C) 98.1 F (36.7 C)  TempSrc:   Oral Oral  Resp: 23 19 21 14   Height:      Weight:      SpO2: 97% 96% 96% 99%    Intake/Output Summary (Last 24 hours) at 10/13/14 1313 Last data filed at 10/13/14 0930  Gross per 24 hour  Intake    180 ml  Output    925 ml  Net   -745 ml    LABS: Basic Metabolic Panel:  Recent Labs  10/12/14 0307  10/12/14 1432 10/13/14 0312  NA 137  < > 138 140  K 6.2*  < > 4.2 3.4*  CL 100*  < > 100* 101  CO2 26  < > 28 30  GLUCOSE 117*  < > 124* 82  BUN 49*  < > 50* 49*  CREATININE 3.52*  < > 3.44* 3.27*  CALCIUM 9.8  < > 8.8* 8.4*  MG 2.5*  --   --   --   PHOS 5.5*  --   --   --   < > = values in this interval not displayed. Liver Function Tests:  Recent Labs  10/12/14 0307 10/13/14 0312  AST 442* 149*  ALT 262* 152*  ALKPHOS 90 70  BILITOT 0.9 0.9  PROT 5.4* 4.7*  ALBUMIN 3.1* 2.6*   No results for input(s): LIPASE, AMYLASE in the last 72 hours. CBC:  Recent Labs  10/12/14 0307 10/13/14 0312  WBC 12.1* 7.3  HGB 9.2* 7.7*  HCT 31.9* 26.5*  MCV 79.6 78.2  PLT 478* 392   Cardiac Enzymes: No results for input(s): CKTOTAL, CKMB, CKMBINDEX, TROPONINI in the last 72 hours. BNP: Invalid  input(s): POCBNP D-Dimer: No results for input(s): DDIMER in the last 72 hours. Hemoglobin A1C: No results for input(s): HGBA1C in the last 72 hours. Fasting Lipid Panel: No results for input(s): CHOL, HDL, LDLCALC, TRIG, CHOLHDL, LDLDIRECT in the last 72 hours. Thyroid Function Tests: No results for input(s): TSH, T4TOTAL, T3FREE, THYROIDAB in the last 72 hours.  Invalid input(s): FREET3 Anemia Panel: No results for input(s): VITAMINB12, FOLATE, FERRITIN, TIBC, IRON, RETICCTPCT in the last 72 hours.  RADIOLOGY: Dg Chest 2 View  09/25/2014   CLINICAL DATA:  Difficulty breathing and weakness since yesterday. Smoker.  EXAM: CHEST  2 VIEW  COMPARISON:  08/10/2014 and 08/05/2014 as well as 06/01/2014 and chest CT 06/05/2014  FINDINGS: Patient is rotated to the right. Median sternotomy wires unchanged. Lungs are adequately inflated and demonstrate mild stable bibasilar opacification likely small effusions with associated atelectasis/ scarring. Subtle prominence of the perihilar markings likely mild vascular congestion. Stable prominence of the right hilum. Stable moderate cardiomegaly. Evidence  of moderate size hiatal hernia. Remainder of the exam is unchanged.  IMPRESSION: Stable moderate cardiomegaly with suggestion of minimal vascular congestion.  Chronic bibasilar opacification compatible with small effusions and associated atelectasis/ scarring.  Moderate size hiatal hernia.   Electronically Signed   By: Marin Olp M.D.   On: 09/25/2014 23:52   Dg Chest Port 1 View  10/11/2014   CLINICAL DATA:  Shortness of breath for 1 day  EXAM: PORTABLE CHEST - 1 VIEW  COMPARISON:  09/25/2014  FINDINGS: Stable cardiomegaly. Postsurgical changes are again noted. A large hiatal hernia is again seen. Chronic atelectatic changes are noted in the bases bilaterally. No focal acute infiltrate is seen. No bony abnormality is noted.  IMPRESSION: No acute abnormality noted.   Electronically Signed   By: Inez Catalina  M.D.   On: 10/11/2014 20:08    PHYSICAL EXAM General: NAD Neck: JVP 7 cm, no thyromegaly or thyroid nodule.  Lungs: Decreased breath sounds at bases. CV: Lateral PMI.  Heart regular S1/S2, no S3/S4, 3/6 HSM apex.  Trace edema.   Abdomen: Soft, nontender, no hepatosplenomegaly, no distention.  Neurologic: Alert and oriented x 3.  Psych: Normal affect. Extremities: No clubbing or cyanosis.   TELEMETRY: Reviewed telemetry pt in NSR in 70s  ASSESSMENT AND PLAN: 81 yo woman with ischemic cardiomyopathy/chronic systolic CHF, CAD, HTN, CKD and pAF who presented with SOB and was found to be bradycardic and ECG c/w with ventricular escape. She is on amiodarone (started in August), carvedilol and more importantly is hyperkalemic. 1. Bradycardia: Due to hyperkalemia Resolved. K 3.4 - Can restart low-dose carvedilol and amio - no indication for PPM and patient/famiy do not want 2. AKI on CKD: Cardiorenal syndrome.  Now improving. She has been on torsemide 60 qam/40 qpm.  Daughter thinks she may not be taking her meds correctly. - continue to hold diuretics at least one more day   3. Acute/chronic systolic CHF: Ischemic cardiomyopathy, EF 30% with diffuse hypokinesis. On exam today, volume status not too bad.  - Will hold torsemide for now.  4. CAD: s/p CABG. DES to native RCA in 5/16. She is on ASA 81 and Plavix, Continue.  Hold statin given elevated LFTs. 5. Atrial fibrillation: Paroxysmal, had been on amiodarone. NSR today. She is a poor candidate for triple anticoagulant therapy.  - Will restart amio 6. Mitral regurgitation: Severe by echo. Suspect infarct-related MR. She is not a candidate for open MV repair/replacement. Mitral valve clip could be an option but she prefers to avoid invasive management at this point, which I think is reasonable.  7. Hyperkalemia: resolved> K now low. Will supp gently 8. DNR/DNI wants home hospice   Transfer to tele. PT to see.   Glori Bickers  MD 10/13/2014

## 2014-10-13 NOTE — Consult Note (Signed)
Consultation Note Date: 10/13/2014   Patient Name: [name]  DOB: 1927-02-13  MRN: [id number]  Age / Sex: 81 y.o., female   PCP: Kaleen Mask, MD Referring Physician: Laurey Morale, MD  Reason for Consultation: Establishing goals of care  Palliative Care Assessment and Plan Summary of Established Goals of Care and Medical Treatment Preferences   Clinical Assessment/Narrative: Pt is a 81 yo female with CHF, EF 15 %, CKD stage 4, CAD with h/o MI, CABG, bronchiectasis, and severe MR. Upon admission she was hyperkalemic but this has since been resolved. I met with her, her sons, Loney Laurence and daughter Larene Beach for GOC conversation. Mrs. Pellman shows good insight into her disease but also shares with her family that "she is ready to go when it's her time". She does not want to pursue further testing, no pacemaker. She also verbalizes she wants to be at home with her family as long as possible. Her goal is to not come back to the hospital. We did discuss hospice services and although she states "that's never a word you want to hear" she does agree that what they have to offer is in keeping with her goals. Family in agreement.   Contacts/Participants in Discussion: Primary Decision Maker: Pt at this point HCPOA: yes  Ramond Marrow, son has legal HCPOA ( copy is on the chart ), son randy and daughter Larene Beach present for conversation  Code Status/Advance Care Planning:  DNR  Goal to go home with in home hospice  No pacemaker  Finish current coarse of treatment then dc home with hospice  Symptom Management:   Dyspnea: Much improved since admission. Continue 02. Discussed role of opioids in the management of dyspnea in setting of heart failure. Pt is opioid naive. Will order low dose oxyfast prn for dyspnea  Palliative Prophylaxis: Bowel regimin   Psycho-social/Spiritual:   Support System: yes  Desire for further Chaplaincy support:no  Prognosis: < 6  months  Discharge Planning:  Home with Hospice       Chief Complaint/History of Present Illness: Pt is a 81 yo female who presented with SHOB, bradycardia and abnormal EKG findings  Primary Diagnoses  Present on Admission:  . Complete heart block  Palliative Review of Systems: She denies dyspnea at rest, no chest pain, or n/v I have reviewed the medical record, interviewed the patient and family, and examined the patient. The following aspects are pertinent.  Past Medical History  Diagnosis Date  . CAD (coronary artery disease)     a. 07/2008 Inf STEMI->RCA BMS;  b. 09/2008 CABGx2: LIMA->LAD, RIMA->OM as Y from LIMA;  c. s/p DES to prox RCA 05/2014, severe OM lesion being treated medically.  . Ischemic cardiomyopathy     a. EF 15-20% in 08/2014, prev 35-40% range.  . Chronic systolic CHF (congestive heart failure)     a. 06/2011 EF 40%  . Hypertension   . Hyperlipidemia   . Hiatal hernia     large  . Tobacco abuse   . Cerebrovascular disease   . COPD (chronic obstructive pulmonary disease)     a. on home O2.  . Arterial disease     peripheral arterial disease with claudication - bilateral SFA disease with ABI's 0.4-0.5 bilaterally   . Mitral regurgitation     a. Severe MR, not felt to be a surgical candidate.  . CKD (chronic kidney disease), stage IV     born with 1 kidney  . Thyroid nodule 10/2012  left dominant nodule cytology:non-neoplastic goiter.   . Cataracts, both eyes     not surgical  . Personal history of colonic polyps - adenomas 05/09/2013  . Anemia   . Hypertensive renal disease   . Hypertensive heart disease with congestive heart failure   . Aorta aneurysm   . LVH (left ventricular hypertrophy)   . Bronchiectasis   . Atherosclerosis of abdominal aorta   . Chronic respiratory failure   . PAF (paroxysmal atrial fibrillation)     a. Dx 08/2014, not a candidate for anticoag due to anemia and need for DAPT - can reconsider once she comes off Plavix.    Social History   Social History  . Marital Status: Widowed    Spouse Name: N/A  . Number of Children: Y  . Years of Education: N/A   Occupational History  . 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    Social History Main Topics  . Smoking status: Former Smoker -- 1.00 packs/day for 60 years    Types: Cigarettes    Quit date: 07/12/2008  . Smokeless tobacco: Never Used  . Alcohol Use: No  . Drug Use: No  . Sexual Activity: No   Other Topics Concern  . None   Social History Narrative   She lives in [place]. She is retired and widowed.    Family History  Problem Relation Age of Onset  . Coronary artery disease Mother     extensive family history; several other family members.  . Heart disease Mother     CHF  . Coronary artery disease Brother   . Leukemia Sister   . Leukemia Sister   . Leukemia Father   . Colon cancer Neg Hx   . Pancreatic cancer Neg Hx   . Stomach cancer Neg Hx   . Rectal cancer Neg Hx   . Kidney cancer Brother   . Heart failure Mother   . Heart attack Sister   . Stroke Neg Hx   . Hypertension Neg Hx    Scheduled Meds: . aspirin EC  81 mg Oral Daily  . clopidogrel  75 mg Oral Q breakfast  . heparin  5,000 Units Subcutaneous 3 times per day  . sodium chloride  3 mL Intravenous Q12H   Continuous Infusions: . DOPamine Stopped (10/11/14 2104)   PRN Meds:.ondansetron (ZOFRAN) IV Medications Prior to Admission:  Prior to Admission medications   Medication Sig Start Date End Date Taking? Authorizing Provider  acetaminophen (TYLENOL) 325 MG tablet Take 650 mg by mouth daily as needed (pain).   Yes Historical Provider, MD  albuterol (PROVENTIL HFA;VENTOLIN HFA) 108 (90 BASE) MCG/ACT inhaler Inhale 2 puffs into the lungs every 4 (four) hours as needed for wheezing or shortness of breath.    Yes Historical Provider, MD  amiodarone (PACERONE) 200 MG tablet Take 1 tablet (200 mg total) by mouth daily. 09/13/14  Yes Dayna N Dunn, PA-C  aspirin  EC 81 MG tablet Take 81 mg by mouth daily.   Yes Historical Provider, MD  carvedilol (COREG) 3.125 MG tablet Take 1 tablet (3.125 mg total) by mouth 2 (two) times daily with a meal. 09/13/14  Yes Dayna N Dunn, PA-C  clopidogrel (PLAVIX) 75 MG tablet Take 1 tablet (75 mg total) by mouth daily with breakfast. 07/05/14  Yes Burnell Blanks, MD  Coenzyme Q-10 100 MG capsule Take 100 mg by mouth daily.    Yes Historical Provider, MD  Melatonin 5 MG TABS Take 5 mg by  mouth at bedtime as needed (sleep).   Yes Historical Provider, MD  nitroGLYCERIN (NITROSTAT) 0.4 MG SL tablet Place 1 tablet (0.4 mg total) under the tongue every 5 (five) minutes as needed. 04/13/14  Yes Burnell Blanks, MD  potassium chloride (K-DUR,KLOR-CON) 10 MEQ tablet Take 4 tablets (40 mEq total) by mouth daily. 10/08/14  Yes Larey Dresser, MD  pravastatin (PRAVACHOL) 80 MG tablet Take 80 mg by mouth daily.   Yes Historical Provider, MD  torsemide (DEMADEX) 20 MG tablet Take 4 tabs (80 mg) in the AM and take 2 tabs (40 mg) in the PM Patient taking differently: Take 20 mg by mouth See admin instructions. Take 4 tabs (80 mg) in the AM and take 2 tabs (40 mg) in the PM 10/08/14  Yes Larey Dresser, MD   No Known Allergies CBC:    Component Value Date/Time   WBC 7.3 10/13/2014 0312   HGB 7.7* 10/13/2014 0312   HCT 26.5* 10/13/2014 0312   PLT 392 10/13/2014 0312   MCV 78.2 10/13/2014 0312   NEUTROABS 4.9 05/22/2014 0409   LYMPHSABS 1.4 05/22/2014 0409   MONOABS 0.7 05/22/2014 0409   EOSABS 0.1 05/22/2014 0409   BASOSABS 0.0 05/22/2014 0409   Comprehensive Metabolic Panel:    Component Value Date/Time   NA 140 10/13/2014 0312   K 3.4* 10/13/2014 0312   CL 101 10/13/2014 0312   CO2 30 10/13/2014 0312   BUN 49* 10/13/2014 0312   CREATININE 3.27* 10/13/2014 0312   GLUCOSE 82 10/13/2014 0312   CALCIUM 8.4* 10/13/2014 0312   AST 149* 10/13/2014 0312   ALT 152* 10/13/2014 0312   ALKPHOS 70 10/13/2014 0312    BILITOT 0.9 10/13/2014 0312   PROT 4.7* 10/13/2014 0312   ALBUMIN 2.6* 10/13/2014 0312    Physical Exam: Vital Signs: BP 125/46 mmHg  Pulse 64  Temp(Src) 98.1 F (36.7 C) (Oral)  Resp 14  Ht $R'5\' 8"'Rw$  (1.727 m)  Wt 54.9 kg (121 lb 0.5 oz)  BMI 18.41 kg/m2  SpO2 99% SpO2: SpO2: 99 % O2 Device: O2 Device: Nasal Cannula O2 Flow Rate: O2 Flow Rate (L/min): 2 L/min Intake/output summary:  Intake/Output Summary (Last 24 hours) at 10/13/14 1204 Last data filed at 10/13/14 0930  Gross per 24 hour  Intake    180 ml  Output    925 ml  Net   -745 ml   LBM: Last BM Date: 10/12/14 Baseline Weight: Weight: 54.9 kg (121 lb 0.5 oz) Most recent weight: Weight: 54.9 kg (121 lb 0.5 oz)  Exam Findings:  General: Cachetic frail elderly female. She is alert and in no distress Resp: No work of breathing Cardiac: No edema Psych: A/O x4, affect congruent , thought processes linear and organized         Palliative Performance Scale: 50%              Additional Data Reviewed: Recent Labs     10/12/14  0307   10/12/14  1432  10/13/14  0312  WBC  12.1*   --    --   7.3  HGB  9.2*   --    --   7.7*  PLT  478*   --    --   392  NA  137   < >  138  140  BUN  49*   < >  50*  49*  CREATININE  3.52*   < >  3.44*  3.27*   < > =  values in this interval not displayed.     Time In: 1000 Time Out: 1130 Time Total: 90 min Greater than 50%  of this time was spent counseling and coordinating care related to the above assessment and plan.  Signed by: Dory Horn, NP  Dory Horn, NP  10/13/2014, 12:04 PM  Please contact Palliative Medicine Team phone at [phone] for questions and concerns.

## 2014-10-13 NOTE — Progress Notes (Signed)
Patient arrived in the unit via wheelchair with RN, Orientation given to the unit. Patient verbalizes understanding. Denies any pain or discomfort.

## 2014-10-14 LAB — BASIC METABOLIC PANEL
ANION GAP: 10 (ref 5–15)
BUN: 45 mg/dL — AB (ref 6–20)
CHLORIDE: 100 mmol/L — AB (ref 101–111)
CO2: 30 mmol/L (ref 22–32)
Calcium: 8.9 mg/dL (ref 8.9–10.3)
Creatinine, Ser: 2.79 mg/dL — ABNORMAL HIGH (ref 0.44–1.00)
GFR calc Af Amer: 17 mL/min — ABNORMAL LOW (ref >60)
GFR, EST NON AFRICAN AMERICAN: 14 mL/min — AB (ref >60)
Glucose, Bld: 116 mg/dL — ABNORMAL HIGH (ref 65–99)
POTASSIUM: 3.6 mmol/L (ref 3.5–5.1)
SODIUM: 140 mmol/L (ref 135–145)

## 2014-10-14 MED ORDER — OXYCODONE HCL 20 MG/ML PO CONC: 2.5000 mg | ORAL | Status: DC | PRN

## 2014-10-14 NOTE — Progress Notes (Signed)
Daily Progress Note   Patient Name: [name]       Date: 10/14/2014 DOB: Sep 29, 1927  Age: 81 y.o. MRN#: [id number] Attending Physician: Larey Dresser, MD Primary Care Physician: Leonard Downing, MD Admit Date: 10/11/2014  Reason for Consultation/Follow-up: Establishing goals of care, Non pain symptom management and Psychosocial/spiritual support  Subjective: Improving. Alert, no dyspnea. Moved out of ICU to floor.   Interval Events: Moved out of ICU Length of Stay: 3 days  Current Medications: Scheduled Meds:  . amiodarone  100 mg Oral Daily  . aspirin EC  81 mg Oral Daily  . carvedilol  3.125 mg Oral BID WC  . clopidogrel  75 mg Oral Q breakfast  . heparin  5,000 Units Subcutaneous 3 times per day  . sodium chloride  3 mL Intravenous Q12H    Continuous Infusions:    PRN Meds: ondansetron (ZOFRAN) IV, oxyCODONE  Palliative Performance Scale: 50%     Vital Signs: BP 122/55 mmHg  Pulse 69  Temp(Src) 97.7 F (36.5 C) (Oral)  Resp 18  Ht 5\' 8"  (1.727 m)  Wt 54.613 kg (120 lb 6.4 oz)  BMI 18.31 kg/m2  SpO2 98% SpO2: SpO2: 98 % O2 Device: O2 Device: Not Delivered O2 Flow Rate: O2 Flow Rate (L/min): 2 L/min  Intake/output summary:  Intake/Output Summary (Last 24 hours) at 10/14/14 1710 Last data filed at 10/14/14 1534  Gross per 24 hour  Intake    840 ml  Output    600 ml  Net    240 ml   LBM:   Baseline Weight: Weight: 54.9 kg (121 lb 0.5 oz) Most recent weight: Weight: 54.613 kg (120 lb 6.4 oz) (scale a)  Physical Exam: General: Frail eldelry female. A/O x3. No distress Resp: No work of breathing Pscyh: Affect congruent with mood. Laughing and speaking with family              Additional Data Reviewed: Recent Labs     10/12/14  0307   10/13/14  0312  10/14/14  0952  WBC  12.1*   --   7.3   --   HGB  9.2*   --   7.7*   --   PLT  478*   --   392   --   NA  137   < >  140  140  BUN  49*   < >  49*  45*  CREATININE  3.52*   < >   3.27*  2.79*   < > = values in this interval not displayed.     Problem List:  Patient Active Problem List   Diagnosis Date Noted  . Protein-calorie malnutrition, severe ([place]) 10/13/2014  . Chronic combined systolic and diastolic CHF (congestive heart failure) ([place])   . Palliative care encounter   . Complete heart block ([place]) 10/11/2014  . Hyperkalemia 10/11/2014  . Lactic acidosis 10/11/2014  . Acute on chronic renal failure ([place]) 10/11/2014  . CHF (congestive heart failure) ([place]) 09/26/2014  . Acute renal failure superimposed on stage 4 chronic kidney disease ([place])   . Acute on chronic systolic heart failure ([place])   . Anemia of chronic disease   . Paroxysmal atrial fibrillation ([place]) 08/08/2014  . Atrial fibrillation with RVR ([place])   . Acute respiratory failure with hypoxia ([place])   . Malnutrition of moderate degree ([place]) 05/30/2014  . Acute on chronic respiratory failure ([place]) 04/19/2014  . Ischemic cardiomyopathy   . CKD (chronic kidney  disease), stage IV ([place]) 07/14/2011  . COPD (chronic obstructive pulmonary disease) ([place]) 05/13/2011  . Pulmonary nodules 04/20/2011  . Chronic combined systolic and diastolic heart failure, NYHA class 2 ([place]) 11/29/2009  . Aortic valve disorder 07/26/2009  . PVD - bilat ICA 60-79% 06/12/2009  . CAD-native 04/22/2009  . Atherosclerosis of native arteries of extremity with intermittent claudication ([place]) 02/26/2009  . Hyperlipidemia   . Pleural effusion 11/28/2008  . History of thyroid nodule 08/22/2008  . Severe mitral regurgitation   . TOBACCO ABUSE 08/11/2008  . Essential hypertension 08/11/2008     Palliative Care Assessment & Plan    Code Status:  DNR  Goals of Care:  Return home with hospice after medical treatment completed    Symptom Management:  Dyspnea: Improving with diuresis. PRN oxycodone available for acute dyspnea. Pt is drug naive. 02 prn  Palliative Prophylaxis:  Bowel  prophylaxis  Psycho-social/Spiritual:  Desire for further Chaplaincy support:no   Prognosis: < 6 months Discharge Planning: Home with Hospice   Thank you for allowing the Palliative Medicine Team to assist in the care of this patient.   Time In: 1525 Time Out: 1550 Total Time 25 min Prolonged Time Billed  no     Greater than 50%  of this time was spent counseling and coordinating care related to the above assessment and plan.   Dory Horn, NP  10/14/2014, 5:10 PM  Please contact Palliative Medicine Team phone at [phone] for questions and concerns.

## 2014-10-14 NOTE — Evaluation (Signed)
Physical Therapy Evaluation Patient Details Name: CRISTEN BREDESON MRN: [id number] DOB: Oct 05, 1927 Today's Date: 10/14/2014   History of Present Illness  NANDINI BOGDANSKI is a 81 y.o. 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 Presents with SOB and brdycardia, which has resolved with normalization of K+  Clinical Impression   Pt admitted with above diagnosis. Pt currently with functional limitations due to the deficits listed below (see PT Problem List).  Pt will benefit from skilled PT to increase their independence and safety with mobility to allow discharge to the venue listed below.       Follow Up Recommendations Other (comment) (Will take Hospice's lead re: HHPT or no HHPT)    Equipment Recommendations  None recommended by PT    Recommendations for Other Services       Precautions / Restrictions Precautions Precautions: Other (comment) Precaution Comments: monitor O2 during mobility      Mobility  Bed Mobility Overal bed mobility: Modified Independent             General bed mobility comments: HOB elevated, pt pulling on railing to get to sitting.   Transfers Overall transfer level: Needs assistance Equipment used: Rolling walker (2 wheeled) Transfers: Sit to/from Stand Sit to Stand: Supervision         General transfer comment: Apprent smoother transition to stand than last session  Ambulation/Gait Ambulation/Gait assistance: Min guard (without physical contact) Ambulation Distance (Feet): 150 Feet Assistive device: Rolling walker (2 wheeled) Gait Pattern/deviations: Step-through pattern Gait velocity: decreased    General Gait Details: slow, shuffling gait, did walk a short distance in the room without an assistive devicce and pt was reaching with both hands for support on furniture.  I do recommend she use a RW full time until she gets stronger.  Pt agrees.   Stairs            Wheelchair Mobility    Modified [name] (Stroke Patients Only)       Balance     Sitting balance-Leahy Scale: Good       Standing balance-Leahy Scale: Fair                               Pertinent Vitals/Pain Pain Assessment: No/denies pain    Home Living Family/patient expects to be discharged to:: Private residence Living Arrangements: Alone Available Help at Discharge: Family;Available PRN/intermittently Type of Home: Mobile home Home Access: Stairs to enter Entrance Stairs-Rails: Right Entrance Stairs-Number of Steps: 2 Home Layout: One level Home Equipment: Cane - single point;Walker - 2 wheels;Wheelchair - manual (Oxygen)      Prior Function Level of Independence: Independent               Hand Dominance        Extremity/Trunk Assessment   Upper Extremity Assessment: Generalized weakness           Lower Extremity Assessment: Generalized weakness      Cervical / Trunk Assessment: Kyphotic  Communication   Communication: No difficulties  Cognition Arousal/Alertness: Awake/alert Behavior During Therapy: WFL for tasks assessed/performed Overall Cognitive Status:  Within Functional Limits for tasks assessed                      General Comments General comments (skin integrity, edema, etc.): Pt was on room air as session was initiated; Opted to walk on Room air and monitor O2 sats and add supplemental O2 if necessary; O2 sats ranged between 85 to 91% with amb on Room air; re-started supplemental O2 at 2 L and sats ranged 92-100%    Exercises        Assessment/Plan    PT Assessment Patient needs continued PT services  PT Diagnosis Difficulty  walking;Generalized weakness   PT Problem List Decreased strength;Decreased activity tolerance;Decreased mobility;Decreased balance;Decreased knowledge of use of DME;Cardiopulmonary status limiting activity  PT Treatment Interventions DME instruction;Stair training;Gait training;Functional mobility training;Therapeutic activities;Therapeutic exercise;Balance training;Neuromuscular re-education;Patient/family education   PT Goals (Current goals can be found in the Care Plan section) Acute Rehab PT Goals Patient Stated Goal: to go home and get to feeling stronger PT Goal Formulation: With patient Time For Goal Achievement: 10/28/14 Potential to Achieve Goals: Good    Frequency Min 3X/week   Barriers to discharge        Co-evaluation               End of Session Equipment Utilized During Treatment: Oxygen Activity Tolerance: Patient tolerated treatment well Patient left: in bed;with call bell/phone within reach;with family/visitor present Nurse Communication: Mobility status         Time: 1600 (tiems are approximate)-1625 PT Time Calculation (min) (ACUTE ONLY): 25 min   Charges:   PT Evaluation $Initial PT Evaluation Tier I: 1 Procedure PT Treatments $Gait Training: 8-22 mins   PT G Codes:        Quin Hoop 10/14/2014, 5:37 PM  Roney Marion, [place] Pager [phone] Office [phone]

## 2014-10-14 NOTE — Progress Notes (Signed)
Patient ID: [name], female   DOB: 08-Oct-1927, 81 y.o.   MRN: [id number]   SUBJECTIVE:   Bradycardia resolved with treatment of hyperkalemia. Feeling better. Labs not ordered for today.  Seen by Palliative Care. Now DNR. Wants home hospice after hospital treatment completed.   Scheduled Meds: . amiodarone  100 mg Oral Daily  . aspirin EC  81 mg Oral Daily  . carvedilol  3.125 mg Oral BID WC  . clopidogrel  75 mg Oral Q breakfast  . heparin  5,000 Units Subcutaneous 3 times per day  . sodium chloride  3 mL Intravenous Q12H     Filed Vitals:   10/13/14 1634 10/13/14 2036 10/14/14 0413 10/14/14 0810  BP: 126/57 99/54 99/43  114/56  Pulse: 68 62 65 80  Temp: 98.4 F (36.9 C) 97.4 F (36.3 C) 97.4 F (36.3 C)   TempSrc: Oral Oral Oral   Resp: 20 18 18    Height: 5\' 8"  (1.727 m)     Weight: 119 lb 14.9 oz (54.4 kg)  120 lb 6.4 oz (54.613 kg)   SpO2: 93% 98% 98%     Intake/Output Summary (Last 24 hours) at 10/14/14 0831 Last data filed at 10/14/14 0726  Gross per 24 hour  Intake    120 ml  Output    825 ml  Net   -705 ml    LABS: Basic Metabolic Panel:  Recent Labs  10/12/14 0307  10/12/14 1432 10/13/14 0312  NA 137  < > 138 140  K 6.2*  < > 4.2 3.4*  CL 100*  < > 100* 101  CO2 26  < > 28 30  GLUCOSE 117*  < > 124* 82  BUN 49*  < > 50* 49*  CREATININE 3.52*  < > 3.44* 3.27*  CALCIUM 9.8  < > 8.8* 8.4*  MG 2.5*  --   --   --   PHOS 5.5*  --   --   --   < > = values in this interval not displayed. Liver Function Tests:  Recent Labs  10/12/14 0307 10/13/14 0312  AST 442* 149*  ALT 262* 152*  ALKPHOS 90 70  BILITOT 0.9 0.9  PROT 5.4* 4.7*  ALBUMIN 3.1* 2.6*   No results for input(s): LIPASE, AMYLASE in the last 72 hours. CBC:  Recent Labs  10/12/14 0307 10/13/14 0312  WBC 12.1* 7.3  HGB 9.2* 7.7*  HCT 31.9* 26.5*  MCV 79.6 78.2  PLT 478* 392    PHYSICAL EXAM General: NAD Neck: JVP 7 cm, no thyromegaly or thyroid nodule.  Lungs:  Decreased breath sounds at bases. CV: Lateral PMI.  Heart regular S1/S2, no S3/S4, 3/6 HSM apex.  Trace edema.   Abdomen: Soft, nontender, no hepatosplenomegaly, no distention.  Neurologic: Alert and oriented x 3.  Psych: Normal affect. Extremities: No clubbing or cyanosis.   TELEMETRY: Reviewed telemetry pt in NSR   ASSESSMENT AND PLAN: 81 yo woman with ischemic cardiomyopathy/chronic systolic CHF, CAD, HTN, CKD and pAF who presented with SOB and was found to be bradycardic and ECG c/w with ventricular escape. She is on amiodarone (started in August), carvedilol and more importantly is hyperkalemic. 1. Bradycardia: Due to hyperkalemia Resolved. Will recheck bmet today Caution with coreg/ amio--> would discontinue if any additional AV block Pt and family are clear that they would like to avoid PPM  2. AKI on CKD: Cardiorenal syndrome.  Now improving. She has been on torsemide 60 qam/40 qpm.  Daughter thinks  she may not be taking her meds correctly. bmet today will be ordered Consider restarting low dose oral diuretics tomorrow  3. Acute/chronic systolic CHF: Ischemic cardiomyopathy, EF 30% with diffuse hypokinesis. On exam today, very mild volume overload - Will hold torsemide for now.   4. CAD: s/p CABG. DES to native RCA in 5/16. She is on ASA 81 and Plavix, Continue.  Hold statin given elevated LFTs.  5. Atrial fibrillation: Paroxysmal, had been on amiodarone. NSR today. She is a poor candidate for triple anticoagulant therapy.  Continue very low dose amiodarone for now  6. Mitral regurgitation: Severe by echo. Conservative management  7. Hyperkalemia: resolved> K low yesterday.  No labs today.  Will order BMET for today and tomorrow  8. DNR/DNI wants home hospice    PT to see  Thompson Grayer MD 10/14/2014

## 2014-10-15 ENCOUNTER — Encounter (HOSPITAL_COMMUNITY): Payer: Medicare Other

## 2014-10-15 LAB — BASIC METABOLIC PANEL
ANION GAP: 8 (ref 5–15)
BUN: 40 mg/dL — ABNORMAL HIGH (ref 6–20)
CALCIUM: 8.3 mg/dL — AB (ref 8.9–10.3)
CO2: 31 mmol/L (ref 22–32)
CREATININE: 2.61 mg/dL — AB (ref 0.44–1.00)
Chloride: 96 mmol/L — ABNORMAL LOW (ref 101–111)
GFR, EST AFRICAN AMERICAN: 18 mL/min — AB (ref >60)
GFR, EST NON AFRICAN AMERICAN: 16 mL/min — AB (ref >60)
Glucose, Bld: 101 mg/dL — ABNORMAL HIGH (ref 65–99)
Potassium: 3.4 mmol/L — ABNORMAL LOW (ref 3.5–5.1)
SODIUM: 135 mmol/L (ref 135–145)

## 2014-10-15 MED ORDER — TORSEMIDE 20 MG PO TABS
40.0000 mg | ORAL_TABLET | Freq: Two times a day (BID) | ORAL | Status: DC
  Administered 2014-10-15 – 2014-10-16 (×2): 40 mg via ORAL
  Filled 2014-10-15 (×2): qty 2

## 2014-10-15 MED ORDER — POTASSIUM CHLORIDE CRYS ER 20 MEQ PO TBCR
20.0000 meq | EXTENDED_RELEASE_TABLET | Freq: Once | ORAL | Status: AC
  Administered 2014-10-15: 20 meq via ORAL
  Filled 2014-10-15: qty 1

## 2014-10-15 MED ORDER — TORSEMIDE 20 MG PO TABS
40.0000 mg | ORAL_TABLET | Freq: Two times a day (BID) | ORAL | Status: DC
  Filled 2014-10-15: qty 2

## 2014-10-15 NOTE — Progress Notes (Signed)
Patient ID: [name], female   DOB: [birth year], 81 y.o.   MRN: [id number]   SUBJECTIVE:   Still having some bradycardia but feels OK overall this am. No CP, SOB.  No dizziness or lightheadedness.   I/Os relatively balance (only + 260 mL). Weight down 2 lbs.  Holding torsemide for ^ Cr. Renal function continues to improve. K remains 3.4  Seen by Palliative Care. Now DNR/DNI. Home with hospice after hospital treatment completed.   Scheduled Meds: . amiodarone  100 mg Oral Daily  . aspirin EC  81 mg Oral Daily  . carvedilol  3.125 mg Oral BID WC  . clopidogrel  75 mg Oral Q breakfast  . heparin  5,000 Units Subcutaneous 3 times per day  . sodium chloride  3 mL Intravenous Q12H   Continuous Infusions:   PRN Meds:.ondansetron (ZOFRAN) IV, oxyCODONE    Filed Vitals:   10/14/14 1236 10/14/14 1617 10/14/14 2139 10/15/14 0448  BP: 116/50 122/55 111/61 104/54  Pulse: 62 69 71 57  Temp: 98.3 F (36.8 C) 97.7 F (36.5 C) 98.4 F (36.9 C) 98.2 F (36.8 C)  TempSrc: Oral Oral Oral Oral  Resp: 20 18 18 17   Height:      Weight:    118 lb 6.4 oz (53.706 kg)  SpO2: 91% 98% 93% 94%    Intake/Output Summary (Last 24 hours) at 10/15/14 0728 Last data filed at 10/15/14 0208  Gross per 24 hour  Intake    960 ml  Output    500 ml  Net    460 ml    LABS: Basic Metabolic Panel:  Recent Labs  10/14/14 0952 10/15/14 0311  NA 140 135  K 3.6 3.4*  CL 100* 96*  CO2 30 31  GLUCOSE 116* 101*  BUN 45* 40*  CREATININE 2.79* 2.61*  CALCIUM 8.9 8.3*   Liver Function Tests:  Recent Labs  10/13/14 0312  AST 149*  ALT 152*  ALKPHOS 70  BILITOT 0.9  PROT 4.7*  ALBUMIN 2.6*   No results for input(s): LIPASE, AMYLASE in the last 72 hours. CBC:  Recent Labs  10/13/14 0312  WBC 7.3  HGB 7.7*  HCT 26.5*  MCV 78.2  PLT 392   Cardiac Enzymes: No results for input(s): CKTOTAL, CKMB, CKMBINDEX, TROPONINI in the last 72 hours. BNP: Invalid input(s):  POCBNP D-Dimer: No results for input(s): DDIMER in the last 72 hours. Hemoglobin A1C: No results for input(s): HGBA1C in the last 72 hours. Fasting Lipid Panel: No results for input(s): CHOL, HDL, LDLCALC, TRIG, CHOLHDL, LDLDIRECT in the last 72 hours. Thyroid Function Tests: No results for input(s): TSH, T4TOTAL, T3FREE, THYROIDAB in the last 72 hours.  Invalid input(s): FREET3 Anemia Panel: No results for input(s): VITAMINB12, FOLATE, FERRITIN, TIBC, IRON, RETICCTPCT in the last 72 hours.  RADIOLOGY: Dg Chest 2 View  09/25/2014   CLINICAL DATA:  Difficulty breathing and weakness since yesterday. Smoker.  EXAM: CHEST  2 VIEW  COMPARISON:  08/10/2014 and 08/05/2014 as well as 06/01/2014 and chest CT 06/05/2014  FINDINGS: Patient is rotated to the right. Median sternotomy wires unchanged. Lungs are adequately inflated and demonstrate mild stable bibasilar opacification likely small effusions with associated atelectasis/ scarring. Subtle prominence of the perihilar markings likely mild vascular congestion. Stable prominence of the right hilum. Stable moderate cardiomegaly. Evidence of moderate size hiatal hernia. Remainder of the exam is unchanged.  IMPRESSION: Stable moderate cardiomegaly with suggestion of minimal vascular congestion.  Chronic bibasilar opacification compatible with  small effusions and associated atelectasis/ scarring.  Moderate size hiatal hernia.   Electronically Signed   By: Marin Olp M.D.   On: 09/25/2014 23:52   Dg Chest Port 1 View  10/11/2014   CLINICAL DATA:  Shortness of breath for 1 day  EXAM: PORTABLE CHEST - 1 VIEW  COMPARISON:  09/25/2014  FINDINGS: Stable cardiomegaly. Postsurgical changes are again noted. A large hiatal hernia is again seen. Chronic atelectatic changes are noted in the bases bilaterally. No focal acute infiltrate is seen. No bony abnormality is noted.  IMPRESSION: No acute abnormality noted.   Electronically Signed   By: Inez Catalina M.D.   On:  10/11/2014 20:08    PHYSICAL EXAM General: NAD Neck: JVP 12 cm with HJR, no thyromegaly or thyroid nodule noted Lungs: Decreased bibasilarly CV: Lateral PMI.  Heart regular S1/S2, no S3/S4, 3/6 HSM apex. Trace LE edema.   Abdomen: Soft, NT, ND, no hepatosplenomegaly.  Neurologic: Alert and oriented x 3.  Psych: Normal affect. Extremities: No clubbing or cyanosis.   TELEMETRY: Reviewed telemetry pt in NSR/Sinus brady in 60/50s  ASSESSMENT AND PLAN: 81 yo woman with ischemic cardiomyopathy/chronic systolic CHF, CAD, HTN, CKD and pAF who presented with SOB and was found to be bradycardic and ECG c/w with ventricular escape. She is on amiodarone (started in August), carvedilol and more importantly is hyperkalemic.  1. Bradycardia: Likely due to hyperkalemia. Improved.  - K 3.4 - Low-dose carvedilol and amio restarted 10/13/14 - PPM: no indication and patient/family do not want 2. AKI on CKD: Cardiorenal syndrome.  Now improving. She has been on torsemide 60 qam/40 qpm.  Daughter thinks she may not be taking her meds correctly. - Continue to hold diuretics today. Will restart Torsemide 40 BID this afternoon.   3. Acute/chronic systolic CHF: Ischemic cardiomyopathy, EF 30% with diffuse hypokinesis.  - Continue to hold torsemide for now. Cr improving and fluid status OK 4. CAD: s/p CABG. DES to native RCA in 5/16. She is on ASA 81 and Plavix, Continue.   - Holding statin given elevated LFTs. 5. Atrial fibrillation: Paroxysmal - NSR today. - On amio. Poor candidate for triple AC therapy. 6. Mitral regurgitation: Severe by echo. Suspect infarct-related MR. She is not a candidate for open MV repair/replacement. Mitral valve clip could be an option but she prefers to avoid invasive management at this point, which I think is reasonable.  7. Hyperkalemia: resolved> K now low.  - Will continue to supp very gently with Cr >2 8. DNR/DNI.  - Home hospice on discharge  Will restart Torsemide  this evening.  Satira Mccallum Tillery PA-C 10/15/2014  Patient seen with PA, agree with the above note. She is doing much better.  No dyspnea.  She continues to be volume overloaded on exam.  - Restart torsemide 40 mg bid this evening.   She has been seen by hospice, will plan home with hospice care tomorrow if she continues to do well.   Loralie Champagne 10/15/2014

## 2014-10-15 NOTE — Care Management Note (Signed)
Case Management Note  Patient Details  Name: [name] MRN: [id number] Date of Birth: 10-05-1927  Subjective/Objective:        Admitted with [facility]            Action/Plan: Patient is to go home with hospice care, Hospice choice offered, patient chose [facility], Lattie Haw with [facility] called will see patient today; referral faxed as requested.   Expected Discharge Date:  Possibly 10/17/2014              Expected Discharge Plan:  [place]  In-House Referral:  Hospice / [place]  Choice offered to:  Patient DME Agency:  [facility] Agency:  [facility]  Status of Service:  In process, will continue to follow  Sherrilyn Rist [phone] 10/15/2014, 11:31 AM

## 2014-10-15 NOTE — Progress Notes (Signed)
Notified by Hassan Rowan [facility] of pt./family request for [facility] services at home after discharge.  Writer spoke with patient and her son at the bedside to initiate education related to hospice philosophy, services and team approach to care. Family voiced understanding of information provided. Per discussion plan is for discharge to home by personal vehicle when medically stable.  Please send signed and completed DNR form home with pt./family. Pt. will need PO prescriptions for discharge comfort medications.  DME needs discussed and pt. reports she has a cane, walker  O2 at home at 1 L but uses it prn. No other equipment needed per pt. [facility] equipment manager advised of above.  [facility]erral [facility] aware of above. Please notify [facility] when pt. Is ready to leave unit at discharge - call [phone] ( or [phone] if after 5 p). [facility] information and contact numbers have been given to pt. during visit.  Above information shared with Hassan Rowan, [facility]. Please call with any questions.   Barbra Sarks RN [facility] Liaison [phone]

## 2014-10-15 NOTE — Care Management Important Message (Signed)
Important Message  Patient Details  Name: [name] MRN: [id number] Date of Birth: 1927-12-31   Medicare Important Message Given:  Yes-second notification given    Delorse Lek 10/15/2014, 1:23 PM

## 2014-10-16 LAB — BASIC METABOLIC PANEL
ANION GAP: 8 (ref 5–15)
BUN: 36 mg/dL — ABNORMAL HIGH (ref 6–20)
CALCIUM: 8 mg/dL — AB (ref 8.9–10.3)
CHLORIDE: 96 mmol/L — AB (ref 101–111)
CO2: 33 mmol/L — AB (ref 22–32)
Creatinine, Ser: 2.35 mg/dL — ABNORMAL HIGH (ref 0.44–1.00)
GFR calc non Af Amer: 18 mL/min — ABNORMAL LOW (ref >60)
GFR, EST AFRICAN AMERICAN: 21 mL/min — AB (ref >60)
Glucose, Bld: 84 mg/dL (ref 65–99)
POTASSIUM: 3.6 mmol/L (ref 3.5–5.1)
Sodium: 137 mmol/L (ref 135–145)

## 2014-10-16 MED ORDER — TORSEMIDE 20 MG PO TABS: 40.0000 mg | ORAL_TABLET | Freq: Two times a day (BID) | ORAL | Status: DC

## 2014-10-16 MED ORDER — POTASSIUM CHLORIDE CRYS ER 10 MEQ PO TBCR
10.0000 meq | EXTENDED_RELEASE_TABLET | Freq: Two times a day (BID) | ORAL | Status: DC
  Administered 2014-10-16: 10 meq via ORAL
  Filled 2014-10-16: qty 1

## 2014-10-16 MED ORDER — POTASSIUM CHLORIDE CRYS ER 20 MEQ PO TBCR: 20.0000 meq | EXTENDED_RELEASE_TABLET | Freq: Two times a day (BID) | ORAL | Status: DC

## 2014-10-16 MED ORDER — CARVEDILOL 3.125 MG PO TABS: 3.1250 mg | ORAL_TABLET | Freq: Two times a day (BID) | ORAL | Status: AC

## 2014-10-16 MED ORDER — POTASSIUM CHLORIDE CRYS ER 10 MEQ PO TBCR: 10.0000 meq | EXTENDED_RELEASE_TABLET | Freq: Two times a day (BID) | ORAL | Status: DC

## 2014-10-16 MED ORDER — AMIODARONE HCL 200 MG PO TABS: 100.0000 mg | ORAL_TABLET | Freq: Every day | ORAL | Status: AC

## 2014-10-16 MED ORDER — CLOPIDOGREL BISULFATE 75 MG PO TABS: 75.0000 mg | ORAL_TABLET | Freq: Every day | ORAL | Status: AC

## 2014-10-16 NOTE — Discharge Summary (Signed)
Advanced Heart Failure Team  Discharge Summary   Patient ID: [name] MRN: [id number], DOB/AGE: 08/22/1927 81 y.o. Admit date: 10/11/2014 D/C date:     10/16/2014   Primary Discharge Diagnoses:  1. Bradycardia: Likely due to hyperkalemia. Improved, tolerating low dose coreg on discharge. No indication, or pt desire, for PPM. 2. AKI on CKD: Cardiorenal syndrome. Improved 3. Acute/chronic systolic CHF: Ischemic cardiomyopathy, EF 30% with diffuse hypokinesis.  4. CAD: s/p CABG.DES to native RCA in 5/16.On ASA 81 and Plavix,  Stopped statin with elevated LFTs. 5. Atrial fibrillation: Paroxysmal - NSR by EKG 10/16/14. On amio 100 mg daily. 6. Mitral regurgitation: Severe by echo. Suspect infarct-related MR. - Does not want any invasive management 7. Hyperkalemia: in setting of AKI. Correcting. On chronic gentle repletion with torsemide. 8. DNR/DNI- Home hospice decided on this admission.  Hospital Course:   [name] is a 81 y.o. female with ischemic cardiomyopathy/chronic systolic CHF, CAD, HTN, CKD and pAF who presented to [facility] with SOB and was found to be bradycardic and ECG c/w with ventricular escape. Coreg and amiodarone held.   Bradycardia was thought to be 2/2 hyperkalemia with initial K of 6.5 in setting of cardiorenal syndrome. She was given IV Ca, glucose and insulin in the ED with improvement, but became hyperkalemic again this admission in setting of AKI. Given kayexalate with over correction into hypokalemia that was treated with gentle supplementation as her kidney function improved. Potassium stable on day of discharge.   On arrival to the ED as her prognosis was discussed, she made her desire for code status known saying she would not want CPR, AED, or intubation, but would accept treatment up until that point.  She was seen by Palliative Care on 10/12/14 and pt and son, Lonny Prude who is HCPOA, decided on DNR.   In further discussions on 10/14/14, pt and  family decided she would go home with Hospice.  Her heart rate improved and she remained asymptomatic for the remainder of her admission.  Coreg and amiodarone were added back on without further symptoms. Overall she diuresed 2 L. Her weight remained stable this admission with several days of holding diuretics and resumed torsemide at 40 mg BID prior to discharge without further AKI. (previous home dose 80 mg am/ 40 mg pm)  She will be discharged home in stable condition with home hospice care.  She will have close follow up in the HF clinic as below.   Discharge Weight Range: 120 lb Discharge Vitals: Blood pressure 103/45, pulse 58, temperature 97.8 F (36.6 C), temperature source Oral, resp. rate 18, height 5\' 8"  (1.727 m), weight 120 lb 3.2 oz (54.522 kg), SpO2 92 %.  Labs: Lab Results  Component Value Date   WBC 7.3 10/13/2014   HGB 7.7* 10/13/2014   HCT 26.5* 10/13/2014   MCV 78.2 10/13/2014   PLT 392 10/13/2014    Recent Labs Lab 10/13/14 0312  10/16/14 0537  NA 140  < > 137  K 3.4*  < > 3.6  CL 101  < > 96*  CO2 30  < > 33*  BUN 49*  < > 36*  CREATININE 3.27*  < > 2.35*  CALCIUM 8.4*  < > 8.0*  PROT 4.7*  --   --   BILITOT 0.9  --   --   ALKPHOS 70  --   --   ALT 152*  --   --   AST 149*  --   --  GLUCOSE 82  < > 84  < > = values in this interval not displayed. Lab Results  Component Value Date   CHOL 153 08/04/2014   HDL 50 08/04/2014   LDLCALC 88 08/04/2014   TRIG 75 08/04/2014   BNP (last 3 results)  Recent Labs  09/25/14 2356 10/08/14 1544 10/11/14 1947  BNP 3390.2* 3708.7* 3611.8*    ProBNP (last 3 results) No results for input(s): PROBNP in the last 8760 hours.   Diagnostic Studies/Procedures   No results found.  Discharge Medications     Medication List    STOP taking these medications        Coenzyme Q-10 100 MG capsule     pravastatin 80 MG tablet  Commonly known as:  PRAVACHOL      TAKE these medications         acetaminophen 325 MG tablet  Commonly known as:  TYLENOL  Take 650 mg by mouth daily as needed (pain).     albuterol 108 (90 BASE) MCG/ACT inhaler  Commonly known as:  PROVENTIL HFA;VENTOLIN HFA  Inhale 2 puffs into the lungs every 4 (four) hours as needed for wheezing or shortness of breath.     amiodarone 200 MG tablet  Commonly known as:  PACERONE  Take 0.5 tablets (100 mg total) by mouth daily.     aspirin EC 81 MG tablet  Take 81 mg by mouth daily.     carvedilol 3.125 MG tablet  Commonly known as:  COREG  Take 1 tablet (3.125 mg total) by mouth 2 (two) times daily with a meal.     clopidogrel 75 MG tablet  Commonly known as:  PLAVIX  Take 1 tablet (75 mg total) by mouth daily with breakfast.     Melatonin 5 MG Tabs  Take 5 mg by mouth at bedtime as needed (sleep).     nitroGLYCERIN 0.4 MG SL tablet  Commonly known as:  NITROSTAT  Place 1 tablet (0.4 mg total) under the tongue every 5 (five) minutes as needed.     potassium chloride 10 MEQ tablet  Commonly known as:  K-DUR,KLOR-CON  Take 1 tablet (10 mEq total) by mouth 2 (two) times daily.     torsemide 20 MG tablet  Commonly known as:  DEMADEX  Take 2 tablets (40 mg total) by mouth 2 (two) times daily.        Disposition   The patient will be discharged in stable condition to home with hospice care. Discharge Instructions    Contraindication to ACEI at discharge    Complete by:  As directed      Diet - low sodium heart healthy    Complete by:  As directed      Heart Failure patients record your daily weight using the same scale at the same time of day    Complete by:  As directed      Increase activity slowly    Complete by:  As directed           Follow-up Information    Follow up with Leonard Downing, MD On 10/23/2014.   Specialty:  Family Medicine   Why:  @10 ;Max Sane information:   [place] [id number] [phone]       Follow up with Loralie Champagne, MD On  10/25/2014.   Specialty:  Cardiology   Why:  at 1020 am for post hospital follow up. Please bring all of your medications to your visit.  The code for patient parking is 0900.   Contact information:   [place] [address] [phone]         Duration of Discharge Encounter: Greater than 35 minutes   Signed, Shirley Friar PA-C 10/16/2014, 2:40 PM

## 2014-10-16 NOTE — Progress Notes (Signed)
[facility]  Patient Status: Active (receiving services up to time of hospitalization)  [facility] is providing the following services: RN  If patient discharges after hours, please call [phone].   [name] 10/16/2014, 1:56 PM

## 2014-10-16 NOTE — Progress Notes (Signed)
Physical Therapy Treatment Patient Details Name: [name] MRN: [id number] DOB: 1927-07-03 Today's Date: 10/16/2014    History of Present Illness [name] is a 81 y.o. 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 Presents with SOB and brdycardia, which has resolved with normalization of K+    PT Comments    [name] is very pleasant and moving well on RA at this time. She is aware of HEP to perform at home and encouraged to continue as well as walking program. Pt states understanding and stated fatigue end of session. Pt with increased gait distance but continue to recommend RW use at all times.   Follow Up Recommendations  Home health PT (pending hospice )     Equipment Recommendations  None recommended by PT    Recommendations for Other Services       Precautions / Restrictions Precautions Precautions: Fall Precaution Comments: watch O2    Mobility  Bed Mobility Overal bed mobility: Modified Independent                Transfers Overall transfer level: Modified independent                  Ambulation/Gait Ambulation/Gait assistance: Supervision Ambulation Distance (Feet): 300 Feet Assistive device: Rolling walker (2 wheeled) Gait Pattern/deviations: Step-through pattern;Decreased stride length   Gait velocity interpretation: Below normal speed for age/gender General Gait Details: cues for direction and to step into RW. Sats 90-95% on RA with gait today with HR 71-77   Stairs Stairs: Yes Stairs assistance: Modified independent (Device/Increase time) Stair Management: One rail Right;Alternating pattern;Forwards Number of Stairs: 2    Wheelchair Mobility    Modified [name] (Stroke Patients Only)        Balance Overall balance assessment: Needs assistance   Sitting balance-Leahy Scale: Good       Standing balance-Leahy Scale: Fair                      Cognition Arousal/Alertness: Awake/alert Behavior During Therapy: WFL for tasks assessed/performed Overall Cognitive Status: Within Functional Limits for tasks assessed                      Exercises General Exercises - Lower Extremity Long Arc Quad: AROM;Seated;Both;15 reps Hip ABduction/ADduction: AROM;Seated;Both;15 reps Hip Flexion/Marching: AROM;Seated;Both;15 reps Toe Raises: AROM;Seated;Both;15 reps    General Comments        Pertinent Vitals/Pain Pain Assessment: No/denies pain  HR 71-77 sats 90-95% on RA    Home Living                      Prior Function            PT Goals (current goals can now be found in the care plan section) Progress towards PT goals: Progressing toward goals    Frequency       PT Plan Current plan remains appropriate    Co-evaluation             End of Session Equipment Utilized During Treatment: Oxygen Activity Tolerance: Patient tolerated treatment well Patient left: in chair;with call bell/phone within [name];with chair alarm set;with family/visitor present     Time: 0488-8916 PT Time Calculation (min) (ACUTE ONLY): 20 min  Charges:  $Gait Training: 8-22 mins                    G Codes:      [name] 05-Nov-2014, 11:32 AM [name], [name]

## 2014-10-16 NOTE — Progress Notes (Signed)
Patient ID: LEEN TWOREK, female   DOB: May 19, 1927, 81 y.o.   MRN: [id number]   SUBJECTIVE:   No complaints this am.  Ready to go home.  Breathing OK. No CP, lightheadedness, dizziness.  Out 0.8 L but weight up 2 lbs back on torsemide 40 BID. Renal function continues to improve. K 3.6.  Seen by Palliative Care. Now DNR/DNI. Home with hospice on discharge.   Scheduled Meds: . amiodarone  100 mg Oral Daily  . aspirin EC  81 mg Oral Daily  . carvedilol  3.125 mg Oral BID WC  . clopidogrel  75 mg Oral Q breakfast  . heparin  5,000 Units Subcutaneous 3 times per day  . sodium chloride  3 mL Intravenous Q12H  . torsemide  40 mg Oral BID   Continuous Infusions:   PRN Meds:.ondansetron (ZOFRAN) IV, oxyCODONE    Filed Vitals:   10/15/14 1400 10/15/14 1710 10/15/14 2039 10/16/14 0414  BP: 110/52 118/66 94/52 108/44  Pulse: 66 73 63 62  Temp:   97.5 F (36.4 C) 97.3 F (36.3 C)  TempSrc:   Oral Oral  Resp:   18 18  Height:      Weight:    120 lb 3.2 oz (54.522 kg)  SpO2: 92%  94% 98%    Intake/Output Summary (Last 24 hours) at 10/16/14 0807 Last data filed at 10/16/14 8469  Gross per 24 hour  Intake    540 ml  Output   1750 ml  Net  -1210 ml    LABS: Basic Metabolic Panel:  Recent Labs  10/15/14 0311 10/16/14 0537  NA 135 137  K 3.4* 3.6  CL 96* 96*  CO2 31 33*  GLUCOSE 101* 84  BUN 40* 36*  CREATININE 2.61* 2.35*  CALCIUM 8.3* 8.0*   Liver Function Tests: No results for input(s): AST, ALT, ALKPHOS, BILITOT, PROT, ALBUMIN in the last 72 hours. No results for input(s): LIPASE, AMYLASE in the last 72 hours. CBC: No results for input(s): WBC, NEUTROABS, HGB, HCT, MCV, PLT in the last 72 hours. Cardiac Enzymes: No results for input(s): CKTOTAL, CKMB, CKMBINDEX, TROPONINI in the last 72 hours. BNP: Invalid input(s): POCBNP D-Dimer: No results for input(s): DDIMER in the last 72 hours. Hemoglobin A1C: No results for input(s): HGBA1C in the last 72  hours. Fasting Lipid Panel: No results for input(s): CHOL, HDL, LDLCALC, TRIG, CHOLHDL, LDLDIRECT in the last 72 hours. Thyroid Function Tests: No results for input(s): TSH, T4TOTAL, T3FREE, THYROIDAB in the last 72 hours.  Invalid input(s): FREET3 Anemia Panel: No results for input(s): VITAMINB12, FOLATE, FERRITIN, TIBC, IRON, RETICCTPCT in the last 72 hours.  RADIOLOGY: Dg Chest 2 View  09/25/2014   CLINICAL DATA:  Difficulty breathing and weakness since yesterday. Smoker.  EXAM: CHEST  2 VIEW  COMPARISON:  08/10/2014 and 08/05/2014 as well as 06/01/2014 and chest CT 06/05/2014  FINDINGS: Patient is rotated to the right. Median sternotomy wires unchanged. Lungs are adequately inflated and demonstrate mild stable bibasilar opacification likely small effusions with associated atelectasis/ scarring. Subtle prominence of the perihilar markings likely mild vascular congestion. Stable prominence of the right hilum. Stable moderate cardiomegaly. Evidence of moderate size hiatal hernia. Remainder of the exam is unchanged.  IMPRESSION: Stable moderate cardiomegaly with suggestion of minimal vascular congestion.  Chronic bibasilar opacification compatible with small effusions and associated atelectasis/ scarring.  Moderate size hiatal hernia.   Electronically Signed   By: Marin Olp M.D.   On: 09/25/2014 23:52   Dg  Chest Port 1 View  10/11/2014   CLINICAL DATA:  Shortness of breath for 1 day  EXAM: PORTABLE CHEST - 1 VIEW  COMPARISON:  09/25/2014  FINDINGS: Stable cardiomegaly. Postsurgical changes are again noted. A large hiatal hernia is again seen. Chronic atelectatic changes are noted in the bases bilaterally. No focal acute infiltrate is seen. No bony abnormality is noted.  IMPRESSION: No acute abnormality noted.   Electronically Signed   By: Inez Catalina M.D.   On: 10/11/2014 20:08    PHYSICAL EXAM General: NAD Neck: JVP 11-12 cm, no thyromegaly or thyroid nodule Lungs: Decreased  bibasilarly CV: Lateral PMI.  Heart regular S1/S2, no S3/S4, 3/6 HSM apex. Trace LE edema.   Abdomen: Soft, NT, ND, no hepatosplenomegaly.  Neurologic: Alert and oriented x 3.  Psych: Normal affect. Extremities: No clubbing or cyanosis. Numerous varicose veins  TELEMETRY: NSR/Sinus brady in 60/50s. 1st degree block.   ASSESSMENT AND PLAN: 81 yo woman with ischemic cardiomyopathy/chronic systolic CHF, CAD, HTN, CKD and pAF who presented with SOB and was found to be bradycardic and ECG c/w with ventricular escape. She was hyperkalemic with AKI.  She is on amiodarone (started in August) and Coreg.  1. Bradycardia: Likely due to hyperkalemia. Improved.  - K 3.6 - Low-dose carvedilol and amio restarted 10/13/14, can continue for now. - PPM: no indication and patient/family do not want 2. AKI on CKD: Cardiorenal syndrome.  Now improving.  - Continue torsemide 40 BID, lower dose. Cr continues to improve. 3. Acute/chronic systolic CHF: Ischemic cardiomyopathy, EF 30% with diffuse hypokinesis.  - Fluid status slightly up but now back on torsemide.  4. CAD: s/p CABG. DES to native RCA in 5/16. She is on ASA 81 and Plavix, Continue.   - Holding statin given elevated LFTs. 5. Atrial fibrillation: Paroxysmal - NSR today. - On amio. Poor candidate for triple AC therapy. 6. Mitral regurgitation: Severe by echo. Suspect infarct-related MR. - Does not want any invasive management at this point 7. Hyperkalemia: Due to AKI.  - Now hypokalemic.  - Will supp very gently with Cr >2. Will use 10 meq BID for now back on torsemide. 8. DNR/DNI.  - Home hospice on discharge  Dispo: Home today with hospice.  Satira Mccallum Tillery PA-C 10/16/2014   Advanced Heart Failure Team Pager [phone] (M-F; 7a - 4p)  Please contact [facility] for night-coverage after hours (4p -7a ) and weekends on [email or web address]  Patient seen with PA, agree with the above note.  She is doing much better.  Creatinine down  to around what I think is her baseline.  Some volume overload but denies dyspnea, torsemide restarted at lower dose.   I think that our ability to significantly improve her condition is limited.  I agree with a hospice/comfort measures approach which has been discussed extensively with patient and family.  They agree with this approach.  We will plan to send her home with home hospice care today.   She will followup in CHF clinic in 1-2 weeks.  Home meds; torsemide 40 mg bid, KCl 10 bid, Coreg 3.125 bid, amiodarone 100 mg daily, ASA 81 daily, Plavix 75 mg daily.   Loralie Champagne 10/16/2014 11:59 AM

## 2014-10-25 ENCOUNTER — Ambulatory Visit (HOSPITAL_COMMUNITY)
Admit: 2014-10-25 | Discharge: 2014-10-25 | Disposition: A | Discharge status: Home / Self Care | Source: Ambulatory Visit | Attending: Cardiology | Admitting: Cardiology

## 2014-10-25 VITALS — BP 84/54 | HR 56 | Ht 68.0 in | Wt 115.8 lb

## 2014-10-25 DIAGNOSIS — Z7982 Long term (current) use of aspirin: Secondary | ICD-10-CM | POA: Insufficient documentation

## 2014-10-25 DIAGNOSIS — I5022 Chronic systolic (congestive) heart failure: Secondary | ICD-10-CM | POA: Diagnosis not present

## 2014-10-25 DIAGNOSIS — I255 Ischemic cardiomyopathy: Secondary | ICD-10-CM | POA: Insufficient documentation

## 2014-10-25 DIAGNOSIS — Z66 Do not resuscitate: Secondary | ICD-10-CM | POA: Insufficient documentation

## 2014-10-25 DIAGNOSIS — Z951 Presence of aortocoronary bypass graft: Secondary | ICD-10-CM | POA: Diagnosis not present

## 2014-10-25 DIAGNOSIS — N184 Chronic kidney disease, stage 4 (severe): Secondary | ICD-10-CM | POA: Diagnosis not present

## 2014-10-25 DIAGNOSIS — I739 Peripheral vascular disease, unspecified: Secondary | ICD-10-CM | POA: Insufficient documentation

## 2014-10-25 DIAGNOSIS — E785 Hyperlipidemia, unspecified: Secondary | ICD-10-CM | POA: Insufficient documentation

## 2014-10-25 DIAGNOSIS — Z87891 Personal history of nicotine dependence: Secondary | ICD-10-CM | POA: Insufficient documentation

## 2014-10-25 DIAGNOSIS — Q600 Renal agenesis, unilateral: Secondary | ICD-10-CM | POA: Diagnosis not present

## 2014-10-25 DIAGNOSIS — I080 Rheumatic disorders of both mitral and aortic valves: Secondary | ICD-10-CM | POA: Diagnosis not present

## 2014-10-25 DIAGNOSIS — J449 Chronic obstructive pulmonary disease, unspecified: Secondary | ICD-10-CM | POA: Insufficient documentation

## 2014-10-25 DIAGNOSIS — I5042 Chronic combined systolic (congestive) and diastolic (congestive) heart failure: Secondary | ICD-10-CM | POA: Diagnosis not present

## 2014-10-25 DIAGNOSIS — I129 Hypertensive chronic kidney disease with stage 1 through stage 4 chronic kidney disease, or unspecified chronic kidney disease: Secondary | ICD-10-CM | POA: Diagnosis not present

## 2014-10-25 DIAGNOSIS — I2510 Atherosclerotic heart disease of native coronary artery without angina pectoris: Secondary | ICD-10-CM | POA: Insufficient documentation

## 2014-10-25 DIAGNOSIS — Z7902 Long term (current) use of antithrombotics/antiplatelets: Secondary | ICD-10-CM | POA: Insufficient documentation

## 2014-10-25 DIAGNOSIS — I480 Paroxysmal atrial fibrillation: Secondary | ICD-10-CM | POA: Insufficient documentation

## 2014-10-25 DIAGNOSIS — I340 Nonrheumatic mitral (valve) insufficiency: Secondary | ICD-10-CM | POA: Diagnosis not present

## 2014-10-25 LAB — BASIC METABOLIC PANEL
ANION GAP: 9 (ref 5–15)
BUN: 57 mg/dL — ABNORMAL HIGH (ref 6–20)
CALCIUM: 9.3 mg/dL (ref 8.9–10.3)
CO2: 30 mmol/L (ref 22–32)
CREATININE: 2.92 mg/dL — AB (ref 0.44–1.00)
Chloride: 102 mmol/L (ref 101–111)
GFR, EST AFRICAN AMERICAN: 16 mL/min — AB (ref >60)
GFR, EST NON AFRICAN AMERICAN: 14 mL/min — AB (ref >60)
Glucose, Bld: 107 mg/dL — ABNORMAL HIGH (ref 65–99)
Potassium: 4.7 mmol/L (ref 3.5–5.1)
SODIUM: 141 mmol/L (ref 135–145)

## 2014-10-25 LAB — BRAIN NATRIURETIC PEPTIDE: B NATRIURETIC PEPTIDE 5: 3743.6 pg/mL — AB (ref 0.0–100.0)

## 2014-10-25 MED ORDER — TORSEMIDE 20 MG PO TABS: 40.0000 mg | ORAL_TABLET | Freq: Two times a day (BID) | ORAL | Status: DC

## 2014-10-25 NOTE — Patient Instructions (Signed)
INCREASE Torsemide to 60 mg twice a day for 3 days when weight is 118 or greater, then resume 40 mg twice a day  Labs today  Your physician recommends that you schedule a follow-up appointment in: 2 weeks  Do the following things EVERYDAY: 1) Weigh yourself in the morning before breakfast. Write it down and keep it in a log. 2) Take your medicines as prescribed 3) Eat low salt foods-Limit salt (sodium) to 2000 mg per day.  4) Stay as active as you can everyday 5) Limit all fluids for the day to less than 2 liters 6)

## 2014-10-26 NOTE — Progress Notes (Signed)
Patient ID: [name], female   DOB: 1927/04/07, 81 y.o.   MRN: [id number]     Advanced Heart Failure Note Date:  10/26/2014  Patient ID:  [name], DOB 04-04-1927, MRN [id number] PCP:  Leonard Downing, MD  Cardiologist: Dr. Angelena Form HF Cardiologist: Dr Aundra Dubin  History of Present Illness:  [name] is a 81 y.o. 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   In 5/16, she had PCI with DES to [facility].   She was admitted for acute on chronic systolic CHF and chest pain 7/22-08/15/14. She has had multiple admissions for acute HF over the last several months.  Echo 08/04/14: EF 30%, diffuse HK, mild AS, mild AI, severe MR, severe LAE, mildly dilated RV/mildly reduced RV systolic function, mild RAE, mild-mod TR, PASP 35. She was initially diuresed with IV Lasix. She had a minimal bump in troponin to 0.04.   Cath showed severe MR and EF of 15-20%. Widely patent RCA stent, widely patent LIMA to LAD and man made Y graft to obtuse marginal #2.  Cath also showed severe OM1 stenosis with the risk of intervention outweighing the benefit. TEE was considered to further assess her MR, but was cancelled 2/2 new onset afib with RVR. She was started on amiodarone and converted to NSR. Given chronic anemia and need for DAPT with recent stents in 05/2014, she was not felt to be a candidate for anticoagulation at this time.  She remains on ASA 81 and Plavix. Post-cath she was noted to have AKI likely related to hypotension and CIN. Nephrology consulted and held antihypertensives and hydrated the patient.  D/c weight 129lb.   She was admitted in 10/16 with AKI,  hyperkalemia, and acute on chronic systolic CHF.  Hyperkalemia was treated and she ended up being diuresed some.  It appears that she had not been taking her medications correctly prior to admission.  She was made DNR/DNI and went home with hospice.    She returns today for post-hospital evaluation.  She is stable.  Legs fatigue easily.  She is breathing ok in the house but is short of breath walking to the car. BP is low today.  No lightheadedness.  She has been using oxygen at night.  Weight is down 9 lbs.   ECG (9/16): NSR, LBBB  Labs (7/16): HCT 29 Labs (8/16): K 4.6, creatinine 2.54 Labs (9/16): K 3.6, creatinine 2.17 Labs (10/16): K 4.7, BUN 57, creatinine 2.97, AST 149, ALT 152  PMH: 1. CAD:  - Inferior STEMI 7/10 with RCA BMS.  - 9/10 CABG x 2 with LIMA-LAD and RIMA-OM as Y graft from LIMA.   - 5/16 DES to proximal RCA.  - LHC (7/16) with EF 15-20%, severe MR, RCA stent patent, LIMA-LAD and Y graft to OM2 patent.  70% mLCx, 90% ostial OM1, 70% D1.  OM1 was only potential target, medically managed given no angina.  2. Chronic systolic CHF: Ischemic cardiomyopathy.  Echo (7/16) with EF 30%, diffuse hypokinesis, mild aortic stenosis, mild AI, severe MR, mildly dilated RV with mildly decreased systolic function, mild-moderate TR.   3. Mitral regurgitation: Severe, likely post-infarct MR.  4. Atrial fibrillation: Paroxysmal.  On amiodarone, not anticoagulated.  5. CKD: Has 1 functional kidney. CIN 7/16.  6. PAD: Bilateral SFA disease.  7. Anemia 8. LBBB 9. Mediastinal lymphadenopathy 10. Carotid stenosis: Carotid dopplers (4/16) with 60-79% bilateral ICA stenosis.  11. COPD: Currently not using oxygen.  12. HTN 13. Hyperlipidemia 14. Aortic stenosis: Mild on 7/16 echo.   Current Outpatient Prescriptions  Medication Sig Dispense Refill  . acetaminophen (TYLENOL) 325 MG tablet Take 650 mg by mouth daily as needed (pain).    Marland Kitchen albuterol (PROVENTIL HFA;VENTOLIN HFA) 108 (90 BASE)  MCG/ACT inhaler Inhale 2 puffs into the lungs every 4 (four) hours as needed for wheezing or shortness of breath.     Marland Kitchen amiodarone (PACERONE) 200 MG tablet Take 0.5 tablets (100 mg total) by mouth daily. 15 tablet 6  . aspirin EC 81 MG tablet Take 81 mg by mouth daily.    . carvedilol (COREG) 3.125 MG tablet Take 1 tablet (3.125 mg total) by mouth 2 (two) times daily with a meal. 60 tablet 6  . clopidogrel (PLAVIX) 75 MG tablet Take 1 tablet (75 mg total) by mouth daily with breakfast. 30 tablet 6  . Melatonin 5 MG TABS Take 5 mg by mouth at bedtime as needed (sleep).    . nitroGLYCERIN (NITROSTAT) 0.4 MG SL tablet Place 1 tablet (0.4 mg total) under the tongue every 5 (five) minutes as needed. 25 tablet 6  . potassium chloride (K-DUR,KLOR-CON) 10 MEQ tablet Take 1 tablet (10 mEq total) by mouth 2 (two) times daily. 60 tablet 6  . torsemide (DEMADEX) 20 MG tablet Take 2 tablets (40 mg total) by mouth 2 (two) times daily. Unless weight is 118 or greater then take 60 mg BID x 3 days, resume norma dose of 40 BID 120 tablet 6   No current facility-administered medications for this encounter.    Allergies:   Review of patient's allergies indicates no known allergies.   Social History:  The patient  reports that she quit smoking about 6 years ago. Her smoking use included Cigarettes. She has a 60 pack-year smoking history. She has never used smokeless tobacco. She reports that she does not drink alcohol or use illicit drugs.   Family History:  The patient's family history includes Coronary artery disease in her brother and mother; Heart attack in her sister; Heart disease in her mother; Heart failure in her mother; Kidney cancer in her brother; Leukemia in her father, sister, and sister. There is no history of Colon cancer, Pancreatic cancer, Stomach cancer, Rectal cancer, Stroke, or Hypertension.  ROS:  Please see the history of present illness.    All other systems are reviewed and otherwise  negative.   PHYSICAL EXAM:  VS:  BP 84/54 mmHg  Pulse 56  Ht 5\' 8"  (1.727 m)  Wt 115 lb 12.8 oz (52.527 kg)  BMI 17.61 kg/m2  SpO2 98%  BMI: Body mass index is 17.61 kg/(m^2).   General. Well nourished, well developed thin WF, NAD HEENT: normocephalic, atraumatic Neck: JVD 8-9 cm, carotid bruits or masses Cardiac:  normal S1, S2; RRR; 3/6 HSM at apex Lungs:  CTA bilaterally. Abd: soft, nontender, no hepatomegaly, + BS MS: no deformity or atrophy Ext: No cyanosis, clubbing. 1+ edema to knees bilaterally Skin: warm and dry, no rash. Lots of bruising Neuro:  moves all extremities spontaneously, no focal abnormalities noted, follows commands Psych: Normal affect  Wt Readings from Last 3 Encounters:  10/25/14 115 lb 12.8 oz (  52.527 kg)  10/16/14 120 lb 3.2 oz (54.522 kg)  10/08/14 123 lb 8 oz (56.019 kg)    ASSESSMENT AND PLAN: 1. Chronic systolic CHF: Ischemic cardiomyopathy, EF 30% with diffuse hypokinesis. Symptoms are better since most recent hospitalization.  She is mildly volume overloaded on exam today, but this is improved.   - Continue torsemide 40 mg bid.  If her weight reaches 118 on her home scale, she will take torsemide 60 mg bid x 3 days then back to torsemide 40 bid.  BMET/BNP today.  - Continue Coreg at low dose.  - No ACEI or spironolactone for now with elevated creatinine. No BP room for hydralazine/Imdur.  Elevated creatinine makes digoxin a difficult medication to use in this small woman.  2. CAD: s/p CABG.  DES to native RCA in 5/16.  She is on ASA 81 and Plavix, which she will continue for at least a year.  She is off statin now that she is getting hospice care.  3. Atrial fibrillation: Paroxysmal, on amiodarone.  NSR today.  She was deemed to be a poor candidate for triple anticoagulant therapy. Will need to follow LFTs and TSH with amiodarone => LFTs in hospital elevated in the setting of acute illness and volume overload.  I do not think that this was related to  amiodarone.  She will need regular eye exams while on amiodarone.  4. Mitral regurgitation: Severe by echo.  Suspect infarct-related MR.  She is not a candidate for open MV repair/replacement.  Mitral valve clip could be an option but she prefers to avoid invasive management at this point, which I think is reasonable.  5. PAD: Unable to palpate pedal pulses.  Feet described as numb and cold.  Would hold off on doppler evaluation as she would not want invasive treatment. 6. CKD: Will need to follow BMET closely.  Has a nephrologist.  7. Mrs Mccarn is now DNR/DNI and getting hospice care.  I will focus on keeping her volume controlled and keeping her comfortable.   Followup in 2 weeks.   Loralie Champagne 10/26/2014

## 2014-10-29 ENCOUNTER — Telehealth (HOSPITAL_COMMUNITY): Payer: Self-pay

## 2014-10-29 NOTE — Telephone Encounter (Signed)
Message left by home health nurse that patients blood pressure was 90/40 today and wants to know if we would like to adjust any of her medications. BP on last OV in August was 110/50.  No other information left on message.    Patient said that her blood pressure this afternoon was 115/66.  Has felt tired and washed out the last two days.  Not short of breath.

## 2014-10-30 NOTE — Telephone Encounter (Signed)
No changes, has tended to run low.

## 2014-11-06 NOTE — Telephone Encounter (Signed)
Noted by Dr. Aundra Dubin that no changes are necessary and BP has tended to run low.  No phone number left by Home Health nurse to call back

## 2014-11-09 ENCOUNTER — Encounter (HOSPITAL_COMMUNITY): Payer: Medicare Other

## 2015-02-20 ENCOUNTER — Telehealth: Payer: Self-pay | Admitting: Cardiology

## 2015-02-20 MED ORDER — POTASSIUM CHLORIDE CRYS ER 10 MEQ PO TBCR: 10.0000 meq | EXTENDED_RELEASE_TABLET | Freq: Two times a day (BID) | ORAL | Status: DC

## 2015-02-20 MED ORDER — TORSEMIDE 20 MG PO TABS: 60.0000 mg | ORAL_TABLET | Freq: Two times a day (BID) | ORAL | Status: AC

## 2015-02-20 MED ORDER — POTASSIUM CHLORIDE CRYS ER 20 MEQ PO TBCR: 20.0000 meq | EXTENDED_RELEASE_TABLET | Freq: Two times a day (BID) | ORAL | Status: DC

## 2015-02-20 NOTE — Telephone Encounter (Signed)
I spoke with [facility] nurse) and she said the pt's weight has increased from 111 lbs on 2/1 to 124 lbs today.  The pt's lungs are clear and pt has 2 + bilateral pitting edema in legs and feet.  Pt did not complain of SOB and per [facility] the pt seemed comfortable.  BP 100/60, pulse 58.      I spoke with Dr Aundra Dubin by phone and he gave orders to increase Demadex to 60mg  twice a day, increase potassium to 66mEq twice a day and repeat BMP only in 5-6 days. I spoke with Varney Biles and made her aware of new orders.

## 2015-02-20 NOTE — Telephone Encounter (Signed)
I received a call from [facility] nurse) and she spoke with the pt and made her aware of Dr Claris Gladden recommendations. The pt told Varney Biles that she is NOT going to make medication changes recommended at this time.      I called the pt and spoke with her about recommendations and she is uncomfortable with taking a higher dosage of potassium due to history of hyperkalemia.  The pt did agree to take higher dosage of torsemide and I made her aware that we will be following her labs closely.  The pt will increase her dietary potassium.  I spoke with Varney Biles and made her aware of my discussion with the pt.

## 2015-02-20 NOTE — Telephone Encounter (Signed)
[name] is calling because  [name] has had a weight gain on 02/01/ weight was 111 and tioday it is 12.4.   Thanks

## 2015-02-20 NOTE — Addendum Note (Signed)
Addended by: Barkley Boards on: 02/20/2015 05:00 PM   Modules accepted: Orders

## 2015-02-22 NOTE — Telephone Encounter (Signed)
This patient is followed in the [facility], will forward

## 2015-03-06 ENCOUNTER — Telehealth: Payer: Self-pay | Admitting: Cardiology

## 2015-03-06 ENCOUNTER — Encounter: Payer: Self-pay | Admitting: Cardiology

## 2015-03-06 NOTE — Telephone Encounter (Signed)
Pt had BMET on 03-04-15,wants to know what he wants to do about her Torsemide.

## 2015-03-06 NOTE — Telephone Encounter (Signed)
Spoke with Kisha from Hospice and informed her that we did not receive the labs from 2/20. Alda Berthold said that she will have those faxed over to [place] again. Kisha informed me that K+- 5.2, BUN- 40 and Creatinine- 1.99. Eston Esters that I will send this information to Dr. Aundra Dubin but he may want to see the full lab before making recommendations. Kisha verbalized understanding and was appreciative for call back.

## 2015-03-07 NOTE — Telephone Encounter (Signed)
Spoke with Kisha from Hospice and she states that pt is currently taking K+ 71mEq BID. Advised I will send information to Dr. Aundra Dubin and that she will get a call with any recommendations. Alda Berthold advised that she is off of work tomorrow so we will need to call the main office number [phone]. Will route message to Dr. Aundra Dubin and CHF nurses for follow up

## 2015-03-07 NOTE — Telephone Encounter (Signed)
Stop K, repeat BMET in 1 week.

## 2015-03-07 NOTE — Telephone Encounter (Signed)
Creatinine is stable but K is mildly elevated.  How much K is she taking?

## 2015-03-08 NOTE — Telephone Encounter (Signed)
Spoke w/Kisha Hospice Nurse and informed her that Dr. Aundra Dubin recommended that she stop K+ and repeat BMET in 1 week.  States she will draw blood and send results to our office.  She will also notify Ms. Calix to stop K+.

## 2015-03-19 ENCOUNTER — Encounter: Payer: Self-pay | Admitting: Cardiology

## 2015-03-19 ENCOUNTER — Telehealth: Payer: Self-pay | Admitting: Cardiology

## 2015-03-19 NOTE — Telephone Encounter (Signed)
Left message for Keisha to call back 

## 2015-03-19 NOTE — Telephone Encounter (Signed)
[name] is wanting to know if Dr. Aundra Dubin is wanting the same dosage Torsemide 60mg  twice a day and also do he wants her potassium to still be discontinue.    Thanks

## 2015-03-19 NOTE — Telephone Encounter (Signed)
Returning your call. °

## 2015-03-19 NOTE — Telephone Encounter (Signed)
Patient had repeat lab work. No lab work noted at this time. [name] will fax lab work to our office for Dr. Aundra Dubin to review. Call [phone] to speak with [facility].

## 2015-04-03 ENCOUNTER — Telehealth (HOSPITAL_COMMUNITY): Payer: Self-pay | Admitting: *Deleted

## 2015-04-03 ENCOUNTER — Other Ambulatory Visit (HOSPITAL_COMMUNITY): Payer: Self-pay | Admitting: *Deleted

## 2015-04-03 MED ORDER — METOLAZONE 2.5 MG PO TABS: ORAL_TABLET | ORAL | Status: DC

## 2015-04-03 MED ORDER — POTASSIUM CHLORIDE CRYS ER 20 MEQ PO TBCR: EXTENDED_RELEASE_TABLET | ORAL | Status: AC

## 2015-04-03 NOTE — Telephone Encounter (Signed)
Left VM for [facility] with Hospice.  Scripts sent to pharmacy.

## 2015-04-03 NOTE — Telephone Encounter (Signed)
Metolazone 2.5 mg po x 1 + KCl 20 x 1, take 30 minutes before am torsemide tomorrow and again on Saturday.  Then in the future, take metolazone 2.5 mg + KCl 20 once weekly on Thursdays.

## 2015-04-03 NOTE — Telephone Encounter (Signed)
Keisha with Hospice called pts weight is up last week her weight was 118lbs today her weight is 131.2 lbs she is taking Torsemide 60mg  bid (potassium was discontinued 3 weeks ago) please advise.   call back #[phone]

## 2015-04-03 NOTE — Telephone Encounter (Signed)
Keisha with Hospice called pts weight is up last week her weight was 118lbs today her weight is 131.2 lbs she is taking Torsemide 60mg  bid (potassium was discontinued 3 weeks ago) please advise.   call back #[phone]

## 2015-05-23 ENCOUNTER — Other Ambulatory Visit (HOSPITAL_COMMUNITY): Payer: Self-pay

## 2015-05-23 MED ORDER — METOLAZONE 2.5 MG PO TABS: ORAL_TABLET | ORAL | Status: DC

## 2015-06-26 ENCOUNTER — Telehealth (HOSPITAL_COMMUNITY): Payer: Self-pay | Admitting: *Deleted

## 2015-06-26 MED ORDER — METOLAZONE 2.5 MG PO TABS: 2.5000 mg | ORAL_TABLET | ORAL | Status: AC | PRN

## 2015-06-26 NOTE — Telephone Encounter (Signed)
Varney Biles, RN called to report pt's wt is down 5 lbs, to 116.2 since 5/31.  Pt seems weaker BP 90/32, HR 57.  Pt is taking Torsemide 60 mg BID and Met 2.5 mg every Sat.  Advised can stop weekly metolazone and change to prn only.  If wt continues to drop let us know.  [facility] aware and agreeable

## 2015-07-04 ENCOUNTER — Emergency Department (HOSPITAL_COMMUNITY)

## 2015-07-04 ENCOUNTER — Emergency Department (HOSPITAL_COMMUNITY)
Admission: EM | Admit: 2015-07-04 | Discharge: 2015-07-04 | Disposition: A | Discharge status: Home / Self Care | Attending: Emergency Medicine | Admitting: Emergency Medicine

## 2015-07-04 ENCOUNTER — Encounter (HOSPITAL_COMMUNITY): Payer: Self-pay | Admitting: Radiology

## 2015-07-04 DIAGNOSIS — S50311A Abrasion of right elbow, initial encounter: Secondary | ICD-10-CM | POA: Diagnosis not present

## 2015-07-04 DIAGNOSIS — I130 Hypertensive heart and chronic kidney disease with heart failure and stage 1 through stage 4 chronic kidney disease, or unspecified chronic kidney disease: Secondary | ICD-10-CM | POA: Insufficient documentation

## 2015-07-04 DIAGNOSIS — M79601 Pain in right arm: Secondary | ICD-10-CM | POA: Diagnosis present

## 2015-07-04 DIAGNOSIS — Y999 Unspecified external cause status: Secondary | ICD-10-CM | POA: Diagnosis not present

## 2015-07-04 DIAGNOSIS — I2510 Atherosclerotic heart disease of native coronary artery without angina pectoris: Secondary | ICD-10-CM | POA: Diagnosis not present

## 2015-07-04 DIAGNOSIS — W19XXXA Unspecified fall, initial encounter: Secondary | ICD-10-CM | POA: Diagnosis not present

## 2015-07-04 DIAGNOSIS — R51 Headache: Secondary | ICD-10-CM | POA: Insufficient documentation

## 2015-07-04 DIAGNOSIS — Y92009 Unspecified place in unspecified non-institutional (private) residence as the place of occurrence of the external cause: Secondary | ICD-10-CM | POA: Insufficient documentation

## 2015-07-04 DIAGNOSIS — J449 Chronic obstructive pulmonary disease, unspecified: Secondary | ICD-10-CM | POA: Insufficient documentation

## 2015-07-04 DIAGNOSIS — Z7982 Long term (current) use of aspirin: Secondary | ICD-10-CM | POA: Diagnosis not present

## 2015-07-04 DIAGNOSIS — Y939 Activity, unspecified: Secondary | ICD-10-CM | POA: Diagnosis not present

## 2015-07-04 DIAGNOSIS — Z951 Presence of aortocoronary bypass graft: Secondary | ICD-10-CM | POA: Insufficient documentation

## 2015-07-04 DIAGNOSIS — Z79899 Other long term (current) drug therapy: Secondary | ICD-10-CM | POA: Insufficient documentation

## 2015-07-04 DIAGNOSIS — I480 Paroxysmal atrial fibrillation: Secondary | ICD-10-CM | POA: Insufficient documentation

## 2015-07-04 DIAGNOSIS — Z87891 Personal history of nicotine dependence: Secondary | ICD-10-CM | POA: Diagnosis not present

## 2015-07-04 DIAGNOSIS — E785 Hyperlipidemia, unspecified: Secondary | ICD-10-CM | POA: Diagnosis not present

## 2015-07-04 DIAGNOSIS — N189 Chronic kidney disease, unspecified: Secondary | ICD-10-CM | POA: Insufficient documentation

## 2015-07-04 DIAGNOSIS — I5022 Chronic systolic (congestive) heart failure: Secondary | ICD-10-CM | POA: Insufficient documentation

## 2015-07-04 MED ORDER — SODIUM CHLORIDE 0.9 % IV BOLUS (SEPSIS)
1000.0000 mL | Freq: Once | INTRAVENOUS | Status: AC
  Administered 2015-07-04: 1000 mL via INTRAVENOUS

## 2015-07-04 NOTE — ED Notes (Signed)
Per EMS, patient fell at home (unwitnessed).  She denied any LOC, dizziness, hitting head, or n/v.  Patient is on blood thinners.  Patient reports generalized weakness.   EMS reported BP to be low but Hospice nurse stated it was normal for this patient.   BP:98/58 P:80 R:14  CBG:135

## 2015-07-04 NOTE — ED Notes (Signed)
Bed: [id number] Expected date:  Expected time:  Means of arrival:  Comments: EMS [phone] Fall

## 2015-07-04 NOTE — ED Notes (Signed)
Pt. Unable to ambulate at this time. Pt. Stated she only can walk from her bed to the kitchen. Nurse aware.

## 2015-07-04 NOTE — ED Provider Notes (Signed)
[name] is a 82 y.o. female    Face-to-face evaluation   History: Fall this morning. She states that she was off balance and she leaned over to pick something up. She injured her buttocks when she fell. She has not walked yet.  Physical exam: Alert, elderly female who is calm and comfortable. Head is atraumatic. Arms and legs, normal range of motion, bilaterally. She is lucid.  Medical screening examination/treatment/procedure(s) were conducted as a shared visit with non-physician practitioner(s) and myself.  I personally evaluated the patient during the encounter  Daleen Bo, MD 07/06/15 [phone]

## 2015-07-04 NOTE — ED Provider Notes (Signed)
CSN: [id number]     Arrival date & time 07/04/15  [id number] History   First MD Initiated Contact with Patient 07/04/15 0935     Chief Complaint  Patient presents with  . Fall   HPI Comments: 82 year old female with multiple medical problems presents with a mechanical fall. She is a hospice patient. She lives alone however she does have help from her family and home health nurses. She states she was in the bathroom today and leaned forward lost her balance and fell onto her back. She is reporting some mild right ankle pain, right hip pain, and right arm pain where she has a skin tear. Denies fever, dizziness, headache, loss of consciousness, vision changes, chest pain, shortness of breath, abdominal pain, back pain, neck pain, dysuria, generalized weakness. She is currently on Plavix. Normally walks with a walker  Patient is a 82 y.o. female presenting with fall.  Fall Associated symptoms include arthralgias. Pertinent negatives include no abdominal pain, chest pain, fever, neck pain or weakness.    Past Medical History  Diagnosis Date  . CAD (coronary artery disease)     a. 07/2008 Inf STEMI->RCA BMS;  b. 09/2008 CABGx2: LIMA->LAD, RIMA->OM as Y from LIMA;  c. s/p DES to prox RCA 05/2014, severe OM lesion being treated medically.  . Ischemic cardiomyopathy     a. EF 15-20% in 08/2014, prev 35-40% range.  . Chronic systolic CHF (congestive heart failure) ([place])     a. 06/2011 EF 40%  . Hypertension   . Hyperlipidemia   . Hiatal hernia     large  . Tobacco abuse   . Cerebrovascular disease   . COPD (chronic obstructive pulmonary disease) ([place])     a. on home O2.  . Arterial disease ([place])     peripheral arterial disease with claudication - bilateral SFA disease with ABI's 0.4-0.5 bilaterally   . Mitral regurgitation     a. Severe MR, not felt to be a surgical candidate.  . CKD (chronic kidney disease), stage IV ([place])     born with 1 kidney  . Thyroid nodule 10/2012    left dominant nodule  cytology:non-neoplastic goiter.   . Cataracts, both eyes     not surgical  . Personal history of colonic polyps - adenomas 05/09/2013  . Anemia   . Hypertensive renal disease   . Hypertensive heart disease with congestive heart failure ([place])   . Aorta aneurysm ([place])   . LVH (left ventricular hypertrophy)   . Bronchiectasis ([place])   . Atherosclerosis of abdominal aorta ([place])   . Chronic respiratory failure ([place])   . PAF (paroxysmal atrial fibrillation) ([place])     a. Dx 08/2014, not a candidate for anticoag due to anemia and need for DAPT - can reconsider once she comes off Plavix.   Past Surgical History  Procedure Laterality Date  . Laprotomy for bowel obstruction    . Coronary artery bypass graft  09/2008    aortic thoraic anuerysm resecton 2010.   Marland Kitchen Coronary stent placement  07/2008    BMS to RCA  . Flexible sigmoidoscopy N/A 05/09/2013    Procedure: FLEXIBLE SIGMOIDOSCOPY;  Surgeon: [name], MD;  Location: [facility];  Service: Endoscopy;  Laterality: N/A;  with possible hemorrhoid banding use gastrosope unsdeated  . Appendectomy    . Oophorectomy      one removed in 70's  . Hemorrhoid surgery    . Colonoscopy N/A 05/12/2013    Procedure: COLONOSCOPY;  Surgeon: Ofilia Neas  Carlean Purl, MD;  Location: [facility];  Service: Endoscopy;  Laterality: N/A;  . Cardiac catheterization N/A 05/30/2014    Procedure: Right/Left Heart Cath and Coronary/Graft Angiography;  Surgeon: Peter M Martinique, MD;  Location: [facility];  Service: Cardiovascular;  Laterality: N/A;  . Cardiac catheterization N/A 06/04/2014    Procedure: Coronary Stent Intervention;  Surgeon: Belva Crome, MD;  Location: [facility];  Service: Cardiovascular;  Laterality: N/A;  . Cardiac catheterization N/A 08/06/2014    Procedure: Left Heart Cath and Coronary Angiography;  Surgeon: Belva Crome, MD;  Location: [facility];  Service: Cardiovascular;  Laterality: N/A;   Family History  Problem Relation Age  of Onset  . Coronary artery disease Mother     extensive family history; several other family members.  . Heart disease Mother     CHF  . Coronary artery disease Brother   . Leukemia Sister   . Leukemia Sister   . Leukemia Father   . Colon cancer Neg Hx   . Pancreatic cancer Neg Hx   . Stomach cancer Neg Hx   . Rectal cancer Neg Hx   . Kidney cancer Brother   . Heart failure Mother   . Heart attack Sister   . Stroke Neg Hx   . Hypertension Neg Hx    Social History  Substance Use Topics  . Smoking status: Former Smoker -- 1.00 packs/day for 60 years    Types: Cigarettes    Quit date: 07/12/2008  . Smokeless tobacco: Never Used  . Alcohol Use: No   OB History    No data available     Review of Systems  Constitutional: Negative for fever.  Respiratory: Negative for shortness of breath.   Cardiovascular: Negative for chest pain.  Gastrointestinal: Negative for abdominal pain.  Genitourinary: Negative for dysuria.  Musculoskeletal: Positive for arthralgias. Negative for back pain and neck pain.  Neurological: Negative for weakness.  All other systems reviewed and are negative.     Allergies  Review of patient's allergies indicates no known allergies.  Home Medications   Prior to Admission medications   Medication Sig Start Date End Date Taking? Authorizing Provider  acetaminophen (TYLENOL) 500 MG tablet Take 500 mg by mouth every 6 (six) hours as needed for moderate pain or headache.   Yes Historical Provider, MD  albuterol (PROVENTIL HFA;VENTOLIN HFA) 108 (90 BASE) MCG/ACT inhaler Inhale 2 puffs into the lungs every 4 (four) hours as needed for wheezing or shortness of breath.    Yes Historical Provider, MD  amiodarone (PACERONE) 200 MG tablet Take 0.5 tablets (100 mg total) by mouth daily. 10/16/14  Yes Shirley Friar, PA-C  aspirin EC 81 MG tablet Take 81 mg by mouth daily.   Yes Historical Provider, MD  carvedilol (COREG) 3.125 MG tablet Take 1 tablet  (3.125 mg total) by mouth 2 (two) times daily with a meal. 10/16/14  Yes Shirley Friar, PA-C  clopidogrel (PLAVIX) 75 MG tablet Take 1 tablet (75 mg total) by mouth daily with breakfast. 10/16/14  Yes Shirley Friar, PA-C  HYDROcodone-acetaminophen (NORCO/VICODIN) 5-325 MG tablet Take 1 tablet by mouth every 6 (six) hours as needed for moderate pain.   Yes Historical Provider, MD  ipratropium-albuterol (DUONEB) 0.5-2.5 (3) MG/3ML SOLN Take 3 mLs by nebulization every 6 (six) hours as needed (shortness of breath/wheezing).   Yes Historical Provider, MD  LORazepam (ATIVAN) 0.5 MG tablet Take 0.5 mg by mouth every 6 (six)  hours as needed for anxiety.   Yes Historical Provider, MD  Melatonin 5 MG TABS Take 5 mg by mouth at bedtime as needed (sleep).   Yes Historical Provider, MD  nitroGLYCERIN (NITROSTAT) 0.4 MG SL tablet Place 1 tablet (0.4 mg total) under the tongue every 5 (five) minutes as needed. Patient taking differently: Place 0.4 mg under the tongue every 5 (five) minutes as needed for chest pain.  04/13/14  Yes Burnell Blanks, MD  polyethylene glycol (MIRALAX / [place]) packet Take 17 g by mouth 2 (two) times daily as needed for moderate constipation.   Yes Historical Provider, MD  potassium chloride SA (K-DUR,KLOR-CON) 20 MEQ tablet Take 1 tablet (20 meq) every Thursday with Metolazone. Patient taking differently: Take 20 mEq by mouth once a week. Take 1 tablet (20 meq) once every Thursday. 04/03/15  Yes Larey Dresser, MD  sennosides-docusate sodium (SENOKOT-S) 8.6-50 MG tablet Take 2 tablets by mouth daily.   Yes Historical Provider, MD  temazepam (RESTORIL) 15 MG capsule Take 15 mg by mouth at bedtime.   Yes Historical Provider, MD  torsemide (DEMADEX) 20 MG tablet Take 3 tablets (60 mg total) by mouth 2 (two) times daily. Patient taking differently: Take 60 mg by mouth daily.  02/20/15  Yes Larey Dresser, MD  metolazone (ZAROXOLYN) 2.5 MG tablet Take 1 tablet (2.5 mg  total) by mouth as needed. Take 1 tablet (2.5mg ) every Thursday 30 min before taking Torsemide. Patient not taking: Reported on 07/04/2015 06/26/15   Larey Dresser, MD   BP 119/59 mmHg  Pulse 68  Temp(Src) 98.6 F (37 C) (Oral)  Resp 16  SpO2 100%   Physical Exam  Constitutional: She is oriented to person, place, and time. She appears well-developed and well-nourished. No distress.  Elderly female, NAD  HENT:  Head: Normocephalic and atraumatic. Head is without raccoon's eyes and without Battle's sign.  Right Ear: Hearing, external ear and ear canal normal.  Left Ear: Hearing, external ear and ear canal normal.  Nose: No epistaxis.  Mouth/Throat: Uvula is midline, oropharynx is clear and moist and mucous membranes are normal.  Eyes: Conjunctivae are normal. Pupils are equal, round, and reactive to light. Right eye exhibits no discharge. Left eye exhibits no discharge. No scleral icterus.  Neck: Normal range of motion.  No mid-line C spine tenderness  Cardiovascular: Normal rate and regular rhythm.  Exam reveals no gallop and no friction rub.   Murmur heard. Pulmonary/Chest: Effort normal and breath sounds normal. No respiratory distress. She has no wheezes. She has no rales. She exhibits no tenderness.  Abdominal: Soft. She exhibits no distension. There is no tenderness.  Musculoskeletal:  No midline thoracic or lumbar spinal tenderness.  R arm: No obvious swelling or deformity. No tenderness to palpation. FROM. N/V intact. R hip: No obvious swelling or deformity. No tenderness to palpation. FROM.  R ankle: No obvious swelling or deformity. Mild diffuse tenderness to palpation. FROM. N/V intact.     Neurological: She is alert and oriented to person, place, and time.  Slow, shuffling, unsteady gait.  Skin: Skin is warm and dry. She is not diaphoretic.  Psychiatric: She has a normal mood and affect. Her behavior is normal.    ED Course  Procedures (including critical care  time) Labs Review Labs Reviewed - No data to display  Imaging Review Dg Lumbar Spine Complete  07/04/2015  CLINICAL DATA:  Fall.  Low back pain. EXAM: LUMBAR SPINE - COMPLETE 4+ VIEW COMPARISON:  None. FINDINGS: This report assumes 5 non rib-bearing lumbar vertebrae. Mild-to-moderate rotatory dextroscoliosis of the thoracolumbar spine. Lumbar vertebral body heights are preserved, with no fracture. Moderate degenerative disc disease throughout the lumbar spine. No spondylolisthesis. Mild facet arthropathy bilaterally in the lower lumbar spine. No aggressive appearing focal osseous lesions. Prominent abdominal aortic atherosclerosis. IMPRESSION: 1. No lumbar fracture or spondylolisthesis. 2. Mild-to-moderate rotatory dextroscoliosis of the thoracolumbar spine. 3. Moderate degenerative disc disease throughout the lumbar spine. 4. Aortic atherosclerosis. Electronically Signed   By: Ilona Sorrel M.D.   On: 07/04/2015 11:54   Dg Ankle Complete Right  07/04/2015  CLINICAL DATA:  Right ankle pain following fall at home EXAM: RIGHT ANKLE - COMPLETE 3+ VIEW COMPARISON:  Right tibia and fibula series which included a portion of the ankle dated February 16, 2008 FINDINGS: The bones are subjectively adequately mineralized. There is a stable bone island in the distal tibial metaphysis. The ankle joint mortise is preserved. The talar dome is intact. There is no acute malleolar fracture. The talus and calcaneus exhibit no acute abnormalities. The metatarsal bases are intact where visualized. There is no significant soft tissue swelling. IMPRESSION: There is no acute or significant chronic bony abnormality of the right ankle. Electronically Signed   By: David  Martinique M.D.   On: 07/04/2015 11:53   Ct Head Wo Contrast  07/04/2015  CLINICAL DATA:  Status post fall today. The patient is anticoagulated. Initial encounter. EXAM: CT HEAD WITHOUT CONTRAST CT CERVICAL SPINE WITHOUT CONTRAST TECHNIQUE: Multidetector CT imaging of  the head and cervical spine was performed following the standard protocol without intravenous contrast. Multiplanar CT image reconstructions of the cervical spine were also generated. COMPARISON:  Head CT scan 09/12/2013. FINDINGS: CT HEAD FINDINGS Extensive chronic microvascular ischemic change is identified. There is cortical atrophy. No evidence of acute intracranial abnormality including hemorrhage, infarct, mass lesion, mass effect, midline shift or abnormal extra-axial fluid collection is identified. No hydrocephalus or pneumocephalus. The calvarium is intact. Mucosal thickening right sphenoid right maxillary sinus is noted. CT CERVICAL SPINE FINDINGS No cervical spine fracture or malalignment is identified. Multilevel loss of disc space height is seen. Lower cervical facet arthropathy is noted. Lung apices are clear. Enlargement of the left lobe of the thyroid without focal lesion is noted. IMPRESSION: No acute abnormality head or cervical spine. Atrophy and extensive chronic microvascular ischemic change. Cervical spondylosis. Electronically Signed   By: Inge Rise M.D.   On: 07/04/2015 11:17   Ct Cervical Spine Wo Contrast  07/04/2015  CLINICAL DATA:  Status post fall today. The patient is anticoagulated. Initial encounter. EXAM: CT HEAD WITHOUT CONTRAST CT CERVICAL SPINE WITHOUT CONTRAST TECHNIQUE: Multidetector CT imaging of the head and cervical spine was performed following the standard protocol without intravenous contrast. Multiplanar CT image reconstructions of the cervical spine were also generated. COMPARISON:  Head CT scan 09/12/2013. FINDINGS: CT HEAD FINDINGS Extensive chronic microvascular ischemic change is identified. There is cortical atrophy. No evidence of acute intracranial abnormality including hemorrhage, infarct, mass lesion, mass effect, midline shift or abnormal extra-axial fluid collection is identified. No hydrocephalus or pneumocephalus. The calvarium is intact. Mucosal  thickening right sphenoid right maxillary sinus is noted. CT CERVICAL SPINE FINDINGS No cervical spine fracture or malalignment is identified. Multilevel loss of disc space height is seen. Lower cervical facet arthropathy is noted. Lung apices are clear. Enlargement of the left lobe of the thyroid without focal lesion is noted. IMPRESSION: No acute abnormality head or cervical spine. Atrophy and extensive  chronic microvascular ischemic change. Cervical spondylosis. Electronically Signed   By: Inge Rise M.D.   On: 07/04/2015 11:17   Dg Humerus Right  07/04/2015  CLINICAL DATA:  Right humerus pain with small abrasion near elbow today after fall in home. Pt states she was reaching down to get something and fell back onto her bottom and right side. EXAM: RIGHT HUMERUS - 2+ VIEW COMPARISON:  None. FINDINGS: There is no evidence of fracture or other focal bone lesions. Soft tissues are unremarkable. IMPRESSION: Negative. Electronically Signed   By: Nolon Nations M.D.   On: 07/04/2015 11:52   I have personally reviewed and evaluated these images and lab results as part of my medical decision-making.   EKG Interpretation None      MDM   Final diagnoses:  Fall, initial encounter   82 year old female presents with a fall. CT and Xrays are negative for any acute findings and patient is complaining of only mild pain. Gait evaluation revealed a slow, shuffling, unsteady gait but patient is able to bear weight without difficulty. Had discussion with family on having more oversight of patient in the future which they agree however patient does not want to go to an ALF. Also talked about having discussion with PCP about her medicines as they state that she is on several sleeping medications which they feel adds to her unsteadiness. Shared visit with Dr. Eulis Foster. Patient is NAD, non-toxic, with stable VS and appears safe for d/c. Patient is informed of clinical course, understands medical decision making  process, and agrees with plan. Opportunity for questions provided and all questions answered. Return precautions given.      Recardo Evangelist, PA-C 07/05/15 1826  Daleen Bo, MD 07/06/15 [phone]

## 2015-07-04 NOTE — ED Notes (Signed)
Pt is from home with grandson, but he was at work.  States she got up during the night with walker to the bathroom.  When she reached down to get something that fell, it caused her to fall back onto her butt.  Denies hitting her head or LOC.  She called her daughter and was brought by EMS for eval.  Pt denies pain.  Daughter states she's on blood thinners and wanted to have her evaluated.  Pt is AxOx4 after reorientation to time.  Pt is on hospice care.

## 2015-10-22 ENCOUNTER — Telehealth: Payer: Self-pay | Admitting: Cardiology

## 2015-10-22 NOTE — Telephone Encounter (Signed)
New Message  Pt c/o medication issue:  1. Name of Medication: Melatonin  2. How are you currently taking this medication (dosage and times per day)? 5 mg tablet once daily  3. Are you having a reaction (difficulty breathing--STAT)? No  4. What is your medication issue? Pt voiced this medication is not working and pt is not able to sleep day to day.  Pt voiced Ambien 10 mg was took strong and it was discontinued.  Please f/u with pt

## 2015-10-22 NOTE — Telephone Encounter (Signed)
Spoke w/hospice rn she states she contacted her MD and they prescribed pt something.

## 2015-12-03 ENCOUNTER — Telehealth: Payer: Self-pay | Admitting: Cardiology

## 2015-12-03 NOTE — Telephone Encounter (Signed)
Spoke With Butch Penny at [facility] [phone]) made her aware Dr.McLean will sign d/c but to have it delivered to CHF clinic.

## 2015-12-04 ENCOUNTER — Encounter (HOSPITAL_COMMUNITY): Payer: Self-pay | Admitting: *Deleted

## 2015-12-04 NOTE — Progress Notes (Signed)
DC completed and signed by Dr Aundra Dubin, Shirley Friar aware to p/u today

## 2015-12-13 DEATH — deceased

## 2016-06-11 IMAGING — CR DG CHEST 1V PORT
1 series · 1 of 1 positions shown · non-contrast
Comparison: 05/22/2014

CLINICAL DATA: Chest pain.  Short of breath.

EXAM:
PORTABLE CHEST - 1 VIEW

[AP]
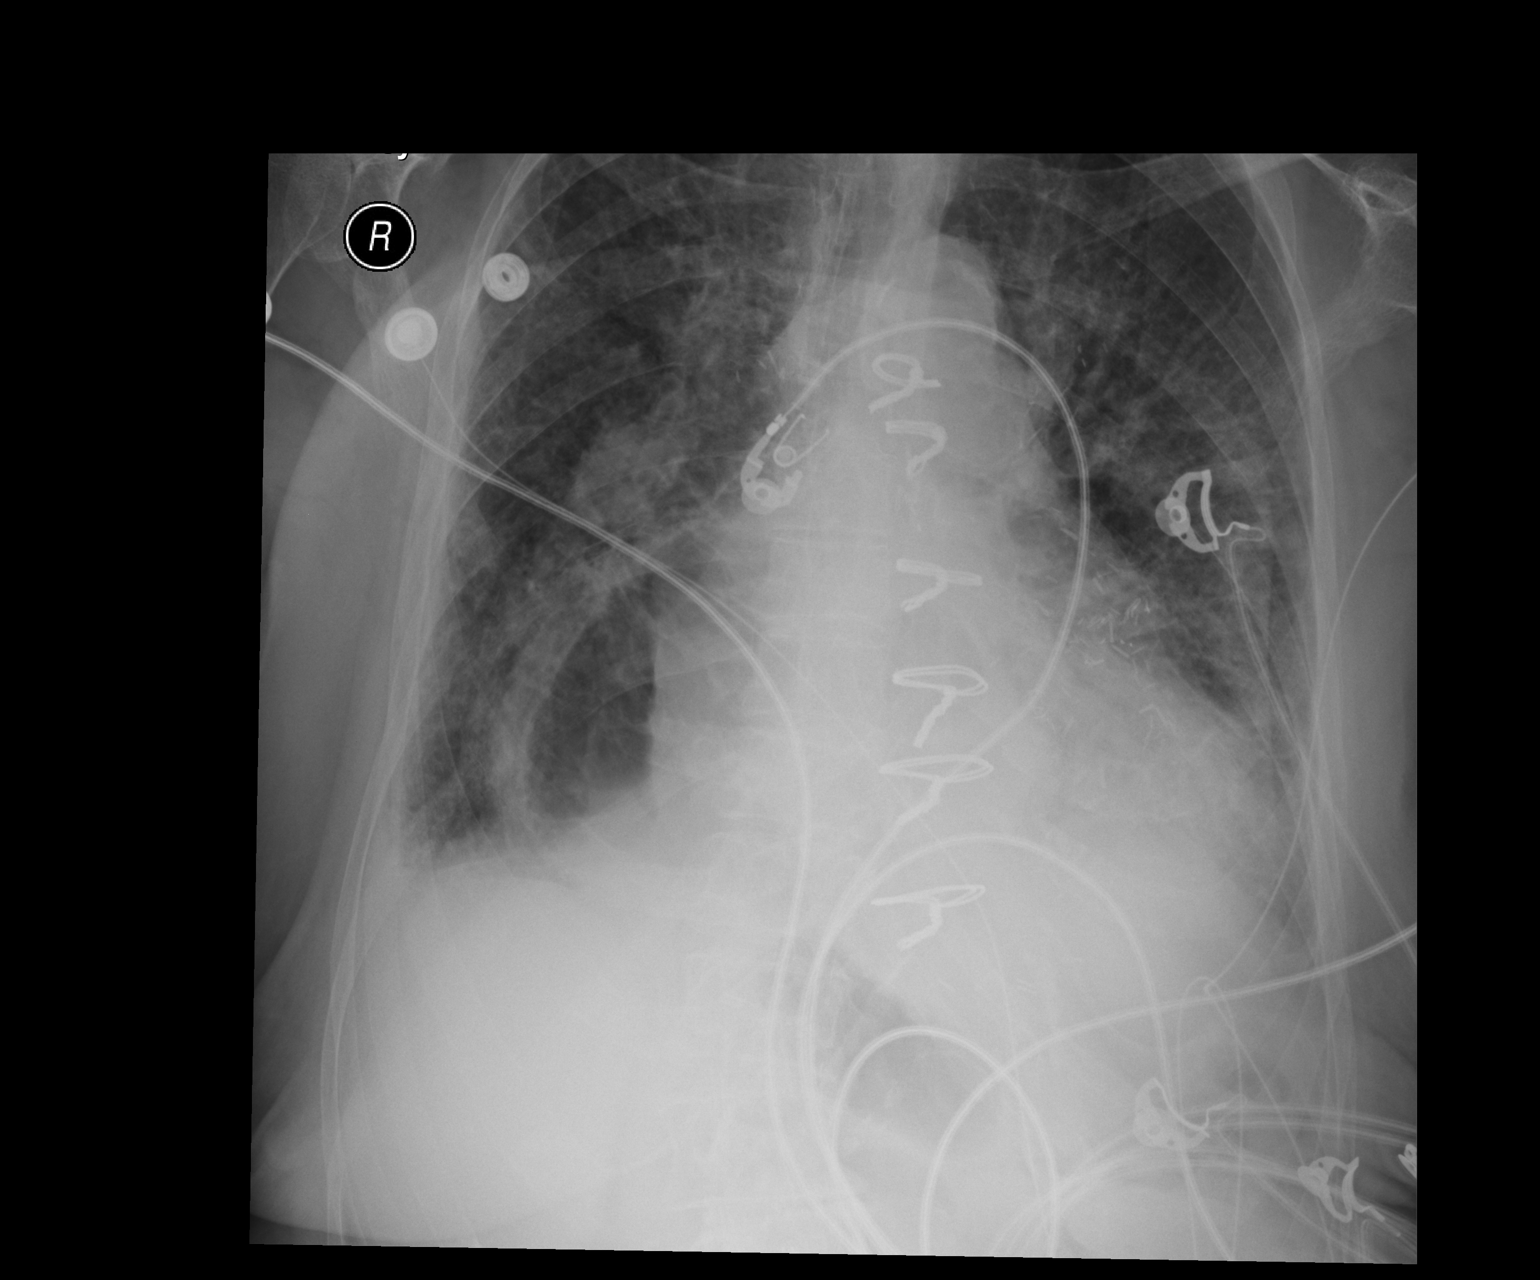

[1 of 1 positions shown; findings below may reference images not displayed]

FINDINGS: Advanced cardiomegaly is stable. Large hiatal hernia is stable.
Small pleural effusions stable. Vascular congestion and pulmonary
edema are stable. No pneumothorax.
IMPRESSION: Stable CHF.

## 2016-08-16 IMAGING — DX DG CHEST 2V
2 series · 2 of 2 positions shown · non-contrast
Comparison: 06/14/2014 and earlier, including CT chest 06/05/2014.

CLINICAL DATA: Former smoker with current history of oxygen
dependent COPD, presenting with left-sided chest pain and
paresthesias. Prior CABG.

EXAM:
CHEST  2 VIEW

[chest pa]
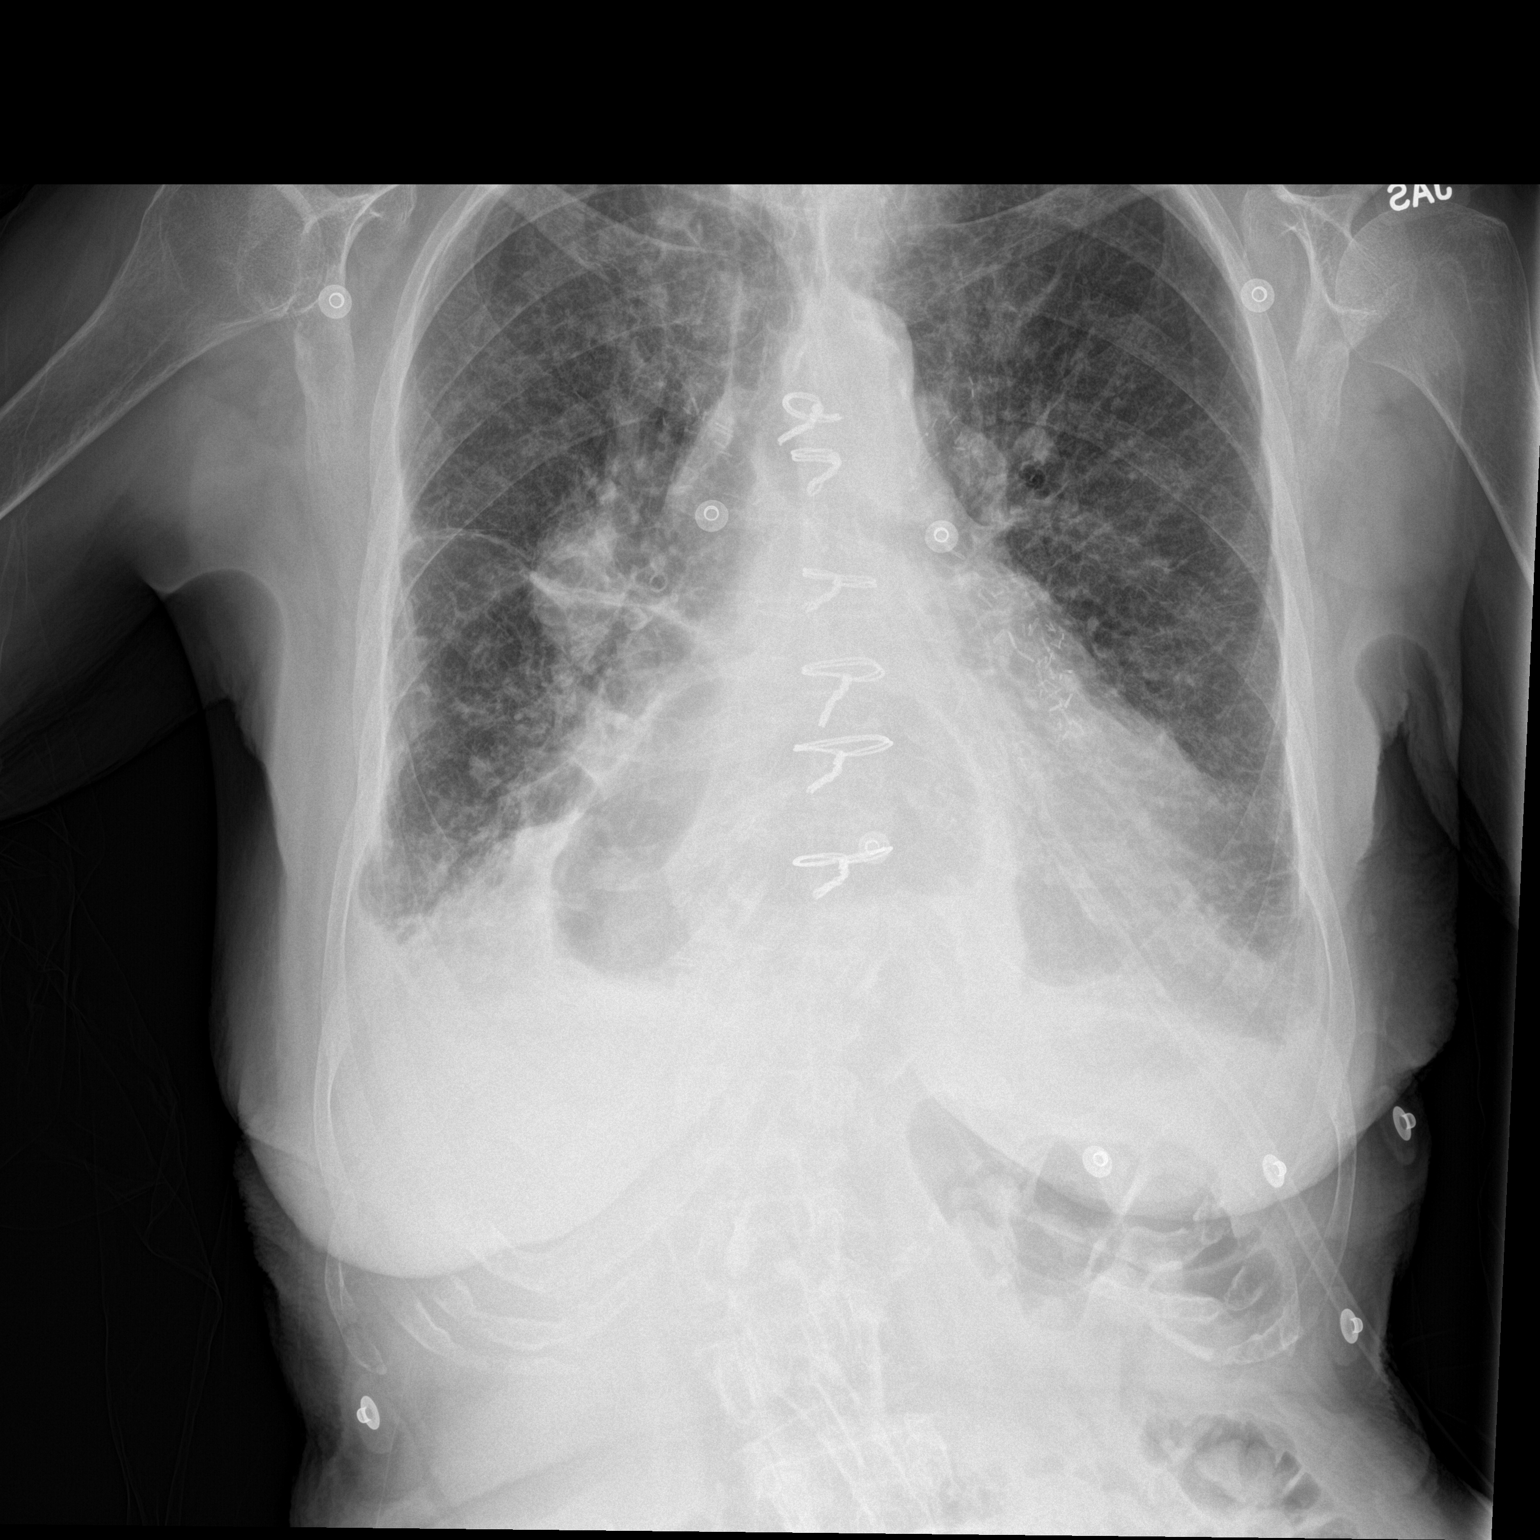

[chest lat]
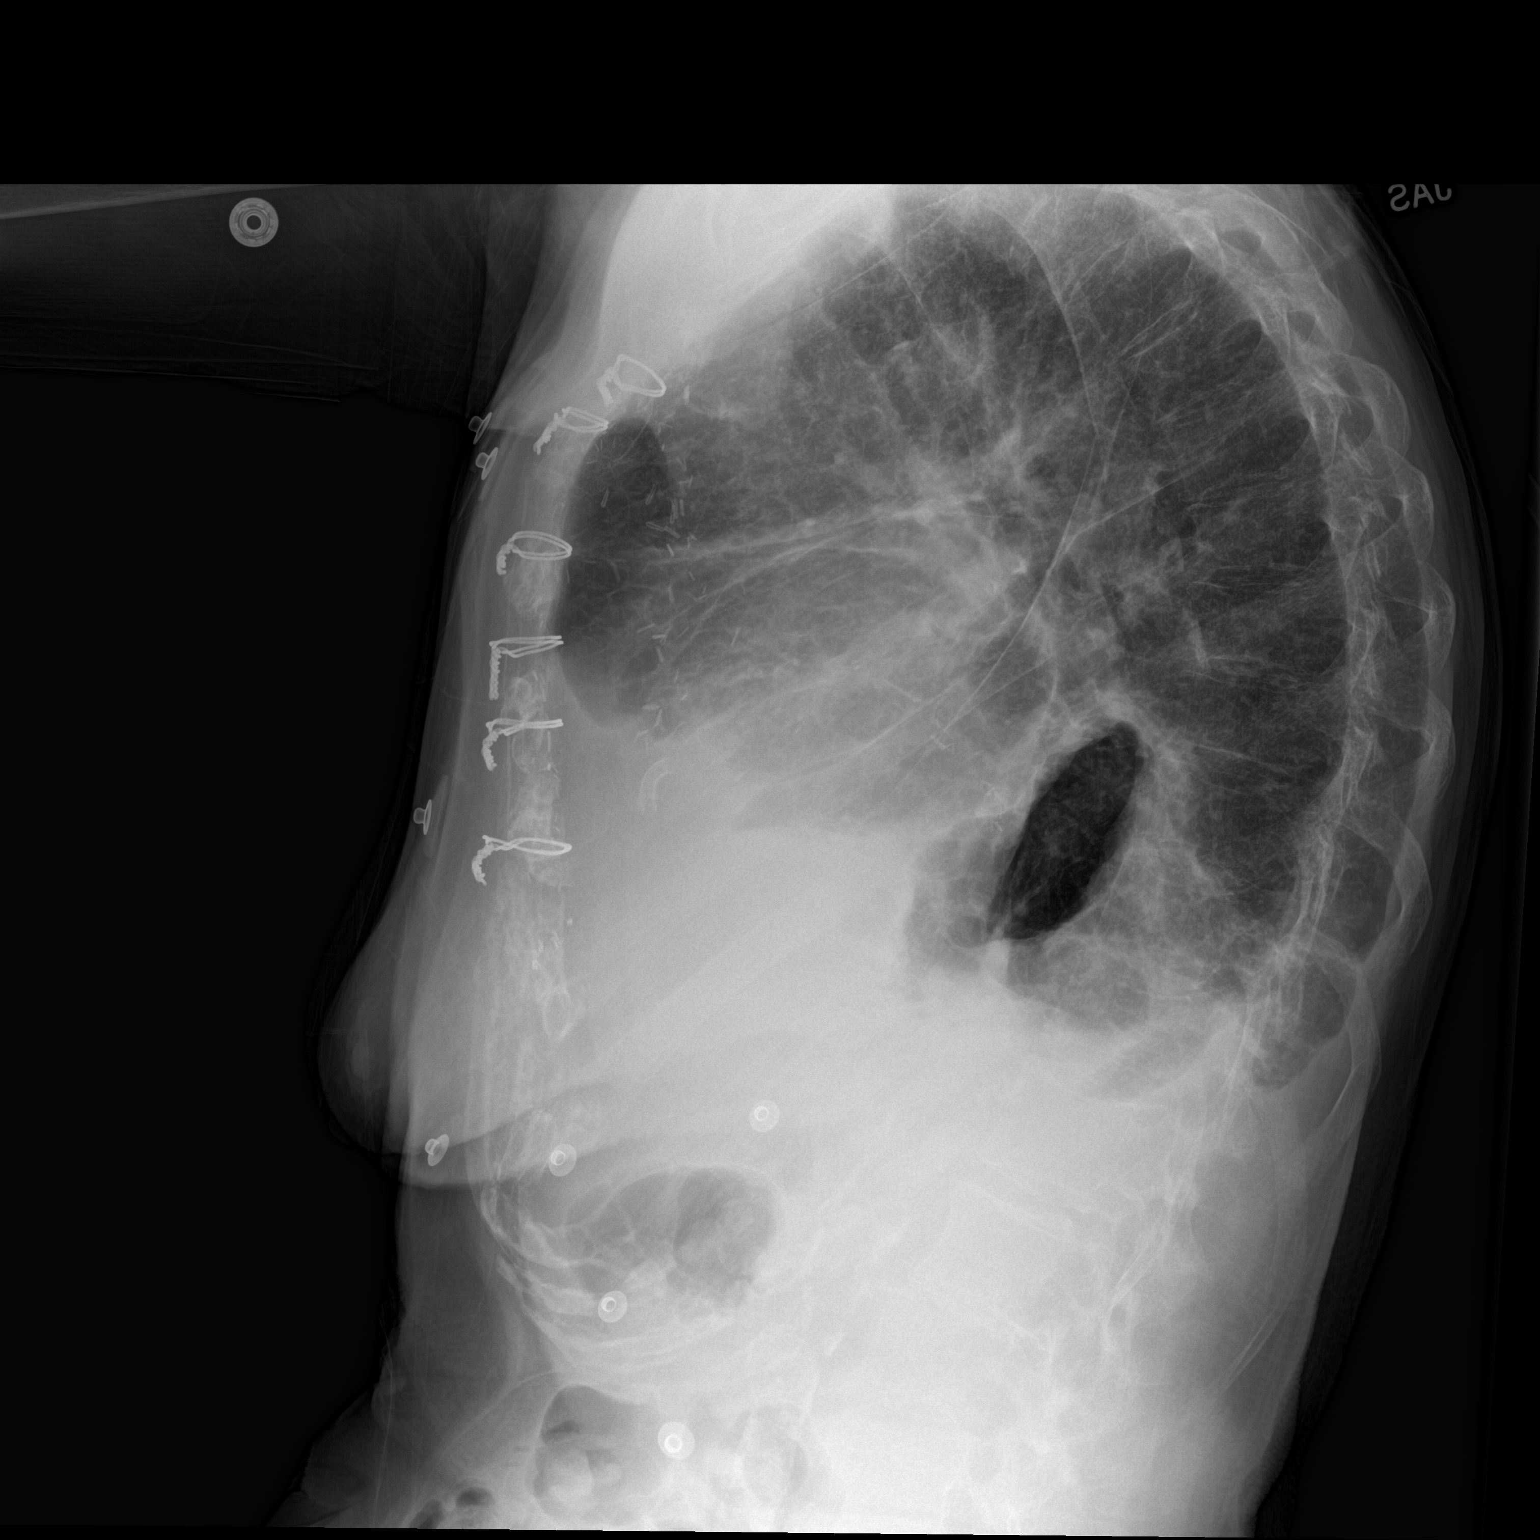

[2 of 2 positions shown; findings below may reference images not displayed]

FINDINGS: Prior sternotomy CABG. Cardiac silhouette markedly enlarged but
stable. Prominent right pulmonary artery accounting for the opacity
in the right hilum as identified on the prior CT. Large hiatal
hernia, unchanged. Mild diffuse interstitial pulmonary edema with
Kerley B-lines, new since the most recent examination 06/14/2014.
Interval increase in size of a now moderately large right pleural
effusion. Stable small left pleural effusion. New consolidation in
the anterior right lower lobe. Osseous demineralization,
thoracolumbar scoliosis, and exaggeration of the usual thoracic
kyphosis.
IMPRESSION: 1. Mild CHF, with stable marked cardiomegaly and mild diffuse
interstitial pulmonary edema.
2. Moderately large right pleural effusion, increased since
06/14/2014. Stable small left pleural effusion. (The patient has
chronic bilateral effusions. )
3. New consolidation in the anterior right lower lobe, passive
atelectasis versus pneumonia.
4. Stable large hiatal hernia.

## 2016-08-18 IMAGING — CR DG CHEST 2V
2 series · 2 of 2 positions shown · non-contrast
Comparison: 08/03/2014

CLINICAL DATA: Pleural effusion, shortness of breath

EXAM:
CHEST  2 VIEW

[chest pa]
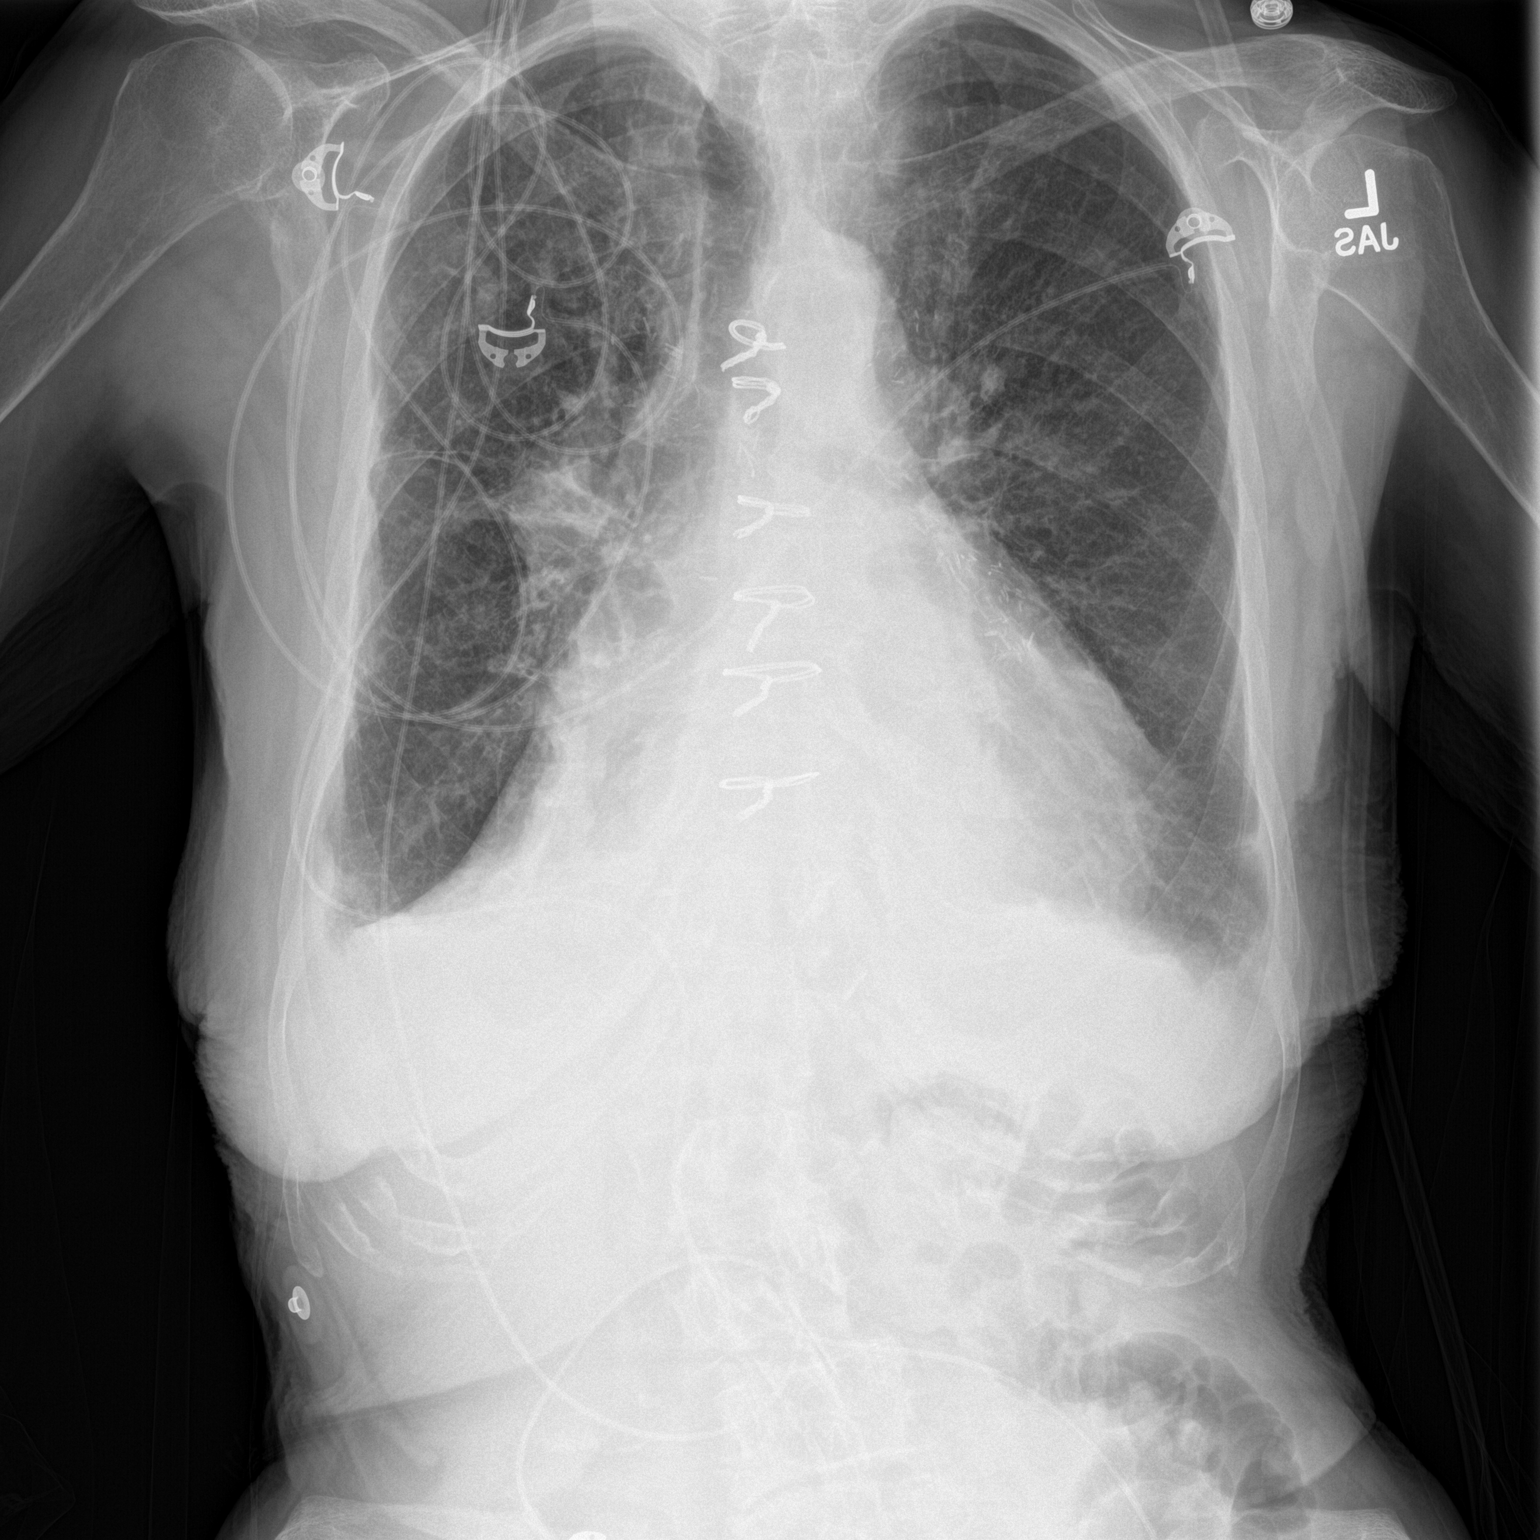

[chest lat]
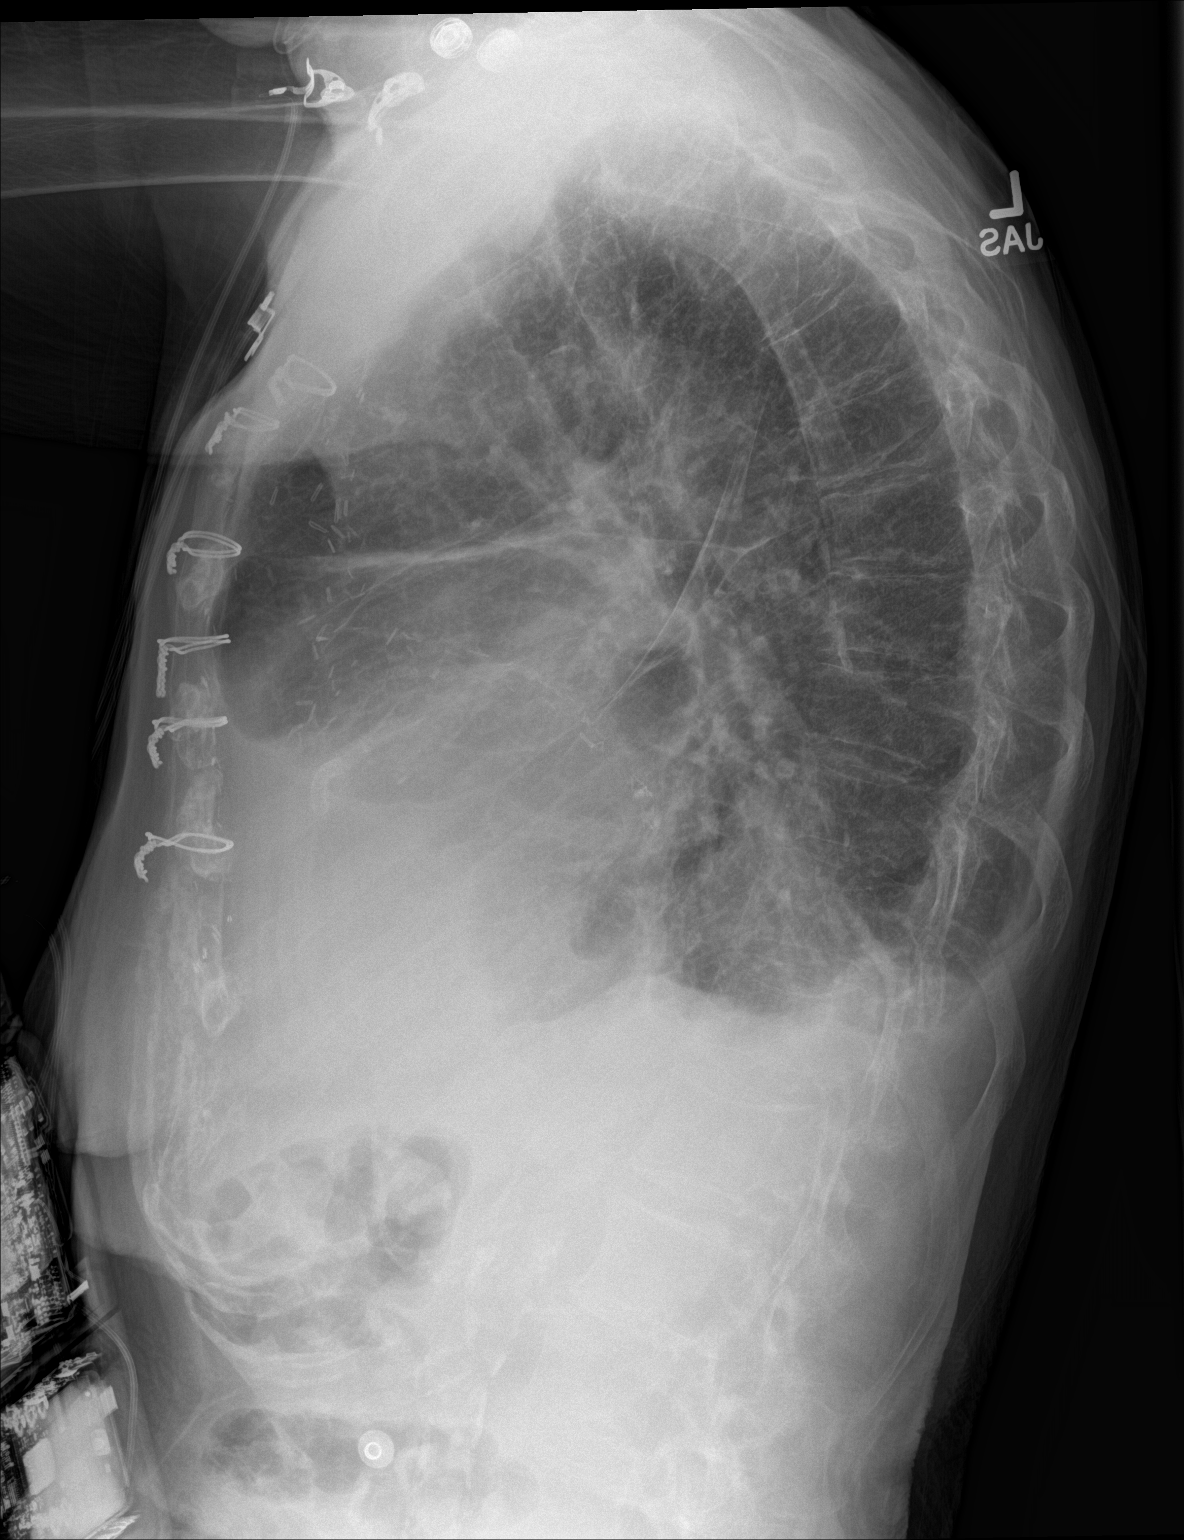

[2 of 2 positions shown; findings below may reference images not displayed]

FINDINGS: Moderate enlargement of the cardiac silhouette noted. Hyperinflation
and coarsened interstitial opacities suggest emphysema with possible
superimposed interstitial edema. Bilateral hilar prominence with
tapering may suggest pulmonary arterial hypertension. Small pleural
effusions. Bones are subjectively osteopenic. Patchy bibasilar
opacities are most likely compressive atelectasis. Atheromatous
aortic calcification noted.
IMPRESSION: Probable emphysematous change with cardiomegaly and possible
superimposed interstitial edema.

Small pleural effusions.
